# Patient Record
Sex: Female | Born: 1950
Health system: Southern US, Community
[De-identification: ages and names within clinical notes are randomized; demographics above are authoritative.]

## PROBLEM LIST (undated history)

## (undated) DIAGNOSIS — I2729 Other secondary pulmonary hypertension: Secondary | ICD-10-CM

## (undated) DIAGNOSIS — Z86010 Personal history of colonic polyps: Secondary | ICD-10-CM

## (undated) DIAGNOSIS — M1990 Unspecified osteoarthritis, unspecified site: Secondary | ICD-10-CM

## (undated) DIAGNOSIS — R918 Other nonspecific abnormal finding of lung field: Secondary | ICD-10-CM

## (undated) DIAGNOSIS — I2510 Atherosclerotic heart disease of native coronary artery without angina pectoris: Secondary | ICD-10-CM

## (undated) DIAGNOSIS — K219 Gastro-esophageal reflux disease without esophagitis: Secondary | ICD-10-CM

## (undated) DIAGNOSIS — J449 Chronic obstructive pulmonary disease, unspecified: Secondary | ICD-10-CM

## (undated) DIAGNOSIS — E785 Hyperlipidemia, unspecified: Secondary | ICD-10-CM

## (undated) DIAGNOSIS — I350 Nonrheumatic aortic (valve) stenosis: Secondary | ICD-10-CM

## (undated) DIAGNOSIS — R011 Cardiac murmur, unspecified: Secondary | ICD-10-CM

## (undated) DIAGNOSIS — I10 Essential (primary) hypertension: Secondary | ICD-10-CM

## (undated) DIAGNOSIS — Z952 Presence of prosthetic heart valve: Secondary | ICD-10-CM

## (undated) DIAGNOSIS — I340 Nonrheumatic mitral (valve) insufficiency: Secondary | ICD-10-CM

## (undated) HISTORY — DX: Chronic obstructive pulmonary disease, unspecified: J44.9

## (undated) HISTORY — DX: Gastro-esophageal reflux disease without esophagitis: K21.9

## (undated) HISTORY — DX: Hyperlipidemia, unspecified: E78.5

## (undated) HISTORY — DX: Other secondary pulmonary hypertension: I27.29

## (undated) HISTORY — DX: Nonrheumatic mitral (valve) insufficiency: I34.0

## (undated) HISTORY — DX: Cardiac murmur, unspecified: R01.1

## (undated) HISTORY — PX: CATARACT EXTRACTION W/ INTRAOCULAR LENS IMPLANT: SHX1309

## (undated) HISTORY — PX: COLONOSCOPY: SHX174

## (undated) HISTORY — PX: CHOLECYSTECTOMY OPEN: SUR202

## (undated) HISTORY — DX: Personal history of colonic polyps: Z86.010

## (undated) HISTORY — DX: Atherosclerotic heart disease of native coronary artery without angina pectoris: I25.10

## (undated) HISTORY — PX: APPENDECTOMY: SHX54

## (undated) HISTORY — PX: FRACTURE SURGERY: SHX138

## (undated) HISTORY — PX: CARDIAC VALVE REPLACEMENT: SHX585

## (undated) HISTORY — DX: Essential (primary) hypertension: I10

---
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
## 1997-08-04 ENCOUNTER — Ambulatory Visit (HOSPITAL_COMMUNITY): Admission: RE | Admit: 1997-08-04 | Discharge: 1997-08-04 | Payer: Self-pay | Admitting: Family Medicine

## 1999-02-06 ENCOUNTER — Other Ambulatory Visit: Admission: RE | Admit: 1999-02-06 | Discharge: 1999-02-06 | Payer: Self-pay | Admitting: Family Medicine

## 1999-03-06 ENCOUNTER — Ambulatory Visit (HOSPITAL_COMMUNITY): Admission: RE | Admit: 1999-03-06 | Discharge: 1999-03-06 | Payer: Self-pay | Admitting: Family Medicine

## 1999-03-06 ENCOUNTER — Encounter: Payer: Self-pay | Admitting: Family Medicine

## 1999-03-23 ENCOUNTER — Ambulatory Visit (HOSPITAL_COMMUNITY): Admission: RE | Admit: 1999-03-23 | Discharge: 1999-03-23 | Payer: Self-pay | Admitting: Gynecology

## 1999-03-23 ENCOUNTER — Encounter (INDEPENDENT_AMBULATORY_CARE_PROVIDER_SITE_OTHER): Payer: Self-pay | Admitting: Specialist

## 2000-02-04 ENCOUNTER — Other Ambulatory Visit: Admission: RE | Admit: 2000-02-04 | Discharge: 2000-02-04 | Payer: Self-pay | Admitting: Gynecology

## 2000-02-11 ENCOUNTER — Encounter: Payer: Self-pay | Admitting: Gynecology

## 2000-02-11 ENCOUNTER — Encounter: Admission: RE | Admit: 2000-02-11 | Discharge: 2000-02-11 | Payer: Self-pay | Admitting: Gynecology

## 2000-03-11 ENCOUNTER — Ambulatory Visit (HOSPITAL_COMMUNITY): Admission: RE | Admit: 2000-03-11 | Discharge: 2000-03-11 | Payer: Self-pay | Admitting: Gynecology

## 2000-03-11 ENCOUNTER — Encounter: Payer: Self-pay | Admitting: Gynecology

## 2001-02-23 ENCOUNTER — Other Ambulatory Visit: Admission: RE | Admit: 2001-02-23 | Discharge: 2001-02-23 | Payer: Self-pay | Admitting: Gynecology

## 2001-03-12 ENCOUNTER — Encounter: Payer: Self-pay | Admitting: Gynecology

## 2001-03-12 ENCOUNTER — Ambulatory Visit (HOSPITAL_COMMUNITY): Admission: RE | Admit: 2001-03-12 | Discharge: 2001-03-12 | Payer: Self-pay | Admitting: Gynecology

## 2001-10-13 ENCOUNTER — Encounter (INDEPENDENT_AMBULATORY_CARE_PROVIDER_SITE_OTHER): Payer: Self-pay | Admitting: *Deleted

## 2001-10-13 ENCOUNTER — Ambulatory Visit (HOSPITAL_COMMUNITY): Admission: RE | Admit: 2001-10-13 | Discharge: 2001-10-13 | Payer: Self-pay | Admitting: Gastroenterology

## 2004-09-10 ENCOUNTER — Emergency Department (HOSPITAL_COMMUNITY): Admission: EM | Admit: 2004-09-10 | Discharge: 2004-09-10 | Payer: Self-pay | Admitting: Emergency Medicine

## 2006-03-11 ENCOUNTER — Other Ambulatory Visit: Admission: RE | Admit: 2006-03-11 | Discharge: 2006-03-11 | Payer: Self-pay | Admitting: Internal Medicine

## 2006-03-22 ENCOUNTER — Inpatient Hospital Stay (HOSPITAL_COMMUNITY): Admission: EM | Admit: 2006-03-22 | Discharge: 2006-03-25 | Payer: Self-pay | Admitting: Emergency Medicine

## 2007-02-04 ENCOUNTER — Ambulatory Visit (HOSPITAL_BASED_OUTPATIENT_CLINIC_OR_DEPARTMENT_OTHER): Admission: RE | Admit: 2007-02-04 | Discharge: 2007-02-04 | Payer: Self-pay | Admitting: Orthopedic Surgery

## 2009-03-25 HISTORY — PX: FRACTURE SURGERY: SHX138

## 2009-08-20 ENCOUNTER — Inpatient Hospital Stay (HOSPITAL_COMMUNITY): Admission: EM | Admit: 2009-08-20 | Discharge: 2009-08-22 | Payer: Self-pay | Admitting: Emergency Medicine

## 2009-10-23 ENCOUNTER — Encounter: Admission: RE | Admit: 2009-10-23 | Discharge: 2009-11-15 | Payer: Self-pay | Admitting: Orthopedic Surgery

## 2009-11-28 ENCOUNTER — Ambulatory Visit (HOSPITAL_COMMUNITY): Admission: RE | Admit: 2009-11-28 | Discharge: 2009-11-29 | Payer: Self-pay | Admitting: Orthopedic Surgery

## 2009-12-13 ENCOUNTER — Encounter
Admission: RE | Admit: 2009-12-13 | Discharge: 2010-03-13 | Payer: Self-pay | Source: Home / Self Care | Attending: Orthopedic Surgery | Admitting: Orthopedic Surgery

## 2010-06-07 LAB — TYPE AND SCREEN: ABO/RH(D): A NEG

## 2010-06-08 LAB — BASIC METABOLIC PANEL
Calcium: 9.7 mg/dL (ref 8.4–10.5)
Chloride: 107 mEq/L (ref 96–112)
Glucose, Bld: 90 mg/dL (ref 70–99)
Sodium: 141 mEq/L (ref 135–145)

## 2010-06-08 LAB — URINALYSIS, ROUTINE W REFLEX MICROSCOPIC
Bilirubin Urine: NEGATIVE
Hgb urine dipstick: NEGATIVE
Nitrite: NEGATIVE
Protein, ur: NEGATIVE mg/dL
Specific Gravity, Urine: 1.013 (ref 1.005–1.030)

## 2010-06-08 LAB — CBC
HCT: 40.7 % (ref 36.0–46.0)
MCHC: 33.5 g/dL (ref 30.0–36.0)
MCV: 92 fL (ref 78.0–100.0)
Platelets: 245 10*3/uL (ref 150–400)
RBC: 4.43 MIL/uL (ref 3.87–5.11)
WBC: 10.6 10*3/uL — ABNORMAL HIGH (ref 4.0–10.5)

## 2010-06-08 LAB — SURGICAL PCR SCREEN
MRSA, PCR: NEGATIVE
Staphylococcus aureus: NEGATIVE

## 2010-06-08 LAB — DIFFERENTIAL
Basophils Absolute: 0.1 10*3/uL (ref 0.0–0.1)
Eosinophils Absolute: 0.6 10*3/uL (ref 0.0–0.7)
Eosinophils Relative: 5 % (ref 0–5)
Lymphs Abs: 2.9 10*3/uL (ref 0.7–4.0)
Neutro Abs: 6.4 10*3/uL (ref 1.7–7.7)

## 2010-06-08 LAB — APTT: aPTT: 36 seconds (ref 24–37)

## 2010-06-08 LAB — PROTIME-INR: Prothrombin Time: 13.4 seconds (ref 11.6–15.2)

## 2010-06-11 LAB — DIFFERENTIAL
Basophils Relative: 0 % (ref 0–1)
Eosinophils Absolute: 0.1 10*3/uL (ref 0.0–0.7)
Lymphs Abs: 2 10*3/uL (ref 0.7–4.0)
Monocytes Absolute: 0.6 10*3/uL (ref 0.1–1.0)
Neutro Abs: 16.3 10*3/uL — ABNORMAL HIGH (ref 1.7–7.7)

## 2010-06-11 LAB — BASIC METABOLIC PANEL
BUN: 11 mg/dL (ref 6–23)
BUN: 9 mg/dL (ref 6–23)
CO2: 23 mEq/L (ref 19–32)
Calcium: 8.5 mg/dL (ref 8.4–10.5)
Chloride: 107 mEq/L (ref 96–112)
Creatinine, Ser: 0.72 mg/dL (ref 0.4–1.2)
GFR calc Af Amer: >60 mL/min (ref >60)
GFR calc non Af Amer: >60 mL/min (ref >60)
Potassium: 3.6 mEq/L (ref 3.5–5.1)
Sodium: 133 mEq/L — ABNORMAL LOW (ref 135–145)

## 2010-06-11 LAB — URINALYSIS, ROUTINE W REFLEX MICROSCOPIC
Glucose, UA: NEGATIVE mg/dL
Ketones, ur: NEGATIVE mg/dL
Protein, ur: NEGATIVE mg/dL
Urobilinogen, UA: 0.2 mg/dL (ref 0.0–1.0)

## 2010-06-11 LAB — SAMPLE TO BLOOD BANK

## 2010-06-11 LAB — CBC
HCT: 33.6 % — ABNORMAL LOW (ref 36.0–46.0)
HCT: 38 % (ref 36.0–46.0)
Hemoglobin: 12.5 g/dL (ref 12.0–15.0)
MCHC: 33.5 g/dL (ref 30.0–36.0)
MCHC: 34 g/dL (ref 30.0–36.0)
MCV: 92.8 fL (ref 78.0–100.0)
MCV: 93.9 fL (ref 78.0–100.0)
MCV: 94.2 fL (ref 78.0–100.0)
Platelets: 190 10*3/uL (ref 150–400)
Platelets: 235 10*3/uL (ref 150–400)
WBC: 10.3 10*3/uL (ref 4.0–10.5)
WBC: 14.1 10*3/uL — ABNORMAL HIGH (ref 4.0–10.5)

## 2010-06-11 LAB — APTT: aPTT: 33 seconds (ref 24–37)

## 2010-06-11 LAB — URINE CULTURE: Culture: NO GROWTH

## 2010-08-07 NOTE — Op Note (Signed)
NAMECLARABEL, Alexandra [name]                ACCOUNT NO.:  [id number]   MEDICAL RECORD NO.:  [id number]          PATIENT TYPE:  AMB   LOCATION:  [facility]                         FACILITY:  [facility]   PHYSICIAN:  Ollen Gross, M.D.    DATE OF BIRTH:  1950/06/07   DATE OF PROCEDURE:  02/04/2007  DATE OF DISCHARGE:                               OPERATIVE REPORT   PREOPERATIVE DIAGNOSIS:  Painful hardware, right ankle.   POSTOPERATIVE DIAGNOSIS:  Painful hardware, right ankle.   PROCEDURE:  Right ankle hardware removal.   SURGEON:  Ollen Gross, M.D.   ASSISTANT:  None.   ANESTHESIA:  General.   ESTIMATED BLOOD LOSS:  Minimal.   DRAINS:  None.   TOURNIQUET TIME:  14 minutes at 350 mmHg.   COMPLICATIONS:  None.   CONDITION:  Stable to recovery.   BRIEF CLINICAL NOTE:  Ms. Mom is a 60 year old female had a bad tib-  fib fracture about 10 months ago.  She had intramedullary nailing of her  tibia as well as plate fixation of her lateral malleolus.  She healed  uneventfully.  She has been having a lot of pain related to the hardware  both medial and lateral.  She is tender over the lateral plate and  tender over the medial distal interlock screws for the tibial nail.  She  presents now for hardware removal.   PROCEDURE IN DETAIL:  After the successful administration of general  anesthetic, tourniquet placed high on the right thigh, right lower  extremity prepped and draped in the usual sterile fashion.  Extremity  was wrapped in Esmarch and tourniquet inflated 350 mmHg.  I addressed  the medial side first cutting through her previous tiny medial incisions  distally in the lower leg just proximal to the medial malleolus.  Skin  cut with a 15 blade through subcutaneous tissue and the screws are  palpable.  Each screw was removed with the screwdriver for the Ace  tibial nail.  I then addressed the lateral side and going through the  previous incision on the lateral plate.  Six screws were  identified and  removed and then the plate is removed.  There is a tiny bit of metal  stained tissue which is also  excised.  I copiously irrigated both the medial and lateral wounds,  closed subcu with interrupted 2-0 Vicryl and skin with staples.  The  tourniquet was then released with total time of 14 minutes.  Bulky  sterile dressing is applied.  She is awakened and transported to  recovery in stable condition.      Ollen Gross, M.D.  Electronically Signed     FA/MEDQ  D:  02/04/2007  T:  02/05/2007  Job:  [id number]

## 2010-08-10 NOTE — Procedures (Signed)
[facility]  Patient:    Alexandra [name], Alexandra [name] Visit Number: [id number] MRN: [id number]          Service Type: END Location: ENDO Attending Physician:  Louie Bun Dictated by:   Everardo All Madilyn Fireman, M.D. Admit Date:  10/13/2001 Discharge Date: 10/13/2001   CC:         Dr. Lesle Chris   Procedure Report  PROCEDURE:  Colonoscopy with polypectomy.  SURGEON:  John C. Madilyn Fireman, M.D.  INDICATIONS FOR PROCEDURE:  Heme positive stool.  DESCRIPTION OF PROCEDURE:  The patient was placed in the left lateral decubitus position and placed on the pulse monitor with continuous low flow oxygen delivered by nasal cannula.  She was sedated with 120 mg of IV Demerol and 12 mg of IV Versed.  The Olympus video colonoscope was inserted into the rectum and advanced to the cecum, confirmed by transillumination at McBurneys point, and visualization of the ileocecal valve and appendiceal orifice.  The prep was good.  The cecum, ascending, transverse, descending, and sigmoid colon all appeared normal with no masses, polyps, diverticula, or other mucosal abnormalities.  Within the rectum, there was seen an 8 mm sessile polyp at 20 cm and this was fulgurated by hot biopsy.  The remainder of the rectum appeared normal.  The scope was then withdrawn and the patient returned to the recovery room in stable condition.  She tolerated the procedure well and there were no immediate complications.  IMPRESSION:  An 8 mm rectal polyp, fulgurated by hot biopsy.  PLAN:  Will await histology to determine method and interval for future colon screening, and consider repeating Hemoccults in three to four weeks. Dictated by:   Everardo All Madilyn Fireman, M.D. Attending Physician:  Louie Bun DD:  10/13/01 TD:  10/16/01 Job: [id number]

## 2010-08-10 NOTE — Discharge Summary (Signed)
Alexandra [name]                ACCOUNT NO.:  [id number]   MEDICAL RECORD NO.:  [id number]          PATIENT TYPE:  INP   LOCATION:  1512                         FACILITY:  [facility]   PHYSICIAN:  Ollen Gross, M.D.    DATE OF BIRTH:  [birth year]   DATE OF ADMISSION:  03/22/2006  DATE OF DISCHARGE:  03/25/2006                               DISCHARGE SUMMARY   ADMISSION DIAGNOSES:  1. Right tibia/fibula fracture.  2. Hypertension.   DISCHARGE DIAGNOSES:  1. Right tibial diaphyseal fracture with right distal fibula fracture,      status post intramedullary nailing, right tibial and an open      reduction, internal fixation of right fibula.  2. Hypertension.   PROCEDURE:  On March 22, 2006, an intramedullary nailing of right  tibia with an ORIF of right fibula.  Surgeon was Dr. Ollen Gross,  assistant was Alexandra [name], P.A.-C.   CONSULTATIONS:  None.   HISTORY:  Alexandra [name] is a 60 year old female who tripped over her dog at  her house on the date of admission, sustaining immediate pain and  subsequent fracture.  She was seen in the emergency room and found to  have fractures and admitted for surgical intervention.   LABORATORY DATA:  CBC:  Hemoglobin 13.8, hematocrit 40.7.  PT on  admission 14.3, INR 1.1.  Pro-time was followed.  the INR before  discharge was 1.5.  BMET on admission all within normal limits with the  exception of minimally elevated glucose of 115.   A preoperative electrocardiogram on March 22, 2006, revealed a normal  sinus rhythm, normal electrocardiogram.   Preoperative films showed a distal tibial fracture, slightly displaced,  with a comminuted distal fibula fracture.  Intraoperative films showed  expected parous status, post ORIF of right distal fibula and tibia  fractures.   HOSPITAL COURSE:  The patient was admitted to [facility] for  the above-stated problem.  She was later taken to the operating room  later that evening,  and underwent the above procedure without  complications.  The patient tolerated the procedure well.  She was later  transferred to the recovery room and then to the orthopedic floor.  She  was started on a PCA and then p.o. pain control following surgery.  By  day one postoperatively PT got involved.  Did elevate the leg on  pillows.  Recommended ice and elevation.  Started getting up with PT, up  out of bed.  Slowly increased mobility.  By day two she was weaned over  to p.o. medications.  The PCA was discontinued.  The splint looked good.  She had excellent urinary output.  From a therapy standpoint, she  actually got up and walked about 90 feet that day.   Discharge planning assisted in arrangements of home care.  She  progressed well and was ready to go home by the following day of March 25, 2006.   DISPOSITION:  The patient is discharged home on March 25, 2006.   DISCHARGE MEDICATIONS:  1. Dilaudid p.o.  2. Valium.  3. Coumadin.  DIET:  As tolerated.   ACTIVITY:  She is status weightbearing on her right lower extremity.  Elevate and ice when not up ambulating.  Home PT and home nursing.   FOLLOWUP:  Follow up in two weeks.   CONDITION ON DISCHARGE:  Improved.      Alexandra [name], P.A.      Ollen Gross, M.D.  Electronically Signed    ALP/MEDQ  D:  05/06/2006  T:  05/06/2006  Job:  [id number]

## 2010-08-10 NOTE — Op Note (Signed)
NAMESUMAYYAH, CUSTODIO                ACCOUNT NO.:  [id number]   MEDICAL RECORD NO.:  [id number]          PATIENT TYPE:  INP   LOCATION:  0103                         FACILITY:  [facility]   PHYSICIAN:  Ollen Gross, M.D.    DATE OF BIRTH:  05-Feb-1951   DATE OF PROCEDURE:  03/22/2006  DATE OF DISCHARGE:                               OPERATIVE REPORT   PREOPERATIVE DIAGNOSIS:  Right tibia diaphyseal fracture with right  distal fibula fracture.   POSTOPERATIVE DIAGNOSIS:  Right tibia diaphyseal fracture with right  distal fibula fracture.   PROCEDURES:  1. Intramedullary nailing right tibia.  2. Open reduction and internal fixation right fibula.   SURGEON:  Dr. Lequita Halt.   ASSISTANT:  Avel Peace.   ANESTHESIA:  General.   ESTIMATED BLOOD LOSS:  Minimal.   DRAINS:  None.   TOURNIQUET TIME:  57 minutes at 300 mmHg.   COMPLICATIONS:  None.   CONDITION:  Stable to recovery.   BRIEF CLINICAL NOTE:  Ms. Kamen is a 60 year old female who tripped  over her dog at her house today and fell, sustaining immediate pain and  deformity to the right lower leg.  She had a comminuted fracture of the  tibia, the junction of the middle and distal third, as well as a lateral  malleolar fracture which was displaced and comminuted.  She presents now  for open reduction and internal fixation of fibula and intramedullary  nailing of tibia.   PROCEDURE IN DETAIL:  After successful administration of general  anesthetic, tourniquet was placed high on the right thigh and right  lower extremity prepped and draped in usual sterile fashion.  Right  lower extremity was wrapped in Esmarch, tourniquet inflated to 300 mmHg.  Incision was made medial to the patellar tendon.  The skin was cut with  10 blade through subcutaneous tissue and then a small incision made just  adjacent to the medial border of the patellar tendon.  Guide pin for the  tibial nail was placed so as to enter the center of the proximal  tibia.  Pin is passed and shown to be going directly down the medullary canal.  Proximal reamer was passed over the guide pin.  Guide pin was removed  and then a long beaded guide wire is placed.  It is passed across the  fracture site and into the distal fragment and confirmed to be in the  distal fragment, both on AP and lateral views.  We reamed up to 12 mm  for placement of an 11 mm nail.  The length was 34.5 cm.  The Ace tibial  nail was then passed over the guide pin and impacted so that the tip of  the nail was at the physeal scar of the distal tibia.  Through the  proximal guide, the oblique proximal screw was passed and this was 50 mm  in length.  This passed through a small incision.  We obtained excellent  alignment of this comminuted fracture, both on AP and lateral planes.  Rotation is restored back to her normal rotation.  On the lateral view  we then passed two distal interlocks, using a freehand technique through  small incisions.  They are found be through the nail on the lateral view  and of appropriate length on the AP view.  At this point tibial nail  portion is completed.  I then made an incision over the lateral  malleolus with the 10 blade, through subcutaneous tissue to the lateral  malleolus.  The patient had a comminuted fracture with significant  comminution at the fracture site.  We were able to obtain excellent  alignment, but due to the gross comminution could not get perfect  reduction.  I placed a 7-hole, 1/3 tubular tibial plate, putting one  proximal cortical screw and one distal cancellus screw and took multiple  views, showing excellent alignment on AP and lateral planes.  We placed  two more proximal cortical screws and one more distal cortical and one  more distal cancellus screw; a total six screws through the plate.  I  was very pleased with the alignment on AP and lateral views.  The wounds  were then copiously irrigated with saline solution.   Subcutaneous  tissues closed interrupted 2-0 Vicryl and tourniquet released with total  time of 57 minutes.  Skin was closed with staples and a bulky sterile  dressing and posterior splint were applied.  She was then awakened and  transferred to recovery in stable condition.      Ollen Gross, M.D.  Electronically Signed     FA/MEDQ  D:  03/22/2006  T:  03/23/2006  Job:  [id number]

## 2010-08-21 ENCOUNTER — Emergency Department (HOSPITAL_COMMUNITY): Payer: No Typology Code available for payment source

## 2010-08-21 ENCOUNTER — Emergency Department (HOSPITAL_COMMUNITY)
Admission: EM | Admit: 2010-08-21 | Discharge: 2010-08-21 | Disposition: A | Discharge status: Home / Self Care | Payer: No Typology Code available for payment source | Attending: Emergency Medicine | Admitting: Emergency Medicine

## 2010-08-21 DIAGNOSIS — S20219A Contusion of unspecified front wall of thorax, initial encounter: Secondary | ICD-10-CM | POA: Insufficient documentation

## 2010-08-21 DIAGNOSIS — R079 Chest pain, unspecified: Secondary | ICD-10-CM | POA: Insufficient documentation

## 2010-08-21 LAB — CK TOTAL AND CKMB (NOT AT ARMC)
Relative Index: INVALID (ref 0.0–2.5)
Total CK: 72 U/L (ref 7–177)

## 2010-08-24 ENCOUNTER — Ambulatory Visit
Admission: RE | Admit: 2010-08-24 | Discharge: 2010-08-24 | Disposition: A | Discharge status: Home / Self Care | Payer: No Typology Code available for payment source | Source: Ambulatory Visit | Attending: Family Medicine | Admitting: Family Medicine

## 2010-08-24 ENCOUNTER — Other Ambulatory Visit: Payer: Self-pay | Admitting: Family Medicine

## 2010-08-24 DIAGNOSIS — R509 Fever, unspecified: Secondary | ICD-10-CM

## 2012-12-10 ENCOUNTER — Ambulatory Visit
Admission: RE | Admit: 2012-12-10 | Discharge: 2012-12-10 | Disposition: A | Discharge status: Home / Self Care | Payer: Medicaid Other | Source: Ambulatory Visit | Attending: Family Medicine | Admitting: Family Medicine

## 2012-12-10 ENCOUNTER — Other Ambulatory Visit: Payer: Self-pay | Admitting: Family Medicine

## 2012-12-10 ENCOUNTER — Other Ambulatory Visit: Payer: Self-pay | Admitting: *Deleted

## 2012-12-10 DIAGNOSIS — R55 Syncope and collapse: Secondary | ICD-10-CM

## 2012-12-10 DIAGNOSIS — R002 Palpitations: Secondary | ICD-10-CM

## 2012-12-10 DIAGNOSIS — I10 Essential (primary) hypertension: Secondary | ICD-10-CM

## 2012-12-10 DIAGNOSIS — I359 Nonrheumatic aortic valve disorder, unspecified: Secondary | ICD-10-CM

## 2012-12-11 ENCOUNTER — Other Ambulatory Visit: Payer: Self-pay

## 2012-12-14 ENCOUNTER — Ambulatory Visit
Admission: RE | Admit: 2012-12-14 | Discharge: 2012-12-14 | Disposition: A | Discharge status: Home / Self Care | Payer: Medicaid Other | Source: Ambulatory Visit | Attending: Family Medicine | Admitting: Family Medicine

## 2012-12-14 DIAGNOSIS — R55 Syncope and collapse: Secondary | ICD-10-CM

## 2012-12-17 ENCOUNTER — Encounter: Payer: Self-pay | Admitting: *Deleted

## 2012-12-17 ENCOUNTER — Encounter (INDEPENDENT_AMBULATORY_CARE_PROVIDER_SITE_OTHER): Payer: Medicaid Other

## 2012-12-17 ENCOUNTER — Ambulatory Visit (HOSPITAL_COMMUNITY): Payer: Medicaid Other | Attending: Cardiology | Admitting: Radiology

## 2012-12-17 DIAGNOSIS — R55 Syncope and collapse: Secondary | ICD-10-CM

## 2012-12-17 DIAGNOSIS — I059 Rheumatic mitral valve disease, unspecified: Secondary | ICD-10-CM | POA: Insufficient documentation

## 2012-12-17 DIAGNOSIS — I359 Nonrheumatic aortic valve disorder, unspecified: Secondary | ICD-10-CM | POA: Insufficient documentation

## 2012-12-17 DIAGNOSIS — I10 Essential (primary) hypertension: Secondary | ICD-10-CM

## 2012-12-17 DIAGNOSIS — I379 Nonrheumatic pulmonary valve disorder, unspecified: Secondary | ICD-10-CM | POA: Insufficient documentation

## 2012-12-17 DIAGNOSIS — I079 Rheumatic tricuspid valve disease, unspecified: Secondary | ICD-10-CM | POA: Insufficient documentation

## 2012-12-17 DIAGNOSIS — F17200 Nicotine dependence, unspecified, uncomplicated: Secondary | ICD-10-CM | POA: Insufficient documentation

## 2012-12-17 NOTE — Progress Notes (Signed)
Echocardiogram performed.  

## 2012-12-17 NOTE — Progress Notes (Signed)
Patient ID: Alexandra [name], female   DOB: 17-Jul-1950, 62 y.o.   MRN: [id number] E-Cardio 24 hour holter monitor applied to patient.

## 2012-12-19 ENCOUNTER — Ambulatory Visit
Admission: RE | Admit: 2012-12-19 | Discharge: 2012-12-19 | Disposition: A | Discharge status: Home / Self Care | Payer: Medicaid Other | Source: Ambulatory Visit | Attending: Family Medicine | Admitting: Family Medicine

## 2014-04-11 DIAGNOSIS — I10 Essential (primary) hypertension: Secondary | ICD-10-CM | POA: Diagnosis not present

## 2014-04-11 DIAGNOSIS — G894 Chronic pain syndrome: Secondary | ICD-10-CM | POA: Diagnosis not present

## 2014-04-11 DIAGNOSIS — Z23 Encounter for immunization: Secondary | ICD-10-CM | POA: Diagnosis not present

## 2014-04-11 DIAGNOSIS — M160 Bilateral primary osteoarthritis of hip: Secondary | ICD-10-CM | POA: Diagnosis not present

## 2014-04-11 DIAGNOSIS — F17200 Nicotine dependence, unspecified, uncomplicated: Secondary | ICD-10-CM | POA: Diagnosis not present

## 2014-05-12 DIAGNOSIS — F419 Anxiety disorder, unspecified: Secondary | ICD-10-CM | POA: Diagnosis not present

## 2014-05-12 DIAGNOSIS — F17200 Nicotine dependence, unspecified, uncomplicated: Secondary | ICD-10-CM | POA: Diagnosis not present

## 2014-05-12 DIAGNOSIS — G894 Chronic pain syndrome: Secondary | ICD-10-CM | POA: Diagnosis not present

## 2014-05-12 DIAGNOSIS — I10 Essential (primary) hypertension: Secondary | ICD-10-CM | POA: Diagnosis not present

## 2014-06-09 DIAGNOSIS — M160 Bilateral primary osteoarthritis of hip: Secondary | ICD-10-CM | POA: Diagnosis not present

## 2014-06-09 DIAGNOSIS — I10 Essential (primary) hypertension: Secondary | ICD-10-CM | POA: Diagnosis not present

## 2014-06-09 DIAGNOSIS — G894 Chronic pain syndrome: Secondary | ICD-10-CM | POA: Diagnosis not present

## 2014-06-09 DIAGNOSIS — F17210 Nicotine dependence, cigarettes, uncomplicated: Secondary | ICD-10-CM | POA: Diagnosis not present

## 2014-07-11 DIAGNOSIS — S60940S Unspecified superficial injury of right index finger, sequela: Secondary | ICD-10-CM | POA: Diagnosis not present

## 2014-07-11 DIAGNOSIS — L089 Local infection of the skin and subcutaneous tissue, unspecified: Secondary | ICD-10-CM | POA: Diagnosis not present

## 2014-07-11 DIAGNOSIS — B369 Superficial mycosis, unspecified: Secondary | ICD-10-CM | POA: Diagnosis not present

## 2014-07-11 DIAGNOSIS — G894 Chronic pain syndrome: Secondary | ICD-10-CM | POA: Diagnosis not present

## 2014-07-11 DIAGNOSIS — F17210 Nicotine dependence, cigarettes, uncomplicated: Secondary | ICD-10-CM | POA: Diagnosis not present

## 2014-07-29 ENCOUNTER — Ambulatory Visit (INDEPENDENT_AMBULATORY_CARE_PROVIDER_SITE_OTHER): Payer: Medicare HMO

## 2014-07-29 ENCOUNTER — Ambulatory Visit (INDEPENDENT_AMBULATORY_CARE_PROVIDER_SITE_OTHER): Payer: Medicare HMO | Admitting: Internal Medicine

## 2014-07-29 VITALS — BP 144/72 | HR 74 | Temp 97.9°F | Resp 20 | Ht 66.0 in | Wt 157.1 lb

## 2014-07-29 DIAGNOSIS — M79644 Pain in right finger(s): Secondary | ICD-10-CM | POA: Diagnosis not present

## 2014-07-29 DIAGNOSIS — I998 Other disorder of circulatory system: Secondary | ICD-10-CM

## 2014-07-29 DIAGNOSIS — I999 Unspecified disorder of circulatory system: Secondary | ICD-10-CM

## 2014-07-29 DIAGNOSIS — I080 Rheumatic disorders of both mitral and aortic valves: Secondary | ICD-10-CM

## 2014-07-29 DIAGNOSIS — E785 Hyperlipidemia, unspecified: Secondary | ICD-10-CM | POA: Diagnosis not present

## 2014-07-29 DIAGNOSIS — I10 Essential (primary) hypertension: Secondary | ICD-10-CM | POA: Diagnosis not present

## 2014-07-29 MED ORDER — OXYCODONE-ACETAMINOPHEN 10-325 MG PO TABS: 1.0000 | ORAL_TABLET | Freq: Four times a day (QID) | ORAL | Status: DC | PRN

## 2014-07-29 NOTE — Progress Notes (Signed)
Subjective:    Patient ID: Alexandra [name], female    DOB: 01/29/1951, 64 y.o.   MRN: [id number] This chart was scribed for Tami Lin, MD by Zola Button, Medical Scribe. This patient was seen in Room 7 and the patient's care was started at 3:11 PM.   HPI HPI Comments: Alexandra [name] is a 64 y.o. female with a hx of HTN who presents to the [facility] complaining of sudden onset, worsening right 2nd finger pain that started 4-5 weeks ago but has worsened recently. Patient states she may have foreign object between the fingernail and the nail bed, possibly a thorn from a rosebush. She notes the finger has been cold, and the fingertip occasionally turns a dark color. She has gone to her PCP and received two medications, including antibiotics (doxycycline). Patient states she stopped taking one of the medications due to feeling near-syncopal, but she is still taking the antibiotics. She has Percocet 5 mg which is not enough to help the pain. She has not noticed any changes to her finger while taking the antibiotics. Patient states she is on two different medications for her blood pressure. She denies any trouble with her feet and feet swelling.  Nonsmoker No history of Raynauds  Past medical history includes moderate aortic stenosis with insufficiency but no cardiac symptoms to date Underlying hypertension and hyperlipidemia  Medications include Cozaar and Ziac  Review of Systems No chest pain or palpitations No peripheral edema No headache or vision changes No other joint swollen or red No fever    Objective:   Physical Exam  Constitutional: She is oriented to person, place, and time. She appears well-developed and well-nourished. No distress.  HENT:  Head: Normocephalic and atraumatic.  Mouth/Throat: Oropharynx is clear and moist. No oropharyngeal exudate.  Eyes: Pupils are equal, round, and reactive to light.  Neck: Neck supple.  Cardiovascular: Normal rate  and regular rhythm.   Grade 3/6 systolic ejection murmur right sternal border. No diastolic murmur appreciated. Heart rate slow and regular  Pulmonary/Chest: Effort normal.  Musculoskeletal: She exhibits no edema.  Right index fingertip has the nail separated from the nail bed distally with tender exposed granulation. The fingertip itself is tender everywhere and slightly swollen and cold to the touch without cyanosis. No erythema. There is loss of sensation at the tip to light touch  Neurological: She is alert and oriented to person, place, and time. No cranial nerve deficit.  Skin: Skin is dry. No erythema.  Psychiatric: She has a normal mood and affect. Her behavior is normal.  Nursing note and vitals reviewed. BP 144/72 mmHg  Pulse 74  Temp(Src) 97.9 F (36.6 C) (Oral)  Resp 20  Ht 5\' 6"  (1.676 m)  Wt 157 lb 2 oz (71.271 kg)  BMI 25.37 kg/m2  SpO2 96%  [facility] reading (PRIMARY) by  Dr. Laney Pastor no foreign body and no fracture of fingertip     Assessment & Plan:  Pain in finger of right hand - Plan: DG Finger Index Right, Ambulatory referral to Vascular Surgery Ischemia of finger - Plan: Ambulatory referral to Vascular Surgery  Underlying disease: Aortic stenosis with mitral and aortic insufficiency  Essential hypertension  Hyperlipidemia  Plan It appears that the finger is suffering ischemia and that it has probably been stable over the last week to 10 days since the initial event which included a blackness or darkness of the tip. She clearly doesn't need antibiotics. We will start her on  an aspirin, however do frequent hot soaks to stimulate blood flow, use a higher dose of oxycodone temporarily while awaiting evaluation by vascular surgery.  Meds ordered this encounter  Medications  . oxyCODONE-acetaminophen (PERCOCET) 10-325 MG per tablet    Sig: Take 1 tablet by mouth every 6 (six) hours as needed for pain.    Dispense:  20 tablet    Refill:  0    I have completed  the patient encounter in its entirety as documented by the scribe, with editing by me where necessary. Delories Mauri P. Laney Pastor, M.D.

## 2014-08-01 ENCOUNTER — Other Ambulatory Visit: Payer: Self-pay

## 2014-08-01 ENCOUNTER — Ambulatory Visit (HOSPITAL_COMMUNITY)
Admission: RE | Admit: 2014-08-01 | Discharge: 2014-08-01 | Disposition: A | Discharge status: Home / Self Care | Payer: Medicare HMO | Source: Ambulatory Visit | Attending: Surgery | Admitting: Surgery

## 2014-08-01 ENCOUNTER — Ambulatory Visit (INDEPENDENT_AMBULATORY_CARE_PROVIDER_SITE_OTHER): Payer: Medicare HMO | Admitting: Surgery

## 2014-08-01 ENCOUNTER — Encounter: Payer: Self-pay | Admitting: Surgery

## 2014-08-01 VITALS — BP 128/63 | HR 57 | Resp 14 | Ht 66.0 in | Wt 157.0 lb

## 2014-08-01 DIAGNOSIS — L819 Disorder of pigmentation, unspecified: Secondary | ICD-10-CM

## 2014-08-01 DIAGNOSIS — R209 Unspecified disturbances of skin sensation: Secondary | ICD-10-CM

## 2014-08-01 DIAGNOSIS — M79644 Pain in right finger(s): Secondary | ICD-10-CM

## 2014-08-01 DIAGNOSIS — R208 Other disturbances of skin sensation: Secondary | ICD-10-CM | POA: Diagnosis not present

## 2014-08-01 NOTE — Progress Notes (Signed)
Patient name: Alexandra [name] MRN: [id number] DOB: 05/17/1950 Sex: female   Referred by: Dr. Laney Pastor  Reason for referral:  Chief Complaint  Patient presents with  . New Evaluation    C/O Right index finger numbness and pain, duration 5 wks .  Pain level 10    HISTORY OF PRESENT ILLNESS:  this is a 64 year old female who was referred today for evaluation of pain in her right index finger which has been going on for approximately 4-5 weeks.  She states that she may have had a thorn from a Rosebush gets stuck there. The area is cold and occasionally becomes discolored.  There was concern for embolic disease. She has tried antibiotics but has not had much relief.  She does get some relief from narcotics.   The patient is a former smoker. She is medically managed for hypertension with 2 medications.  Past Medical History  Diagnosis Date  . Hypertension   . Hyperlipidemia     Past Surgical History  Procedure Laterality Date  . Fracture surgery      2007 -right tib /fib fracture ----07/2009 right femur fx after a fall  . Fracture surgery Right 2010    Femur    History   Social History  . Marital Status: Married    Spouse Name: N/A  . Number of Children: N/A  . Years of Education: N/A   Occupational History  . Not on file.   Social History Main Topics  . Smoking status: Never Smoker   . Smokeless tobacco: Never Used  . Alcohol Use: No  . Drug Use: No  . Sexual Activity: Not on file   Other Topics Concern  . Not on file   Social History Narrative    Family History  Problem Relation Age of Onset  . Hypertension Mother     Allergies as of 08/01/2014 - Review Complete 08/01/2014  Allergen Reaction Noted  . Codeine Nausea Only 02/09/2014    Current Outpatient Prescriptions on File Prior to Visit  Medication Sig Dispense Refill  . ALPRAZolam (XANAX) 1 MG tablet Take 1 mg by mouth 3 (three) times daily.  0  . bisoprolol-hydrochlorothiazide (ZIAC) 10-6.25 MG  per tablet   0  . losartan (COZAAR) 100 MG tablet   0  . oxyCODONE-acetaminophen (PERCOCET) 10-325 MG per tablet Take 1 tablet by mouth every 6 (six) hours as needed for pain. 20 tablet 0  . cholecalciferol (VITAMIN D) 1000 UNITS tablet Take 1,000 Units by mouth daily.    . Omega-3 Fatty Acids (FISH OIL) 1200 MG CAPS Take by mouth.    . oxyCODONE-acetaminophen (PERCOCET/ROXICET) 5-325 MG per tablet Take 1 tablet by mouth 3 (three) times daily.  0  . Thiamine HCl (VITAMIN B-1) 250 MG tablet Take 250 mg by mouth daily.     No current facility-administered medications on file prior to visit.     REVIEW OF SYSTEMS: Cardiovascular: No chest pain, chest pressure, palpitations, orthopnea, or dyspnea on exertion. No claudication or rest pain,  No history of DVT or phlebitis. Pulmonary: No productive cough, asthma or wheezing. Neurologic: No weakness, paresthesias, aphasia, or amaurosis. No dizziness. Hematologic: No bleeding problems or clotting disorders. Musculoskeletal: No joint pain or joint swelling. Gastrointestinal: No blood in stool or hematemesis Genitourinary: No dysuria or hematuria. Psychiatric:: No history of major depression. Integumentary: No rashes or ulcers. Constitutional: No fever or chills.   see history of present illness, otherwise review systems is negative  PHYSICAL EXAMINATION:  Filed Vitals:   08/01/14 1457  BP: 128/63  Pulse: 57  Resp: 14  Height: 5\' 6"  (1.676 m)  Weight: 157 lb (71.215 kg)  SpO2: 99%   Body mass index is 25.35 kg/(m^2). General: The patient appears their stated age.   HEENT:  No gross abnormalities Pulmonary: Respirations are non-labored Musculoskeletal: There are no major deformities.   Neurologic: No focal weakness or paresthesias are detected, Skin:  Edematous open area underneath the right index fingernail. Psychiatric: The patient has normal affect. Cardiovascular: There is a regular rate and rhythm  Positive systolic ejection  murmur.  Left carotid bruit versus radiation of her cardiac murmur.  No bruits in the right supraclavicular area. Palpable right radial pulse  Diagnostic Studies:  Doppler studies have been reviewed.  She has triphasic waveforms on the right side in the radial and ulnar arteries. There is turbulent subclavian blood flow and a small plaque just beyond the vertebral artery origin.  She has an occluded left ulnar artery.  There are monophasic waveforms on the left suggestive of proximal disease.   Assessment:   right index finger ulcer Plan:  it is unclear as to the etiology of her right index finger open area.  It could be embolic or it could be secondary to a foreign body. Ultrasound today did not show significant aneurysmal changes or plaque within the right upper extremity arterial tree. I think she needs to be evaluated by a hand surgeon as this area may require exploration surgically.  In order to continue her possible embolic workup, I'm scheduling her for angiography on Tuesday, May 17. I am referring her to a hand surgeon, hopefully to be seen tomorrow     V. Leia Alf, M.D. [facility] Office: [phone] Pager:  [phone]

## 2014-08-02 NOTE — Addendum Note (Signed)
Addended by: Reola Calkins on: 08/02/2014 03:51 PM   Modules accepted: Orders

## 2014-08-09 ENCOUNTER — Ambulatory Visit (HOSPITAL_COMMUNITY)
Admission: RE | Admit: 2014-08-09 | Discharge: 2014-08-09 | Disposition: A | Discharge status: Home / Self Care | Payer: Medicare HMO | Source: Ambulatory Visit | Attending: Surgery | Admitting: Surgery

## 2014-08-09 ENCOUNTER — Encounter (HOSPITAL_COMMUNITY): Payer: Self-pay | Admitting: Surgery

## 2014-08-09 ENCOUNTER — Encounter (HOSPITAL_COMMUNITY)
Admission: RE | Disposition: A | Discharge status: Home / Self Care | Payer: Medicare HMO | Source: Ambulatory Visit | Attending: Surgery

## 2014-08-09 DIAGNOSIS — E785 Hyperlipidemia, unspecified: Secondary | ICD-10-CM | POA: Diagnosis not present

## 2014-08-09 DIAGNOSIS — S61240D Puncture wound with foreign body of right index finger without damage to nail, subsequent encounter: Secondary | ICD-10-CM | POA: Insufficient documentation

## 2014-08-09 DIAGNOSIS — Z87891 Personal history of nicotine dependence: Secondary | ICD-10-CM | POA: Diagnosis not present

## 2014-08-09 DIAGNOSIS — S61208A Unspecified open wound of other finger without damage to nail, initial encounter: Secondary | ICD-10-CM | POA: Diagnosis not present

## 2014-08-09 DIAGNOSIS — X58XXXA Exposure to other specified factors, initial encounter: Secondary | ICD-10-CM | POA: Insufficient documentation

## 2014-08-09 DIAGNOSIS — S61209D Unspecified open wound of unspecified finger without damage to nail, subsequent encounter: Secondary | ICD-10-CM | POA: Diagnosis present

## 2014-08-09 DIAGNOSIS — I10 Essential (primary) hypertension: Secondary | ICD-10-CM | POA: Diagnosis not present

## 2014-08-09 HISTORY — PX: ULTRASOUND GUIDANCE FOR VASCULAR ACCESS: SHX6516

## 2014-08-09 HISTORY — PX: PERIPHERAL VASCULAR CATHETERIZATION: SHX172C

## 2014-08-09 LAB — POCT I-STAT, CHEM 8
BUN: 12 mg/dL (ref 6–20)
CHLORIDE: 107 mmol/L (ref 101–111)
Calcium, Ion: 0.96 mmol/L — ABNORMAL LOW (ref 1.13–1.30)
Creatinine, Ser: 0.8 mg/dL (ref 0.44–1.00)
GLUCOSE: 103 mg/dL — AB (ref 65–99)
HCT: 41 % (ref 36.0–46.0)
Hemoglobin: 13.9 g/dL (ref 12.0–15.0)
Potassium: 3.9 mmol/L (ref 3.5–5.1)
SODIUM: 138 mmol/L (ref 135–145)
TCO2: 22 mmol/L (ref 0–100)

## 2014-08-09 SURGERY — AORTIC ARCH ANGIOGRAPHY
Anesthesia: LOCAL | Laterality: Right

## 2014-08-09 MED ORDER — FENTANYL CITRATE (PF) 100 MCG/2ML IJ SOLN
INTRAMUSCULAR | Status: AC
  Filled 2014-08-09: qty 2

## 2014-08-09 MED ORDER — SODIUM CHLORIDE 0.9 % IV SOLN: 1.0000 mL/kg/h | INTRAVENOUS | Status: DC

## 2014-08-09 MED ORDER — HYDRALAZINE HCL 20 MG/ML IJ SOLN: 5.0000 mg | INTRAMUSCULAR | Status: DC | PRN

## 2014-08-09 MED ORDER — MIDAZOLAM HCL 2 MG/2ML IJ SOLN
INTRAMUSCULAR | Status: DC | PRN
  Administered 2014-08-09: 1 mg via INTRAVENOUS

## 2014-08-09 MED ORDER — ONDANSETRON HCL 4 MG/2ML IJ SOLN: 4.0000 mg | Freq: Four times a day (QID) | INTRAMUSCULAR | Status: DC | PRN

## 2014-08-09 MED ORDER — IODIXANOL 320 MG/ML IV SOLN
INTRAVENOUS | Status: DC | PRN
  Administered 2014-08-09: 110 mL via INTRAVENOUS

## 2014-08-09 MED ORDER — SODIUM CHLORIDE 0.9 % IV SOLN
INTRAVENOUS | Status: DC
  Administered 2014-08-09: 06:00:00 via INTRAVENOUS

## 2014-08-09 MED ORDER — OXYCODONE-ACETAMINOPHEN 5-325 MG PO TABS
1.0000 | ORAL_TABLET | ORAL | Status: DC | PRN
  Filled 2014-08-09: qty 2

## 2014-08-09 MED ORDER — ACETAMINOPHEN 325 MG PO TABS
325.0000 mg | ORAL_TABLET | ORAL | Status: DC | PRN
  Filled 2014-08-09: qty 2

## 2014-08-09 MED ORDER — METOPROLOL TARTRATE 1 MG/ML IV SOLN: 2.0000 mg | INTRAVENOUS | Status: DC | PRN

## 2014-08-09 MED ORDER — GUAIFENESIN-DM 100-10 MG/5ML PO SYRP
15.0000 mL | ORAL_SOLUTION | ORAL | Status: DC | PRN
  Filled 2014-08-09: qty 15

## 2014-08-09 MED ORDER — HEPARIN (PORCINE) IN NACL 2-0.9 UNIT/ML-% IJ SOLN
INTRAMUSCULAR | Status: AC
  Filled 2014-08-09: qty 1000

## 2014-08-09 MED ORDER — FENTANYL CITRATE (PF) 100 MCG/2ML IJ SOLN
INTRAMUSCULAR | Status: DC | PRN
  Administered 2014-08-09: 25 ug via INTRAVENOUS

## 2014-08-09 MED ORDER — MORPHINE SULFATE 10 MG/ML IJ SOLN: 2.0000 mg | INTRAMUSCULAR | Status: DC | PRN

## 2014-08-09 MED ORDER — ACETAMINOPHEN 325 MG RE SUPP
325.0000 mg | RECTAL | Status: DC | PRN
  Filled 2014-08-09: qty 2

## 2014-08-09 MED ORDER — LABETALOL HCL 5 MG/ML IV SOLN: 10.0000 mg | INTRAVENOUS | Status: DC | PRN

## 2014-08-09 MED ORDER — PHENOL 1.4 % MT LIQD
1.0000 | OROMUCOSAL | Status: DC | PRN
  Filled 2014-08-09: qty 177

## 2014-08-09 MED ORDER — ALUM & MAG HYDROXIDE-SIMETH 200-200-20 MG/5ML PO SUSP
15.0000 mL | ORAL | Status: DC | PRN
  Filled 2014-08-09: qty 30

## 2014-08-09 MED ORDER — DOCUSATE SODIUM 100 MG PO CAPS: 100.0000 mg | ORAL_CAPSULE | Freq: Every day | ORAL | Status: DC

## 2014-08-09 MED ORDER — LIDOCAINE HCL (PF) 1 % IJ SOLN
INTRAMUSCULAR | Status: AC
  Filled 2014-08-09: qty 30

## 2014-08-09 MED ORDER — MIDAZOLAM HCL 2 MG/2ML IJ SOLN
INTRAMUSCULAR | Status: AC
  Filled 2014-08-09: qty 2

## 2014-08-09 SURGICAL SUPPLY — 17 items
CATH ANGIO 5F BER2 100CM (CATHETERS) ×4 IMPLANT
COVER PRB 48X5XTLSCP FOLD TPE (BAG) ×3 IMPLANT
COVER PROBE 5X48 (BAG) ×1
DEVICE TORQUE H2O (MISCELLANEOUS) ×4 IMPLANT
DRAPE ZERO GRAVITY STERILE (DRAPES) ×4 IMPLANT
GUIDEWIRE ANGLED .035X150CM (WIRE) ×4 IMPLANT
HAND CONTROLLER AVANTA (MISCELLANEOUS) IMPLANT
KIT MICROINTRODUCER STIFF 5F (SHEATH) ×4 IMPLANT
KIT PV (KITS) ×4 IMPLANT
SET AVANTA MULTI PATIENT (MISCELLANEOUS) IMPLANT
SET AVANTA SINGLE PATIENT (MISCELLANEOUS) IMPLANT
SHEATH AVANTA HAND CONTROLLER (MISCELLANEOUS) IMPLANT
SHEATH PINNACLE 5F 10CM (SHEATH) ×4 IMPLANT
SYR MEDRAD MARK V 150ML (SYRINGE) IMPLANT
TRANSDUCER W/STOPCOCK (MISCELLANEOUS) ×4 IMPLANT
TRAY PV CATH (CUSTOM PROCEDURE TRAY) ×4 IMPLANT
WIRE BENTSON .035X145CM (WIRE) ×4 IMPLANT

## 2014-08-09 NOTE — Discharge Instructions (Signed)

## 2014-08-09 NOTE — Progress Notes (Signed)
Site area: RFA Site Prior to Removal:  Level 0 Pressure Applied For:20 min Manual:  yes  Patient Status During Pull:  Stable Post Pull Site:  Level 0 Post Pull Instructions Given: yes  Post Pull Pulses Present: palpable Dressing Applied:  yes Bedrest begins @ 0910 Comments:

## 2014-08-09 NOTE — Interval H&P Note (Signed)
History and Physical Interval Note:  08/09/2014 7:36 AM  Alexandra [name]  has presented today for surgery, with the diagnosis of pvd  The various methods of treatment have been discussed with the patient and family. After consideration of risks, benefits and other options for treatment, the patient has consented to  Procedure(s): Aortic Arch Angiography (N/A) Upper Extremity Angiography (Right) Ultrasound Guidance For Vascular Access as a surgical intervention .  The patient's history has been reviewed, patient examined, no change in status, stable for surgery.  I have reviewed the patient's chart and labs.  Questions were answered to the patient's satisfaction.     [name]

## 2014-08-09 NOTE — H&P (View-Only) (Signed)
Patient name: Alexandra [name] MRN: [id number] DOB: 20-Sep-1950 Sex: female   Referred by: Dr. Laney Pastor  Reason for referral:  Chief Complaint  Patient presents with  . New Evaluation    C/O Right index finger numbness and pain, duration 5 wks .  Pain level 10    HISTORY OF PRESENT ILLNESS:  this is a 64 year old female who was referred today for evaluation of pain in her right index finger which has been going on for approximately 4-5 weeks.  She states that she may have had a thorn from a [place] gets stuck there. The area is cold and occasionally becomes discolored.  There was concern for embolic disease. She has tried antibiotics but has not had much relief.  She does get some relief from narcotics.   The patient is a former smoker. She is medically managed for hypertension with 2 medications.  Past Medical History  Diagnosis Date  . Hypertension   . Hyperlipidemia     Past Surgical History  Procedure Laterality Date  . Fracture surgery      2007 -right tib /fib fracture ----07/2009 right femur fx after a fall  . Fracture surgery Right 2010    Femur    History   Social History  . Marital Status: Married    Spouse Name: N/A  . Number of Children: N/A  . Years of Education: N/A   Occupational History  . Not on file.   Social History Main Topics  . Smoking status: Never Smoker   . Smokeless tobacco: Never Used  . Alcohol Use: No  . Drug Use: No  . Sexual Activity: Not on file   Other Topics Concern  . Not on file   Social History Narrative    Family History  Problem Relation Age of Onset  . Hypertension Mother     Allergies as of 08/01/2014 - Review Complete 08/01/2014  Allergen Reaction Noted  . Codeine Nausea Only 02/09/2014    Current Outpatient Prescriptions on File Prior to Visit  Medication Sig Dispense Refill  . ALPRAZolam (XANAX) 1 MG tablet Take 1 mg by mouth 3 (three) times daily.  0  . bisoprolol-hydrochlorothiazide (ZIAC) 10-6.25 MG  per tablet   0  . losartan (COZAAR) 100 MG tablet   0  . oxyCODONE-acetaminophen (PERCOCET) 10-325 MG per tablet Take 1 tablet by mouth every 6 (six) hours as needed for pain. 20 tablet 0  . cholecalciferol (VITAMIN D) 1000 UNITS tablet Take 1,000 Units by mouth daily.    . Omega-3 Fatty Acids (FISH OIL) 1200 MG CAPS Take by mouth.    . oxyCODONE-acetaminophen (PERCOCET/ROXICET) 5-325 MG per tablet Take 1 tablet by mouth 3 (three) times daily.  0  . Thiamine HCl (VITAMIN B-1) 250 MG tablet Take 250 mg by mouth daily.     No current facility-administered medications on file prior to visit.     REVIEW OF SYSTEMS: Cardiovascular: No chest pain, chest pressure, palpitations, orthopnea, or dyspnea on exertion. No claudication or rest pain,  No history of DVT or phlebitis. Pulmonary: No productive cough, asthma or wheezing. Neurologic: No weakness, paresthesias, aphasia, or amaurosis. No dizziness. Hematologic: No bleeding problems or clotting disorders. Musculoskeletal: No joint pain or joint swelling. Gastrointestinal: No blood in stool or hematemesis Genitourinary: No dysuria or hematuria. Psychiatric:: No history of major depression. Integumentary: No rashes or ulcers. Constitutional: No fever or chills.   see history of present illness, otherwise review systems is negative  PHYSICAL EXAMINATION:  Filed Vitals:   08/01/14 1457  BP: 128/63  Pulse: 57  Resp: 14  Height: 5\' 6"  (1.676 m)  Weight: 157 lb (71.215 kg)  SpO2: 99%   Body mass index is 25.35 kg/(m^2). General: The patient appears their stated age.   HEENT:  No gross abnormalities Pulmonary: Respirations are non-labored Musculoskeletal: There are no major deformities.   Neurologic: No focal weakness or paresthesias are detected, Skin:  Edematous open area underneath the right index fingernail. Psychiatric: The patient has normal affect. Cardiovascular: There is a regular rate and rhythm  Positive systolic ejection  murmur.  Left carotid bruit versus radiation of her cardiac murmur.  No bruits in the right supraclavicular area. Palpable right radial pulse  Diagnostic Studies:  Doppler studies have been reviewed.  She has triphasic waveforms on the right side in the radial and ulnar arteries. There is turbulent subclavian blood flow and a small plaque just beyond the vertebral artery origin.  She has an occluded left ulnar artery.  There are monophasic waveforms on the left suggestive of proximal disease.   Assessment:   right index finger ulcer Plan:  it is unclear as to the etiology of her right index finger open area.  It could be embolic or it could be secondary to a foreign body. Ultrasound today did not show significant aneurysmal changes or plaque within the right upper extremity arterial tree. I think she needs to be evaluated by a hand surgeon as this area may require exploration surgically.  In order to continue her possible embolic workup, I'm scheduling her for angiography on Tuesday, May 17. I am referring her to a hand surgeon, hopefully to be seen tomorrow     V. Leia Alf, M.D. [facility] Office: [phone] Pager:  [phone]

## 2014-08-10 MED FILL — Heparin Sodium (Porcine) 2 Unit/ML in Sodium Chloride 0.9%: INTRAMUSCULAR | Qty: 1000 | Status: AC

## 2014-08-10 MED FILL — Lidocaine HCl Local Preservative Free (PF) Inj 1%: INTRAMUSCULAR | Qty: 30 | Status: AC

## 2014-08-30 ENCOUNTER — Ambulatory Visit: Payer: Medicare HMO | Admitting: Internal Medicine

## 2014-11-17 DIAGNOSIS — G894 Chronic pain syndrome: Secondary | ICD-10-CM | POA: Diagnosis not present

## 2014-11-17 DIAGNOSIS — E782 Mixed hyperlipidemia: Secondary | ICD-10-CM | POA: Diagnosis not present

## 2014-11-17 DIAGNOSIS — M160 Bilateral primary osteoarthritis of hip: Secondary | ICD-10-CM | POA: Diagnosis not present

## 2014-11-24 DIAGNOSIS — F4320 Adjustment disorder, unspecified: Secondary | ICD-10-CM | POA: Diagnosis not present

## 2014-11-24 DIAGNOSIS — R110 Nausea: Secondary | ICD-10-CM | POA: Diagnosis not present

## 2014-12-19 DIAGNOSIS — M1610 Unilateral primary osteoarthritis, unspecified hip: Secondary | ICD-10-CM | POA: Diagnosis not present

## 2014-12-19 DIAGNOSIS — Z23 Encounter for immunization: Secondary | ICD-10-CM | POA: Diagnosis not present

## 2014-12-19 DIAGNOSIS — I10 Essential (primary) hypertension: Secondary | ICD-10-CM | POA: Diagnosis not present

## 2014-12-19 DIAGNOSIS — G894 Chronic pain syndrome: Secondary | ICD-10-CM | POA: Diagnosis not present

## 2015-05-16 ENCOUNTER — Encounter: Payer: Self-pay | Admitting: Physician Assistant

## 2015-05-16 ENCOUNTER — Ambulatory Visit (INDEPENDENT_AMBULATORY_CARE_PROVIDER_SITE_OTHER): Payer: Medicare Other | Admitting: Physician Assistant

## 2015-05-16 VITALS — BP 136/80 | HR 58 | Temp 97.5°F | Resp 16 | Wt 157.4 lb

## 2015-05-16 DIAGNOSIS — M79604 Pain in right leg: Secondary | ICD-10-CM

## 2015-05-16 DIAGNOSIS — E559 Vitamin D deficiency, unspecified: Secondary | ICD-10-CM | POA: Insufficient documentation

## 2015-05-16 MED ORDER — OXYCODONE-ACETAMINOPHEN 7.5-325 MG PO TABS: 0.5000 | ORAL_TABLET | Freq: Three times a day (TID) | ORAL | Status: DC | PRN

## 2015-05-16 NOTE — Patient Instructions (Signed)
[facility] Policy for Prescribing Controlled Substances (Revised 01/2012) 1. Prescriptions for controlled substances will be filled by ONE provider at [facility] with whom you have established and developed a plan for your care, including follow-up. 2. You are encouraged to schedule an appointment with your prescriber at our appointment center for follow-up visits whenever possible. 3. If you request a prescription for the controlled substance while at [facility] for an acute problem (with someone other than your regular prescriber), you MAY be given a ONE-TIME prescription for a 30-day supply of the controlled substance, to allow time for you to return to see your regular prescriber for additional prescriptions.  If Dr. Marcelino Scot can't see you, I'll make arrangements with a different practice.  If there is no additional orthopedic treatment for your pain, then I will refer you to Pain Management.   I will prescribe the pain medication until then.

## 2015-05-16 NOTE — Progress Notes (Signed)
Patient ID: Alexandra [name], female    DOB: July 05, 1950, 65 y.o.   MRN: [id number]  PCP: Tamsen Roers, MD  Subjective:   Chief Complaint  Patient presents with  . chronic leg pain    wants pain medication    HPI Presents for evaluation of RIGHT lower leg pain since 2012 following removal of a pin in the knee (Dr. Alvan Dame) that was "too big, I couldn't bend my knee." She has hardware from surgical repair of a femur fracture (2010-[facility]) and a tib-fib fracture (2007-[facility]).  Pain extends up from the lower leg into the knee and lower thigh. Can bend the knee pretty well. Colder weather worsens the pain, extended walking (anything more than 15 minutes). Taking pain medication helps it. If it really gets to bothering me, I lay down and prop it up. Last prescription of oxycodone was reportedly filled in November, and she reports that she broke them up so they'd last longer. She took 1/4 tablet to prevent nausea and diarrhea, but it wasn't sufficient for her pain. Hasn't contacted her orthopedic surgeons at [facility] because she felt like they just wanted money every time she went, and didn't want to "take my Medicaid." She saw Milly Jakob, MD at [facility] for a finger issue in 07/2014, which did not require additional treatment at the time.   Is looking for a new PCP. Desires someone younger, faster, "someone different," "would like to have a lady instead of a man." Last visit with him was in early November. She didn't go back to get refills of the oxycodone because she didn't want "to go back down there."  [place] Controlled Substance Database reveals Rxs on 9/26 and 8/28 by Dr. Tamsen Roers for 30-day supplies of oxycodone-APAP 7.5/325 (#90) and alprazolam 1 mg (#90).  She reports that the only medication she is out of is the oxycodone, and that Dr. Rex Kras provided refills of the others at her last visit there in November.    Review of Systems  Constitutional: Negative  for fever and chills.  Respiratory: Negative for shortness of breath.   Cardiovascular: Negative for chest pain and leg swelling.  Gastrointestinal: Negative for nausea, vomiting and diarrhea.  Musculoskeletal: Positive for myalgias, arthralgias and gait problem. Negative for joint swelling.  Skin: Negative for rash and wound.  Neurological: Negative for dizziness, weakness and headaches.       Patient Active Problem List   Diagnosis Date Noted  . Vitamin D deficiency 05/16/2015  . Aortic stenosis with mitral and aortic insufficiency 07/29/2014  . Hypertension 07/29/2014  . Hyperlipidemia 07/29/2014     Prior to Admission medications   Medication Sig Start Date End Date Taking? Authorizing Provider  bisoprolol-hydrochlorothiazide [facility]) 10-6.25 MG per tablet  07/24/14  Yes Historical Provider, MD  losartan (COZAAR) 50 MG tablet Take 50 mg by mouth daily. 05/09/15  Yes Historical Provider, MD  Vitamin D, Cholecalciferol, 1000 units CAPS Take 1,000 Units by mouth daily.   Yes Historical Provider, MD  ALPRAZolam Duanne Moron) 1 MG tablet Take 1 mg by mouth 3 (three) times daily. Reported on 05/16/2015 07/11/14   Historical Provider, MD  oxyCODONE-acetaminophen (PERCOCET) 10-325 MG per tablet Take 1 tablet by mouth every 6 (six) hours as needed for pain. Patient not taking: Reported on 05/16/2015 07/29/14   Leandrew Koyanagi, MD     Allergies  Allergen Reactions  . Codeine Nausea Only       Objective:  Physical Exam  Constitutional: She is oriented to  person, place, and time. She appears well-developed and well-nourished. She is active and cooperative. No distress.  BP 136/80 mmHg  Pulse 58  Temp(Src) 97.5 F (36.4 C) (Oral)  Resp 16  Wt 157 lb 6.4 oz (71.396 kg)   Eyes: Conjunctivae are normal.  Pulmonary/Chest: Effort normal.  Musculoskeletal:  Well-healed scars on the RIGHT knee, consistent with her surgeries. No erythema, edema, induration of the foot, ankle, knee or  thigh. FROM of the ankle without pain. FROM of the knee, with pain at the extremes, and mild crepitus.   Neurological: She is alert and oriented to person, place, and time. She has normal strength. No sensory deficit. Gait (favors the RIGHT leg) abnormal.  Skin: Skin is warm and dry. No rash noted.  Psychiatric: She has a normal mood and affect. Her speech is normal and behavior is normal.           Assessment & Plan:   1. Pain of right leg I advised her that while I am happy to serve as her PCP, this office does not participate with Medicaid, and I do not plan to provide chronic pain management in the long term. She will check with Medicaid to see if she can have Dr. Rex Kras removed as her PCP and leave the field blank. If not, she will need to return to Dr. Rex Kras or seek another practice. If she will come here, we need to get her health maintenance records.  With regard to the leg pain, I advised her to follow-up with orthopedics. Given his expertise with orthopedic trauma, we will see if Dr. Marcelino Scot can see her. If not, will try  [employer] again, or [employer]. She does not desire returning to [facility], unfortunately, since they performed her surgeries.  If it is determined that there is nothing to be done from an orthopedic standpoint, that she will need chronic pain management, I will make such referral. Until this is sorted, I will continue to prescribe the pain medication on a monthly basis. Our controlled substance policy was reviewed with her and provided in print.  - oxyCODONE-acetaminophen (PERCOCET) 7.5-325 MG tablet; Take 0.5-1 tablets by mouth every 8 (eight) hours as needed for severe pain.  Dispense: 90 tablet; Refill: 0 - Ambulatory referral to Orthopedic Surgery   Return in about 3 months (around 08/13/2015) for re-evaluation and health maintenance.    Fara Chute, PA-C Physician Assistant-Certified [facility]

## 2015-05-23 DIAGNOSIS — M25561 Pain in right knee: Secondary | ICD-10-CM | POA: Diagnosis not present

## 2015-06-07 ENCOUNTER — Encounter: Payer: Self-pay | Admitting: Physical Therapy

## 2015-06-07 ENCOUNTER — Ambulatory Visit: Payer: Medicare Other | Attending: Family Medicine | Admitting: Physical Therapy

## 2015-06-07 DIAGNOSIS — M25551 Pain in right hip: Secondary | ICD-10-CM | POA: Diagnosis not present

## 2015-06-07 DIAGNOSIS — R262 Difficulty in walking, not elsewhere classified: Secondary | ICD-10-CM | POA: Insufficient documentation

## 2015-06-07 DIAGNOSIS — M25561 Pain in right knee: Secondary | ICD-10-CM | POA: Diagnosis not present

## 2015-06-07 DIAGNOSIS — R29898 Other symptoms and signs involving the musculoskeletal system: Secondary | ICD-10-CM | POA: Diagnosis not present

## 2015-06-07 NOTE — Therapy (Signed)
[facility] [address] [place], [place], [address] Phone: [phone]   Fax:  [phone]  Physical Therapy Evaluation  Patient Details  Name: Alexandra [name] MRN: [id number] Date of Birth: [birth year] Referring Provider: Dr. Rip Harbour  Encounter Date: 06/07/2015      PT End of Session - 06/07/15 1044    Visit Number 1   Date for PT Re-Evaluation 08/07/15   PT Start Time 1014   PT Stop Time 1101   PT Time Calculation (min) 47 min   Activity Tolerance Patient tolerated treatment well   Behavior During Therapy [facility] for tasks assessed/performed      Past Medical History  Diagnosis Date  . Hypertension   . Hyperlipidemia     Past Surgical History  Procedure Laterality Date  . Fracture surgery      2007 -right tib /fib fracture ----07/2009 right femur fx after a fall  . Fracture surgery Right 2010    Femur  . Peripheral vascular catheterization N/A 08/09/2014    Procedure: Aortic Arch Angiography;  Surgeon: Serafina Mitchell, MD;  Location: [facility];  Service: Cardiovascular;  Laterality: N/A;  . Peripheral vascular catheterization Right 08/09/2014    Procedure: Upper Extremity Angiography;  Surgeon: Serafina Mitchell, MD;  Location: [facility];  Service: Cardiovascular;  Laterality: Right;  . Ultrasound guidance for vascular access  08/09/2014    Procedure: Ultrasound Guidance For Vascular Access;  Surgeon: Serafina Mitchell, MD;  Location: [facility];  Service: Cardiovascular;;    There were no vitals filed for this visit.  Visit Diagnosis:  Right hip pain - Plan: PT plan of care cert/re-cert  Right knee pain - Plan: PT plan of care cert/re-cert  Difficulty walking - Plan: PT plan of care cert/re-cert  Weakness of right hip - Plan: PT plan of care cert/re-cert      Subjective Assessment - 06/07/15 1015    Subjective Patient reports that she has been having right knee and leg pain and  reports having difficulty walking and going grocery shopping.  She has a history of femur fracture with IM nail and tib/fib fracture with ORIF in 2011 and 2007 respectively.   Limitations Standing;Walking   Patient Stated Goals walk better, longer and have less pain   Currently in Pain? Yes   Pain Score 2    Pain Location Knee   Pain Orientation Right   Pain Descriptors / Indicators Aching;Sore   Pain Type Chronic pain   Pain Radiating Towards pain in the right thigh   Pain Onset More than a month ago   Pain Frequency Constant   Aggravating Factors  walking or standing pain up to 9/10 10 minutes of standing is her limit   Pain Relieving Factors rest pain can be 0/10, she is taking pain meds   Effect of Pain on Daily Activities can't stand more than 5 minutes, difficulty walking > 3 minutes, will need to rest often due to pain            [facility] PT Assessment - 06/07/15 0001    Assessment   Medical Diagnosis right leg and knee pain   Referring Provider Dr. Rip Harbour   Onset Date/Surgical Date 05/10/15   Prior Therapy after the fractures in 2011   Precautions   Precautions None   Balance Screen   Has the patient fallen in the past 6 months No   Has the patient had  a decrease in activity level because of a fear of falling?  No   Is the patient reluctant to leave their home because of a fear of falling?  No   Home Environment   Additional Comments does her own shopping and housework   Prior Function   Level of Independence Independent   Vocation Retired   Leisure no exercise   AROM   AROM Assessment Site Hip;Knee   Right/Left Hip Right   Right Hip Extension 10   Right Hip Flexion 70   Right Hip External Rotation  15   Right Hip ABduction 10   Right Knee Extension 0   Right Knee Flexion 100   Strength   Overall Strength Comments 3+/ 5 for the right knee, 4-/5 for the right hip excpet 3+/5 for right hip abduction   Flexibility   Soft Tissue Assessment /Muscle Length --   very tight in the adductors   Hamstrings tight   Quadriceps very tight here at the hip flexor   ITB very tight   Piriformis very tight   Palpation   Palpation comment tender in the adductors, the quads, the ITB and GT area, some tenderness around the patella   Ambulation/Gait   Gait Comments does not use an assistive device, antalgic on the right, with a trunk lean over the right side with right stance phase due to hip abductor weakness. tolerance for gait is about 3 minutes before she has to stop and rest about 60 feet and has to stop, but does not have to sit                   [facility] Adult PT Treatment/Exercise - 06/07/15 0001    Modalities   Modalities Electrical Stimulation;Moist Heat   Moist Heat Therapy   Number Minutes Moist Heat 15 Minutes   Moist Heat Location Hip   Electrical Stimulation   Electrical Stimulation Location right thigh   Electrical Stimulation Action IFC   Electrical Stimulation Parameters supine   Electrical Stimulation Goals Pain                PT Education - 06/07/15 1043    Education provided Yes   Education Details gave stretches for HS, ITB, piriformis and hip flexor/quad   Person(s) Educated Patient   Methods Explanation;Demonstration;Handout   Comprehension Verbalized understanding          PT Short Term Goals - 06/07/15 1047    PT SHORT TERM GOAL #1   Title independent with intial HEP   Time 2   Period Weeks           PT Long Term Goals - 06/07/15 1048    PT LONG TERM GOAL #1   Title decrease pain 50%   Time 8   Period Weeks   Status New   PT LONG TERM GOAL #2   Title walk 400 feet without rest   Time 8   Period Weeks   Status New   PT LONG TERM GOAL #3   Title increase hip strength to 4/5   Time 8   Period Weeks   Status New   PT LONG TERM GOAL #4   Title be able to cook a meal without having to sit and rest   Time 8   Period Weeks   Status New               Plan - 06/07/15 1044     Clinical Impression Statement Patient with  right hip, thigh and knee pain over the past 1-2 years, has a history of IM nail in the right femur and ORIF of the right tib/fib in 2011 and 2007.  It seems that she has compensated over the years with her gait and currently is very tight in the quad, hip flexor, adductors, piriformis and ITB.  She is very tender in the right thigh and groin and into the right GT area.     Pt will benefit from skilled therapeutic intervention in order to improve on the following deficits Abnormal gait;Decreased activity tolerance;Decreased mobility;Decreased endurance;Decreased range of motion;Decreased strength;Difficulty walking;Increased muscle spasms;Impaired flexibility;Pain   Rehab Potential Good   PT Frequency 2x / week   PT Duration 8 weeks   PT Treatment/Interventions ADLs/Self Care Home Management;Cryotherapy;Electrical Stimulation;Iontophoresis 4mg /ml Dexamethasone;Moist Heat;Therapeutic exercise;Functional mobility training;Stair training;Gait training;Ultrasound;Patient/family education;Manual techniques   PT Next Visit Plan Slowly add exercises and flexibility   Consulted and Agree with Plan of Care Patient          G-Codes - 06-15-2015 1054    Functional Assessment Tool Used Foto 67% limitation   Functional Limitation Mobility: Walking and moving around   Mobility: Walking and Moving Around Current Status [phone]) At least 60 percent but less than 80 percent impaired, limited or restricted   Mobility: Walking and Moving Around Goal Status [phone]) At least 40 percent but less than 60 percent impaired, limited or restricted       Problem List Patient Active Problem List   Diagnosis Date Noted  . Vitamin D deficiency 05/16/2015  . Aortic stenosis with mitral and aortic insufficiency 07/29/2014  . Hypertension 07/29/2014  . Hyperlipidemia 07/29/2014    Sumner Boast., PT June 15, 2015, 10:59 AM  [facility] [address] [place], [place], [address] Phone: [phone]   Fax:  [phone]  Name: Alexandra [name] MRN: [id number] Date of Birth: 07-24-1950

## 2015-06-14 ENCOUNTER — Ambulatory Visit: Payer: Medicare Other | Admitting: Physical Therapy

## 2015-06-14 ENCOUNTER — Encounter: Payer: Self-pay | Admitting: Physical Therapy

## 2015-06-14 DIAGNOSIS — M25561 Pain in right knee: Secondary | ICD-10-CM | POA: Diagnosis not present

## 2015-06-14 DIAGNOSIS — R262 Difficulty in walking, not elsewhere classified: Secondary | ICD-10-CM | POA: Diagnosis not present

## 2015-06-14 DIAGNOSIS — R29898 Other symptoms and signs involving the musculoskeletal system: Secondary | ICD-10-CM

## 2015-06-14 DIAGNOSIS — M25551 Pain in right hip: Secondary | ICD-10-CM

## 2015-06-14 NOTE — Therapy (Signed)
[place], [place], [address] Phone: [phone]   Fax:  [phone]  Physical Therapy Treatment  Patient Details  Name: Alexandra [name] MRN: [id number] Date of Birth: 1951-01-24 Referring Provider: Dr. Rip Harbour  Encounter Date: 06/14/2015      PT End of Session - 06/14/15 1221    Visit Number 2   Date for PT Re-Evaluation 08/07/15   PT Start Time 1146   PT Stop Time [id number]   PT Time Calculation (min) 49 min      Past Medical History  Diagnosis Date  . Hypertension   . Hyperlipidemia     Past Surgical History  Procedure Laterality Date  . Fracture surgery      2007 -right tib /fib fracture ----07/2009 right femur fx after a fall  . Fracture surgery Right 2010    Femur  . Peripheral vascular catheterization N/A 08/09/2014    Procedure: Aortic Arch Angiography;  Surgeon: Serafina Mitchell, MD;  Location: [place] [facility];  Service: Cardiovascular;  Laterality: N/A;  . Peripheral vascular catheterization Right 08/09/2014    Procedure: Upper Extremity Angiography;  Surgeon: Serafina Mitchell, MD;  Location: [facility];  Service: Cardiovascular;  Laterality: Right;  . Ultrasound guidance for vascular access  08/09/2014    Procedure: Ultrasound Guidance For Vascular Access;  Surgeon: Serafina Mitchell, MD;  Location: [facility];  Service: Cardiovascular;;    There were no vitals filed for this visit.  Visit Diagnosis:  Right hip pain  Right knee pain  Difficulty walking  Weakness of right hip      Subjective Assessment - 06/14/15 1149    Subjective Pt reports no issues.   Currently in Pain? Yes   Pain Score 4    Pain Location Leg   Pain Orientation Right                         [facility] Adult PT Treatment/Exercise - 06/14/15 0001    Exercises   Exercises Knee/Hip   Knee/Hip Exercises: Aerobic   Nustep L2 x6 min    Knee/Hip Exercises: Machines for  Strengthening   Cybex Knee Extension #5 2x10   Cybex Knee Flexion #20 2x10   Cybex Leg Press #20 2x10    Knee/Hip Exercises: Seated   Long Arc Quad Right;2 sets;10 reps   Marching 2 sets;Right;10 reps   Abduction/Adduction  Both;2 sets;10 reps   Abd/Adduction Limitations blue Tband    Modalities   Modalities Electrical Stimulation;Moist Heat   Moist Heat Therapy   Number Minutes Moist Heat 15 Minutes   Moist Heat Location Hip   Electrical Stimulation   Electrical Stimulation Location right thigh   Electrical Stimulation Action pre mod   Electrical Stimulation Parameters supine   Electrical Stimulation Goals Pain                  PT Short Term Goals - 06/07/15 1047    PT SHORT TERM GOAL #1   Title independent with intial HEP   Time 2   Period Weeks           PT Long Term Goals - 06/07/15 1048    PT LONG TERM GOAL #1   Title decrease pain 50%   Time 8   Period Weeks   Status New   PT LONG TERM GOAL #2   Title walk 400 feet without rest  Time 8   Period Weeks   Status New   PT LONG TERM GOAL #3   Title increase hip strength to 4/5   Time 8   Period Weeks   Status New   PT LONG TERM GOAL #4   Title be able to cook a meal without having to sit and rest   Time 8   Period Weeks   Status New               Plan - 06/14/15 1222    Clinical Impression Statement No c/o increase pain during today's session. Pt does report soreness in R this mostly with seated knee extensions. Decrease activity tolerance requiring multiple rest breaks. Antalgic gait with decrease stance on  RLE.   Pt will benefit from skilled therapeutic intervention in order to improve on the following deficits Abnormal gait;Decreased activity tolerance;Decreased mobility;Decreased endurance;Decreased range of motion;Decreased strength;Difficulty walking;Increased muscle spasms;Impaired flexibility;Pain   Rehab Potential Good   PT Frequency 2x / week   PT Duration 8 weeks   PT  Treatment/Interventions ADLs/Self Care Home Management;Cryotherapy;Electrical Stimulation;Iontophoresis 4mg /ml Dexamethasone;Moist Heat;Therapeutic exercise;Functional mobility training;Stair training;Gait training;Ultrasound;Patient/family education;Manual techniques   PT Next Visit Plan Slowly add exercises and flexibility        Problem List Patient Active Problem List   Diagnosis Date Noted  . Vitamin D deficiency 05/16/2015  . Aortic stenosis with mitral and aortic insufficiency 07/29/2014  . Hypertension 07/29/2014  . Hyperlipidemia 07/29/2014    Scot Jun, PTA  06/14/2015, 12:24 PM  [place], [place], [address] Phone: [phone]   Fax:  [phone]  Name: Alexandra [name] MRN: [id number] Date of Birth: Jul 15, 1950

## 2015-06-15 ENCOUNTER — Telehealth: Payer: Self-pay

## 2015-06-15 DIAGNOSIS — M79604 Pain in right leg: Secondary | ICD-10-CM

## 2015-06-15 NOTE — Telephone Encounter (Signed)
-----   Message -----     From: Tye Savoy    Sent: 05/31/2015  2:55 PM     To: Harrison Mons, PA-C      Pt had an appointment with Dr. Rip Harbour on 05-23-15 at [facility]

## 2015-06-15 NOTE — Telephone Encounter (Signed)
Pt needs a refill on Percocet  Please advise  [phone]

## 2015-06-16 ENCOUNTER — Encounter: Payer: Self-pay | Admitting: Physical Therapy

## 2015-06-16 ENCOUNTER — Ambulatory Visit: Payer: Medicare Other | Admitting: Physical Therapy

## 2015-06-16 DIAGNOSIS — M25551 Pain in right hip: Secondary | ICD-10-CM | POA: Diagnosis not present

## 2015-06-16 DIAGNOSIS — R29898 Other symptoms and signs involving the musculoskeletal system: Secondary | ICD-10-CM

## 2015-06-16 DIAGNOSIS — R262 Difficulty in walking, not elsewhere classified: Secondary | ICD-10-CM | POA: Diagnosis not present

## 2015-06-16 DIAGNOSIS — M25561 Pain in right knee: Secondary | ICD-10-CM | POA: Diagnosis not present

## 2015-06-16 NOTE — Telephone Encounter (Signed)
She should be getting all narcotics from ortho at this time.

## 2015-06-16 NOTE — Telephone Encounter (Signed)
Patient is calling to follow up on refill request °

## 2015-06-16 NOTE — Therapy (Signed)
[place], [place], [address] Phone: [phone]   Fax:  [phone]  Physical Therapy Treatment  Patient Details  Name: Alexandra [name] MRN: [id number] Date of Birth: 1950/08/18 Referring Provider: Dr. Rip Harbour  Encounter Date: 06/16/2015      PT End of Session - 06/16/15 1131    Visit Number 3   Date for PT Re-Evaluation 08/07/15   PT Start Time 1100   PT Stop Time 1150   PT Time Calculation (min) 50 min      Past Medical History  Diagnosis Date  . Hypertension   . Hyperlipidemia     Past Surgical History  Procedure Laterality Date  . Fracture surgery      2007 -right tib /fib fracture ----07/2009 right femur fx after a fall  . Fracture surgery Right 2010    Femur  . Peripheral vascular catheterization N/A 08/09/2014    Procedure: Aortic Arch Angiography;  Surgeon: Serafina Mitchell, MD;  Location: [place] [facility];  Service: Cardiovascular;  Laterality: N/A;  . Peripheral vascular catheterization Right 08/09/2014    Procedure: Upper Extremity Angiography;  Surgeon: Serafina Mitchell, MD;  Location: [place] [facility] [facility];  Service: Cardiovascular;  Laterality: Right;  . Ultrasound guidance for vascular access  08/09/2014    Procedure: Ultrasound Guidance For Vascular Access;  Surgeon: Serafina Mitchell, MD;  Location: [facility];  Service: Cardiovascular;;    There were no vitals filed for this visit.  Visit Diagnosis:  Right hip pain  Weakness of right hip      Subjective Assessment - 06/16/15 1107    Subjective the more I do or more I walk the more it hurts, cant walk very far   Currently in Pain? Yes   Pain Score 4    Pain Location Leg   Pain Orientation Right                         [facility] Adult PT Treatment/Exercise - 06/16/15 0001    Knee/Hip Exercises: Aerobic   Nustep L2 x6 min   1  rest needed   Knee/Hip Exercises: Machines for Strengthening   Cybex Knee Extension #5 2x10   Cybex Knee Flexion #20 2x10   Cybex Leg Press #20 2x10    Knee/Hip Exercises: Standing   Other Standing Knee Exercises red tband hip flex,ext and abd 10 times each leg   Knee/Hip Exercises: Supine   Bridges with Ball Squeeze Both;1 set;10 reps   Bridges with Clamshell Both;1 set;10 reps  red tband   Straight Leg Raises 2 sets;10 reps;Right;Strengthening  with abd and red tband   Other Supine Knee/Hip Exercises bridge with large ball 10 times   Modalities   Modalities Electrical Stimulation;Moist Heat   Moist Heat Therapy   Number Minutes Moist Heat 15 Minutes   Moist Heat Location Hip   Electrical Stimulation   Electrical Stimulation Location right thigh   Electrical Stimulation Action IFC   Electrical Stimulation Parameters supine   Electrical Stimulation Goals Pain                  PT Short Term Goals - 06/16/15 1132    PT SHORT TERM GOAL #1   Title independent with intial HEP   Status Achieved           PT Long Term Goals - 06/07/15 1048    PT LONG TERM  GOAL #1   Title decrease pain 50%   Time 8   Period Weeks   Status New   PT LONG TERM GOAL #2   Title walk 400 feet without rest   Time 8   Period Weeks   Status New   PT LONG TERM GOAL #3   Title increase hip strength to 4/5   Time 8   Period Weeks   Status New   PT LONG TERM GOAL #4   Title be able to cook a meal without having to sit and rest   Time 8   Period Weeks   Status New               Plan - 06/16/15 1132    Clinical Impression Statement fatigued easi;ly with ther ex with some cuing needed for compensation esp with standing ther ex.   PT Next Visit Plan Slowly add exercises and flexibility        Problem List Patient Active Problem List   Diagnosis Date Noted  . Vitamin D deficiency 05/16/2015  . Aortic stenosis with mitral and aortic insufficiency 07/29/2014  . Hypertension 07/29/2014  . Hyperlipidemia 07/29/2014    PAYSEUR,ANGIE  PTA 06/16/2015, 11:33 AM  [place], [place], [address] Phone: [phone]   Fax:  [phone]  Name: Alexandra [name] MRN: [id number] Date of Birth: 1950-08-31

## 2015-06-20 ENCOUNTER — Encounter: Payer: Self-pay | Admitting: Physical Therapy

## 2015-06-20 ENCOUNTER — Ambulatory Visit: Payer: Medicare Other | Admitting: Physical Therapy

## 2015-06-20 DIAGNOSIS — M25561 Pain in right knee: Secondary | ICD-10-CM

## 2015-06-20 DIAGNOSIS — M25551 Pain in right hip: Secondary | ICD-10-CM | POA: Diagnosis not present

## 2015-06-20 DIAGNOSIS — R29898 Other symptoms and signs involving the musculoskeletal system: Secondary | ICD-10-CM | POA: Diagnosis not present

## 2015-06-20 DIAGNOSIS — R262 Difficulty in walking, not elsewhere classified: Secondary | ICD-10-CM

## 2015-06-20 NOTE — Therapy (Signed)
[place], [place], [address] Phone: [phone]   Fax:  [phone]  Physical Therapy Treatment  Patient Details  Name: Alexandra [name] MRN: [id number] Date of Birth: 25-Feb-1951 Referring Provider: Dr. Rip Harbour  Encounter Date: 06/20/2015      PT End of Session - 06/20/15 1226    Visit Number 4   Date for PT Re-Evaluation 08/07/15   PT Start Time 9983   PT Stop Time 1223   PT Time Calculation (min) 38 min   Activity Tolerance Patient limited by fatigue   Behavior During Therapy [facility] for tasks assessed/performed      Past Medical History  Diagnosis Date  . Hypertension   . Hyperlipidemia     Past Surgical History  Procedure Laterality Date  . Fracture surgery      2007 -right tib /fib fracture ----07/2009 right femur fx after a fall  . Fracture surgery Right 2010    Femur  . Peripheral vascular catheterization N/A 08/09/2014    Procedure: Aortic Arch Angiography;  Surgeon: Serafina Mitchell, MD;  Location: [facility];  Service: Cardiovascular;  Laterality: N/A;  . Peripheral vascular catheterization Right 08/09/2014    Procedure: Upper Extremity Angiography;  Surgeon: Serafina Mitchell, MD;  Location: [facility];  Service: Cardiovascular;  Laterality: Right;  . Ultrasound guidance for vascular access  08/09/2014    Procedure: Ultrasound Guidance For Vascular Access;  Surgeon: Serafina Mitchell, MD;  Location: [facility];  Service: Cardiovascular;;    There were no vitals filed for this visit.  Visit Diagnosis:  Right hip pain  Weakness of right hip  Right knee pain  Difficulty walking      Subjective Assessment - 06/20/15 1147    Subjective "All right an dits about an three or four" Pt reports that she was sore for two days after last treatment.   Currently in Pain? Yes   Pain Score 3    Pain Location Leg                         [facility] Adult  PT Treatment/Exercise - 06/20/15 0001    Knee/Hip Exercises: Aerobic   Nustep L2 x6 min    Knee/Hip Exercises: Machines for Strengthening   Cybex Knee Extension #10 2x15   Cybex Knee Flexion #25 2x15   Cybex Leg Press #20 2x10    Knee/Hip Exercises: Standing   Lateral Step Up 2 sets;Right;10 reps;Step Height: 6";Hand Hold: 1   Other Standing Knee Exercises red tband hip,ext and abd 10 times each leg   Knee/Hip Exercises: Seated   Long Arc Quad Right;2 sets;10 reps   Marching 2 sets;Right;10 reps                  PT Short Term Goals - 06/16/15 1132    PT SHORT TERM GOAL #1   Title independent with intial HEP   Status Achieved           PT Long Term Goals - 06/20/15 1158    PT LONG TERM GOAL #4   Title be able to cook a meal without having to sit and rest   Status Partially Met               Plan - 06/20/15 1227    Clinical Impression Statement Continues to fatigue easily during exercises. Shortened PT session due to pt  being fatigue and sweating profusely during session.   Pt will benefit from skilled therapeutic intervention in order to improve on the following deficits Abnormal gait;Decreased activity tolerance;Decreased mobility;Decreased endurance;Decreased range of motion;Decreased strength;Difficulty walking;Increased muscle spasms;Impaired flexibility;Pain   Rehab Potential Good   PT Frequency 2x / week   PT Duration 8 weeks   PT Treatment/Interventions ADLs/Self Care Home Management;Cryotherapy;Electrical Stimulation;Iontophoresis 53m/ml Dexamethasone;Moist Heat;Therapeutic exercise;Functional mobility training;Stair training;Gait training;Ultrasound;Patient/family education;Manual techniques   PT Next Visit Plan Slowly add exercises and flexibility        Problem List Patient Active Problem List   Diagnosis Date Noted  . Vitamin D deficiency 05/16/2015  . Aortic stenosis with mitral and aortic insufficiency 07/29/2014  . Hypertension  07/29/2014  . Hyperlipidemia 07/29/2014    RScot Jun PTA  06/20/2015, 12:29 PM  [place] [address]Phone: [phone]  Fax:  [phone] Name: Alexandra ESCARENOMRN: [id number] of Birth: [birth year]

## 2015-06-20 NOTE — Telephone Encounter (Signed)
Spoke with pt, she states Chelle said she would write her medication until May when she sees the orthopedist. She states she is in therapy and in a lot of pain. Please advise.

## 2015-06-21 ENCOUNTER — Ambulatory Visit: Payer: Medicare Other | Admitting: Physical Therapy

## 2015-06-22 MED ORDER — OXYCODONE-ACETAMINOPHEN 7.5-325 MG PO TABS: 0.5000 | ORAL_TABLET | Freq: Three times a day (TID) | ORAL | Status: DC | PRN

## 2015-06-22 NOTE — Telephone Encounter (Signed)
I've refilled the prescription (Rx printed).  Please ask that a copy of the OV with Dr. Rip Harbour be sent to me.  The plan was that if there is nothing orthopedically that can be done to address her pain, I would refer her to Chronic Pain Management.  Meds ordered this encounter  Medications  . oxyCODONE-acetaminophen (PERCOCET) 7.5-325 MG tablet    Sig: Take 0.5-1 tablets by mouth every 8 (eight) hours as needed for severe pain.    Dispense:  90 tablet    Refill:  0    Order Specific Question:  Supervising Provider    Answer:  DOOLITTLE, ROBERT P [id number]

## 2015-06-22 NOTE — Telephone Encounter (Signed)
I called [facility], she did go to see Dr. Rip Harbour on 05/23/2015 but she does not have a scheduled follow up. She did not receive any medications from Dr. Rip Harbour and she states the plan says follow up in 4 weeks or as needed.

## 2015-06-22 NOTE — Telephone Encounter (Signed)
Please call [facility]. Did she see Dr. Rip Harbour on 2/28 or was that appointment rescheduled. If it was rescheduled, or they can confirm that they did not prescribe pain medication and do not plan to, I will do it.

## 2015-06-23 NOTE — Telephone Encounter (Signed)
Spoke with pt, advised message and she will have [facility] send [place] the last OV note.

## 2015-06-26 ENCOUNTER — Encounter: Payer: Self-pay | Admitting: Physical Therapy

## 2015-06-26 ENCOUNTER — Ambulatory Visit: Payer: Medicare Other | Attending: Family Medicine | Admitting: Physical Therapy

## 2015-06-26 DIAGNOSIS — M6281 Muscle weakness (generalized): Secondary | ICD-10-CM | POA: Insufficient documentation

## 2015-06-26 DIAGNOSIS — R29898 Other symptoms and signs involving the musculoskeletal system: Secondary | ICD-10-CM | POA: Insufficient documentation

## 2015-06-26 DIAGNOSIS — M25551 Pain in right hip: Secondary | ICD-10-CM | POA: Insufficient documentation

## 2015-06-26 DIAGNOSIS — R262 Difficulty in walking, not elsewhere classified: Secondary | ICD-10-CM | POA: Diagnosis not present

## 2015-06-26 DIAGNOSIS — M25561 Pain in right knee: Secondary | ICD-10-CM | POA: Diagnosis not present

## 2015-06-26 NOTE — Therapy (Signed)
[place], [place], [address] Phone: [phone]   Fax:  [phone]  Physical Therapy Treatment  Patient Details  Name: Alexandra [name] MRN: [id number] Date of Birth: 1951/03/09 Referring Provider: Dr. Rip Harbour  Encounter Date: 06/26/2015      PT End of Session - 06/26/15 1208    Visit Number 5   Date for PT Re-Evaluation 08/07/15   PT Start Time 9924   PT Stop Time 1206   PT Time Calculation (min) 21 min   Activity Tolerance Patient limited by fatigue   Behavior During Therapy [facility] for tasks assessed/performed      Past Medical History  Diagnosis Date  . Hypertension   . Hyperlipidemia     Past Surgical History  Procedure Laterality Date  . Fracture surgery      2007 -right tib /fib fracture ----07/2009 right femur fx after a fall  . Fracture surgery Right 2010    Femur  . Peripheral vascular catheterization N/A 08/09/2014    Procedure: Aortic Arch Angiography;  Surgeon: Serafina Mitchell, MD;  Location: [facility];  Service: Cardiovascular;  Laterality: N/A;  . Peripheral vascular catheterization Right 08/09/2014    Procedure: Upper Extremity Angiography;  Surgeon: Serafina Mitchell, MD;  Location: [facility];  Service: Cardiovascular;  Laterality: Right;  . Ultrasound guidance for vascular access  08/09/2014    Procedure: Ultrasound Guidance For Vascular Access;  Surgeon: Serafina Mitchell, MD;  Location: [facility];  Service: Cardiovascular;;    There were no vitals filed for this visit.  Visit Diagnosis:  Right hip pain  Weakness of right hip  Right knee pain  Difficulty walking      Subjective Assessment - 06/26/15 1152    Subjective Pt reports that it only hurts the day after she leaves therapy   Currently in Pain? Yes   Pain Score 5    Pain Location Knee   Pain Orientation Right                         [facility] Adult PT  Treatment/Exercise - 06/26/15 0001    Ambulation/Gait   Ambulation/Gait Yes   Ambulation/Gait Assistance 6: Modified independent (Device/Increase time)   Ambulation Distance (Feet) --  >400   Assistive device None   Gait Pattern Step-through pattern;Decreased stance time - right   Ambulation Surface Unlevel;Outdoor   Gait Comments Gait outdoor up and down slopes, slight antalgic leans to R side.   Knee/Hip Exercises: Aerobic   Nustep L3 x6 min    Knee/Hip Exercises: Machines for Strengthening   Cybex Leg Press #20 2x10                   PT Short Term Goals - 06/16/15 1132    PT SHORT TERM GOAL #1   Title independent with intial HEP   Status Achieved           PT Long Term Goals - 06/26/15 1155    PT LONG TERM GOAL #1   Title decrease pain 50%   Status On-going   PT LONG TERM GOAL #2   Title walk 400 feet without rest   Status Achieved   PT LONG TERM GOAL #3   Title increase hip strength to 4/5   Status On-going   PT LONG TERM GOAL #4   Title be able to cook a meal without  having to sit and rest   Status Partially Met               Plan - 06/26/15 1209    Clinical Impression Statement Pt reports she only have pain the day after therapy. Pt progressed to outdoor ambulation on uneven surfaces. After gait trial pt report that she was too fatigued to continue therapy.    Pt will benefit from skilled therapeutic intervention in order to improve on the following deficits Abnormal gait;Decreased activity tolerance;Decreased mobility;Decreased endurance;Decreased range of motion;Decreased strength;Difficulty walking;Increased muscle spasms;Impaired flexibility;Pain   Rehab Potential Good   PT Frequency 2x / week   PT Duration 8 weeks   PT Treatment/Interventions ADLs/Self Care Home Management;Cryotherapy;Electrical Stimulation;Iontophoresis 58m/ml Dexamethasone;Moist Heat;Therapeutic exercise;Functional mobility training;Stair training;Gait  training;Ultrasound;Patient/family education;Manual techniques   PT Next Visit Plan functional endurance,low weight and reps due tpo pt c/o pain after therapy.        Problem List Patient Active Problem List   Diagnosis Date Noted  . Vitamin D deficiency 05/16/2015  . Aortic stenosis with mitral and aortic insufficiency 07/29/2014  . Hypertension 07/29/2014  . Hyperlipidemia 07/29/2014    RScot Jun PTA  06/26/2015, 12:11 PM  [place] [address]Phone: [phone]  Fax:  [phone] Name: Alexandra DEFRANCESCOMRN: [id number] of Birth: [birth year]

## 2015-06-27 ENCOUNTER — Encounter: Payer: Medicare Other | Admitting: Physical Therapy

## 2015-06-30 ENCOUNTER — Ambulatory Visit: Payer: Medicare Other | Admitting: Physical Therapy

## 2015-06-30 ENCOUNTER — Encounter: Payer: Self-pay | Admitting: Physical Therapy

## 2015-06-30 DIAGNOSIS — M25551 Pain in right hip: Secondary | ICD-10-CM | POA: Diagnosis not present

## 2015-06-30 DIAGNOSIS — M6281 Muscle weakness (generalized): Secondary | ICD-10-CM

## 2015-06-30 DIAGNOSIS — R262 Difficulty in walking, not elsewhere classified: Secondary | ICD-10-CM | POA: Diagnosis not present

## 2015-06-30 DIAGNOSIS — M25561 Pain in right knee: Secondary | ICD-10-CM | POA: Diagnosis not present

## 2015-06-30 DIAGNOSIS — R29898 Other symptoms and signs involving the musculoskeletal system: Secondary | ICD-10-CM

## 2015-06-30 NOTE — Addendum Note (Signed)
Addended by: Sumner Boast on: 06/30/2015 10:06 AM   Modules accepted: Orders

## 2015-06-30 NOTE — Therapy (Addendum)
[place], [place], [address] Phone: [phone]   Fax:  [phone]  Physical Therapy Treatment  Patient Details  Name: Alexandra [name] MRN: [id number] Date of Birth: [birth year] Referring Provider: Dr. Rip Harbour  Encounter Date: 06/30/2015      PT End of Session - 06/30/15 1012    Visit Number 6   Date for PT Re-Evaluation 08/07/15   PT Start Time 0933   PT Stop Time 1015   PT Time Calculation (min) 42 min   Activity Tolerance Patient limited by fatigue   Behavior During Therapy [facility] for tasks assessed/performed      Past Medical History  Diagnosis Date  . Hypertension   . Hyperlipidemia     Past Surgical History  Procedure Laterality Date  . Fracture surgery      2007 -right tib /fib fracture ----07/2009 right femur fx after a fall  . Fracture surgery Right 2010    Femur  . Peripheral vascular catheterization N/A 08/09/2014    Procedure: Aortic Arch Angiography;  Surgeon: Serafina Mitchell, MD;  Location: [place] [facility];  Service: Cardiovascular;  Laterality: N/A;  . Peripheral vascular catheterization Right 08/09/2014    Procedure: Upper Extremity Angiography;  Surgeon: Serafina Mitchell, MD;  Location: [place] [facility];  Service: Cardiovascular;  Laterality: Right;  . Ultrasound guidance for vascular access  08/09/2014    Procedure: Ultrasound Guidance For Vascular Access;  Surgeon: Serafina Mitchell, MD;  Location: [place] [facility];  Service: Cardiovascular;;    There were no vitals filed for this visit.      Subjective Assessment - 06/30/15 0934    Subjective Pt reports she is using a heat pack at home for pain. She was less sore the day after her last PT session.   Currently in Pain? Yes   Pain Score 7    Pain Location Knee   Pain Orientation Right   Pain Descriptors / Indicators Aching;Sore   Aggravating Factors  Walking   Pain Relieving Factors Rest, heat                          [facility] Adult PT Treatment/Exercise - 06/30/15 0001    Ambulation/Gait   Gait Comments Forward independently, backward and sidestepping with HHA, antalgic gait with hip abductor weakness with lean over right side    Knee/Hip Exercises: Aerobic   Nustep L3 x6 min    Knee/Hip Exercises: Machines for Strengthening   Cybex Leg Press #20 2x10    Knee/Hip Exercises: Standing   Forward Step Up Right;2 sets;10 reps;Step Height: 6"   Other Standing Knee Exercises red tband hip,ext and abd 2x10 each leg   Other Standing Knee Exercises Standing marches, 2x10 each leg   Knee/Hip Exercises: Seated   Long Arc Quad Both;2 sets;15 reps                  PT Short Term Goals - 06/16/15 1132    PT SHORT TERM GOAL #1   Title independent with intial HEP   Status Achieved           PT Long Term Goals - 06/26/15 1155    PT LONG TERM GOAL #1   Title decrease pain 50%   Status On-going   PT LONG TERM GOAL #2   Title walk 400 feet without rest   Status Achieved   PT LONG TERM  GOAL #3   Title increase hip strength to 4/5   Status On-going   PT LONG TERM GOAL #4   Title be able to cook a meal without having to sit and rest   Status Partially Met               Plan - 06/30/15 1013    Clinical Impression Statement Pt reports pain with gait, antalgic gait and abductor weakness with lean over right side. Pt fatigued after periods of exercise, pain was also a limiting factor.   PT Treatment/Interventions ADLs/Self Care Home Management;Cryotherapy;Electrical Stimulation;Iontophoresis 4m/ml Dexamethasone;Moist Heat;Therapeutic exercise;Functional mobility training;Stair training;Gait training;Ultrasound;Patient/family education;Manual techniques   PT Next Visit Plan Progress ther ex within tolerance and time spent on gait training. Focus on hip ABD weakness.      Patient will benefit from skilled therapeutic intervention in order to improve the  following deficits and impairments:     Visit Diagnosis: Right hip pain  Muscle weakness (generalized)  Weakness of right hip  Right knee pain  Difficulty walking  PHYSICAL THERAPY DISCHARGE SUMMARY  *  Plan: Patient agrees to discharge.  Patient goals were partially met. Patient is being discharged due to not returning since the last visit.  ?????       Problem List Patient Active Problem List   Diagnosis Date Noted  . Vitamin D deficiency 05/16/2015  . Aortic stenosis with mitral and aortic insufficiency 07/29/2014  . Hypertension 07/29/2014  . Hyperlipidemia 07/29/2014    RNaaman PlummerSPTA 06/30/2015, 10:19 AM  [place] [address]Phone: [phone]  Fax:  [phone] Name: Alexandra MAPPMRN: [id number] of Birth: 302/14/1952

## 2015-07-04 ENCOUNTER — Ambulatory Visit: Payer: Medicare Other | Admitting: Physical Therapy

## 2015-07-16 ENCOUNTER — Encounter: Payer: Self-pay | Admitting: Physician Assistant

## 2015-07-16 DIAGNOSIS — S72009A Fracture of unspecified part of neck of unspecified femur, initial encounter for closed fracture: Secondary | ICD-10-CM | POA: Insufficient documentation

## 2015-07-16 DIAGNOSIS — S72009D Fracture of unspecified part of neck of unspecified femur, subsequent encounter for closed fracture with routine healing: Secondary | ICD-10-CM

## 2015-07-16 DIAGNOSIS — M1610 Unilateral primary osteoarthritis, unspecified hip: Secondary | ICD-10-CM | POA: Insufficient documentation

## 2015-07-16 DIAGNOSIS — F411 Generalized anxiety disorder: Secondary | ICD-10-CM

## 2015-07-16 DIAGNOSIS — R002 Palpitations: Secondary | ICD-10-CM | POA: Insufficient documentation

## 2015-07-16 DIAGNOSIS — G894 Chronic pain syndrome: Secondary | ICD-10-CM

## 2015-07-26 ENCOUNTER — Other Ambulatory Visit: Payer: Self-pay

## 2015-07-26 DIAGNOSIS — M79604 Pain in right leg: Secondary | ICD-10-CM

## 2015-07-26 NOTE — Telephone Encounter (Signed)
Patient is calling to request a refill for percocet. She asked that we call when ready. [phone]

## 2015-07-31 NOTE — Telephone Encounter (Signed)
Left message for Dr. Rip Harbour to return my call regarding this patient's request for refill of oxycodone.  I see the PT notes in the record, but no notes from Dr. Rip Harbour, and we need to be clear on the plan for treating her pain.

## 2015-08-01 MED ORDER — OXYCODONE-ACETAMINOPHEN 7.5-325 MG PO TABS: 0.5000 | ORAL_TABLET | Freq: Three times a day (TID) | ORAL | Status: DC | PRN

## 2015-08-01 NOTE — Telephone Encounter (Addendum)
Spoke with Dr. Rip Harbour. He will resend his note regarding this patient. They will not be prescribing pain medication for this patient, with the understanding that I will do so, or refer her to Pain Management.  Please schedule the patient to follow-up with me at the next available appointment.  Meds ordered this encounter  Medications  . oxyCODONE-acetaminophen (PERCOCET) 7.5-325 MG tablet    Sig: Take 0.5-1 tablets by mouth every 8 (eight) hours as needed for severe pain.    Dispense:  90 tablet    Refill:  0

## 2015-08-02 NOTE — Telephone Encounter (Signed)
Notified pt Rx ready. She reported that she already has an appt sch for 5/23.

## 2015-08-15 ENCOUNTER — Encounter: Payer: Self-pay | Admitting: Physician Assistant

## 2015-08-15 ENCOUNTER — Telehealth: Payer: Self-pay | Admitting: *Deleted

## 2015-08-15 ENCOUNTER — Ambulatory Visit (INDEPENDENT_AMBULATORY_CARE_PROVIDER_SITE_OTHER): Payer: Medicare Other | Admitting: Physician Assistant

## 2015-08-15 VITALS — BP 122/94 | HR 77 | Temp 98.5°F | Resp 16 | Ht 65.0 in | Wt 154.6 lb

## 2015-08-15 DIAGNOSIS — M79604 Pain in right leg: Secondary | ICD-10-CM

## 2015-08-15 DIAGNOSIS — E559 Vitamin D deficiency, unspecified: Secondary | ICD-10-CM

## 2015-08-15 DIAGNOSIS — Z114 Encounter for screening for human immunodeficiency virus [HIV]: Secondary | ICD-10-CM | POA: Diagnosis not present

## 2015-08-15 DIAGNOSIS — Z1159 Encounter for screening for other viral diseases: Secondary | ICD-10-CM | POA: Diagnosis not present

## 2015-08-15 DIAGNOSIS — E785 Hyperlipidemia, unspecified: Secondary | ICD-10-CM | POA: Diagnosis not present

## 2015-08-15 DIAGNOSIS — M8580 Other specified disorders of bone density and structure, unspecified site: Secondary | ICD-10-CM | POA: Insufficient documentation

## 2015-08-15 DIAGNOSIS — Z1231 Encounter for screening mammogram for malignant neoplasm of breast: Secondary | ICD-10-CM | POA: Diagnosis not present

## 2015-08-15 DIAGNOSIS — G894 Chronic pain syndrome: Secondary | ICD-10-CM | POA: Diagnosis not present

## 2015-08-15 DIAGNOSIS — E2839 Other primary ovarian failure: Secondary | ICD-10-CM | POA: Diagnosis not present

## 2015-08-15 DIAGNOSIS — Z23 Encounter for immunization: Secondary | ICD-10-CM | POA: Diagnosis not present

## 2015-08-15 DIAGNOSIS — I350 Nonrheumatic aortic (valve) stenosis: Secondary | ICD-10-CM

## 2015-08-15 DIAGNOSIS — Z1211 Encounter for screening for malignant neoplasm of colon: Secondary | ICD-10-CM

## 2015-08-15 DIAGNOSIS — I10 Essential (primary) hypertension: Secondary | ICD-10-CM

## 2015-08-15 LAB — CBC WITH DIFFERENTIAL/PLATELET
Basophils Absolute: 84 cells/uL (ref 0–200)
Basophils Relative: 1 %
Eosinophils Absolute: 252 cells/uL (ref 15–500)
Eosinophils Relative: 3 %
HCT: 41.8 % (ref 35.0–45.0)
Hemoglobin: 13.8 g/dL (ref 11.7–15.5)
LYMPHS PCT: 27 %
Lymphs Abs: 2268 cells/uL (ref 850–3900)
MCH: 29.4 pg (ref 27.0–33.0)
MCHC: 33 g/dL (ref 32.0–36.0)
MCV: 88.9 fL (ref 80.0–100.0)
MPV: 9.9 fL (ref 7.5–12.5)
Monocytes Absolute: 672 cells/uL (ref 200–950)
Monocytes Relative: 8 %
NEUTROS PCT: 61 %
Neutro Abs: 5124 cells/uL (ref 1500–7800)
PLATELETS: 282 10*3/uL (ref 140–400)
RBC: 4.7 MIL/uL (ref 3.80–5.10)
RDW: 13.8 % (ref 11.0–15.0)
WBC: 8.4 10*3/uL (ref 3.8–10.8)

## 2015-08-15 LAB — POCT URINALYSIS DIP (MANUAL ENTRY)
Glucose, UA: NEGATIVE
Ketones, POC UA: NEGATIVE
LEUKOCYTES UA: NEGATIVE
NITRITE UA: NEGATIVE
PH UA: 6
Spec Grav, UA: 1.02
UROBILINOGEN UA: 1

## 2015-08-15 LAB — LIPID PANEL
Cholesterol: 218 mg/dL — ABNORMAL HIGH (ref 125–200)
HDL: 40 mg/dL — ABNORMAL LOW (ref >=46)
LDL Cholesterol: 142 mg/dL — ABNORMAL HIGH (ref <130)
Total CHOL/HDL Ratio: 5.5 Ratio — ABNORMAL HIGH (ref <=5.0)
Triglycerides: 180 mg/dL — ABNORMAL HIGH (ref <150)
VLDL: 36 mg/dL — ABNORMAL HIGH (ref <30)

## 2015-08-15 LAB — HEPATITIS C ANTIBODY: HCV Ab: NEGATIVE

## 2015-08-15 LAB — TSH: TSH: 0.82 mIU/L

## 2015-08-15 LAB — POC MICROSCOPIC URINALYSIS (UMFC): Mucus: ABSENT

## 2015-08-15 LAB — HIV ANTIBODY (ROUTINE TESTING W REFLEX): HIV: NONREACTIVE

## 2015-08-15 MED ORDER — OXYCODONE-ACETAMINOPHEN 7.5-325 MG PO TABS: 0.5000 | ORAL_TABLET | Freq: Three times a day (TID) | ORAL | Status: DC | PRN

## 2015-08-15 MED ORDER — ZOSTER VACCINE LIVE 19400 UNT/0.65ML ~~LOC~~ SUSR: 0.6500 mL | Freq: Once | SUBCUTANEOUS | Status: DC

## 2015-08-15 NOTE — Patient Instructions (Signed)
     IF you received an x-ray today, you will receive an invoice from [facility]. Please contact [facility] at [phone] with questions or concerns regarding your invoice.   IF you received labwork today, you will receive an invoice from [facility]. Please contact [facility] at [phone] with questions or concerns regarding your invoice.   Our billing staff will not be able to assist you with questions regarding bills from these companies.  You will be contacted with the lab results as soon as they are available. The fastest way to get your results is to activate your My Chart account. Instructions are located on the last page of this paperwork. If you have not heard from us regarding the results in 2 weeks, please contact this office.      

## 2015-08-15 NOTE — Telephone Encounter (Signed)
Called patient to let her know that Medicare will not pay for the vaccine Td, the vaccine has to be associated with a wound. She will come back 08/16/2015 to the [facility] appointment center to sign the ABN, I advised her if Medicare does not pay she will have to pay.

## 2015-08-15 NOTE — Progress Notes (Signed)
Patient ID: Alexandra [name], female    DOB: 1950-12-21, 65 y.o.   MRN: [id number]  PCP: Caisley Baxendale, PA-C  Subjective:   Chief Complaint  Patient presents with  . Follow-up    right leg  . Medication Refill    patient will discuss    HPI Presents for evaluation of chronic pain.  She saw ortho in late winter. PT was recommended, but she had to stop due to increased pain. She and I have previously discussed the need for formal Pain Management if orthopedics didn't have anything else to offer her.    Review of Systems  Constitutional: Negative.   HENT: Negative.   Eyes: Negative.   Respiratory: Negative.   Cardiovascular: Negative.   Gastrointestinal: Negative.   Endocrine: Negative.   Genitourinary: Negative.   Musculoskeletal: Positive for myalgias, arthralgias and gait problem.       RIGHT leg  Skin: Negative.   Allergic/Immunologic: Negative.   Neurological: Positive for dizziness (yesterday. Resolved.). Negative for weakness, light-headedness and headaches.  Hematological: Negative for adenopathy. Does not bruise/bleed easily.       Patient Active Problem List   Diagnosis Date Noted  . Femoral neck fracture ([place]) 07/16/2015  . Chronic pain syndrome 07/16/2015  . Primary localized osteoarthrosis of pelvic region 07/16/2015  . Anxiety state 07/16/2015  . Palpitations 07/16/2015  . Vitamin D deficiency 05/16/2015  . Aortic stenosis with mitral and aortic insufficiency 07/29/2014  . Hypertension 07/29/2014  . Hyperlipidemia 07/29/2014     Prior to Admission medications   Medication Sig Start Date End Date Taking? Authorizing Provider  bisoprolol-hydrochlorothiazide [facility]) 10-6.25 MG per tablet  07/24/14  Yes Historical Provider, MD  losartan (COZAAR) 50 MG tablet Take 50 mg by mouth daily. 05/09/15  Yes Historical Provider, MD  oxyCODONE-acetaminophen (PERCOCET) 7.5-325 MG tablet Take 0.5-1 tablets by mouth every 8 (eight) hours as needed for severe pain.  08/01/15  Yes Neil Brickell, PA-C  Vitamin D, Cholecalciferol, 1000 units CAPS Take 1,000 Units by mouth daily.   Yes Historical Provider, MD  ALPRAZolam Duanne Moron) 1 MG tablet Take 1 mg by mouth 3 (three) times daily. Reported on 08/15/2015 07/11/14   Historical Provider, MD     Allergies  Allergen Reactions  . Codeine Nausea Only       Objective:  Physical Exam  Constitutional: She is oriented to person, place, and time. She appears well-developed and well-nourished. She is active and cooperative. No distress.  BP 122/94 mmHg  Pulse 77  Temp(Src) 98.5 F (36.9 C) (Oral)  Resp 16  Ht 5\' 5"  (1.651 m)  Wt 154 lb 9.6 oz (70.126 kg)  BMI 25.73 kg/m2  SpO2 98%  HENT:  Head: Normocephalic and atraumatic.  Right Ear: Hearing normal.  Left Ear: Hearing normal.  Eyes: Conjunctivae are normal. No scleral icterus.  Neck: Normal range of motion. Neck supple. No thyromegaly present.  Cardiovascular: Normal rate and regular rhythm.   Murmur heard.  Systolic murmur is present with a grade of 3/6  Pulses:      Radial pulses are 2+ on the right side, and 2+ on the left side.  Pulmonary/Chest: Effort normal and breath sounds normal.  Lymphadenopathy:       Head (right side): No tonsillar, no preauricular, no posterior auricular and no occipital adenopathy present.       Head (left side): No tonsillar, no preauricular, no posterior auricular and no occipital adenopathy present.    She has no cervical adenopathy.  Right: No supraclavicular adenopathy present.       Left: No supraclavicular adenopathy present.  Neurological: She is alert and oriented to person, place, and time. No sensory deficit.  Skin: Skin is warm, dry and intact. No rash noted. No cyanosis or erythema. Nails show no clubbing.  Psychiatric: She has a normal mood and affect. Her speech is normal and behavior is normal.           Assessment & Plan:   1. Chronic pain syndrome I will continue to prescribe this dose  of opiates until she is established with Pain Management. - oxyCODONE-acetaminophen (PERCOCET) 7.5-325 MG tablet; Take 0.5-1 tablets by mouth every 8 (eight) hours as needed for severe pain.  Dispense: 90 tablet; Refill: 0 - Ambulatory referral to Pain Clinic  2. Essential hypertension Controlled. Continue current treatment. She will let me know when she needs a refill. - CBC with Differential/Platelet - TSH - POCT urinalysis dipstick - POCT Microscopic Urinalysis ([facility])  3. Hyperlipidemia Await lab results. - Lipid panel  4. Vitamin D deficiency She isn't sure if she's had one previously. Looks like 2001, based on chart review, revealed marked osteopenia. - DG Bone Density; Future  5. Need for TD vaccine Td administered.  6. Need for pneumococcal vaccination Will need Pneumovax-23 in 1 year. - Pneumococcal conjugate vaccine 13-valent IM  7. Screening for HIV (human immunodeficiency virus) - HIV antibody  8. Need for hepatitis C screening test - Hepatitis C antibody  9. Screening for colon cancer - Ambulatory referral to Gastroenterology  10. Encounter for screening mammogram for breast cancer - MM Digital Screening; Future  11. Need for shingles vaccine - Zoster Vaccine Live, PF, (ZOSTAVAX) 60454 UNT/0.65ML injection; Inject 19,400 Units into the skin once.  Dispense: 1 each; Refill: 0  12. Estrogen deficiency See above. - DG Bone Density; Future   Return in about 6 months (around 02/15/2016).   Fara Chute, PA-C Physician Assistant-Certified [facility]

## 2015-08-17 ENCOUNTER — Encounter: Payer: Self-pay | Admitting: Internal Medicine

## 2015-09-04 DIAGNOSIS — G894 Chronic pain syndrome: Secondary | ICD-10-CM | POA: Diagnosis not present

## 2015-09-04 DIAGNOSIS — Z79891 Long term (current) use of opiate analgesic: Secondary | ICD-10-CM | POA: Diagnosis not present

## 2015-09-04 DIAGNOSIS — Z79899 Other long term (current) drug therapy: Secondary | ICD-10-CM | POA: Diagnosis not present

## 2015-09-04 DIAGNOSIS — M79606 Pain in leg, unspecified: Secondary | ICD-10-CM | POA: Diagnosis not present

## 2015-09-28 ENCOUNTER — Ambulatory Visit (AMBULATORY_SURGERY_CENTER): Payer: Self-pay | Admitting: *Deleted

## 2015-09-28 VITALS — Ht 65.5 in | Wt 157.6 lb

## 2015-09-28 DIAGNOSIS — Z1211 Encounter for screening for malignant neoplasm of colon: Secondary | ICD-10-CM

## 2015-09-28 NOTE — Progress Notes (Signed)
No egg or soy allergy known to patient  No issues with past sedation with any surgeries  or procedures, no intubation problems  No diet pills per patient No home 02 use per patient  No blood thinners per patient  Pt denies issues with constipation  emmi declined

## 2015-10-10 DIAGNOSIS — M79604 Pain in right leg: Secondary | ICD-10-CM | POA: Diagnosis not present

## 2015-10-11 DIAGNOSIS — Z79899 Other long term (current) drug therapy: Secondary | ICD-10-CM | POA: Diagnosis not present

## 2015-10-11 DIAGNOSIS — Z79891 Long term (current) use of opiate analgesic: Secondary | ICD-10-CM | POA: Diagnosis not present

## 2015-10-11 DIAGNOSIS — G894 Chronic pain syndrome: Secondary | ICD-10-CM | POA: Diagnosis not present

## 2015-10-11 DIAGNOSIS — M79606 Pain in leg, unspecified: Secondary | ICD-10-CM | POA: Diagnosis not present

## 2015-10-12 ENCOUNTER — Ambulatory Visit (AMBULATORY_SURGERY_CENTER): Payer: Medicare Other | Admitting: Internal Medicine

## 2015-10-12 ENCOUNTER — Encounter: Payer: Self-pay | Admitting: Physician Assistant

## 2015-10-12 ENCOUNTER — Encounter: Payer: Self-pay | Admitting: Internal Medicine

## 2015-10-12 VITALS — BP 119/67 | HR 70 | Temp 96.8°F | Resp 11 | Ht 65.5 in | Wt 157.0 lb

## 2015-10-12 DIAGNOSIS — K219 Gastro-esophageal reflux disease without esophagitis: Secondary | ICD-10-CM | POA: Diagnosis not present

## 2015-10-12 DIAGNOSIS — D125 Benign neoplasm of sigmoid colon: Secondary | ICD-10-CM

## 2015-10-12 DIAGNOSIS — Z1211 Encounter for screening for malignant neoplasm of colon: Secondary | ICD-10-CM | POA: Diagnosis not present

## 2015-10-12 DIAGNOSIS — R011 Cardiac murmur, unspecified: Secondary | ICD-10-CM | POA: Diagnosis not present

## 2015-10-12 DIAGNOSIS — G894 Chronic pain syndrome: Secondary | ICD-10-CM

## 2015-10-12 DIAGNOSIS — D123 Benign neoplasm of transverse colon: Secondary | ICD-10-CM

## 2015-10-12 DIAGNOSIS — I10 Essential (primary) hypertension: Secondary | ICD-10-CM | POA: Diagnosis not present

## 2015-10-12 MED ORDER — SODIUM CHLORIDE 0.9 % IV SOLN: 500.0000 mL | INTRAVENOUS | Status: DC

## 2015-10-12 NOTE — Patient Instructions (Addendum)
I found and removed 6 polyps that all look benign.  I will let you know pathology results and when to have another routine colonoscopy by mail.  I appreciate the opportunity to care for you. Gatha Mayer, MD, FACG   YOU HAD AN ENDOSCOPIC PROCEDURE TODAY AT [facility]:   Refer to the procedure report that was given to you for any specific questions about what was found during the examination.  If the procedure report does not answer your questions, please call your gastroenterologist to clarify.  If you requested that your care partner not be given the details of your procedure findings, then the procedure report has been included in a sealed envelope for you to review at your convenience later.  YOU SHOULD EXPECT: Some feelings of bloating in the abdomen. Passage of more gas than usual.  Walking can help get rid of the air that was put into your GI tract during the procedure and reduce the bloating. If you had a lower endoscopy (such as a colonoscopy or flexible sigmoidoscopy) you may notice spotting of blood in your stool or on the toilet paper. If you underwent a bowel prep for your procedure, you may not have a normal bowel movement for a few days.  Please Note:  You might notice some irritation and congestion in your nose or some drainage.  This is from the oxygen used during your procedure.  There is no need for concern and it should clear up in a day or so.  SYMPTOMS TO REPORT IMMEDIATELY:   Following lower endoscopy (colonoscopy or flexible sigmoidoscopy):  Excessive amounts of blood in the stool  Significant tenderness or worsening of abdominal pains  Swelling of the abdomen that is new, acute  Fever of 100F or higher   For urgent or emergent issues, a gastroenterologist can be reached at any hour by calling [phone].   DIET: Your first meal following the procedure should be a small meal and then it is ok to progress to your normal diet. Heavy  or fried foods are harder to digest and may make you feel nauseous or bloated.  Likewise, meals heavy in dairy and vegetables can increase bloating.  Drink plenty of fluids but you should avoid alcoholic beverages for 24 hours.  ACTIVITY:  You should plan to take it easy for the rest of today and you should NOT DRIVE or use heavy machinery until tomorrow (because of the sedation medicines used during the test).     Please read all handouts given to you today by your recovery RN.  FOLLOW UP: Our staff will call the number listed on your records the next business day following your procedure to check on you and address any questions or concerns that you may have regarding the information given to you following your procedure. If we do not reach you, we will leave a message.  However, if you are feeling well and you are not experiencing any problems, there is no need to return our call.  We will assume that you have returned to your regular daily activities without incident.  If any biopsies were taken you will be contacted by phone or by letter within the next 1-3 weeks.  Please call us at [phone] if you have not heard about the biopsies in 3 weeks.    SIGNATURES/CONFIDENTIALITY: You and/or your care partner have signed paperwork which will be entered into your electronic medical record.  These signatures attest to the  fact that that the information above on your After Visit Summary has been reviewed and is understood.  Full responsibility of the confidentiality of this discharge information lies with you and/or your care-partner.  Thank you for letting us take care of your healthcare needs today.

## 2015-10-12 NOTE — Progress Notes (Signed)
Called to room to assist during endoscopic procedure.  Patient ID and intended procedure confirmed with present staff. Received instructions for my participation in the procedure from the performing physician.  

## 2015-10-12 NOTE — Op Note (Signed)
[place] Patient Name: Alexandra [name] Procedure Date: 10/12/2015 9:36 AM MRN: [id number] Endoscopist: Gatha Mayer , MD Age: 65 Referring MD:  Date of Birth: 24-Jul-1950 Gender: Female Account #: [id number] Procedure:                Colonoscopy Indications:              Screening for colorectal malignant neoplasm, Last                            colonoscopy: 2003 Medicines:                Propofol per Anesthesia, Monitored Anesthesia Care Procedure:                Pre-Anesthesia Assessment:                           - Prior to the procedure, a History and Physical                            was performed, and patient medications and                            allergies were reviewed. The patient's tolerance of                            previous anesthesia was also reviewed. The risks                            and benefits of the procedure and the sedation                            options and risks were discussed with the patient.                            All questions were answered, and informed consent                            was obtained. Prior Anticoagulants: The patient has                            taken no previous anticoagulant or antiplatelet                            agents. ASA Grade Assessment: III - A patient with                            severe systemic disease. After reviewing the risks                            and benefits, the patient was deemed in                            satisfactory condition to undergo the procedure.  After obtaining informed consent, the colonoscope                            was passed under direct vision. Throughout the                            procedure, the patient's blood pressure, pulse, and                            oxygen saturations were monitored continuously. The                            Model CF-HQ190L [id number]) scope was introduced                            through the anus  and advanced to the the cecum,                            identified by appendiceal orifice and ileocecal                            valve. The colonoscopy was performed without                            difficulty. The patient tolerated the procedure                            well. The quality of the bowel preparation was                            excellent. The ileocecal valve, appendiceal                            orifice, and rectum were photographed. The bowel                            preparation used was Miralax. Scope In: 9:53:41 AM Scope Out: 10:12:10 AM Scope Withdrawal Time: 0 hours 15 minutes 35 seconds  Total Procedure Duration: 0 hours 18 minutes 29 seconds  Findings:                 The perianal and digital rectal examinations were                            normal.                           Five sessile polyps were found in the sigmoid colon                            and transverse colon. The polyps were 4 to 7 mm in                            size. These polyps were removed with a cold snare.  Resection and retrieval were complete. Verification                            of patient identification for the specimen was                            done. Estimated blood loss was minimal.                           A 2 mm polyp was found in the transverse colon. The                            polyp was sessile. The polyp was removed with a                            cold biopsy forceps. Resection and retrieval were                            complete. Verification of patient identification                            for the specimen was done. Estimated blood loss was                            minimal.                           Multiple large-mouthed diverticula were found in                            the sigmoid colon. There was no evidence of                            diverticular bleeding.                           The exam was otherwise  without abnormality on                            direct and retroflexion views. Complications:            No immediate complications. Estimated Blood Loss:     Estimated blood loss was minimal. Impression:               - Five 4 to 7 mm polyps in the sigmoid colon and in                            the transverse colon, removed with a cold snare.                            Resected and retrieved.                           - One 2 mm polyp in the transverse colon, removed  with a cold biopsy forceps. Resected and retrieved.                           - Moderate diverticulosis in the sigmoid colon.                            There was no evidence of diverticular bleeding.                           - The examination was otherwise normal on direct                            and retroflexion views. Recommendation:           - Patient has a contact number available for                            emergencies. The signs and symptoms of potential                            delayed complications were discussed with the                            patient. Return to normal activities tomorrow.                            Written discharge instructions were provided to the                            patient.                           - Continue present medications.                           - Resume previous diet.                           - Repeat colonoscopy is recommended. The                            colonoscopy date will be determined after pathology                            results from today's exam become available for                            review. Gatha Mayer, MD 10/12/2015 10:25:00 AM This report has been signed electronically.

## 2015-10-12 NOTE — Progress Notes (Signed)
To pcu vss patent aw reprot to rn

## 2015-10-13 ENCOUNTER — Telehealth: Payer: Self-pay | Admitting: *Deleted

## 2015-10-13 NOTE — Telephone Encounter (Signed)
No answer, left message to call if questions or concerns., 

## 2015-10-19 ENCOUNTER — Encounter: Payer: Self-pay | Admitting: Internal Medicine

## 2015-10-19 DIAGNOSIS — Z860101 Personal history of adenomatous and serrated colon polyps: Secondary | ICD-10-CM

## 2015-10-19 DIAGNOSIS — Z86010 Personal history of colonic polyps: Secondary | ICD-10-CM

## 2015-10-19 HISTORY — DX: Personal history of colonic polyps: Z86.010

## 2015-10-19 HISTORY — DX: Personal history of adenomatous and serrated colon polyps: Z86.0101

## 2015-10-19 NOTE — Progress Notes (Signed)
5 adenomas max 7 mm Recall 2020

## 2015-11-08 DIAGNOSIS — M25569 Pain in unspecified knee: Secondary | ICD-10-CM | POA: Diagnosis not present

## 2015-11-08 DIAGNOSIS — Z79899 Other long term (current) drug therapy: Secondary | ICD-10-CM | POA: Diagnosis not present

## 2015-11-08 DIAGNOSIS — G894 Chronic pain syndrome: Secondary | ICD-10-CM | POA: Diagnosis not present

## 2015-11-08 DIAGNOSIS — Z79891 Long term (current) use of opiate analgesic: Secondary | ICD-10-CM | POA: Diagnosis not present

## 2015-11-08 DIAGNOSIS — M79606 Pain in leg, unspecified: Secondary | ICD-10-CM | POA: Diagnosis not present

## 2015-11-13 ENCOUNTER — Telehealth: Payer: Self-pay

## 2015-11-13 NOTE — Telephone Encounter (Signed)
Pt states she has enough meds until tomorrow// needs refill for high blood preasure meds  [facility]- [address].  Thank you,  Please call when ready for pick up [phone]

## 2015-11-14 MED ORDER — BISOPROLOL-HYDROCHLOROTHIAZIDE 10-6.25 MG PO TABS: 1.0000 | ORAL_TABLET | Freq: Every day | ORAL | 0 refills | Status: DC

## 2015-11-14 MED ORDER — LOSARTAN POTASSIUM 50 MG PO TABS: 50.0000 mg | ORAL_TABLET | Freq: Every day | ORAL | 0 refills | Status: DC

## 2015-11-14 NOTE — Telephone Encounter (Signed)
Verified which meds needed and sent RFs of both losartan and Ziac.

## 2015-12-06 DIAGNOSIS — Z79899 Other long term (current) drug therapy: Secondary | ICD-10-CM | POA: Diagnosis not present

## 2015-12-06 DIAGNOSIS — M25569 Pain in unspecified knee: Secondary | ICD-10-CM | POA: Diagnosis not present

## 2015-12-06 DIAGNOSIS — G894 Chronic pain syndrome: Secondary | ICD-10-CM | POA: Diagnosis not present

## 2015-12-06 DIAGNOSIS — M179 Osteoarthritis of knee, unspecified: Secondary | ICD-10-CM | POA: Diagnosis not present

## 2015-12-06 DIAGNOSIS — Z79891 Long term (current) use of opiate analgesic: Secondary | ICD-10-CM | POA: Diagnosis not present

## 2015-12-09 ENCOUNTER — Encounter: Payer: Self-pay | Admitting: Physician Assistant

## 2015-12-09 DIAGNOSIS — M1711 Unilateral primary osteoarthritis, right knee: Secondary | ICD-10-CM | POA: Insufficient documentation

## 2016-01-03 DIAGNOSIS — M79606 Pain in leg, unspecified: Secondary | ICD-10-CM | POA: Diagnosis not present

## 2016-01-03 DIAGNOSIS — M25569 Pain in unspecified knee: Secondary | ICD-10-CM | POA: Diagnosis not present

## 2016-01-03 DIAGNOSIS — Z79891 Long term (current) use of opiate analgesic: Secondary | ICD-10-CM | POA: Diagnosis not present

## 2016-01-03 DIAGNOSIS — Z79899 Other long term (current) drug therapy: Secondary | ICD-10-CM | POA: Diagnosis not present

## 2016-01-03 DIAGNOSIS — G894 Chronic pain syndrome: Secondary | ICD-10-CM | POA: Diagnosis not present

## 2016-01-15 ENCOUNTER — Other Ambulatory Visit: Payer: Self-pay | Admitting: Physician Assistant

## 2016-01-31 DIAGNOSIS — M25569 Pain in unspecified knee: Secondary | ICD-10-CM | POA: Diagnosis not present

## 2016-01-31 DIAGNOSIS — G894 Chronic pain syndrome: Secondary | ICD-10-CM | POA: Diagnosis not present

## 2016-01-31 DIAGNOSIS — Z79899 Other long term (current) drug therapy: Secondary | ICD-10-CM | POA: Diagnosis not present

## 2016-01-31 DIAGNOSIS — M1990 Unspecified osteoarthritis, unspecified site: Secondary | ICD-10-CM | POA: Diagnosis not present

## 2016-01-31 DIAGNOSIS — Z79891 Long term (current) use of opiate analgesic: Secondary | ICD-10-CM | POA: Diagnosis not present

## 2016-01-31 DIAGNOSIS — M79606 Pain in leg, unspecified: Secondary | ICD-10-CM | POA: Diagnosis not present

## 2016-02-12 ENCOUNTER — Other Ambulatory Visit: Payer: Self-pay | Admitting: Family Medicine

## 2016-02-20 ENCOUNTER — Ambulatory Visit (INDEPENDENT_AMBULATORY_CARE_PROVIDER_SITE_OTHER): Payer: Medicare Other | Admitting: Physician Assistant

## 2016-02-20 ENCOUNTER — Encounter: Payer: Self-pay | Admitting: Physician Assistant

## 2016-02-20 VITALS — BP 180/86 | HR 75 | Temp 98.8°F | Resp 18 | Ht 65.5 in | Wt 156.0 lb

## 2016-02-20 DIAGNOSIS — I10 Essential (primary) hypertension: Secondary | ICD-10-CM | POA: Diagnosis not present

## 2016-02-20 DIAGNOSIS — R350 Frequency of micturition: Secondary | ICD-10-CM | POA: Diagnosis not present

## 2016-02-20 DIAGNOSIS — R002 Palpitations: Secondary | ICD-10-CM | POA: Diagnosis not present

## 2016-02-20 DIAGNOSIS — E785 Hyperlipidemia, unspecified: Secondary | ICD-10-CM | POA: Diagnosis not present

## 2016-02-20 DIAGNOSIS — Z23 Encounter for immunization: Secondary | ICD-10-CM | POA: Diagnosis not present

## 2016-02-20 LAB — POCT URINALYSIS DIP (MANUAL ENTRY)
BILIRUBIN UA: NEGATIVE
BILIRUBIN UA: NEGATIVE
Glucose, UA: NEGATIVE
Nitrite, UA: NEGATIVE
Protein Ur, POC: NEGATIVE
SPEC GRAV UA: 1.015
Urobilinogen, UA: 0.2
pH, UA: 7

## 2016-02-20 LAB — GLUCOSE, POCT (MANUAL RESULT ENTRY): POC GLUCOSE: 104 mg/dL — AB (ref 70–99)

## 2016-02-20 MED ORDER — OXYBUTYNIN CHLORIDE ER 10 MG PO TB24: 10.0000 mg | ORAL_TABLET | Freq: Every day | ORAL | 3 refills | Status: DC

## 2016-02-20 NOTE — Patient Instructions (Addendum)
Please check your blood pressure one time each day. Record the results. If the readings are consistently >140/90, please let me or your cardiologist know.      IF you received an x-ray today, you will receive an invoice from [facility]. Please contact [facility] at [phone] with questions or concerns regarding your invoice.   IF you received labwork today, you will receive an invoice from [employer]. Please contact [facility] at [phone] with questions or concerns regarding your invoice.   Our billing staff will not be able to assist you with questions regarding bills from these companies.  You will be contacted with the lab results as soon as they are available. The fastest way to get your results is to activate your My Chart account. Instructions are located on the last page of this paperwork. If you have not heard from [place] regarding the results in 2 weeks, please contact this office.

## 2016-02-20 NOTE — Progress Notes (Signed)
Patient ID: Alexandra [name], female    DOB: [birth year], 65 y.o.   MRN: [id number]  PCP: Harrison Mons, PA-C  Chief Complaint  Patient presents with  . Follow-up    6 MONTH    Subjective:   Presents for evaluation of HTN and hyperlipidemia, but today reports several months of increased urinary frequency.  Symptoms are primarily at night, and since her last visit with me notes that she isn't getting much sleep at all due to having to urinate so frequently. Thinks it's less during the day, but possibly just doesn't bother her as much. No burning with urination. No hematuria. No urgency, just a near constant sense that her bladder is very full. When she does urinate, she only has small volumes. No fever/chills. No low back or pelvic pain. No nausea/vomiting.  Tolerating her other medicaitons well, without adverse effects. Intermittently checks her BP at home and notes it's "normal." Last reading was 4 days ago, 130's/80's. Sometimes SBP <100.    Review of Systems As above.    Patient Active Problem List   Diagnosis Date Noted  . Urinary frequency 02/20/2016  . Right knee DJD 12/09/2015  . Hx of adenomatous colonic polyps 10/19/2015  . Osteopenia 08/15/2015  . Femoral neck fracture ([place]) 07/16/2015  . Chronic pain syndrome 07/16/2015  . Primary localized osteoarthrosis of pelvic region 07/16/2015  . Anxiety state 07/16/2015  . Palpitations 07/16/2015  . Vitamin D deficiency 05/16/2015  . Aortic stenosis with mitral and aortic insufficiency 07/29/2014  . Hypertension 07/29/2014  . Hyperlipidemia 07/29/2014     Prior to Admission medications   Medication Sig Start Date End Date Taking? Authorizing Provider  atorvastatin (LIPITOR) 20 MG tablet take 1 tablet by mouth every evening 01/16/16  Yes Quinlin Conant, PA-C  bisoprolol-hydrochlorothiazide [facility]) 10-6.25 MG tablet take 1 tablet by mouth once daily 02/16/16  Yes Rita Vialpando, PA-C  losartan (COZAAR) 50 MG tablet take 1  tablet by mouth once daily 02/16/16  Yes Kalid Ghan, PA-C  Vitamin D, Cholecalciferol, 1000 units CAPS Take 1,000 Units by mouth daily. Reported on 09/28/2015   Yes Historical Provider, MD  Zoster Vaccine Live, PF, (ZOSTAVAX) 16109 UNT/0.65ML injection Inject 19,400 Units into the skin once. 08/15/15  Yes Deneice Wack, PA-C  ALPRAZolam (XANAX) 1 MG tablet Take 1 mg by mouth 3 (three) times daily. Reported on 09/28/2015 07/11/14   Historical Provider, MD  oxyCODONE-acetaminophen (PERCOCET) 10-325 MG tablet  02/05/16   Historical Provider, MD     Allergies  Allergen Reactions  . Codeine Nausea Only       Objective:  Physical Exam  Constitutional: She is oriented to person, place, and time. She appears well-developed and well-nourished. She is active and cooperative. No distress.  BP (!) 180/86   Pulse 75   Temp 98.8 F (37.1 C) (Oral)   Resp 18   Ht 5' 5.5" (1.664 m)   Wt 156 lb (70.8 kg)   SpO2 97%   BMI 25.56 kg/m   HENT:  Head: Normocephalic and atraumatic.  Right Ear: Hearing normal.  Left Ear: Hearing normal.  Eyes: Conjunctivae are normal. No scleral icterus.  Neck: Normal range of motion. Neck supple. No thyromegaly present.  Cardiovascular: Normal rate and regular rhythm.   Murmur heard.  Systolic murmur is present with a grade of 2/6  Pulses:      Radial pulses are 2+ on the right side, and 2+ on the left side.  Murmur radiates to both carotid arteries  Pulmonary/Chest: Effort normal and breath sounds normal.  Lymphadenopathy:       Head (right side): No tonsillar, no preauricular, no posterior auricular and no occipital adenopathy present.       Head (left side): No tonsillar, no preauricular, no posterior auricular and no occipital adenopathy present.    She has no cervical adenopathy.       Right: No supraclavicular adenopathy present.       Left: No supraclavicular adenopathy present.  Neurological: She is alert and oriented to person, place, and time. No  sensory deficit.  Skin: Skin is warm, dry and intact. No rash noted. No cyanosis or erythema. Nails show no clubbing.  Psychiatric: She has a normal mood and affect. Her speech is normal and behavior is normal.    EKG reviewed with Dr. Tamala Julian. LBBB and ST depression in leads I, II, AVL, V5 and V6. Unchanged from previous tracings.    Results for orders placed or performed in visit on 02/20/16  POCT glucose (manual entry)  Result Value Ref Range   POC Glucose 104 (A) 70 - 99 mg/dl  POCT urinalysis dipstick  Result Value Ref Range   Color, UA yellow yellow   Clarity, UA clear clear   Glucose, UA negative negative   Bilirubin, UA negative negative   Ketones, POC UA negative negative   Spec Grav, UA 1.015    Blood, UA trace-intact (A) negative   pH, UA 7.0    Protein Ur, POC negative negative   Urobilinogen, UA 0.2    Nitrite, UA Negative Negative   Leukocytes, UA Trace (A) Negative        Assessment & Plan:   1. Essential hypertension Uncontrolled today, but normal at home 4 days ago. Consider lack of restorative sleep, anxiety, urinary discomfort and smoking as contributors. Check home BP daily. If consistently >140/90, let me know, otherwise follow-up 1 month. - Comprehensive metabolic panel  2. Hyperlipidemia, unspecified hyperlipidemia type Await labs. Adjust regimen as indicated by results. - Lipid panel  3. Urinary frequency Given duration of symptoms, doubt UTI. No glucosuria and glucose is normal today. Await remaining labs. Start oxybutynin for presumed overactive bladder. Consider constipation if symptoms not improved in 1 month. - Urine Microscopic - POCT glucose (manual entry) - POCT urinalysis dipstick - Urine culture - oxybutynin (DITROPAN-XL) 10 MG 24 hr tablet; Take 1 tablet (10 mg total) by mouth at bedtime.  Dispense: 90 tablet; Refill: 3  4. Palpitations Not new. Not associated with CP, SOB, HA, dizziness. EKG stable.  - TSH - EKG 12-Lead - POCT  glucose (manual entry)  5. Need for prophylactic vaccination and inoculation against influenza - Flu Vaccine QUAD 36+ mos IM   Fara Chute, PA-C Physician Assistant-Certified [facility]

## 2016-02-20 NOTE — Progress Notes (Signed)
Subjective:    Patient ID: Alexandra [name], female    DOB: 08/11/1950, 65 y.o.   MRN: [id number]  Chief Complaint  Patient presents with  . Follow-up    6 MONTH   HPI: Presents today for 98-month follow-up of hypertension and hyperlipidemia. Patient is currently taking Atorvastatin 20 mg daily for HLD and Bisoprolol-HCTZ 10-6.25 mg and Losartan 50 mg daily for HTN. States she checks her blood pressure at home occasionally with averages around 130/75 mmHg but a few recently low readings of about 98/60 mmHg. Denies chest pain, visual changes, SOB, headaches. States she gets palpitations on and off but has had those for a while.   Endorses trouble sleeping at night because of increased urination. States she has been noticing these over the past year but over the past few months it has increased. Also urinates frequently during the day but states the night urination is what has recently changed and become more frequently, urinating up to 10+ times a night. States it is often only dribbling when she urinates and feels as if she is not completely emptying her bladder. Denies dysuria, hematuria, unintentional leakage, loss of bladder function, bulging sensation. Denies fever, chills, nausea, vomiting, or flank pain. States she wears a bladder pad daily and at night to prevent accidents as sometimes she has leakage of a few drops before making it to the bathroom. Denies changes in urine color or odor.  States she is tolerating her Atorvastatin, denies side effects. Endorses taking 20 mg daily.  Chronic leg pain is followed by pain clinic. She attends once a month and states her pain is tolerable with current medication regimen.  Review of Systems  Constitutional: Negative for chills, diaphoresis, fatigue, fever and unexpected weight change.  HENT: Negative for congestion, ear discharge, ear pain, postnasal drip, sinus pain, sinus pressure and sore throat.   Eyes: Negative for pain, discharge, redness and  itching.  Respiratory: Negative for cough, chest tightness, shortness of breath and wheezing.   Cardiovascular: Positive for palpitations. Negative for chest pain.  Gastrointestinal: Negative for abdominal pain, blood in stool, constipation, diarrhea, nausea and vomiting.  Endocrine: Positive for polyuria. Negative for polydipsia and polyphagia.  Genitourinary: Positive for difficulty urinating and frequency. Negative for dysuria, enuresis, flank pain, hematuria, pelvic pain and vaginal bleeding.  Neurological: Negative for dizziness, syncope, weakness, light-headedness and headaches.  Psychiatric/Behavioral: Negative for dysphoric mood.   Allergies  Allergen Reactions  . Codeine Nausea Only   Patient Active Problem List   Diagnosis Date Noted  . Urinary frequency 02/20/2016  . Right knee DJD 12/09/2015  . Hx of adenomatous colonic polyps 10/19/2015  . Osteopenia 08/15/2015  . Femoral neck fracture ([place]) 07/16/2015  . Chronic pain syndrome 07/16/2015  . Primary localized osteoarthrosis of pelvic region 07/16/2015  . Anxiety state 07/16/2015  . Palpitations 07/16/2015  . Vitamin D deficiency 05/16/2015  . Aortic stenosis with mitral and aortic insufficiency 07/29/2014  . Hypertension 07/29/2014  . Hyperlipidemia 07/29/2014   Prior to Admission medications   Medication Sig Start Date End Date Taking? Authorizing Provider  atorvastatin (LIPITOR) 20 MG tablet take 1 tablet by mouth every evening 01/16/16  Yes Chelle Jeffery, PA-C  bisoprolol-hydrochlorothiazide [facility]) 10-6.25 MG tablet take 1 tablet by mouth once daily 02/16/16  Yes Chelle Jeffery, PA-C  losartan (COZAAR) 50 MG tablet take 1 tablet by mouth once daily 02/16/16  Yes Chelle Jeffery, PA-C  Vitamin D, Cholecalciferol, 1000 units CAPS Take 1,000 Units by mouth daily. Reported  on 09/28/2015   Yes Historical Provider, MD  Zoster Vaccine Live, PF, (ZOSTAVAX) 16109 UNT/0.65ML injection Inject 19,400 Units into the skin once.  08/15/15  Yes Chelle Jeffery, PA-C  ALPRAZolam (XANAX) 1 MG tablet Take 1 mg by mouth 3 (three) times daily. Reported on 09/28/2015 07/11/14   Historical Provider, MD  oxyCODONE-acetaminophen (PERCOCET) 10-325 MG tablet  02/05/16   Historical Provider, MD       Objective: Blood pressure (!) 182/100, pulse 75, temperature 98.8 F (37.1 C), temperature source Oral, resp. rate 18, height 5' 5.5" (1.664 m), weight 156 lb (70.8 kg), SpO2 97 %. First blood pressure reading of 194/86 mmHg, another repeat of 180/86 mmHg in clinic.   Physical Exam  Constitutional: She is oriented to person, place, and time. She appears well-developed and well-nourished.  HENT:  Head: Normocephalic and atraumatic.  Right Ear: External ear normal.  Left Ear: External ear normal.  Mouth/Throat: Oropharynx is clear and moist. No oropharyngeal exudate.  Eyes: Conjunctivae are normal. Pupils are equal, round, and reactive to light. Right eye exhibits no discharge and no exudate. Left eye exhibits no discharge and no exudate. Right conjunctiva is not injected. Left conjunctiva is not injected. No scleral icterus. Right pupil is round and reactive. Left pupil is round and reactive. Pupils are equal.  Neck: Normal range of motion. No neck rigidity. No tracheal deviation and normal range of motion present. No thyroid mass and no thyromegaly present.  Holosystolic blowing murmur with radiation to bilateral carotids.  Cardiovascular: Normal rate, regular rhythm and intact distal pulses.  Exam reveals no gallop, no distant heart sounds and no friction rub.   Murmur heard.  Systolic murmur is present with a grade of 3/6  Pulses:      Radial pulses are 2+ on the right side, and 2+ on the left side.  Holosystolic blowing murmur with radiation to carotids.  Pulmonary/Chest: Effort normal and breath sounds normal. No accessory muscle usage or stridor. No respiratory distress. She has no decreased breath sounds. She has no wheezes. She  has no rhonchi. She has no rales.  Lymphadenopathy:       Head (right side): No submental, no submandibular, no tonsillar, no preauricular and no posterior auricular adenopathy present.       Head (left side): No submental, no submandibular, no tonsillar, no preauricular and no posterior auricular adenopathy present.    She has no cervical adenopathy.  Neurological: She is alert and oriented to person, place, and time.  Skin: Skin is warm. No rash noted. She is diaphoretic. No erythema.  Psychiatric: Her speech is normal and behavior is normal. Judgment and thought content normal. Her mood appears anxious. Cognition and memory are normal.      Results for orders placed or performed in visit on 02/20/16  POCT glucose (manual entry)  Result Value Ref Range   POC Glucose 104 (A) 70 - 99 mg/dl  POCT urinalysis dipstick  Result Value Ref Range   Color, UA yellow yellow   Clarity, UA clear clear   Glucose, UA negative negative   Bilirubin, UA negative negative   Ketones, POC UA negative negative   Spec Grav, UA 1.015    Blood, UA trace-intact (A) negative   pH, UA 7.0    Protein Ur, POC negative negative   Urobilinogen, UA 0.2    Nitrite, UA Negative Negative   Leukocytes, UA Trace (A) Negative   EKG: EKG compared to one performed on 08/09/2014. LBBB and ST depression  in leads I, II, AVL, V5 and V6, unchanged from previous.     Assessment & Plan:  1. Essential hypertension Three measurements of elevated blood pressure today. However, seems as though it may be white coat hypertension considering patient's record of much lower home recordings. Lack of sleep, urinary frequency, and anxiety may also be contributors. Advised patient to check BP daily at home and keep log of measurements. RTC in 1 month for re-evaluation or sooner if multiple home readings > 140/90 mmHg - Comprehensive metabolic panel  2. Hyperlipidemia, unspecified hyperlipidemia type Currently controlled on Atorvastatin.  Will review lipid panel and re-evaluate dose and regimen based upon results. - Lipid panel  3. Urinary frequency UA unremarkable, no glysocuria or protein. POCT glucose mildly elevated. Urine culture pending, will update patient and re-evaluate plan based on review of results. For now, rx Oxybutynin 10 mg to be taken at night. Advised patient to RTC if symptoms are worsening, new symptoms arise, or not improving with medication and will re-evaluate. - Urine Microscopic - POCT glucose (manual entry) - POCT urinalysis dipstick - Urine culture - oxybutynin (DITROPAN-XL) 10 MG 24 hr tablet; Take 1 tablet (10 mg total) by mouth at bedtime.  Dispense: 90 tablet; Refill: 3  4. Palpitations Patient states these palpitations are typical for her. Denies chest pain, SOB, N/V, dizziness, headache, visual changes. POCT glucose mildly elevated. TSH pending. Advised patient to RTC if worsening symptoms. - TSH - EKG 12-Lead - POCT glucose (manual entry)  5. Need for prophylactic vaccination and inoculation against influenza - Flu Vaccine QUAD 36+ mos IM

## 2016-02-21 LAB — COMPREHENSIVE METABOLIC PANEL
ALBUMIN: 3.9 g/dL (ref 3.6–5.1)
ALT: 8 U/L (ref 6–29)
AST: 12 U/L (ref 10–35)
Alkaline Phosphatase: 102 U/L (ref 33–130)
BILIRUBIN TOTAL: 0.4 mg/dL (ref 0.2–1.2)
BUN: 13 mg/dL (ref 7–25)
CO2: 30 mmol/L (ref 20–31)
CREATININE: 0.85 mg/dL (ref 0.50–0.99)
Calcium: 9.4 mg/dL (ref 8.6–10.4)
Chloride: 104 mmol/L (ref 98–110)
GLUCOSE: 97 mg/dL (ref 65–99)
Potassium: 4.9 mmol/L (ref 3.5–5.3)
SODIUM: 140 mmol/L (ref 135–146)
Total Protein: 6.3 g/dL (ref 6.1–8.1)

## 2016-02-21 LAB — LIPID PANEL
CHOL/HDL RATIO: 3.6 ratio (ref <5.0)
Cholesterol: 158 mg/dL (ref <200)
HDL: 44 mg/dL — ABNORMAL LOW (ref >50)
LDL CALC: 95 mg/dL (ref <100)
Triglycerides: 94 mg/dL (ref <150)
VLDL: 19 mg/dL (ref <30)

## 2016-02-21 LAB — URINALYSIS, MICROSCOPIC ONLY
BACTERIA UA: NONE SEEN [HPF]
CASTS: NONE SEEN [LPF]
Crystals: NONE SEEN [HPF]
YEAST: NONE SEEN [HPF]

## 2016-02-21 LAB — TSH: TSH: 1.14 mIU/L

## 2016-02-22 LAB — URINE CULTURE: ORGANISM ID, BACTERIA: NO GROWTH

## 2016-02-26 ENCOUNTER — Encounter: Payer: Self-pay | Admitting: Physician Assistant

## 2016-02-28 DIAGNOSIS — G894 Chronic pain syndrome: Secondary | ICD-10-CM | POA: Diagnosis not present

## 2016-02-28 DIAGNOSIS — M25569 Pain in unspecified knee: Secondary | ICD-10-CM | POA: Diagnosis not present

## 2016-02-28 DIAGNOSIS — Z79891 Long term (current) use of opiate analgesic: Secondary | ICD-10-CM | POA: Diagnosis not present

## 2016-02-28 DIAGNOSIS — M79606 Pain in leg, unspecified: Secondary | ICD-10-CM | POA: Diagnosis not present

## 2016-02-28 DIAGNOSIS — M1990 Unspecified osteoarthritis, unspecified site: Secondary | ICD-10-CM | POA: Diagnosis not present

## 2016-02-28 DIAGNOSIS — Z79899 Other long term (current) drug therapy: Secondary | ICD-10-CM | POA: Diagnosis not present

## 2016-03-18 ENCOUNTER — Other Ambulatory Visit: Payer: Self-pay | Admitting: Physician Assistant

## 2016-03-25 DIAGNOSIS — I350 Nonrheumatic aortic (valve) stenosis: Secondary | ICD-10-CM

## 2016-03-25 HISTORY — DX: Nonrheumatic aortic (valve) stenosis: I35.0

## 2016-03-26 ENCOUNTER — Ambulatory Visit (INDEPENDENT_AMBULATORY_CARE_PROVIDER_SITE_OTHER): Payer: Medicare Other | Admitting: Physician Assistant

## 2016-03-26 ENCOUNTER — Encounter: Payer: Self-pay | Admitting: Physician Assistant

## 2016-03-26 VITALS — BP 136/76 | HR 70 | Temp 98.5°F | Resp 16 | Ht 65.5 in | Wt 151.2 lb

## 2016-03-26 DIAGNOSIS — M8580 Other specified disorders of bone density and structure, unspecified site: Secondary | ICD-10-CM

## 2016-03-26 DIAGNOSIS — R350 Frequency of micturition: Secondary | ICD-10-CM | POA: Diagnosis not present

## 2016-03-26 DIAGNOSIS — R059 Cough, unspecified: Secondary | ICD-10-CM

## 2016-03-26 DIAGNOSIS — R05 Cough: Secondary | ICD-10-CM

## 2016-03-26 DIAGNOSIS — I10 Essential (primary) hypertension: Secondary | ICD-10-CM

## 2016-03-26 MED ORDER — BENZONATATE 100 MG PO CAPS: 100.0000 mg | ORAL_CAPSULE | Freq: Three times a day (TID) | ORAL | 0 refills | Status: DC | PRN

## 2016-03-26 MED ORDER — AZELASTINE HCL 0.15 % NA SOLN: 2.0000 | Freq: Two times a day (BID) | NASAL | 0 refills | Status: DC

## 2016-03-26 MED ORDER — RANITIDINE HCL 150 MG PO TABS: 150.0000 mg | ORAL_TABLET | Freq: Two times a day (BID) | ORAL | 0 refills | Status: DC

## 2016-03-26 NOTE — Progress Notes (Signed)
Patient ID: Alexandra [name], female    DOB: 11-09-1950, 66 y.o.   MRN: [id number]  PCP: Harrison Mons, PA-C  Chief Complaint  Patient presents with  . Follow-up    urinary frequency has stopped, since starting medication     Subjective:   Presents for evaluation of frequent urination and elevated blood pressure.  At her visit 1 month ago, she was started on oxybutynin. The nocturia has resolved.  Since that visit, she has had a GI illness with nausea, vomiting and diarrhea. For about a week, she was not able to eat or drink, but she did not seek evaluation. She started drinking Pedialyte when her home BP was 58/38 and she felt dizzy.  The nausea, vomiting and diarrhea have resolved, except for vomiting that occurs at night when she lies down and starts coughing and sometimes gags.  She has taken 2 bottles of OTC Delsym without benefit. No fever, chills. Runny nose. No sore throat, ear pain, HA.   Review of Systems As above.    Patient Active Problem List   Diagnosis Date Noted  . Urinary frequency 02/20/2016  . Right knee DJD 12/09/2015  . Hx of adenomatous colonic polyps 10/19/2015  . Osteopenia 08/15/2015  . Femoral neck fracture ([place]) 07/16/2015  . Chronic pain syndrome 07/16/2015  . Primary localized osteoarthrosis of pelvic region 07/16/2015  . Anxiety state 07/16/2015  . Palpitations 07/16/2015  . Vitamin D deficiency 05/16/2015  . Aortic stenosis with mitral and aortic insufficiency 07/29/2014  . Hypertension 07/29/2014  . Hyperlipidemia 07/29/2014     Prior to Admission medications   Medication Sig Start Date End Date Taking? Authorizing Provider  atorvastatin (LIPITOR) 20 MG tablet take 1 tablet by mouth every evening 01/16/16  Yes Thorne Wirz, PA-C  bisoprolol-hydrochlorothiazide [facility]) 10-6.25 MG tablet take 1 tablet by mouth once daily 03/20/16  Yes Kaysie Michelini, PA-C  losartan (COZAAR) 50 MG tablet take 1 tablet by mouth once daily  03/20/16  Yes Angelito Hopping, PA-C  oxybutynin (DITROPAN-XL) 10 MG 24 hr tablet Take 1 tablet (10 mg total) by mouth at bedtime. 02/20/16  Yes Stellar Gensel, PA-C  oxyCODONE-acetaminophen (PERCOCET) 10-325 MG tablet  02/05/16  Yes Historical Provider, MD  Vitamin D, Cholecalciferol, 1000 units CAPS Take 1,000 Units by mouth daily. Reported on 09/28/2015   Yes Historical Provider, MD  ALPRAZolam Duanne Moron) 1 MG tablet Take 1 mg by mouth 3 (three) times daily. Reported on 09/28/2015 07/11/14   Historical Provider, MD  Zoster Vaccine Live, PF, (ZOSTAVAX) 09811 UNT/0.65ML injection Inject 19,400 Units into the skin once. Patient not taking: Reported on 03/26/2016 08/15/15   Harrison Mons, PA-C     Allergies  Allergen Reactions  . Codeine Nausea Only       Objective:  Physical Exam  Constitutional: She is oriented to person, place, and time. She appears well-developed and well-nourished. She is active and cooperative. No distress.  BP 136/76 (BP Location: Right Arm, Patient Position: Sitting, Cuff Size: Small)   Pulse 70   Temp 98.5 F (36.9 C) (Oral)   Resp 16   Ht 5' 5.5" (1.664 m)   Wt 151 lb 3.2 oz (68.6 kg)   SpO2 96%   BMI 24.78 kg/m   HENT:  Head: Normocephalic and atraumatic.  Right Ear: Hearing, tympanic membrane, external ear and ear canal normal.  Left Ear: Hearing, tympanic membrane, external ear and ear canal normal.  Nose: Rhinorrhea present. No mucosal edema.  Mouth/Throat: Oropharynx is  clear and moist and mucous membranes are normal. She has dentures.  Eyes: Conjunctivae are normal. No scleral icterus.  Neck: Normal range of motion. Neck supple. No thyromegaly present.  Cardiovascular: Normal rate and regular rhythm.   Murmur heard.  Systolic murmur is present with a grade of 2/6  Pulses:      Radial pulses are 2+ on the right side, and 2+ on the left side.  Pulmonary/Chest: Effort normal and breath sounds normal.  Lymphadenopathy:       Head (right side): No tonsillar,  no preauricular, no posterior auricular and no occipital adenopathy present.       Head (left side): No tonsillar, no preauricular, no posterior auricular and no occipital adenopathy present.    She has no cervical adenopathy.       Right: No supraclavicular adenopathy present.       Left: No supraclavicular adenopathy present.  Neurological: She is alert and oriented to person, place, and time. No sensory deficit.  Skin: Skin is warm, dry and intact. No rash noted. No cyanosis or erythema. Nails show no clubbing.  Psychiatric: She has a normal mood and affect. Her speech is normal and behavior is normal.    Wt Readings from Last 3 Encounters:  03/26/16 151 lb 3.2 oz (68.6 kg)  02/20/16 156 lb (70.8 kg)  10/12/15 157 lb (71.2 kg)          Assessment & Plan:   1. Essential hypertension Controlled. Continue to monitor.  2. Urinary frequency Controlled with oxybutynin.  3. Cough Encouraged smoking cessation. Likely combination of recent GI illness, smoker's cough and post-nasal drainage. - benzonatate (TESSALON) 100 MG capsule; Take 1-2 capsules (100-200 mg total) by mouth 3 (three) times daily as needed for cough.  Dispense: 40 capsule; Refill: 0 - Azelastine HCl 0.15 % SOLN; Place 2 sprays into both nostrils 2 (two) times daily.  Dispense: 30 mL; Refill: 0 - ranitidine (ZANTAC) 150 MG tablet; Take 1 tablet (150 mg total) by mouth 2 (two) times daily.  Dispense: 60 tablet; Refill: 0  4. Osteopenia, unspecified location Encouraged her to call imaging facility to schedule DEXA and mammogram.   Return in about 4 months (around 07/24/2016) for re-evaluation of blood pressure.    Fara Chute, PA-C Physician Assistant-Certified [facility]

## 2016-03-26 NOTE — Patient Instructions (Addendum)
Please call [facility] to schedule for bone density test. I would also like you to schedule a mammogram. [address], [place], [place] [phone]  Phone: [phone]     IF you received an x-ray today, you will receive an invoice from [facility]. Please contact [facility] at [phone] with questions or concerns regarding your invoice.   IF you received labwork today, you will receive an invoice from [facility]. Please contact [facility] at [phone] with questions or concerns regarding your invoice.   Our billing staff will not be able to assist you with questions regarding bills from these companies.  You will be contacted with the lab results as soon as they are available. The fastest way to get your results is to activate your My Chart account. Instructions are located on the last page of this paperwork. If you have not heard from [place] regarding the results in 2 weeks, please contact this office.     Did you know that you begin to benefit from quitting smoking within the first twenty minutes? It's TRUE.  At 20 minutes: -blood pressure decreases -pulse rate drops -body temperature of hands and feet increases  At 8 hours: -carbon monoxide level in blood drops to normal -oxygen level in blood increases to normal  At 24 hours: -the chance of heart attack decreases  At 48 hours: -nerve endings start regrowing -ability to smell and taste is enhanced  2 weeks-3 months: -circulation improves -walking becomes easier -lung function improves  1-9 months: -coughing, sinus congestion, fatigue and shortness of breath decreases  1 year: -excess risk of heart disease is decreased to HALF that of a smoker  5 years: Stroke risk is reduced to that of people who have never smoked  10 years: -risk of lung cancer drops to as little as half that of continuing smokers -risk of cancer of the mouth, throat, esophagus, bladder, kidney and pancreas  decreases -risk of ulcer decreases  15 years -risk of heart disease is now similar to that of people who have never smoked -risk of death returns to nearly the level of people who have never smoked

## 2016-03-28 DIAGNOSIS — G894 Chronic pain syndrome: Secondary | ICD-10-CM | POA: Diagnosis not present

## 2016-03-28 DIAGNOSIS — Z79891 Long term (current) use of opiate analgesic: Secondary | ICD-10-CM | POA: Diagnosis not present

## 2016-03-28 DIAGNOSIS — M1990 Unspecified osteoarthritis, unspecified site: Secondary | ICD-10-CM | POA: Diagnosis not present

## 2016-03-28 DIAGNOSIS — Z79899 Other long term (current) drug therapy: Secondary | ICD-10-CM | POA: Diagnosis not present

## 2016-03-28 DIAGNOSIS — M25569 Pain in unspecified knee: Secondary | ICD-10-CM | POA: Diagnosis not present

## 2016-03-28 DIAGNOSIS — M79606 Pain in leg, unspecified: Secondary | ICD-10-CM | POA: Diagnosis not present

## 2016-04-16 ENCOUNTER — Other Ambulatory Visit: Payer: Self-pay | Admitting: Physician Assistant

## 2016-04-16 DIAGNOSIS — R05 Cough: Secondary | ICD-10-CM

## 2016-04-16 DIAGNOSIS — R059 Cough, unspecified: Secondary | ICD-10-CM

## 2016-04-17 NOTE — Telephone Encounter (Signed)
Patient continues to have the lingering cough.  Advised to return if her coughing worsens.

## 2016-04-21 ENCOUNTER — Other Ambulatory Visit: Payer: Self-pay | Admitting: Physician Assistant

## 2016-04-22 NOTE — Telephone Encounter (Signed)
Meds ordered this encounter  Medications  . losartan (COZAAR) 50 MG tablet    Sig: take 1 tablet by mouth once daily    Dispense:  90 tablet    Refill:  1  . bisoprolol-hydrochlorothiazide (ZIAC) 10-6.25 MG tablet    Sig: take 1 tablet by mouth once daily    Dispense:  90 tablet    Refill:  1

## 2016-04-25 DIAGNOSIS — M1990 Unspecified osteoarthritis, unspecified site: Secondary | ICD-10-CM | POA: Diagnosis not present

## 2016-04-25 DIAGNOSIS — Z79891 Long term (current) use of opiate analgesic: Secondary | ICD-10-CM | POA: Diagnosis not present

## 2016-04-25 DIAGNOSIS — M25569 Pain in unspecified knee: Secondary | ICD-10-CM | POA: Diagnosis not present

## 2016-04-25 DIAGNOSIS — Z79899 Other long term (current) drug therapy: Secondary | ICD-10-CM | POA: Diagnosis not present

## 2016-04-25 DIAGNOSIS — M79606 Pain in leg, unspecified: Secondary | ICD-10-CM | POA: Diagnosis not present

## 2016-04-25 DIAGNOSIS — G894 Chronic pain syndrome: Secondary | ICD-10-CM | POA: Diagnosis not present

## 2016-05-21 ENCOUNTER — Telehealth: Payer: Self-pay | Admitting: Physician Assistant

## 2016-05-21 ENCOUNTER — Ambulatory Visit (INDEPENDENT_AMBULATORY_CARE_PROVIDER_SITE_OTHER): Payer: Medicare Other | Admitting: Physician Assistant

## 2016-05-21 ENCOUNTER — Ambulatory Visit (INDEPENDENT_AMBULATORY_CARE_PROVIDER_SITE_OTHER): Payer: Medicare Other

## 2016-05-21 ENCOUNTER — Encounter: Payer: Self-pay | Admitting: Physician Assistant

## 2016-05-21 VITALS — BP 158/74 | HR 74 | Temp 98.7°F | Resp 18 | Ht 65.5 in | Wt 153.0 lb

## 2016-05-21 DIAGNOSIS — J040 Acute laryngitis: Secondary | ICD-10-CM | POA: Diagnosis not present

## 2016-05-21 DIAGNOSIS — R05 Cough: Secondary | ICD-10-CM

## 2016-05-21 DIAGNOSIS — J449 Chronic obstructive pulmonary disease, unspecified: Secondary | ICD-10-CM | POA: Diagnosis not present

## 2016-05-21 DIAGNOSIS — I10 Essential (primary) hypertension: Secondary | ICD-10-CM | POA: Diagnosis not present

## 2016-05-21 DIAGNOSIS — R059 Cough, unspecified: Secondary | ICD-10-CM

## 2016-05-21 MED ORDER — AZITHROMYCIN 250 MG PO TABS: ORAL_TABLET | ORAL | 0 refills | Status: DC

## 2016-05-21 NOTE — Telephone Encounter (Signed)
Spoke with patient re: CXR reading  COPD. Coarse lung markings in the inferolateral aspect of the right middle lobe likely reflects atelectasis though pneumonia is not excluded. Followup PA and lateral chest X-ray is recommended in 3-4 weeks following trial of antibiotic therapy to ensure resolution and exclude underlying malignancy.  Thoracic aortic atherosclerosis.  Meds ordered this encounter  Medications  . azithromycin (ZITHROMAX) 250 MG tablet    Sig: Take 2 tabs PO x 1 dose, then 1 tab PO QD x 4 days    Dispense:  6 tablet    Refill:  0    Order Specific Question:   Supervising Provider    Answer:   Brigitte Pulse, EVA N [[id number]]   Transferred to Ms. ARollene Rotunda to schedule follow-up visit in 4 weeks for repeat CXR.

## 2016-05-21 NOTE — Patient Instructions (Addendum)
Continue using the nasal spray and add an OTC Claritin (10 mg one time each day) and OTC Mucinex (not -D or -DM).  Continue to get plenty of rest and drink plenty of liquids.    IF you received an x-ray today, you will receive an invoice from [facility]. Please contact [facility] at [phone] with questions or concerns regarding your invoice.   IF you received labwork today, you will receive an invoice from [place]. Please contact [facility] at [phone] with questions or concerns regarding your invoice.   Our billing staff will not be able to assist you with questions regarding bills from these companies.  You will be contacted with the lab results as soon as they are available. The fastest way to get your results is to activate your My Chart account. Instructions are located on the last page of this paperwork. If you have not heard from [place] regarding the results in 2 weeks, please contact this office.    Did you know that you begin to benefit from quitting smoking within the first twenty minutes? It's TRUE.  At 20 minutes: -blood pressure decreases -pulse rate drops -body temperature of hands and feet increases  At 8 hours: -carbon monoxide level in blood drops to normal -oxygen level in blood increases to normal  At 24 hours: -the chance of heart attack decreases  At 48 hours: -nerve endings start regrowing -ability to smell and taste is enhanced  2 weeks-3 months: -circulation improves -walking becomes easier -lung function improves  1-9 months: -coughing, sinus congestion, fatigue and shortness of breath decreases  1 year: -excess risk of heart disease is decreased to HALF that of a smoker  5 years: Stroke risk is reduced to that of people who have never smoked  10 years: -risk of lung cancer drops to as little as half that of continuing smokers -risk of cancer of the mouth, throat, esophagus, bladder, kidney and pancreas decreases -risk  of ulcer decreases  15 years -risk of heart disease is now similar to that of people who have never smoked -risk of death returns to nearly the level of people who have never smoked

## 2016-05-21 NOTE — Progress Notes (Signed)
Patient ID: Alexandra [name], female    DOB: 02-Aug-1950, 66 y.o.   MRN: [id number]  PCP: Harrison Mons, PA-C  Chief Complaint  Patient presents with  . Cough  . Sore Throat    Subjective:   Presents for evaluation of cough and sore throat.  She reports feeling bad now for several months-tired, generalized malaise.  Several weeks ago, she developed a productive cough. Sputum is yellow or clear. The throat isn't really painful, but she loses her voice in the evenings. No fever or chills. No nausea, vomiting or diarrhea. No nasal or sinus congestion. No post-nasal drainage. No CP or SOB.  Has cut back on smoking more, down to just a couple each day. Called for a refill of tessalon perles, but hasn't picked them up yet.   Review of Systems As above.    Patient Active Problem List   Diagnosis Date Noted  . Urinary frequency 02/20/2016  . Right knee DJD 12/09/2015  . Hx of adenomatous colonic polyps 10/19/2015  . Osteopenia 08/15/2015  . Femoral neck fracture ([place]) 07/16/2015  . Chronic pain syndrome 07/16/2015  . Primary localized osteoarthrosis of pelvic region 07/16/2015  . Anxiety state 07/16/2015  . Palpitations 07/16/2015  . Vitamin D deficiency 05/16/2015  . Aortic stenosis with mitral and aortic insufficiency 07/29/2014  . Hypertension 07/29/2014  . Hyperlipidemia 07/29/2014     Prior to Admission medications   Medication Sig Start Date End Date Taking? Authorizing Provider  atorvastatin (LIPITOR) 20 MG tablet take 1 tablet by mouth every evening 01/16/16  Yes Cerrone Debold, PA-C  Azelastine HCl 0.15 % SOLN Place 2 sprays into both nostrils 2 (two) times daily. 03/26/16  Yes Genavive Kubicki, PA-C  benzonatate (TESSALON) 100 MG capsule take 1-2 capsules by mouth three times a day if needed for cough 04/17/16  Yes Dorian Heckle English, PA  bisoprolol-hydrochlorothiazide [facility]) 10-6.25 MG tablet take 1 tablet by mouth once daily 04/22/16  Yes Ahmaud Duthie, PA-C   losartan (COZAAR) 50 MG tablet take 1 tablet by mouth once daily 04/22/16  Yes Maeli Spacek, PA-C  oxybutynin (DITROPAN-XL) 10 MG 24 hr tablet Take 1 tablet (10 mg total) by mouth at bedtime. 02/20/16  Yes Midge Momon, PA-C  oxyCODONE-acetaminophen (PERCOCET) 10-325 MG tablet  02/05/16  Yes Historical Provider, MD  ranitidine (ZANTAC) 150 MG tablet Take 1 tablet (150 mg total) by mouth 2 (two) times daily. 03/26/16  Yes Elmar Antigua, PA-C  Vitamin D, Cholecalciferol, 1000 units CAPS Take 1,000 Units by mouth daily. Reported on 09/28/2015   Yes Historical Provider, MD  ALPRAZolam Duanne Moron) 1 MG tablet Take 1 mg by mouth 3 (three) times daily. Reported on 09/28/2015 07/11/14   Historical Provider, MD  Zoster Vaccine Live, PF, (ZOSTAVAX) 60454 UNT/0.65ML injection Inject 19,400 Units into the skin once. Patient not taking: Reported on 05/21/2016 08/15/15   Harrison Mons, PA-C     Allergies  Allergen Reactions  . Codeine Nausea Only       Objective:  Physical Exam  Constitutional: She is oriented to person, place, and time. She appears well-developed and well-nourished. She is active and cooperative. No distress.  BP (!) 158/74 (BP Location: Right Arm, Patient Position: Sitting, Cuff Size: Small)   Pulse 74   Temp 98.7 F (37.1 C) (Oral)   Resp 18   Ht 5' 5.5" (1.664 m)   Wt 153 lb (69.4 kg)   SpO2 96%   BMI 25.07 kg/m   HENT:  Head: Normocephalic  and atraumatic.  Right Ear: Hearing, tympanic membrane, external ear and ear canal normal.  Left Ear: Hearing, tympanic membrane, external ear and ear canal normal.  Nose: Mucosal edema present. No rhinorrhea or nose lacerations. Right sinus exhibits no maxillary sinus tenderness and no frontal sinus tenderness. Left sinus exhibits no maxillary sinus tenderness and no frontal sinus tenderness.  Mouth/Throat: Oropharynx is clear and moist and mucous membranes are normal.  Eyes: Conjunctivae are normal. No scleral icterus.  Neck: Normal range  of motion. Neck supple. No thyromegaly present.  Cardiovascular: Normal rate, regular rhythm and normal heart sounds.   Pulses:      Radial pulses are 2+ on the right side, and 2+ on the left side.  Pulmonary/Chest: Effort normal. She has no decreased breath sounds. She has wheezes (scattered, soft wheezes). She has no rhonchi. She has no rales.  Lymphadenopathy:       Head (right side): No tonsillar, no preauricular, no posterior auricular and no occipital adenopathy present.       Head (left side): No tonsillar, no preauricular, no posterior auricular and no occipital adenopathy present.    She has no cervical adenopathy.       Right: No supraclavicular adenopathy present.       Left: No supraclavicular adenopathy present.  Neurological: She is alert and oriented to person, place, and time. No sensory deficit.  Skin: Skin is warm, dry and intact. No rash noted. No cyanosis or erythema. Nails show no clubbing.  Psychiatric: She has a normal mood and affect. Her speech is normal and behavior is normal.           Assessment & Plan:   1. Cough 2. Laryngitis Await radiographs and treat as indicated.  - DG Chest 2 View; Future   3. Essential hypertension Elevated today, likely due to illness. Will recheck when well.   Dg Chest 2 View  Result Date: 05/21/2016 CLINICAL DATA:  Several month history of fatigue. Several weeks of cough. Current smoker. History of hypertension and hyperlipidemia. EXAM: CHEST  2 VIEW COMPARISON:  PA and lateral chest x-ray of August 24, 2010. FINDINGS: The lungs are hyperinflated with hemidiaphragm flattening. There is no focal infiltrate. The lung markings are coarse just above the right lateral costophrenic angle. There is no pleural effusion. The interstitial markings are coarse though stable. The heart and pulmonary vascularity are normal. The mediastinum is normal in width. There is calcification in the wall of the aortic arch. The trachea is midline. The  bony thorax exhibits no acute abnormality. There is old deformity of the lateral aspect of the right ninth rib. IMPRESSION: COPD. Coarse lung markings in the inferolateral aspect of the right middle lobe likely reflects atelectasis though pneumonia is not excluded. Followup PA and lateral chest X-ray is recommended in 3-4 weeks following trial of antibiotic therapy to ensure resolution and exclude underlying malignancy. Thoracic aortic atherosclerosis. Electronically Signed   By: David  Martinique M.D.   On: 05/21/2016 11:10   Contacted patient with report. Treat with azithromycin and repeat CXR in 4 weeks.   Fara Chute, PA-C Physician Assistant-Certified Primary Care at [place]

## 2016-05-22 ENCOUNTER — Other Ambulatory Visit: Payer: Self-pay | Admitting: Physician Assistant

## 2016-05-22 DIAGNOSIS — R059 Cough, unspecified: Secondary | ICD-10-CM

## 2016-05-22 DIAGNOSIS — J449 Chronic obstructive pulmonary disease, unspecified: Secondary | ICD-10-CM | POA: Insufficient documentation

## 2016-05-22 DIAGNOSIS — R05 Cough: Secondary | ICD-10-CM

## 2016-05-22 HISTORY — DX: Chronic obstructive pulmonary disease, unspecified: J44.9

## 2016-05-29 DIAGNOSIS — G894 Chronic pain syndrome: Secondary | ICD-10-CM | POA: Diagnosis not present

## 2016-05-29 DIAGNOSIS — M79606 Pain in leg, unspecified: Secondary | ICD-10-CM | POA: Diagnosis not present

## 2016-05-29 DIAGNOSIS — M25569 Pain in unspecified knee: Secondary | ICD-10-CM | POA: Diagnosis not present

## 2016-05-29 DIAGNOSIS — Z79891 Long term (current) use of opiate analgesic: Secondary | ICD-10-CM | POA: Diagnosis not present

## 2016-05-29 DIAGNOSIS — Z79899 Other long term (current) drug therapy: Secondary | ICD-10-CM | POA: Diagnosis not present

## 2016-05-29 DIAGNOSIS — M1990 Unspecified osteoarthritis, unspecified site: Secondary | ICD-10-CM | POA: Diagnosis not present

## 2016-06-05 DIAGNOSIS — M179 Osteoarthritis of knee, unspecified: Secondary | ICD-10-CM | POA: Diagnosis not present

## 2016-06-25 ENCOUNTER — Telehealth: Payer: Self-pay | Admitting: Physician Assistant

## 2016-06-25 ENCOUNTER — Encounter: Payer: Self-pay | Admitting: Physician Assistant

## 2016-06-25 ENCOUNTER — Ambulatory Visit (INDEPENDENT_AMBULATORY_CARE_PROVIDER_SITE_OTHER): Payer: Medicare Other

## 2016-06-25 ENCOUNTER — Ambulatory Visit (INDEPENDENT_AMBULATORY_CARE_PROVIDER_SITE_OTHER): Payer: Medicare Other | Admitting: Physician Assistant

## 2016-06-25 VITALS — BP 122/72 | HR 72 | Temp 97.6°F | Resp 16 | Ht 65.5 in | Wt 151.4 lb

## 2016-06-25 DIAGNOSIS — F17200 Nicotine dependence, unspecified, uncomplicated: Secondary | ICD-10-CM | POA: Insufficient documentation

## 2016-06-25 DIAGNOSIS — J449 Chronic obstructive pulmonary disease, unspecified: Secondary | ICD-10-CM

## 2016-06-25 DIAGNOSIS — R9389 Abnormal findings on diagnostic imaging of other specified body structures: Secondary | ICD-10-CM

## 2016-06-25 NOTE — Telephone Encounter (Signed)
Spoke with patient. Advised of results of CXR. CT ordered.

## 2016-06-25 NOTE — Patient Instructions (Signed)
     IF you received an x-ray today, you will receive an invoice from [facility]. Please contact [facility] at [phone] with questions or concerns regarding your invoice.   IF you received labwork today, you will receive an invoice from [facility]. Please contact [facility] at [phone] with questions or concerns regarding your invoice.   Our billing staff will not be able to assist you with questions regarding bills from these companies.  You will be contacted with the lab results as soon as they are available. The fastest way to get your results is to activate your My Chart account. Instructions are located on the last page of this paperwork. If you have not heard from us regarding the results in 2 weeks, please contact this office.     

## 2016-06-25 NOTE — Progress Notes (Signed)
THIS NOTE IS USED FOR EDUCATIONAL PURPOSES ONLY!!!   Name: Alexandra [name]  DOB: [birth year]  Age: 66 y.o. Sex: female  CC: F/u CXR  PCP: Harrison Mons, PA-C  HPI: Patient reports feeling much better today. She reports she received a medication for opiod-induced constipation (Symproic) x3 weeks ago and had x1 week of nausea and vomiting. She reports that she has not vomited in over 2 weeks. She denies cough, fever, chills, SOB, CP. She continues to smoke 1-2 cigarettes daily without any thought of quitting today.   ROS:  Constitutional: Negative for activity change, appetite change, fatigue and unexpected weight change.  HENT: Negative for congestion, dental problem, ear pain, hearing loss, mouth sores, postnasal drip, rhinorrhea, sneezing, sore throat, tinnitus and trouble swallowing.   Eyes: Negative for photophobia, pain, redness and visual disturbance.  Respiratory: Negative for cough, chest tightness and shortness of breath.   Cardiovascular: Negative for chest pain, palpitations and leg swelling.  Gastrointestinal: Negative for abdominal pain, blood in stool, constipation, diarrhea, nausea and vomiting.  Genitourinary: Negative for dysuria, frequency, hematuria and urgency.  Musculoskeletal: Negative for arthralgias, gait problem, myalgias and neck stiffness.  Skin: Negative for rash.  Neurological: Negative for dizziness, speech difficulty, weakness, light-headedness, numbness and headaches.  Hematological: Negative for adenopathy.  Psychiatric/Behavioral: Negative for confusion and sleep disturbance. The patient is not nervous/anxious.    PMH:  Patient Active Problem List   Diagnosis Date Noted  . COPD (chronic obstructive pulmonary disease) ([place]) 05/22/2016  . Urinary frequency 02/20/2016  . Right knee DJD 12/09/2015  . Hx of adenomatous colonic polyps 10/19/2015  . Osteopenia 08/15/2015  . Femoral neck fracture ([place]) 07/16/2015  . Chronic pain syndrome 07/16/2015  . Primary  localized osteoarthrosis of pelvic region 07/16/2015  . Anxiety state 07/16/2015  . Palpitations 07/16/2015  . Vitamin D deficiency 05/16/2015  . Aortic stenosis with mitral and aortic insufficiency 07/29/2014  . Hypertension 07/29/2014  . Hyperlipidemia 07/29/2014    Allergies:  Allergies  Allergen Reactions  . Codeine Nausea Only    Medications:  Current Outpatient Prescriptions on File Prior to Visit  Medication Sig Dispense Refill  . ALPRAZolam (XANAX) 1 MG tablet Take 1 mg by mouth 3 (three) times daily. Reported on 09/28/2015  0  . atorvastatin (LIPITOR) 20 MG tablet take 1 tablet by mouth every evening 30 tablet 3  . Azelastine HCl 0.15 % SOLN Place 2 sprays into both nostrils 2 (two) times daily. 30 mL 0  . azithromycin (ZITHROMAX) 250 MG tablet Take 2 tabs PO x 1 dose, then 1 tab PO QD x 4 days 6 tablet 0  . benzonatate (TESSALON) 100 MG capsule take 1-2 capsules by mouth three times a day if needed for cough 40 capsule 0  . bisoprolol-hydrochlorothiazide (ZIAC) 10-6.25 MG tablet take 1 tablet by mouth once daily 90 tablet 1  . losartan (COZAAR) 50 MG tablet take 1 tablet by mouth once daily 90 tablet 1  . oxybutynin (DITROPAN-XL) 10 MG 24 hr tablet Take 1 tablet (10 mg total) by mouth at bedtime. 90 tablet 3  . oxyCODONE-acetaminophen (PERCOCET) 10-325 MG tablet     . ranitidine (ZANTAC) 150 MG tablet Take 1 tablet (150 mg total) by mouth 2 (two) times daily. 60 tablet 0  . Vitamin D, Cholecalciferol, 1000 units CAPS Take 1,000 Units by mouth daily. Reported on 09/28/2015    . Zoster Vaccine Live, PF, (ZOSTAVAX) 14431 UNT/0.65ML injection Inject 19,400 Units into the skin once. (Patient not taking:  Reported on 05/21/2016) 1 each 0   No current facility-administered medications on file prior to visit.     PE:  GS: WDWN female sitting on exam table in NAD.  Vitals: BP 122/72 (BP Location: Left Arm, Patient Position: Sitting, Cuff Size: Small)   Pulse 72   Temp 97.6 F (36.4  C) (Oral)   Resp 16   Ht 5' 5.5" (1.664 m)   Wt 151 lb 6.4 oz (68.7 kg)   SpO2 96%   BMI 24.81 kg/m   HEENT: Normocephalic, atruamatic. PEARRL.   Cardiovascular: 3/6 systolic ejection murmur radiating to the carotids.  No S3 or S4. No rubs, or gallops. Pulses 2+ and equal bilateral in the upper and lower extremities. No pitting edema. No varicosities, clubbing, or cyanosis.  Pulm: CTA bilaterally. No expiratory muscle use while breathing.  Neuro: CN 2-12 grossly intact.  Psych: A&O x 4. Mood and affect appropriate for situation.  Skin: Warm and dry. No rashes or excoriations on exposed skin.   A&P:  Chronic obstructive pulmonary disease, unspecified COPD type ([place]) - Much improved Plan:  - Imaging: DG Chest 2 View today Monitor for fever, or worsening symptoms.   Smoker: continued Plan:  Education: Patient has no interest in smoking cessation today. Counseling given about benefits of d/c smoking.   F/u in May for chronic issues.        Respectfully,  Delilah Shan, PA-S2

## 2016-06-25 NOTE — Progress Notes (Signed)
Patient ID: Alexandra [name], female    DOB: 11/18/1950, 66 y.o.   MRN: [id number]  PCP: Harrison Mons, PA-C  Chief Complaint  Patient presents with  . Follow-up    chest Xray from pneumonia    Subjective:   Presents for 4 week follow-up CXR.  I saw her 05/21/2016 with cough x several weeks and feeling bad x several months. CXR performed at that visit: COPD. Coarse lung markings in the inferolateral aspect of the right middle lobe likely reflects atelectasis though pneumonia is not excluded. Followup PA and lateral chest X-ray is recommended in 3-4 weeks following trial of antibiotic therapy to ensure resolution and exclude underlying malignancy. Thoracic aortic atherosclerosis.  She completed a course of azithromycin and reports that she feels well. She continues to smoke, without readiness to quit.  She was recently tried on a medication for opiate-induced constipation, but did not tolerate it at all.    Review of Systems Cough, sore throat, fatigue/malaise now resolved.    Patient Active Problem List   Diagnosis Date Noted  . COPD (chronic obstructive pulmonary disease) ([place]) 05/22/2016  . Urinary frequency 02/20/2016  . Right knee DJD 12/09/2015  . Hx of adenomatous colonic polyps 10/19/2015  . Osteopenia 08/15/2015  . Femoral neck fracture ([place]) 07/16/2015  . Chronic pain syndrome 07/16/2015  . Primary localized osteoarthrosis of pelvic region 07/16/2015  . Anxiety state 07/16/2015  . Palpitations 07/16/2015  . Vitamin D deficiency 05/16/2015  . Aortic stenosis with mitral and aortic insufficiency 07/29/2014  . Hypertension 07/29/2014  . Hyperlipidemia 07/29/2014     Prior to Admission medications   Medication Sig Start Date End Date Taking? Authorizing Provider  atorvastatin (LIPITOR) 20 MG tablet take 1 tablet by mouth every evening 01/16/16  Yes Balthazar Dooly, PA-C  Azelastine HCl 0.15 % SOLN Place 2 sprays into both nostrils 2 (two) times daily. 03/26/16   Yes Cortni Tays, PA-C  bisoprolol-hydrochlorothiazide [facility]) 10-6.25 MG tablet take 1 tablet by mouth once daily 04/22/16  Yes Apryle Stowell, PA-C  losartan (COZAAR) 50 MG tablet take 1 tablet by mouth once daily 04/22/16  Yes Faaris Arizpe, PA-C  oxybutynin (DITROPAN-XL) 10 MG 24 hr tablet Take 1 tablet (10 mg total) by mouth at bedtime. 02/20/16  Yes Nautia Lem, PA-C  oxyCODONE (OXYCONTIN) 10 mg 12 hr tablet Take 10 mg by mouth every 12 (twelve) hours.   Yes Historical Provider, MD  oxyCODONE-acetaminophen (PERCOCET) 10-325 MG tablet  02/05/16  Yes Historical Provider, MD  ranitidine (ZANTAC) 150 MG tablet Take 1 tablet (150 mg total) by mouth 2 (two) times daily. 03/26/16  Yes Owain Eckerman, PA-C  Vitamin D, Cholecalciferol, 1000 units CAPS Take 1,000 Units by mouth daily. Reported on 09/28/2015   Yes Historical Provider, MD  Zoster Vaccine Live, PF, (ZOSTAVAX) 03500 UNT/0.65ML injection Inject 19,400 Units into the skin once. Patient not taking: Reported on 06/25/2016 08/15/15   Harrison Mons, PA-C     Allergies  Allergen Reactions  . Codeine Nausea Only       Objective:  Physical Exam  Constitutional: She is oriented to person, place, and time. She appears well-developed and well-nourished. She is active and cooperative. No distress.  BP 122/72 (BP Location: Left Arm, Patient Position: Sitting, Cuff Size: Small)   Pulse 72   Temp 97.6 F (36.4 C) (Oral)   Resp 16   Ht 5' 5.5" (1.664 m)   Wt 151 lb 6.4 oz (68.7 kg)   SpO2 96%  BMI 24.81 kg/m   HENT:  Head: Normocephalic and atraumatic.  Right Ear: Hearing normal.  Left Ear: Hearing normal.  Eyes: Conjunctivae are normal. No scleral icterus.  Neck: Normal range of motion. Neck supple. No thyromegaly present.  Cardiovascular: Normal rate and regular rhythm.   Murmur (consistent with aortic stenosis) heard. Pulses:      Radial pulses are 2+ on the right side, and 2+ on the left side.  Pulmonary/Chest: Effort normal and  breath sounds normal.  Lymphadenopathy:       Head (right side): No tonsillar, no preauricular, no posterior auricular and no occipital adenopathy present.       Head (left side): No tonsillar, no preauricular, no posterior auricular and no occipital adenopathy present.    She has no cervical adenopathy.       Right: No supraclavicular adenopathy present.       Left: No supraclavicular adenopathy present.  Neurological: She is alert and oriented to person, place, and time. No sensory deficit.  Skin: Skin is warm, dry and intact. No rash noted. No cyanosis or erythema. Nails show no clubbing.  Psychiatric: She has a normal mood and affect. Her speech is normal and behavior is normal.           Assessment & Plan:   Problem List Items Addressed This Visit    COPD (chronic obstructive pulmonary disease) ([place]) - Primary   Relevant Orders   DG Chest 2 View   Smoker     Clinically well. Await CXR. Encouraged smoking cessation. F/U 07/30/2016 as planned, sooner if needed.     Fara Chute, PA-C Primary [facility] at [place]

## 2016-06-26 DIAGNOSIS — M1990 Unspecified osteoarthritis, unspecified site: Secondary | ICD-10-CM | POA: Diagnosis not present

## 2016-06-26 DIAGNOSIS — G894 Chronic pain syndrome: Secondary | ICD-10-CM | POA: Diagnosis not present

## 2016-06-26 DIAGNOSIS — M79606 Pain in leg, unspecified: Secondary | ICD-10-CM | POA: Diagnosis not present

## 2016-06-26 DIAGNOSIS — Z79891 Long term (current) use of opiate analgesic: Secondary | ICD-10-CM | POA: Diagnosis not present

## 2016-06-26 DIAGNOSIS — M25569 Pain in unspecified knee: Secondary | ICD-10-CM | POA: Diagnosis not present

## 2016-06-26 DIAGNOSIS — Z79899 Other long term (current) drug therapy: Secondary | ICD-10-CM | POA: Diagnosis not present

## 2016-06-27 ENCOUNTER — Other Ambulatory Visit: Payer: Self-pay | Admitting: Physician Assistant

## 2016-06-28 ENCOUNTER — Ambulatory Visit
Admission: RE | Admit: 2016-06-28 | Discharge: 2016-06-28 | Disposition: A | Discharge status: Home / Self Care | Payer: Medicare Other | Source: Ambulatory Visit | Attending: Physician Assistant | Admitting: Physician Assistant

## 2016-06-28 DIAGNOSIS — R918 Other nonspecific abnormal finding of lung field: Secondary | ICD-10-CM | POA: Diagnosis not present

## 2016-06-28 DIAGNOSIS — R9389 Abnormal findings on diagnostic imaging of other specified body structures: Secondary | ICD-10-CM

## 2016-06-28 MED ORDER — IOPAMIDOL (ISOVUE-300) INJECTION 61%
75.0000 mL | Freq: Once | INTRAVENOUS | Status: AC | PRN
  Administered 2016-06-28: 75 mL via INTRAVENOUS

## 2016-07-05 ENCOUNTER — Telehealth: Payer: Self-pay | Admitting: Physician Assistant

## 2016-07-05 DIAGNOSIS — R911 Solitary pulmonary nodule: Secondary | ICD-10-CM | POA: Insufficient documentation

## 2016-07-05 NOTE — Telephone Encounter (Signed)
Reviewed results and recommendations. She wants to repeat CT scan in 3 months, rather than proceed with thoracic surgery referral for potential biopsy.  Still coughing a little, but feeling better. Has follow-up with me next month.

## 2016-07-24 DIAGNOSIS — M1990 Unspecified osteoarthritis, unspecified site: Secondary | ICD-10-CM | POA: Diagnosis not present

## 2016-07-24 DIAGNOSIS — Z79899 Other long term (current) drug therapy: Secondary | ICD-10-CM | POA: Diagnosis not present

## 2016-07-24 DIAGNOSIS — M25569 Pain in unspecified knee: Secondary | ICD-10-CM | POA: Diagnosis not present

## 2016-07-24 DIAGNOSIS — G894 Chronic pain syndrome: Secondary | ICD-10-CM | POA: Diagnosis not present

## 2016-07-24 DIAGNOSIS — M79606 Pain in leg, unspecified: Secondary | ICD-10-CM | POA: Diagnosis not present

## 2016-07-24 DIAGNOSIS — Z79891 Long term (current) use of opiate analgesic: Secondary | ICD-10-CM | POA: Diagnosis not present

## 2016-07-30 ENCOUNTER — Encounter: Payer: Self-pay | Admitting: Physician Assistant

## 2016-07-30 ENCOUNTER — Ambulatory Visit (INDEPENDENT_AMBULATORY_CARE_PROVIDER_SITE_OTHER): Payer: Medicare Other | Admitting: Physician Assistant

## 2016-07-30 VITALS — BP 130/82 | HR 72 | Temp 98.8°F | Resp 16 | Ht 65.0 in | Wt 153.4 lb

## 2016-07-30 DIAGNOSIS — E559 Vitamin D deficiency, unspecified: Secondary | ICD-10-CM | POA: Diagnosis not present

## 2016-07-30 DIAGNOSIS — R911 Solitary pulmonary nodule: Secondary | ICD-10-CM

## 2016-07-30 DIAGNOSIS — Z23 Encounter for immunization: Secondary | ICD-10-CM | POA: Diagnosis not present

## 2016-07-30 DIAGNOSIS — J449 Chronic obstructive pulmonary disease, unspecified: Secondary | ICD-10-CM | POA: Diagnosis not present

## 2016-07-30 DIAGNOSIS — S72009D Fracture of unspecified part of neck of unspecified femur, subsequent encounter for closed fracture with routine healing: Secondary | ICD-10-CM

## 2016-07-30 DIAGNOSIS — F17200 Nicotine dependence, unspecified, uncomplicated: Secondary | ICD-10-CM | POA: Diagnosis not present

## 2016-07-30 DIAGNOSIS — I10 Essential (primary) hypertension: Secondary | ICD-10-CM | POA: Diagnosis not present

## 2016-07-30 DIAGNOSIS — E785 Hyperlipidemia, unspecified: Secondary | ICD-10-CM

## 2016-07-30 DIAGNOSIS — F411 Generalized anxiety disorder: Secondary | ICD-10-CM

## 2016-07-30 MED ORDER — ZOSTER VAC RECOMB ADJUVANTED 50 MCG/0.5ML IM SUSR: 0.5000 mL | Freq: Once | INTRAMUSCULAR | 1 refills | Status: AC

## 2016-07-30 MED ORDER — FLUTICASONE PROPIONATE HFA 220 MCG/ACT IN AERO: 2.0000 | INHALATION_SPRAY | Freq: Two times a day (BID) | RESPIRATORY_TRACT | 12 refills | Status: DC

## 2016-07-30 NOTE — Assessment & Plan Note (Addendum)
Severe obstruction. Trying to quit smoking. Add steroid inhaler.

## 2016-07-30 NOTE — Patient Instructions (Addendum)
Please schedule your mammogram and bone density tests. The orders are in the system. If you call now, you may be able to schedule them on the same day as the Chest CT on 10/11/2016.    IF you received an x-ray today, you will receive an invoice from [facility]. Please contact [facility] at [phone] with questions or concerns regarding your invoice.   IF you received labwork today, you will receive an invoice from [place]. Please contact [facility] at [phone] with questions or concerns regarding your invoice.   Our billing staff will not be able to assist you with questions regarding bills from these companies.  You will be contacted with the lab results as soon as they are available. The fastest way to get your results is to activate your My Chart account. Instructions are located on the last page of this paperwork. If you have not heard from [place] regarding the results in 2 weeks, please contact this office.

## 2016-07-30 NOTE — Assessment & Plan Note (Signed)
Follow-up with orthopedics and pain management per their instructions.

## 2016-07-30 NOTE — Progress Notes (Signed)
Patient ID: Alexandra [name], female    DOB: Dec 15, 1950, 66 y.o.   MRN: [id number]  PCP: Harrison Mons, PA-C  Chief Complaint  Patient presents with  . Follow-up    4 mos COPD, per pt "I have been feeling just fine"    Subjective:   Presents for evaluation of COPD.  From a respiratory standpoint, she reports that she feels pretty good. Not more SOB or fatigued.   However, she has been thinking more about the RIGHT lung nodule, and is feeling more nervous. Recall that this was noted on follow-up CXR after treatment for CAP, and CT revealed an irregular nodular density at the RIGHT lung base, 9 mm, possibly correlating to the finding on CXR. Initially, she elected 3 month follow-up imaging. She thinks that rather than wait for the follow-up chest CT, scheduled for 10/11/2016, she'd like to see the thoracic surgeon to know what options exist for biopsy of the lesion.   Review of Systems As above.    Patient Active Problem List   Diagnosis Date Noted  . Nodule of right lung 07/05/2016  . Smoker 06/25/2016  . COPD (chronic obstructive pulmonary disease) ([place]) 05/22/2016  . Urinary frequency 02/20/2016  . Right knee DJD 12/09/2015  . Hx of adenomatous colonic polyps 10/19/2015  . Osteopenia 08/15/2015  . Femoral neck fracture ([place]) 07/16/2015  . Chronic pain syndrome 07/16/2015  . Primary localized osteoarthrosis of pelvic region 07/16/2015  . Anxiety state 07/16/2015  . Palpitations 07/16/2015  . Vitamin D deficiency 05/16/2015  . Aortic stenosis with mitral and aortic insufficiency 07/29/2014  . Hypertension 07/29/2014  . Hyperlipidemia 07/29/2014     Prior to Admission medications   Medication Sig Start Date End Date Taking? Authorizing Provider  atorvastatin (LIPITOR) 20 MG tablet take 1 tablet by mouth every evening 06/28/16  Yes Chinelo Benn, PA-C  Azelastine HCl 0.15 % SOLN Place 2 sprays into both nostrils 2 (two) times daily. 03/26/16  Yes Harrison Mons, PA-C    bisoprolol-hydrochlorothiazide [facility]) 10-6.25 MG tablet take 1 tablet by mouth once daily 04/22/16  Yes Malary Aylesworth, PA-C  losartan (COZAAR) 50 MG tablet take 1 tablet by mouth once daily 04/22/16  Yes Xitlali Kastens, PA-C  oxybutynin (DITROPAN-XL) 10 MG 24 hr tablet Take 1 tablet (10 mg total) by mouth at bedtime. 02/20/16  Yes Bentleigh Waren, PA-C  oxyCODONE (OXYCONTIN) 10 mg 12 hr tablet Take 10 mg by mouth every 12 (twelve) hours.   Yes [provider]  oxyCODONE-acetaminophen (PERCOCET) 10-325 MG tablet  02/05/16  Yes [provider]  Vitamin D, Cholecalciferol, 1000 units CAPS Take 1,000 Units by mouth daily. Reported on 09/28/2015   Yes [provider]  ranitidine (ZANTAC) 150 MG tablet Take 1 tablet (150 mg total) by mouth 2 (two) times daily. Patient not taking: Reported on 07/30/2016 03/26/16   Harrison Mons, PA-C     Allergies  Allergen Reactions  . Codeine Nausea Only  . Symproic [Naldemedine] Nausea And Vomiting       Objective:  Physical Exam  Constitutional: She is oriented to person, place, and time. She appears well-developed and well-nourished. She is active and cooperative. No distress.  BP 130/82 (BP Location: Left Arm, Cuff Size: Normal)   Pulse 72   Temp 98.8 F (37.1 C) (Oral)   Resp 16   Ht 5\' 5"  (1.651 m)   Wt 153 lb 6.4 oz (69.6 kg)   SpO2 96%   BMI 25.53 kg/m  HENT:  Head: Normocephalic and atraumatic.  Right Ear: Hearing normal.  Left Ear: Hearing normal.  Eyes: Conjunctivae are normal. No scleral icterus.  Neck: Normal range of motion. Neck supple. No thyromegaly present.  Cardiovascular: Normal rate, regular rhythm and normal heart sounds.   Pulses:      Radial pulses are 2+ on the right side, and 2+ on the left side.  Pulmonary/Chest: Effort normal and breath sounds normal.  Lymphadenopathy:       Head (right side): No tonsillar, no preauricular, no posterior auricular and no occipital adenopathy present.        Head (left side): No tonsillar, no preauricular, no posterior auricular and no occipital adenopathy present.    She has no cervical adenopathy.       Right: No supraclavicular adenopathy present.       Left: No supraclavicular adenopathy present.  Neurological: She is alert and oriented to person, place, and time. No sensory deficit.  Skin: Skin is warm, dry and intact. No rash noted. No cyanosis or erythema. Nails show no clubbing.  Psychiatric: Her speech is normal and behavior is normal. Her mood appears anxious. Her affect is not angry, not blunt, not labile and not inappropriate. She does not exhibit a depressed mood.    Office Spirometry Results: only one acceptable maneuver, despite multiple attempts. Severe obstruction. FEV1: 0.83 liters FVC: 2.46 liters FEV1/FVC: 33.7 % FVC  % Predicted: 76 % FEV % Predicted: 44 % FeF 25-75: 0.82 liters FeF 25-75 % Predicted: 39     Assessment & Plan:   Problem List Items Addressed This Visit    Hypertension    Controlled.      Relevant Orders   CBC with Differential/Platelet (Completed)   Comprehensive metabolic panel (Completed)   Hyperlipidemia    Await labs. Adjust regimen as indicated by results.       Relevant Orders   Comprehensive metabolic panel (Completed)   Lipid panel (Completed)   Vitamin D deficiency    Await labs. Adjust regimen as indicated by results.       Relevant Orders   VITAMIN D 25 Hydroxy (Vit-D Deficiency, Fractures) (Completed)   Femoral neck fracture ([place])    Follow-up with orthopedics and pain management per their instructions.      Anxiety state    Exacerbated by worry for malignancy. Proceed with thoracic surgery evaluation.      COPD (chronic obstructive pulmonary disease) (HCC) - Primary    Severe obstruction. Trying to quit smoking. Add steroid inhaler.      Relevant Medications   fluticasone (FLOVENT HFA) 220 MCG/ACT inhaler   Other Relevant Orders   PFT PULM FXN SPIROMETRY (94010)     Smoker    Encouraged smoking cessation.      Nodule of right lung    Rather than repeat CT scan in 3 months, scheduled for 10/11/2016, patient elects to consider options for tissue sampling.       Relevant Orders   Ambulatory referral to Cardiothoracic Surgery    Other Visit Diagnoses    Need for zoster vaccination           Return in about 6 weeks (around 09/10/2016) for re-evaluation of breathing with new inhaler.   Fara Chute, PA-C [facility] [place]

## 2016-07-31 ENCOUNTER — Encounter: Payer: Self-pay | Admitting: Physician Assistant

## 2016-07-31 LAB — COMPREHENSIVE METABOLIC PANEL
A/G RATIO: 2.5 — AB (ref 1.2–2.2)
ALK PHOS: 93 IU/L (ref 39–117)
ALT: 9 IU/L (ref 0–32)
AST: 15 IU/L (ref 0–40)
Albumin: 4.7 g/dL (ref 3.6–4.8)
BUN/Creatinine Ratio: 14 (ref 12–28)
BUN: 11 mg/dL (ref 8–27)
Bilirubin Total: 0.3 mg/dL (ref 0.0–1.2)
CALCIUM: 9.5 mg/dL (ref 8.7–10.3)
CO2: 25 mmol/L (ref 18–29)
Chloride: 100 mmol/L (ref 96–106)
Creatinine, Ser: 0.78 mg/dL (ref 0.57–1.00)
GFR calc Af Amer: 91 mL/min/{1.73_m2} (ref >59)
GFR, EST NON AFRICAN AMERICAN: 79 mL/min/{1.73_m2} (ref >59)
GLOBULIN, TOTAL: 1.9 g/dL (ref 1.5–4.5)
Glucose: 93 mg/dL (ref 65–99)
POTASSIUM: 4.3 mmol/L (ref 3.5–5.2)
SODIUM: 140 mmol/L (ref 134–144)
Total Protein: 6.6 g/dL (ref 6.0–8.5)

## 2016-07-31 LAB — CBC WITH DIFFERENTIAL/PLATELET
Basophils Absolute: 0.1 10*3/uL (ref 0.0–0.2)
Basos: 1 %
EOS (ABSOLUTE): 0.5 10*3/uL — ABNORMAL HIGH (ref 0.0–0.4)
Eos: 4 %
Hematocrit: 40.4 % (ref 34.0–46.6)
Hemoglobin: 12.5 g/dL (ref 11.1–15.9)
Immature Grans (Abs): 0 10*3/uL (ref 0.0–0.1)
Immature Granulocytes: 0 %
Lymphocytes Absolute: 2.4 10*3/uL (ref 0.7–3.1)
Lymphs: 20 %
MCH: 28.7 pg (ref 26.6–33.0)
MCHC: 30.9 g/dL — ABNORMAL LOW (ref 31.5–35.7)
MCV: 93 fL (ref 79–97)
Monocytes Absolute: 1 10*3/uL — ABNORMAL HIGH (ref 0.1–0.9)
Monocytes: 8 %
Neutrophils Absolute: 8.2 10*3/uL — ABNORMAL HIGH (ref 1.4–7.0)
Neutrophils: 67 %
Platelets: 253 10*3/uL (ref 150–379)
RBC: 4.35 x10E6/uL (ref 3.77–5.28)
RDW: 14.8 % (ref 12.3–15.4)
WBC: 12.3 10*3/uL — ABNORMAL HIGH (ref 3.4–10.8)

## 2016-07-31 LAB — LIPID PANEL
CHOLESTEROL TOTAL: 137 mg/dL (ref 100–199)
Chol/HDL Ratio: 3.1 ratio (ref 0.0–4.4)
HDL: 44 mg/dL (ref >39)
LDL Calculated: 75 mg/dL (ref 0–99)
TRIGLYCERIDES: 89 mg/dL (ref 0–149)
VLDL Cholesterol Cal: 18 mg/dL (ref 5–40)

## 2016-07-31 LAB — VITAMIN D 25 HYDROXY (VIT D DEFICIENCY, FRACTURES): Vit D, 25-Hydroxy: 32.9 ng/mL (ref 30.0–100.0)

## 2016-07-31 NOTE — Assessment & Plan Note (Signed)
Controlled.  

## 2016-07-31 NOTE — Assessment & Plan Note (Signed)
Exacerbated by worry for malignancy. Proceed with thoracic surgery evaluation.

## 2016-07-31 NOTE — Assessment & Plan Note (Signed)
Await labs. Adjust regimen as indicated by results.  

## 2016-07-31 NOTE — Assessment & Plan Note (Signed)
Rather than repeat CT scan in 3 months, scheduled for 10/11/2016, patient elects to consider options for tissue sampling.

## 2016-07-31 NOTE — Assessment & Plan Note (Signed)
Encouraged smoking cessation 

## 2016-08-02 ENCOUNTER — Telehealth: Payer: Self-pay | Admitting: *Deleted

## 2016-08-02 NOTE — Telephone Encounter (Signed)
Patient notified by letter

## 2016-08-16 ENCOUNTER — Ambulatory Visit: Payer: Medicare Other

## 2016-08-21 ENCOUNTER — Institutional Professional Consult (permissible substitution) (INDEPENDENT_AMBULATORY_CARE_PROVIDER_SITE_OTHER): Payer: Medicare Other | Admitting: Cardiothoracic Surgery

## 2016-08-21 ENCOUNTER — Encounter: Payer: Self-pay | Admitting: Cardiothoracic Surgery

## 2016-08-21 VITALS — BP 149/79 | HR 64 | Resp 18 | Ht 65.0 in | Wt 153.0 lb

## 2016-08-21 DIAGNOSIS — D381 Neoplasm of uncertain behavior of trachea, bronchus and lung: Secondary | ICD-10-CM | POA: Diagnosis not present

## 2016-08-21 NOTE — Progress Notes (Signed)
PCP is Harrison Mons, PA-C Referring Provider is Harrison Mons, PA-C  Chief Complaint  Patient presents with  . Lung Lesion    CT CHEST 06/28/16    HPI: Patient presents for discussion and recommendation of recently diagnosed 9 mm right lower lobe density with irregular margins noted after an episode of pneumonia treated earlier this spring. The patient has a smoking history, currently down to 2 cigarettes per day. She was treated with a Z-Pak in February when she developed congestion, productive cough, malaise, weight loss, and chest x-ray showed a right basilar infiltrate. After the antibiotic course was completed a follow-up chest x-ray demonstrated a possible nodular density and a subsequent CT scan confirmed a small irregular density in the right lower lobe. There were no abnormal mediastinal nodes noted. The patient has at least mild underlying emphysema. She presents here for surgical opinion regarding biopsy, what to do next.  She denies ongoing weight loss chest pain bone pain hemoptysis headache or shortness of breath.  Past Medical History:  Diagnosis Date  . GERD (gastroesophageal reflux disease)   . Heart murmur   . Hx of adenomatous colonic polyps 10/19/2015  . Hyperlipidemia   . Hypertension     Past Surgical History:  Procedure Laterality Date  . COLONOSCOPY  2003, 2017  . FRACTURE SURGERY     2007 -right tib /fib fracture ----07/2009 right femur fx after a fall  . FRACTURE SURGERY Right 2011   Femur  . PERIPHERAL VASCULAR CATHETERIZATION N/A 08/09/2014   Procedure: Aortic Arch Angiography;  Surgeon: Serafina Mitchell, MD;  Location: [place] [facility];  Service: Cardiovascular;  Laterality: N/A;  . PERIPHERAL VASCULAR CATHETERIZATION Right 08/09/2014   Procedure: Upper Extremity Angiography;  Surgeon: Serafina Mitchell, MD;  Location: [place] [facility];  Service: Cardiovascular;  Laterality: Right;  . ULTRASOUND GUIDANCE FOR VASCULAR ACCESS  08/09/2014   Procedure:  Ultrasound Guidance For Vascular Access;  Surgeon: Serafina Mitchell, MD;  Location: [place] [facility];  Service: Cardiovascular;;    Family History  Problem Relation Age of Onset  . Hypertension Mother   . Colon cancer Neg Hx   . Colon polyps Neg Hx   . Esophageal cancer Neg Hx   . Rectal cancer Neg Hx   . Stomach cancer Neg Hx     Social History Social History  Substance Use Topics  . Smoking status: Light Tobacco Smoker    Years: 20.00    Types: Cigarettes  . Smokeless tobacco: Never Used     Comment: has quit before- smokes 2 cigs a day   . Alcohol use No    Current Outpatient Prescriptions  Medication Sig Dispense Refill  . atorvastatin (LIPITOR) 20 MG tablet take 1 tablet by mouth every evening 30 tablet 3  . Azelastine HCl 0.15 % SOLN Place 2 sprays into both nostrils 2 (two) times daily. 30 mL 0  . bisoprolol-hydrochlorothiazide (ZIAC) 10-6.25 MG tablet take 1 tablet by mouth once daily 90 tablet 1  . fluticasone (FLOVENT HFA) 220 MCG/ACT inhaler Inhale 2 puffs into the lungs 2 (two) times daily. 1 Inhaler 12  . losartan (COZAAR) 50 MG tablet take 1 tablet by mouth once daily 90 tablet 1  . oxybutynin (DITROPAN-XL) 10 MG 24 hr tablet Take 1 tablet (10 mg total) by mouth at bedtime. 90 tablet 3  . oxyCODONE (OXYCONTIN) 10 mg 12 hr tablet Take 10 mg by mouth every 12 (twelve) hours.    [name] oxyCODONE-acetaminophen (PERCOCET) 10-325  MG tablet     . Vitamin D, Cholecalciferol, 1000 units CAPS Take 1,000 Units by mouth daily. Reported on 09/28/2015     No current facility-administered medications for this visit.     Allergies  Allergen Reactions  . Codeine Nausea Only  . Symproic [Naldemedine] Nausea And Vomiting    Review of Systems   The patient has history of a cardiac murmur and 4 years ago had echocardiogram showing mild-moderate aortic stenosis with a mean gradient of 28 mmHg and a estimated cannulated valve area of 0.94. She denies presyncope or chest pain.  The  patient has chronic pain syndrome from injuries to her right leg itches required extensive orthopedic surgery and is cared for by a pain clinic-providing narcotic prescription  No recent history of thoracic trauma but 5 years ago she fell on the ice and states she broke 3-4 ribs on the right side  Patient complains of significant recent anxiety and palpitations.  Otherwise review of systems is unremarkable  BP (!) 149/79 (BP Location: Right Arm, Patient Position: Sitting, Cuff Size: Normal)   Pulse 64   Resp 18   Ht 5\' 5"  (1.651 m)   Wt 153 lb (69.4 kg)   BMI 25.46 kg/m  Physical Exam     Physical Exam  General: Well-nourished middle-aged Caucasian female anxious but in no acute distress HEENT: Normocephalic pupils equal , dentition adequate Neck: Supple without JVD, adenopathy, or bruit Chest: Clear to auscultation, symmetrical breath sounds, no rhonchi, no tenderness             or deformity Cardiovascular: Regular rate and rhythm, 2/6  Murmur of AS no gallop, peripheral pulses             palpable in all extremities Abdomen:  Soft, nontender, no palpable mass or organomegaly Extremities: Warm, well-perfused, no clubbing cyanosis edema or tenderness,              no venous stasis changes of the legs Rectal/GU: Deferred Neuro: Grossly non--focal and symmetrical throughout Skin: Clean and dry without rash or ulceration   Diagnostic Tests:  CT scan images personally reviewed and counseled with patient showing the 9 mm density in the right lower lobe with irregular margins. There is no calcification noted but it appears likely inflammatory   there are no previous CT scans with which to compare  Impression:  recently discovered 9 mm right lower lobe density with irregular margins in a patient with a smoking history   Plan: I would not recommend biopsy. The risk of this being malignant is small and the potential for pneumothorax with a attempted transthoracic biopsy is  significant. At the current size it is likely the biopsy would be non-diagnostic   the patient will have a follow-up CT scan of the chest which has been scheduled by her primary physician in approximately 2 months. I will see her back to review that scan to discuss further evaluation or therapy.   I would recommend that the patient have a repeat echocardiogram as the study 4 years ago showed at least mild to moderate aortic stenosis. Len Childs, MD [facility] [phone]

## 2016-08-22 DIAGNOSIS — M79606 Pain in leg, unspecified: Secondary | ICD-10-CM | POA: Diagnosis not present

## 2016-08-22 DIAGNOSIS — Z79891 Long term (current) use of opiate analgesic: Secondary | ICD-10-CM | POA: Diagnosis not present

## 2016-08-22 DIAGNOSIS — M25569 Pain in unspecified knee: Secondary | ICD-10-CM | POA: Diagnosis not present

## 2016-08-22 DIAGNOSIS — Z79899 Other long term (current) drug therapy: Secondary | ICD-10-CM | POA: Diagnosis not present

## 2016-08-22 DIAGNOSIS — G894 Chronic pain syndrome: Secondary | ICD-10-CM | POA: Diagnosis not present

## 2016-08-22 DIAGNOSIS — M1990 Unspecified osteoarthritis, unspecified site: Secondary | ICD-10-CM | POA: Diagnosis not present

## 2016-09-13 ENCOUNTER — Ambulatory Visit: Payer: Medicare Other | Admitting: Physician Assistant

## 2016-09-19 ENCOUNTER — Ambulatory Visit (INDEPENDENT_AMBULATORY_CARE_PROVIDER_SITE_OTHER): Payer: Medicare Other | Admitting: Physician Assistant

## 2016-09-19 ENCOUNTER — Telehealth: Payer: Self-pay | Admitting: Physician Assistant

## 2016-09-19 ENCOUNTER — Encounter: Payer: Self-pay | Admitting: Physician Assistant

## 2016-09-19 VITALS — BP 112/59 | HR 53 | Temp 97.8°F | Resp 18 | Ht 64.76 in | Wt 150.6 lb

## 2016-09-19 DIAGNOSIS — J449 Chronic obstructive pulmonary disease, unspecified: Secondary | ICD-10-CM

## 2016-09-19 DIAGNOSIS — I080 Rheumatic disorders of both mitral and aortic valves: Secondary | ICD-10-CM | POA: Diagnosis not present

## 2016-09-19 DIAGNOSIS — F17200 Nicotine dependence, unspecified, uncomplicated: Secondary | ICD-10-CM

## 2016-09-19 DIAGNOSIS — E559 Vitamin D deficiency, unspecified: Secondary | ICD-10-CM

## 2016-09-19 DIAGNOSIS — G4700 Insomnia, unspecified: Secondary | ICD-10-CM | POA: Diagnosis not present

## 2016-09-19 DIAGNOSIS — R911 Solitary pulmonary nodule: Secondary | ICD-10-CM | POA: Diagnosis not present

## 2016-09-19 MED ORDER — VITAMIN D (ERGOCALCIFEROL) 1.25 MG (50000 UNIT) PO CAPS: 50000.0000 [IU] | ORAL_CAPSULE | ORAL | 1 refills | Status: DC

## 2016-09-19 MED ORDER — TRAZODONE HCL 100 MG PO TABS: 50.0000 mg | ORAL_TABLET | Freq: Every day | ORAL | 5 refills | Status: DC

## 2016-09-19 NOTE — Progress Notes (Signed)
Patient ID: Alexandra [name], female    DOB: 1950-06-20, 66 y.o.   MRN: [id number]  PCP: Harrison Mons, PA-C  Chief Complaint  Patient presents with  . COPD    f/u- medication     Subjective:   Presents for evaluation of COPD since starting inhaled steroid.  She notes that it's helping a lot. She has less cough and SOB. No irritation of the mouth or throat.  Regarding the lung nodule, she has seen Dr. Prescott Gum. He recommends against biopsy due to the small risk of malignancy and the potential for pneumothorax with transthoracic biopsy. Instead, she will have a follow-up scan next month and follow-up with Dr. Prescott Gum.  He also recommends that her echocardiogram be updated.  She is finding herself really worried about the lung nodule, having trouble sleeping at night. She is equally worried about a potential biopsy, which "doesn't sound very fun."    Review of Systems As above. No fever, chills, nausea, vomiting, CP.    Patient Active Problem List   Diagnosis Date Noted  . Nodule of right lung 07/05/2016  . Smoker 06/25/2016  . COPD (chronic obstructive pulmonary disease) ([place]) 05/22/2016  . Urinary frequency 02/20/2016  . Right knee DJD 12/09/2015  . Hx of adenomatous colonic polyps 10/19/2015  . Osteopenia 08/15/2015  . Femoral neck fracture ([place]) 07/16/2015  . Chronic pain syndrome 07/16/2015  . Primary localized osteoarthrosis of pelvic region 07/16/2015  . Anxiety state 07/16/2015  . Palpitations 07/16/2015  . Vitamin D deficiency 05/16/2015  . Aortic stenosis with mitral and aortic insufficiency 07/29/2014  . Hypertension 07/29/2014  . Hyperlipidemia 07/29/2014     Prior to Admission medications   Medication Sig Start Date End Date Taking? Authorizing Provider  atorvastatin (LIPITOR) 20 MG tablet take 1 tablet by mouth every evening 06/28/16  Yes Shavonda Wiedman, PA-C  Azelastine HCl 0.15 % SOLN Place 2 sprays into both nostrils 2 (two) times daily.  03/26/16  Yes Harrison Mons, PA-C  bisoprolol-hydrochlorothiazide [facility]) 10-6.25 MG tablet take 1 tablet by mouth once daily 04/22/16  Yes Nakyah Erdmann, PA-C  fluticasone (FLOVENT HFA) 220 MCG/ACT inhaler Inhale 2 puffs into the lungs 2 (two) times daily. 07/30/16  Yes Jermar Colter, PA-C  losartan (COZAAR) 50 MG tablet take 1 tablet by mouth once daily 04/22/16  Yes Chasya Zenz, PA-C  oxybutynin (DITROPAN-XL) 10 MG 24 hr tablet Take 1 tablet (10 mg total) by mouth at bedtime. 02/20/16  Yes Zamyiah Tino, PA-C  OxyCODONE ER (XTAMPZA ER) 9 MG C12A Take 9 mg by mouth 2 (two) times daily.   Yes [provider]  oxyCODONE-acetaminophen (PERCOCET) 10-325 MG tablet  02/05/16  Yes [provider]  oxyCODONE (OXYCONTIN) 10 mg 12 hr tablet Take 10 mg by mouth every 12 (twelve) hours.    [provider]  Vitamin D, Cholecalciferol, 1000 units CAPS Take 1,000 Units by mouth daily. Reported on 09/28/2015    [provider]     Allergies  Allergen Reactions  . Codeine Nausea Only  . Symproic [Naldemedine] Nausea And Vomiting       Objective:  Physical Exam  Constitutional: She is oriented to person, place, and time. She appears well-developed and well-nourished. She is active and cooperative. No distress.  BP (!) 112/59 (BP Location: Left Arm, Patient Position: Sitting, Cuff Size: Normal)   Pulse (!) 53   Temp 97.8 F (36.6 C) (Oral)   Resp 18   Ht 5' 4.76" (1.645  m)   Wt 150 lb 9.6 oz (68.3 kg)   SpO2 96%   BMI 25.24 kg/m   HENT:  Head: Normocephalic and atraumatic.  Right Ear: Hearing normal.  Left Ear: Hearing normal.  Eyes: Conjunctivae are normal. No scleral icterus.  Neck: Normal range of motion. Neck supple. No thyromegaly present.  Cardiovascular: Normal rate and regular rhythm.   Murmur heard.  Systolic murmur is present with a grade of 2/6  Pulses:      Radial pulses are 2+ on the right side, and 2+ on the left side.  Pulmonary/Chest:  Effort normal and breath sounds normal.  Lymphadenopathy:       Head (right side): No tonsillar, no preauricular, no posterior auricular and no occipital adenopathy present.       Head (left side): No tonsillar, no preauricular, no posterior auricular and no occipital adenopathy present.    She has no cervical adenopathy.       Right: No supraclavicular adenopathy present.       Left: No supraclavicular adenopathy present.  Neurological: She is alert and oriented to person, place, and time. No sensory deficit.  Skin: Skin is warm, dry and intact. No rash noted. No cyanosis or erythema. Nails show no clubbing.  Psychiatric: She has a normal mood and affect. Her speech is normal and behavior is normal.        Assessment & Plan:   Problem List Items Addressed This Visit    Aortic stenosis with mitral and aortic insufficiency    Refer to cardiology for re-evaluation, update echocardiogram.      Vitamin D deficiency    Intended to increase this following her most recent visit, but did not. Sent today.      Relevant Medications   Vitamin D, Ergocalciferol, (DRISDOL) 50000 units CAPS capsule   COPD (chronic obstructive pulmonary disease) (HCC) - Primary    Improved with inhaled steroid. Continue.      Smoker    Congratulated on continued efforts toward smoking cessation.      Nodule of right lung    Proceed with follow-up CT scan and visit with Dr. Prescott Gum.       Other Visit Diagnoses    Insomnia, unspecified type       Due to worry about lung nodule. Trial of trazodone.   Relevant Medications   traZODone (DESYREL) 100 MG tablet       Return in about 3 months (around 12/20/2016) for re-evaluation of sleep, etc.   Fara Chute, PA-C [facility] [place]

## 2016-09-19 NOTE — Assessment & Plan Note (Signed)
Intended to increase this following her most recent visit, but did not. Sent today.

## 2016-09-19 NOTE — Patient Instructions (Addendum)
I hope that the trazodone will help you sleep, and worry less. GREAT JOB on cutting back on the smoking!!!    IF you received an x-ray today, you will receive an invoice from [facility]. Please contact [facility] at [phone] with questions or concerns regarding your invoice.   IF you received labwork today, you will receive an invoice from [place]. Please contact [facility] at [phone] with questions or concerns regarding your invoice.   Our billing staff will not be able to assist you with questions regarding bills from these companies.  You will be contacted with the lab results as soon as they are available. The fastest way to get your results is to activate your My Chart account. Instructions are located on the last page of this paperwork. If you have not heard from [place] regarding the results in 2 weeks, please contact this office.    Did you know that you begin to benefit from quitting smoking within the first twenty minutes? It's TRUE.  At 20 minutes: -blood pressure decreases -pulse rate drops -body temperature of hands and feet increases  At 8 hours: -carbon monoxide level in blood drops to normal -oxygen level in blood increases to normal  At 24 hours: -the chance of heart attack decreases  At 48 hours: -nerve endings start regrowing -ability to smell and taste is enhanced  2 weeks-3 months: -circulation improves -walking becomes easier -lung function improves  1-9 months: -coughing, sinus congestion, fatigue and shortness of breath decreases  1 year: -excess risk of heart disease is decreased to HALF that of a smoker  5 years: Stroke risk is reduced to that of people who have never smoked  10 years: -risk of lung cancer drops to as little as half that of continuing smokers -risk of cancer of the mouth, throat, esophagus, bladder, kidney and pancreas decreases -risk of ulcer decreases  15 years -risk of heart disease is now similar  to that of people who have never smoked -risk of death returns to nearly the level of people who have never smoked

## 2016-09-19 NOTE — Assessment & Plan Note (Signed)
Refer to cardiology for re-evaluation, update echocardiogram.

## 2016-09-19 NOTE — Assessment & Plan Note (Signed)
Congratulated on continued efforts toward smoking cessation.

## 2016-09-19 NOTE — Telephone Encounter (Signed)
In re-reviewing Dr. Lucianne Lei Trigt's note, reminded that we need to update her echocardiogram, as she had at least mild to moderate aortic stenosis 4 years ago.  Advised patient that I would order the visit with cardiology for that purpose.

## 2016-09-19 NOTE — Assessment & Plan Note (Signed)
Proceed with follow-up CT scan and visit with Dr. Prescott Gum.

## 2016-09-19 NOTE — Assessment & Plan Note (Signed)
Improved with inhaled steroid. Continue.

## 2016-09-19 NOTE — Progress Notes (Signed)
Subjective:    Patient ID: Alexandra [name], female    DOB: September 30, 1950, 66 y.o.   MRN: [id number] PCP: Harrison Mons, PA-C Chief Complaint  Patient presents with  . COPD    f/u- medication    HPI: 66 y/o F presents for follow up of COPD and difficulty breathing. She was prescribed steroid in [place] on 5/8 when she was evaluated. It has helped her breath more easily and denies any difficulty breathing, cough, chest tightness, chest pains, fever or chills. She has been trying to quit smoking, only smoking 1-2 cigarettes a day. She is able to complete all of her daily activities without SOB. She admits to feeling fatigued but associated that with only sleeping about 2 hours a night. She states that a nodule was noted in her right lower lobe of her lung and the stress around that keeps her up at night.     Patient Active Problem List   Diagnosis Date Noted  . Nodule of right lung 07/05/2016  . Smoker 06/25/2016  . COPD (chronic obstructive pulmonary disease) ([place]) 05/22/2016  . Urinary frequency 02/20/2016  . Right knee DJD 12/09/2015  . Hx of adenomatous colonic polyps 10/19/2015  . Osteopenia 08/15/2015  . Femoral neck fracture ([place]) 07/16/2015  . Chronic pain syndrome 07/16/2015  . Primary localized osteoarthrosis of pelvic region 07/16/2015  . Anxiety state 07/16/2015  . Palpitations 07/16/2015  . Vitamin D deficiency 05/16/2015  . Aortic stenosis with mitral and aortic insufficiency 07/29/2014  . Hypertension 07/29/2014  . Hyperlipidemia 07/29/2014   Past Medical History:  Diagnosis Date  . GERD (gastroesophageal reflux disease)   . Heart murmur   . Hx of adenomatous colonic polyps 10/19/2015  . Hyperlipidemia   . Hypertension    Prior to Admission medications   Medication Sig Start Date End Date Taking? Authorizing Provider  atorvastatin (LIPITOR) 20 MG tablet take 1 tablet by mouth every evening 06/28/16  Yes Jeffery, Chelle, PA-C  Azelastine HCl 0.15 % SOLN Place 2 sprays  into both nostrils 2 (two) times daily. 03/26/16  Yes Harrison Mons, PA-C  bisoprolol-hydrochlorothiazide [facility]) 10-6.25 MG tablet take 1 tablet by mouth once daily 04/22/16  Yes Jeffery, Chelle, PA-C  fluticasone (FLOVENT HFA) 220 MCG/ACT inhaler Inhale 2 puffs into the lungs 2 (two) times daily. 07/30/16  Yes Jeffery, Chelle, PA-C  losartan (COZAAR) 50 MG tablet take 1 tablet by mouth once daily 04/22/16  Yes Jeffery, Chelle, PA-C  oxybutynin (DITROPAN-XL) 10 MG 24 hr tablet Take 1 tablet (10 mg total) by mouth at bedtime. 02/20/16  Yes Jeffery, Chelle, PA-C  oxyCODONE (OXYCONTIN) 10 mg 12 hr tablet Take 10 mg by mouth every 12 (twelve) hours.   Yes [provider]  oxyCODONE-acetaminophen (PERCOCET) 10-325 MG tablet  02/05/16  Yes [provider]  Vitamin D, Cholecalciferol, 1000 units CAPS Take 1,000 Units by mouth daily. Reported on 09/28/2015   Yes [provider]   Allergies  Allergen Reactions  . Codeine Nausea Only  . Symproic [Naldemedine] Nausea And Vomiting    Review of Systems  Constitutional: Positive for fatigue. Negative for chills and fever.  Respiratory: Negative for cough, chest tightness and shortness of breath.   Cardiovascular: Negative for chest pain, palpitations and leg swelling.  Genitourinary: Negative.   Musculoskeletal: Negative.   Skin: Negative.   Hematological: Negative.   Psychiatric/Behavioral: Positive for sleep disturbance (difficulty falling asleep and staying asleep. ).       Objective:   Physical Exam  Constitutional: She is oriented to person, place, and time. She appears well-developed and well-nourished. No distress.  BP (!) 112/59 (BP Location: Left Arm, Patient Position: Sitting, Cuff Size: Normal)   Pulse (!) 53   Temp 97.8 F (36.6 C) (Oral)   Resp 18   Ht 5' 4.76" (1.645 m)   Wt 150 lb 9.6 oz (68.3 kg)   SpO2 96%   BMI 25.24 kg/m    HENT:  Head: Normocephalic and atraumatic.  Eyes: Conjunctivae and EOM are  normal. Pupils are equal, round, and reactive to light.  Neck: Normal range of motion. Neck supple. No thyromegaly present.  Cardiovascular: Normal rate, regular rhythm and intact distal pulses.   Murmur heard. Pulses:      Radial pulses are 2+ on the right side, and 2+ on the left side.  Pulmonary/Chest: Effort normal and breath sounds normal. No respiratory distress. She has no wheezes. She has no rales. She exhibits no tenderness.  Lymphadenopathy:    She has no cervical adenopathy.  Neurological: She is alert and oriented to person, place, and time.  Skin: Skin is warm and dry. She is not diaphoretic.  Psychiatric: She has a normal mood and affect. Her behavior is normal. Judgment and thought content normal.       Assessment & Plan:  1. Chronic obstructive pulmonary disease, unspecified COPD type ([place]) Patient working on quitting smoking. Down to 2 cigarettes a day. Continue using Flovent HFA inhaler for symptom relief.   2. Nodule of right lung Scheduled for CT chest with contrast on 10/11/2016. Continue to follow up with Dr. Prescott Gum.  3. Smoker Down to 2 cigarettes a day. Continue to try and quit smoking completely.   4. Insomnia, unspecified type Difficulty staying asleep and maintaining sleep. Likely due to anxiety around right lung nodule. Start taking trazodone at bed time for anxiety relief and to help you sleep.  - traZODone (DESYREL) 100 MG tablet; Take 0.5-1 tablets (50-100 mg total) by mouth at bedtime.  Dispense: 30 tablet; Refill: 5  5. Vitamin D deficiency Chronic condition. Controlled with Cholecalciferol and Drisdol. Continue taking medications as prescribed.  - Vitamin D, Ergocalciferol, (DRISDOL) 50000 units CAPS capsule; Take 1 capsule (50,000 Units total) by mouth every 7 (seven) days.  Dispense: 30 capsule; Refill: 1  Return in about 3 months (around 12/20/2016) for re-evaluation of sleep, etc.

## 2016-09-20 DIAGNOSIS — M25569 Pain in unspecified knee: Secondary | ICD-10-CM | POA: Diagnosis not present

## 2016-09-20 DIAGNOSIS — M79606 Pain in leg, unspecified: Secondary | ICD-10-CM | POA: Diagnosis not present

## 2016-09-20 DIAGNOSIS — M1990 Unspecified osteoarthritis, unspecified site: Secondary | ICD-10-CM | POA: Diagnosis not present

## 2016-09-20 DIAGNOSIS — G894 Chronic pain syndrome: Secondary | ICD-10-CM | POA: Diagnosis not present

## 2016-09-22 HISTORY — PX: OTHER SURGICAL HISTORY: SHX169

## 2016-10-03 ENCOUNTER — Telehealth: Payer: Self-pay | Admitting: Family Medicine

## 2016-10-03 NOTE — Telephone Encounter (Signed)
PT CALLING TO LET us KNOW THAT SHE TOOK HERSELF OFF OF TRAZODONE SHE WAS HAVING SOB AND ANKLES SWELLING

## 2016-10-04 NOTE — Telephone Encounter (Signed)
If the adverse effects have resolved, she does not need to come in. Is she interested in something else to try to help her mood and sleep?

## 2016-10-04 NOTE — Telephone Encounter (Signed)
Would you like patient to come in for OV for further evaluation ?

## 2016-10-07 NOTE — Telephone Encounter (Signed)
Patient states the reactions have gone away and that whatever medication you think would help her sleep would be appreciated.

## 2016-10-10 MED ORDER — AMITRIPTYLINE HCL 25 MG PO TABS: 12.5000 mg | ORAL_TABLET | Freq: Every day | ORAL | 1 refills | Status: DC

## 2016-10-10 NOTE — Telephone Encounter (Signed)
IC pt. Advised Chelle's note.  Advised pt to call prior to needing refill as to how this is working.

## 2016-10-10 NOTE — Telephone Encounter (Signed)
I have sent a trial of amitriptyline. Let's see if that helps.  Meds ordered this encounter  Medications  . amitriptyline (ELAVIL) 25 MG tablet    Sig: Take 0.5-2 tablets (12.5-50 mg total) by mouth at bedtime.    Dispense:  60 tablet    Refill:  1    Order Specific Question:   Supervising Provider    Answer:   Brigitte Pulse, EVA N [[id number]]

## 2016-10-11 ENCOUNTER — Ambulatory Visit
Admission: RE | Admit: 2016-10-11 | Discharge: 2016-10-11 | Disposition: A | Discharge status: Home / Self Care | Payer: Medicare Other | Source: Ambulatory Visit | Attending: Physician Assistant | Admitting: Physician Assistant

## 2016-10-11 DIAGNOSIS — R911 Solitary pulmonary nodule: Secondary | ICD-10-CM | POA: Diagnosis not present

## 2016-10-11 MED ORDER — IOPAMIDOL (ISOVUE-300) INJECTION 61%
75.0000 mL | Freq: Once | INTRAVENOUS | Status: AC | PRN
  Administered 2016-10-11: 75 mL via INTRAVENOUS

## 2016-10-15 ENCOUNTER — Telehealth: Payer: Self-pay

## 2016-10-15 NOTE — Telephone Encounter (Signed)
PA started for Amitriptyline. Awaiting Response

## 2016-10-18 DIAGNOSIS — Z79891 Long term (current) use of opiate analgesic: Secondary | ICD-10-CM | POA: Diagnosis not present

## 2016-10-18 DIAGNOSIS — M79606 Pain in leg, unspecified: Secondary | ICD-10-CM | POA: Diagnosis not present

## 2016-10-18 DIAGNOSIS — M25569 Pain in unspecified knee: Secondary | ICD-10-CM | POA: Diagnosis not present

## 2016-10-18 DIAGNOSIS — G894 Chronic pain syndrome: Secondary | ICD-10-CM | POA: Diagnosis not present

## 2016-10-18 DIAGNOSIS — Z79899 Other long term (current) drug therapy: Secondary | ICD-10-CM | POA: Diagnosis not present

## 2016-10-18 DIAGNOSIS — M1990 Unspecified osteoarthritis, unspecified site: Secondary | ICD-10-CM | POA: Diagnosis not present

## 2016-10-23 ENCOUNTER — Encounter: Payer: Self-pay | Admitting: Cardiothoracic Surgery

## 2016-10-23 ENCOUNTER — Other Ambulatory Visit: Payer: Self-pay | Admitting: *Deleted

## 2016-10-23 ENCOUNTER — Ambulatory Visit (INDEPENDENT_AMBULATORY_CARE_PROVIDER_SITE_OTHER): Payer: Medicare Other | Admitting: Cardiothoracic Surgery

## 2016-10-23 VITALS — BP 164/78 | HR 61 | Resp 18 | Ht 64.75 in | Wt 150.0 lb

## 2016-10-23 DIAGNOSIS — D381 Neoplasm of uncertain behavior of trachea, bronchus and lung: Secondary | ICD-10-CM | POA: Diagnosis not present

## 2016-10-23 DIAGNOSIS — I340 Nonrheumatic mitral (valve) insufficiency: Secondary | ICD-10-CM

## 2016-10-23 DIAGNOSIS — I080 Rheumatic disorders of both mitral and aortic valves: Secondary | ICD-10-CM

## 2016-10-23 HISTORY — DX: Nonrheumatic mitral (valve) insufficiency: I34.0
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
## 2016-10-24 ENCOUNTER — Other Ambulatory Visit: Payer: Self-pay | Admitting: Physician Assistant

## 2016-10-30 ENCOUNTER — Other Ambulatory Visit: Payer: Self-pay

## 2016-10-30 ENCOUNTER — Ambulatory Visit (HOSPITAL_COMMUNITY): Payer: Medicare Other | Attending: Cardiology

## 2016-10-30 DIAGNOSIS — Z720 Tobacco use: Secondary | ICD-10-CM | POA: Insufficient documentation

## 2016-10-30 DIAGNOSIS — I2721 Secondary pulmonary arterial hypertension: Secondary | ICD-10-CM | POA: Insufficient documentation

## 2016-10-30 DIAGNOSIS — E785 Hyperlipidemia, unspecified: Secondary | ICD-10-CM | POA: Diagnosis not present

## 2016-10-30 DIAGNOSIS — I080 Rheumatic disorders of both mitral and aortic valves: Secondary | ICD-10-CM | POA: Insufficient documentation

## 2016-10-30 DIAGNOSIS — I119 Hypertensive heart disease without heart failure: Secondary | ICD-10-CM | POA: Insufficient documentation

## 2016-10-30 HISTORY — PX: TRANSTHORACIC ECHOCARDIOGRAM: SHX275

## 2016-10-31 ENCOUNTER — Other Ambulatory Visit: Payer: Self-pay | Admitting: Physician Assistant

## 2016-11-06 ENCOUNTER — Encounter: Payer: Self-pay | Admitting: Cardiothoracic Surgery

## 2016-11-06 ENCOUNTER — Encounter: Payer: Medicare Other | Admitting: Cardiothoracic Surgery

## 2016-11-06 ENCOUNTER — Ambulatory Visit (INDEPENDENT_AMBULATORY_CARE_PROVIDER_SITE_OTHER): Payer: Medicare Other | Admitting: Cardiothoracic Surgery

## 2016-11-06 VITALS — BP 185/88 | HR 70 | Resp 20 | Ht 64.75 in | Wt 150.0 lb

## 2016-11-06 DIAGNOSIS — D381 Neoplasm of uncertain behavior of trachea, bronchus and lung: Secondary | ICD-10-CM

## 2016-11-06 DIAGNOSIS — I350 Nonrheumatic aortic (valve) stenosis: Secondary | ICD-10-CM | POA: Diagnosis not present

## 2016-11-06 NOTE — Progress Notes (Signed)
PCP is Harrison Mons, PA-C Referring Provider is Harrison Mons, PA-C  Chief Complaint  Patient presents with  . Aortic Stenosis    3 week f/u review ECHO 10/30/16    HPI: The patient returns after echocardiogram to assess for severity of aortic stenosis. She was initially seen for evaluation of a pulmonary nodule which appears to be low risk for malignancy. Follow-up CT scan is scheduled in October. She has tried to quit smoking is down down to 1 cigarette daily.  Since her last echocardiogram 4 years ago her aortic stenosis is now moderate-severe with a calculus peak gradient of 64 mmHg. The patient has evidence of pulmonary hypertension as well. There is mild-moderate mitral regurgitation. The patient is having symptoms of palpitations and dyspnea. She is scheduled to be evaluated by cardiology to assess the severity of her aortic stenosis and possible right and left heart cath.  Past Medical History:  Diagnosis Date  . GERD (gastroesophageal reflux disease)   . Heart murmur   . Hx of adenomatous colonic polyps 10/19/2015  . Hyperlipidemia   . Hypertension     Past Surgical History:  Procedure Laterality Date  . COLONOSCOPY  2003, 2017  . FRACTURE SURGERY     2007 -right tib /fib fracture ----07/2009 right femur fx after a fall  . FRACTURE SURGERY Right 2011   Femur  . PERIPHERAL VASCULAR CATHETERIZATION N/A 08/09/2014   Procedure: Aortic Arch Angiography;  Surgeon: Serafina Mitchell, MD;  Location: [facility];  Service: Cardiovascular;  Laterality: N/A;  . PERIPHERAL VASCULAR CATHETERIZATION Right 08/09/2014   Procedure: Upper Extremity Angiography;  Surgeon: Serafina Mitchell, MD;  Location: [facility];  Service: Cardiovascular;  Laterality: Right;  . ULTRASOUND GUIDANCE FOR VASCULAR ACCESS  08/09/2014   Procedure: Ultrasound Guidance For Vascular Access;  Surgeon: Serafina Mitchell, MD;  Location: [facility];  Service: Cardiovascular;;    Family History   Problem Relation Age of Onset  . Hypertension Mother   . Colon cancer Neg Hx   . Colon polyps Neg Hx   . Esophageal cancer Neg Hx   . Rectal cancer Neg Hx   . Stomach cancer Neg Hx     Social History Social History  Substance Use Topics  . Smoking status: Light Tobacco Smoker    Years: 20.00    Types: Cigarettes  . Smokeless tobacco: Never Used     Comment: has quit before- smokes 2 cigs a day   . Alcohol use No    Current Outpatient Prescriptions  Medication Sig Dispense Refill  . amitriptyline (ELAVIL) 25 MG tablet Take 0.5-2 tablets (12.5-50 mg total) by mouth at bedtime. 60 tablet 1  . atorvastatin (LIPITOR) 20 MG tablet take 1 tablet by mouth every evening 30 tablet 3  . Azelastine HCl 0.15 % SOLN Place 2 sprays into both nostrils 2 (two) times daily. 30 mL 0  . bisoprolol-hydrochlorothiazide (ZIAC) 10-6.25 MG tablet take 1 tablet by mouth once daily 90 tablet 1  . fluticasone (FLOVENT HFA) 220 MCG/ACT inhaler Inhale 2 puffs into the lungs 2 (two) times daily. 1 Inhaler 12  . losartan (COZAAR) 50 MG tablet take 1 tablet by mouth once daily 90 tablet 1  . oxybutynin (DITROPAN-XL) 10 MG 24 hr tablet Take 1 tablet (10 mg total) by mouth at bedtime. 90 tablet 3  . OxyCODONE ER (XTAMPZA ER) 9 MG C12A Take 9 mg by mouth every 12 (twelve) hours.     [name]  oxyCODONE-acetaminophen (PERCOCET) 10-325 MG tablet every 4 (four) hours as needed.     . Vitamin D, Ergocalciferol, (DRISDOL) 50000 units CAPS capsule Take 1 capsule (50,000 Units total) by mouth every 7 (seven) days. 30 capsule 1   No current facility-administered medications for this visit.     Allergies  Allergen Reactions  . Codeine Nausea Only  . Symproic [Naldemedine] Nausea And Vomiting    Review of Systems  No chest pain No syncope Positive symptoms of palpitation and shortness of breath  BP (!) 185/88   Pulse 70   Resp 20   Ht 5' 4.75" (1.645 m)   Wt 150 lb (68 kg)   SpO2 97% Comment: RA  BMI 25.15 kg/m   Physical Exam      Exam    General- alert and comfortable   Lungs- clear without rales, wheezes   Cor- regular rate and rhythm, 3/6  AS murmur , no gallop   Abdomen- soft, non-tender   Extremities - warm, non-tender, minimal edema   Neuro- oriented, appropriate, no focal weakness   Diagnostic Tests: Echocardiogram images personally reviewed and counseled with patient. She has probable severe aortic stenosis  Impression: She is being evaluated by cardiology in the appointment has been set up.  Plan: She will return for surgical consultation if aortic valve replacement is recommended.   Len Childs, MD [facility] [phone]

## 2016-11-14 DIAGNOSIS — M79606 Pain in leg, unspecified: Secondary | ICD-10-CM | POA: Diagnosis not present

## 2016-11-14 DIAGNOSIS — Z79891 Long term (current) use of opiate analgesic: Secondary | ICD-10-CM | POA: Diagnosis not present

## 2016-11-14 DIAGNOSIS — Z79899 Other long term (current) drug therapy: Secondary | ICD-10-CM | POA: Diagnosis not present

## 2016-11-14 DIAGNOSIS — M1990 Unspecified osteoarthritis, unspecified site: Secondary | ICD-10-CM | POA: Diagnosis not present

## 2016-11-14 DIAGNOSIS — G894 Chronic pain syndrome: Secondary | ICD-10-CM | POA: Diagnosis not present

## 2016-11-14 DIAGNOSIS — M25569 Pain in unspecified knee: Secondary | ICD-10-CM | POA: Diagnosis not present

## 2016-11-17 DIAGNOSIS — Z23 Encounter for immunization: Secondary | ICD-10-CM | POA: Diagnosis not present

## 2016-11-28 ENCOUNTER — Ambulatory Visit (INDEPENDENT_AMBULATORY_CARE_PROVIDER_SITE_OTHER): Payer: Medicare Other | Admitting: Cardiology

## 2016-11-28 ENCOUNTER — Encounter: Payer: Self-pay | Admitting: Cardiology

## 2016-11-28 VITALS — BP 150/88 | HR 63 | Ht 65.0 in | Wt 154.0 lb

## 2016-11-28 DIAGNOSIS — R079 Chest pain, unspecified: Secondary | ICD-10-CM

## 2016-11-28 DIAGNOSIS — R0609 Other forms of dyspnea: Secondary | ICD-10-CM | POA: Diagnosis not present

## 2016-11-28 DIAGNOSIS — I080 Rheumatic disorders of both mitral and aortic valves: Secondary | ICD-10-CM

## 2016-11-28 DIAGNOSIS — E784 Other hyperlipidemia: Secondary | ICD-10-CM

## 2016-11-28 DIAGNOSIS — I2510 Atherosclerotic heart disease of native coronary artery without angina pectoris: Secondary | ICD-10-CM

## 2016-11-28 DIAGNOSIS — F17200 Nicotine dependence, unspecified, uncomplicated: Secondary | ICD-10-CM

## 2016-11-28 DIAGNOSIS — D689 Coagulation defect, unspecified: Secondary | ICD-10-CM | POA: Diagnosis not present

## 2016-11-28 DIAGNOSIS — R002 Palpitations: Secondary | ICD-10-CM

## 2016-11-28 DIAGNOSIS — Z01818 Encounter for other preprocedural examination: Secondary | ICD-10-CM | POA: Diagnosis not present

## 2016-11-28 DIAGNOSIS — I10 Essential (primary) hypertension: Secondary | ICD-10-CM | POA: Diagnosis not present

## 2016-11-28 DIAGNOSIS — E7849 Other hyperlipidemia: Secondary | ICD-10-CM

## 2016-11-28 HISTORY — DX: Atherosclerotic heart disease of native coronary artery without angina pectoris: I25.10

## 2016-11-28 NOTE — Patient Instructions (Addendum)
   [facility] [address] [place] [address] Dept: [phone] Loc: [place]  11/28/2016  You are scheduled for a Cardiac Catheterization on Friday, September 14 with Dr. Glenetta Hew.  1. Please arrive at the [facility] (Main Entrance A) at [facility]: [address] [place], [place] [address] at 8:00 AM (two hours before your procedure to ensure your preparation). Free valet parking service is available.   Special note: Every effort is made to have your procedure done on time. Please understand that emergencies sometimes delay scheduled procedures.  2. Diet: Do not eat or drink anything after midnight prior to your procedure except sips of water to take medications.  3. Labs: You will need to have blood drawn on Thursday, September 6 at [place] [address], [place]  Open: 8am - 4pm (Lunch 12:30 - 1:30) . You do not need to be fasting.  4. Medication instructions in preparation for your procedure:    Stop taking, LOSARTAN on Friday, September 14.      On the morning of your procedure, take your Aspirin 81 mg and any morning medicines NOT listed above.  You may use sips of water.  5. Plan for one night stay--bring personal belongings. 6. Bring a current list of your medications and current insurance cards. 7. You MUST have a responsible person to drive you home. 8. Someone MUST be with you the first 24 hours after you arrive home or your discharge will be delayed. 9. Please wear clothes that are easy to get on and off and wear slip-on shoes.  Thank you for allowing [place] to care for you!   -- [facility]     Your physician recommends that you schedule a follow-up appointment in 3 weeks with Dr Ellyn Hack.

## 2016-11-28 NOTE — Progress Notes (Signed)
PCP: Harrison Mons, PA-C  Clinic Note: Chief Complaint  Patient presents with  . New Patient (Initial Visit)    here aortic stenosis    HPI: Alexandra [name] is a 66 y.o. female with a PMH below who presents today for Cardiology evaluation and diagnosis of aortic stenosis. She is Referred at the request of.of Prescott Gum, Collier Salina, MD. She is a long-term smoker (down to 1-2/day)  with history of hypertension and hyperlipidemia who was seen by Dr. Prescott Gum for evaluation of aortic stenosis on August 15.  Abbey Chatters Manera had been seen Dr. Prescott Gum for her abnormal CT of the chest (pulmonary nodule) and there was concern for murmur with thoracic aortic calcification and aortic valve calcification leading to an echocardiogram. Dr. Prescott Gum is now referred her to cardiology for right and left heart catheterization.  Recent Hospitalizations: none.  Studies Personally Reviewed - (if available, images/films reviewed: From Epic Chart or Care Everywhere)  Transthoracic Echocardiogram October 30, 2008: Month since he LVH. EF 55-60%. No RWMA, Gr 2 DD. Mod-Severe AS (mean-peak Gradient 31 mmHG - 64 mmHg), Mod MR. Mod LA dilation, Mod PA HTN - peak pressure ~54 mmHg).  Indicates progression of disease from 2014 (mod AS)  CT of chest July 2015: Thoracic aortic calcification without dilatation of the thoracic aorta. Coronary artery calcification also noted along with aortic valve calcification. Pulmonary arteries within normal limits. Mild emphysematous changes with mild left lower lobe scarring.  Aortic Arch Angoigraphy May 2016: for non-healing finger wound              #1  Type 1 aortic arch               #3  no significant stenosis, aneurysmal degeneration, or dissection, or luminal irregularity is seen within the right subclavian, axillary, brachial, or radial artery.  The ulnar artery appears occluded in the distal forearm.  This appears to be a chronic process  Interval History: Alexandra [name] presents here  today for cardiology evaluation, a bit scared because of her family history of coronary disease. Her brother recently died of an MI at age 73. She notes that she has been persistently short of breath for years (which she is attributed to her lung disease from smoking). She says mostly she feels short of breath in the morning and oftentimes will use her inhaler to help it. She denies any orthopnea symptoms but she does occasionally wake up and sit up to catch her breath. Pretty much these symptoms shortness of breath and orthopnea PND have been occurring since she had pneumonia back in 2012. She sleeps on at least 2 pillows ever since. Which she notes is that sometimes in the night and sometimes during the day when she is not doing anything she'll feel her heart pounding/beating fast for a few minutes at a time and she'll feel notably short of breath with this. These episodes don't last more than a minute or 2, but they are very concerning to her. She denies any chest discomfort except for these pounding heartbeats. No exertional chest discomfort. No significant edema. She had an episode back in February where she blacked out and is not sure what happened. She's had occasional episodes where she's felt similar to how she did at that time but did not actually pass out. That has not been occurring over the last month or so however. No TIA or amaurosis fugax symptoms.  No claudication - although I did not ask this question  in extensive detail..  ROS: A comprehensive was performed. Review of Systems  Constitutional: Positive for malaise/fatigue.  HENT: Negative for congestion and nosebleeds.   Respiratory: Positive for cough (Mostly in the mornings), shortness of breath and wheezing (Mostly in the mornings).   Cardiovascular: Positive for palpitations.  Gastrointestinal: Positive for heartburn (Depending what she eats). Negative for abdominal pain, blood in stool, melena and vomiting.  Musculoskeletal:  Negative for falls and joint pain.  Neurological: Positive for dizziness. Negative for seizures and loss of consciousness (None since February).  Psychiatric/Behavioral: Negative for depression and memory loss. The patient has insomnia (Has a hard time getting to sleep). The patient is not nervous/anxious.   All other systems reviewed and are negative.  I have reviewed and (if needed) personally updated the patient's problem list, medications, allergies, past medical and surgical history, social and family history.   Past Medical History:  Diagnosis Date  . GERD (gastroesophageal reflux disease)   . Heart murmur   . Hx of adenomatous colonic polyps 10/19/2015  . Hyperlipidemia   . Hypertension     Past Surgical History:  Procedure Laterality Date  . COLONOSCOPY  2003, 2017  . FRACTURE SURGERY     2007 -right tib /fib fracture ----07/2009 right femur fx after a fall  . FRACTURE SURGERY Right 2011   Femur  . PERIPHERAL VASCULAR CATHETERIZATION N/A 08/09/2014   Procedure: Aortic Arch Angiography;  Surgeon: Serafina Mitchell, MD;  Location: [facility];  Service: Cardiovascular;  Laterality: N/A;  . PERIPHERAL VASCULAR CATHETERIZATION Right 08/09/2014   Procedure: Upper Extremity Angiography;  Surgeon: Serafina Mitchell, MD;  Location: [facility];  Service: Cardiovascular;  Laterality: Right;  . ULTRASOUND GUIDANCE FOR VASCULAR ACCESS  08/09/2014   Procedure: Ultrasound Guidance For Vascular Access;  Surgeon: Serafina Mitchell, MD;  Location: [facility];  Service: Cardiovascular;;    Current Meds  Medication Sig  . amitriptyline (ELAVIL) 25 MG tablet Take 0.5-2 tablets (12.5-50 mg total) by mouth at bedtime.  [name] atorvastatin (LIPITOR) 20 MG tablet take 1 tablet by mouth every evening  . Azelastine HCl 0.15 % SOLN Place 2 sprays into both nostrils 2 (two) times daily.  . bisoprolol-hydrochlorothiazide (ZIAC) 10-6.25 MG tablet take 1 tablet by mouth once daily  . fluticasone  (FLOVENT HFA) 220 MCG/ACT inhaler Inhale 2 puffs into the lungs 2 (two) times daily.  [name] losartan (COZAAR) 50 MG tablet take 1 tablet by mouth once daily  . oxybutynin (DITROPAN-XL) 10 MG 24 hr tablet Take 1 tablet (10 mg total) by mouth at bedtime.  . OxyCODONE ER (XTAMPZA ER) 9 MG C12A Take 9 mg by mouth every 12 (twelve) hours.   [name] oxyCODONE-acetaminophen (PERCOCET) 10-325 MG tablet every 4 (four) hours as needed.   . Vitamin D, Ergocalciferol, (DRISDOL) 50000 units CAPS capsule Take 1 capsule (50,000 Units total) by mouth every 7 (seven) days.    Allergies  Allergen Reactions  . Codeine Nausea Only  . Symproic [Naldemedine] Nausea And Vomiting    Social History   Social History  . Marital status: Married    Spouse name: [name]  . Number of children: 3  . Years of education: 12th grade   Occupational History  . retired Aeronautical engineer     previously self-employed, now relies on [employer]   Social History Main Topics  . Smoking status: Light Tobacco Smoker    Years: 20.00    Types: Cigarettes  . Smokeless  tobacco: Never Used     Comment: has quit before- smokes 2 cigs a day   . Alcohol use No  . Drug use: No     Comment: +cocaine in urine 12/19/2014  . Sexual activity: Yes    Partners: Male   Other Topics Concern  . None   Social History Narrative   Lives with her husband and her middle son.   Her two older sons are products of a previous relationship.   Her youngest son is from her current marriage.   Oldest and youngest sons live nearby.   She has total of 3 children and 6 grandchildren with 2 great-grandchildren.   She does not exercise because she has pain in her right leg.   Family History family history includes Heart attack in her brother; Heart disease in her father and mother; Hypertension in her mother.  Wt Readings from Last 3 Encounters:  11/28/16 154 lb (69.9 kg)  11/06/16 150 lb (68 kg)  10/23/16 150 lb (68 kg)    PHYSICAL  EXAM BP (!) 150/88   Pulse 63   Ht 5\' 5"  (1.651 m)   Wt 154 lb (69.9 kg)   BMI 25.63 kg/m  Physical Exam  Constitutional: She is oriented to person, place, and time. She appears well-developed and well-nourished. No distress.  Well-groomed.  HENT:  Head: Normocephalic and atraumatic.  Mouth/Throat: Oropharynx is clear and moist. No oropharyngeal exudate.  Eyes: Pupils are equal, round, and reactive to light. Conjunctivae and EOM are normal. No scleral icterus.  Neck: Normal range of motion. Neck supple. No hepatojugular reflux and no JVD present. Carotid bruit is not present.  Cardiovascular: Normal rate, regular rhythm and intact distal pulses.  Exam reveals gallop (Cannot exclude gallop. Difficult to hear. Some murmur).   Murmur heard.  Harsh crescendo-decrescendo mid to late systolic murmur is present with a grade of 3/6  at the upper right sternal border, upper left sternal border radiating to the neck Pulmonary/Chest: Effort normal and breath sounds normal. No respiratory distress.  Baseline mild accessory muscle use, nonlabored. Mild diffuse interstitial sounds, but no wheezes rales or rhonchi  Abdominal: Soft. Bowel sounds are normal. She exhibits no distension. There is no tenderness. There is no rebound.  Musculoskeletal: Normal range of motion. She exhibits no edema or deformity.  Neurological: She is alert and oriented to person, place, and time. No cranial nerve deficit.  Skin: Skin is warm and dry. No rash noted. No erythema.  She has thick leathery smoker skin  Psychiatric: She has a normal mood and affect. Her behavior is normal. Judgment and thought content normal.  Somewhat subdued and flat affect. Normal mood  Nursing note and vitals reviewed.    Adult ECG Report  Rate: 63 ;  Rhythm: normal sinus rhythm and Left atrial enlargement. Left bundle branch block with repolarization changes.;   Narrative Interpretation: Stable EKG   Other studies Reviewed: Additional  studies/ records that were reviewed today include:  Recent Labs:   Lab Results  Component Value Date   CREATININE 0.73 11/28/2016   BUN 10 11/28/2016   NA 144 11/28/2016   K 4.6 11/28/2016   CL 103 11/28/2016   CO2 27 11/28/2016   Lab Results  Component Value Date   CHOL 137 07/30/2016   HDL 44 07/30/2016   LDLCALC 75 07/30/2016   TRIG 89 07/30/2016   CHOLHDL 3.1 07/30/2016   ASSESSMENT / PLAN: Problem List Items Addressed This Visit    Aortic  stenosis with mitral and aortic insufficiency - Primary (Chronic)    Moderate to severe if not severe aortic stenosis by echocardiogram in a patient who has had an episode of syncope and has had near syncope with palpitations. She also is having intermittent chest discomfort and dyspnea. I'm concerned that she may have worse aortic stenosis th is suggested by the echocardiogram. I agree with Dr. Prescott Gum assessment in this regard. Plan: Right and left heart catheterization to evaluate for coronary ischemia as well as significance of aortic valve.  Performing MD:  Glenetta Hew, M.D., M.S.  Procedure:  Right and Left Heart Catheterization With Native Coronary Angiography  The procedure with Risks/Benefits/Alternatives and Indications was reviewed with the patient.  All questions were answered.    Risks / Complications include, but not limited to: Death, MI, CVA/TIA, VF/VT (with defibrillation), Bradycardia (need for temporary pacer placement), contrast induced nephropathy, bleeding / bruising / hematoma / pseudoaneurysm, vascular or coronary injury (with possible emergent CT or Vascular Surgery), adverse medication reactions, infection.  Additional risks involving the use of radiation with the possibility of radiation burns and cancer were explained in detail.  The patient voiced understanding and agree to proceed.         Relevant Orders   EKG 12-Lead (Completed)   LEFT AND RIGHT HEART CATHETERIZATION WITH CORONARY ANGIOGRAM   Chest  pain with moderate risk for cardiac etiology    She is having intermittent spells of chest discomfort which are probably more related to palpitations, with coronary calcification and aortic stenosis, we'll skip a nuclear stress test and go for coronary angiography for complete evaluation.      Relevant Orders   EKG 12-Lead (Completed)   Basic metabolic panel (Completed)   CBC (Completed)   LEFT AND RIGHT HEART CATHETERIZATION WITH CORONARY ANGIOGRAM   Coronary artery calcification seen on CAT scan (Chronic)    Chronic calcification noted on CT scan in a patient with aortic stenosis. With her having some significant palpitation episodes and dizziness, dyspnea as well as a syncopal episode, I'm concerned that she has had a decline in function and may be Close to Requiring Aortic Valve Surgery. Plan: Evaluate Coronary Anatomy with Left and Right Heart Catheterization.  Continue statin for risk factor modification.  She is on a beta blocker already along with ARB.       Relevant Orders   EKG 12-Lead (Completed)   LEFT AND RIGHT HEART CATHETERIZATION WITH CORONARY ANGIOGRAM   DOE (dyspnea on exertion) (Chronic)    Probably multifactorial from COPD, but can also not exclude aortic stenosis mediated dyspnea or coronary disease media dyspnea. She has pulmonary hypertension noted on her echocardiogram which would be nice to evaluate in detail. Plan: Right and left heart cath      Relevant Orders   EKG 12-Lead (Completed)   Basic metabolic panel (Completed)   CBC (Completed)   Protime-INR (Completed)   LEFT AND RIGHT HEART CATHETERIZATION WITH CORONARY ANGIOGRAM   Essential hypertension (Chronic)    Blood pressure is high today. She seems quite anxious, and indicates that her blood pressure is not usually this high. We will assess pressures in the heart cath lab. We still have room to titrate up losartan if necessary.      Hyperlipidemia (Chronic)    Relatively well-controlled lipids at  this point on current dose of statin.  At present she is probably within goal of a 3 C3 sure that she has notable coronary disease.  Palpitations (Chronic)    She is on beta blocker, if symptoms are persistent following her catheterization would consider either 48 hour or 2 week event monitor. Main interest would be to exclude arrhythmias.      Relevant Orders   EKG 12-Lead (Completed)   Basic metabolic panel (Completed)   CBC (Completed)   LEFT AND RIGHT HEART CATHETERIZATION WITH CORONARY ANGIOGRAM   Smoker    She continues to push toward quitting smoking is down to just a few cigarettes in the course of the day.       Other Visit Diagnoses    Pre-op testing       Relevant Orders   Basic metabolic panel (Completed)   CBC (Completed)   Protime-INR (Completed)   Clotting disorder ([place])       Relevant Orders   Protime-INR (Completed)      Current medicines are reviewed at length with the patient today. (+/- concerns) n/a The following changes have been made: no changes   Patient Instructions    [facility] [address] [place] [address] Dept: [phone] Loc: [place]  11/28/2016  You are scheduled for a Cardiac Catheterization on Friday, September 14 with Dr. Glenetta Hew.  1. Please arrive at the [facility] (Main Entrance A) at [facility]: [address] [place], [place] [address] at 8:00 AM (two hours before your procedure to ensure your preparation). Free valet parking service is available.   Special note: Every effort is made to have your procedure done on time. Please understand that emergencies sometimes delay scheduled procedures.  2. Diet: Do not eat or drink anything after midnight prior to your procedure except sips of water to take medications.  3. Labs: You will need to have blood drawn on Thursday, September 6 at [facility] [address], [place]  Open: 8am - 4pm (Lunch 12:30 - 1:30) . You do not need to be fasting.  4. Medication instructions in preparation for your procedure:    Stop taking, LOSARTAN on Friday, September 14.      On the morning of your procedure, take your Aspirin 81 mg and any morning medicines NOT listed above.  You may use sips of water.  5. Plan for one night stay--bring personal belongings. 6. Bring a current list of your medications and current insurance cards. 7. You MUST have a responsible person to drive you home. 8. Someone MUST be with you the first 24 hours after you arrive home or your discharge will be delayed. 9. Please wear clothes that are easy to get on and off and wear slip-on shoes.  Thank you for allowing [place] to care for you!   -- [facility]     Your physician recommends that you schedule a follow-up appointment in 3 weeks with Dr Ellyn Hack.   Studies Ordered:   Orders Placed This Encounter  Procedures  . Basic metabolic panel  . CBC  . Protime-INR  . EKG 12-Lead  . LEFT AND RIGHT HEART CATHETERIZATION WITH CORONARY Illene Silver, M.D., M.S. Interventional Cardiologist   Pager # [phone] Phone # [phone] [address] [place],  [address]

## 2016-11-29 LAB — CBC
Hematocrit: 39.6 % (ref 34.0–46.6)
Hemoglobin: 12.5 g/dL (ref 11.1–15.9)
MCH: 28.9 pg (ref 26.6–33.0)
MCHC: 31.6 g/dL (ref 31.5–35.7)
MCV: 92 fL (ref 79–97)
PLATELETS: 271 10*3/uL (ref 150–379)
RBC: 4.32 x10E6/uL (ref 3.77–5.28)
RDW: 13.8 % (ref 12.3–15.4)
WBC: 10.3 10*3/uL (ref 3.4–10.8)

## 2016-11-29 LAB — BASIC METABOLIC PANEL
BUN / CREAT RATIO: 14 (ref 12–28)
BUN: 10 mg/dL (ref 8–27)
CO2: 27 mmol/L (ref 20–29)
Calcium: 9.6 mg/dL (ref 8.7–10.3)
Chloride: 103 mmol/L (ref 96–106)
Creatinine, Ser: 0.73 mg/dL (ref 0.57–1.00)
GFR calc Af Amer: 99 mL/min/{1.73_m2} (ref >59)
GFR, EST NON AFRICAN AMERICAN: 85 mL/min/{1.73_m2} (ref >59)
Glucose: 86 mg/dL (ref 65–99)
Potassium: 4.6 mmol/L (ref 3.5–5.2)
Sodium: 144 mmol/L (ref 134–144)

## 2016-11-29 LAB — PROTIME-INR
INR: 1 (ref 0.8–1.2)
PROTHROMBIN TIME: 10.4 s (ref 9.1–12.0)

## 2016-11-30 ENCOUNTER — Encounter: Payer: Self-pay | Admitting: Cardiology

## 2016-11-30 NOTE — Assessment & Plan Note (Signed)
Relatively well-controlled lipids at this point on current dose of statin.  At present she is probably within goal of a 3 C3 sure that she has notable coronary disease.

## 2016-11-30 NOTE — Assessment & Plan Note (Signed)
She continues to push toward quitting smoking is down to just a few cigarettes in the course of the day.

## 2016-11-30 NOTE — Assessment & Plan Note (Signed)
Blood pressure is high today. She seems quite anxious, and indicates that her blood pressure is not usually this high. We will assess pressures in the heart cath lab. We still have room to titrate up losartan if necessary.

## 2016-11-30 NOTE — Assessment & Plan Note (Signed)
She is on beta blocker, if symptoms are persistent following her catheterization would consider either 48 hour or 2 week event monitor. Main interest would be to exclude arrhythmias.

## 2016-11-30 NOTE — Assessment & Plan Note (Signed)
She is having intermittent spells of chest discomfort which are probably more related to palpitations, with coronary calcification and aortic stenosis, we'll skip a nuclear stress test and go for coronary angiography for complete evaluation.

## 2016-11-30 NOTE — Assessment & Plan Note (Signed)
Probably multifactorial from COPD, but can also not exclude aortic stenosis mediated dyspnea or coronary disease media dyspnea. She has pulmonary hypertension noted on her echocardiogram which would be nice to evaluate in detail. Plan: Right and left heart cath

## 2016-11-30 NOTE — Assessment & Plan Note (Signed)
Moderate to severe if not severe aortic stenosis by echocardiogram in a patient who has had an episode of syncope and has had near syncope with palpitations. She also is having intermittent chest discomfort and dyspnea. I'm concerned that she may have worse aortic stenosis th is suggested by the echocardiogram. I agree with Dr. Prescott Gum assessment in this regard. Plan: Right and left heart catheterization to evaluate for coronary ischemia as well as significance of aortic valve.  Performing MD:  Glenetta Hew, M.D., M.S.  Procedure:  Right and Left Heart Catheterization With Native Coronary Angiography  The procedure with Risks/Benefits/Alternatives and Indications was reviewed with the patient.  All questions were answered.    Risks / Complications include, but not limited to: Death, MI, CVA/TIA, VF/VT (with defibrillation), Bradycardia (need for temporary pacer placement), contrast induced nephropathy, bleeding / bruising / hematoma / pseudoaneurysm, vascular or coronary injury (with possible emergent CT or Vascular Surgery), adverse medication reactions, infection.  Additional risks involving the use of radiation with the possibility of radiation burns and cancer were explained in detail.  The patient voiced understanding and agree to proceed.

## 2016-11-30 NOTE — Assessment & Plan Note (Signed)
Chronic calcification noted on CT scan in a patient with aortic stenosis. With her having some significant palpitation episodes and dizziness, dyspnea as well as a syncopal episode, I'm concerned that she has had a decline in function and may be Close to Requiring Aortic Valve Surgery. Plan: Evaluate Coronary Anatomy with Left and Right Heart Catheterization.  Continue statin for risk factor modification.  She is on a beta blocker already along with ARB.

## 2016-12-04 ENCOUNTER — Telehealth: Payer: Self-pay

## 2016-12-04 NOTE — Telephone Encounter (Signed)
Outreach made to Pt to confirm Friday catheterization.  Unable to reach Pt.  Unable to leave message.

## 2016-12-06 ENCOUNTER — Ambulatory Visit (HOSPITAL_COMMUNITY)
Admission: RE | Admit: 2016-12-06 | Discharge: 2016-12-06 | Disposition: A | Discharge status: Home / Self Care | Payer: Medicare Other | Source: Ambulatory Visit | Attending: Cardiology | Admitting: Cardiology

## 2016-12-06 ENCOUNTER — Encounter (HOSPITAL_COMMUNITY)
Admission: RE | Disposition: A | Discharge status: Home / Self Care | Payer: Self-pay | Source: Ambulatory Visit | Attending: Cardiology

## 2016-12-06 DIAGNOSIS — I080 Rheumatic disorders of both mitral and aortic valves: Secondary | ICD-10-CM

## 2016-12-06 DIAGNOSIS — R002 Palpitations: Secondary | ICD-10-CM

## 2016-12-06 DIAGNOSIS — Z01818 Encounter for other preprocedural examination: Secondary | ICD-10-CM

## 2016-12-06 DIAGNOSIS — I272 Pulmonary hypertension, unspecified: Secondary | ICD-10-CM | POA: Diagnosis not present

## 2016-12-06 DIAGNOSIS — I2510 Atherosclerotic heart disease of native coronary artery without angina pectoris: Secondary | ICD-10-CM | POA: Insufficient documentation

## 2016-12-06 DIAGNOSIS — Z7951 Long term (current) use of inhaled steroids: Secondary | ICD-10-CM | POA: Insufficient documentation

## 2016-12-06 DIAGNOSIS — K219 Gastro-esophageal reflux disease without esophagitis: Secondary | ICD-10-CM | POA: Diagnosis not present

## 2016-12-06 DIAGNOSIS — E785 Hyperlipidemia, unspecified: Secondary | ICD-10-CM | POA: Diagnosis not present

## 2016-12-06 DIAGNOSIS — I700 Atherosclerosis of aorta: Secondary | ICD-10-CM | POA: Insufficient documentation

## 2016-12-06 DIAGNOSIS — R0609 Other forms of dyspnea: Secondary | ICD-10-CM | POA: Diagnosis not present

## 2016-12-06 DIAGNOSIS — J449 Chronic obstructive pulmonary disease, unspecified: Secondary | ICD-10-CM | POA: Diagnosis not present

## 2016-12-06 DIAGNOSIS — I352 Nonrheumatic aortic (valve) stenosis with insufficiency: Secondary | ICD-10-CM | POA: Diagnosis not present

## 2016-12-06 DIAGNOSIS — I10 Essential (primary) hypertension: Secondary | ICD-10-CM | POA: Diagnosis not present

## 2016-12-06 DIAGNOSIS — R079 Chest pain, unspecified: Secondary | ICD-10-CM | POA: Diagnosis present

## 2016-12-06 DIAGNOSIS — Z8249 Family history of ischemic heart disease and other diseases of the circulatory system: Secondary | ICD-10-CM | POA: Diagnosis not present

## 2016-12-06 DIAGNOSIS — F17210 Nicotine dependence, cigarettes, uncomplicated: Secondary | ICD-10-CM | POA: Insufficient documentation

## 2016-12-06 HISTORY — PX: RIGHT/LEFT HEART CATH AND CORONARY ANGIOGRAPHY: CATH118266

## 2016-12-06 LAB — POCT I-STAT 3, ART BLOOD GAS (G3+)
Acid-Base Excess: 2 mmol/L (ref 0.0–2.0)
Acid-Base Excess: 2 mmol/L (ref 0.0–2.0)
Acid-Base Excess: 3 mmol/L — ABNORMAL HIGH (ref 0.0–2.0)
BICARBONATE: 26.4 mmol/L (ref 20.0–28.0)
Bicarbonate: 26.6 mmol/L (ref 20.0–28.0)
Bicarbonate: 27.7 mmol/L (ref 20.0–28.0)
O2 SAT: 65 %
O2 Saturation: 68 %
O2 Saturation: 98 %
PCO2 ART: 41.9 mmHg (ref 32.0–48.0)
PCO2 ART: 44.5 mmHg (ref 32.0–48.0)
PO2 ART: 34 mmHg — AB (ref 83.0–108.0)
PO2 ART: 92 mmHg (ref 83.0–108.0)
TCO2: 28 mmol/L (ref 22–32)
TCO2: 28 mmol/L (ref 22–32)
TCO2: 29 mmol/L (ref 22–32)
pCO2 arterial: 36.5 mmHg (ref 32.0–48.0)
pH, Arterial: 7.402 (ref 7.350–7.450)
pH, Arterial: 7.411 (ref 7.350–7.450)
pH, Arterial: 7.468 — ABNORMAL HIGH (ref 7.350–7.450)
pO2, Arterial: 35 mmHg — CL (ref 83.0–108.0)

## 2016-12-06 SURGERY — RIGHT/LEFT HEART CATH AND CORONARY ANGIOGRAPHY
Anesthesia: LOCAL

## 2016-12-06 MED ORDER — MIDAZOLAM HCL 2 MG/2ML IJ SOLN
INTRAMUSCULAR | Status: AC
  Filled 2016-12-06: qty 2

## 2016-12-06 MED ORDER — FENTANYL CITRATE (PF) 100 MCG/2ML IJ SOLN
INTRAMUSCULAR | Status: DC | PRN
  Administered 2016-12-06: 25 ug via INTRAVENOUS

## 2016-12-06 MED ORDER — SODIUM CHLORIDE 0.9% FLUSH: 3.0000 mL | Freq: Two times a day (BID) | INTRAVENOUS | Status: DC

## 2016-12-06 MED ORDER — LOSARTAN POTASSIUM 100 MG PO TABS
100.0000 mg | ORAL_TABLET | Freq: Every day | ORAL | 3 refills | Status: DC
Start: 2016-12-06 — End: 2017-05-22

## 2016-12-06 MED ORDER — LIDOCAINE HCL (PF) 1 % IJ SOLN
INTRAMUSCULAR | Status: AC
  Filled 2016-12-06: qty 30

## 2016-12-06 MED ORDER — SODIUM CHLORIDE 0.9 % IV SOLN
INTRAVENOUS | Status: DC
  Administered 2016-12-06: 09:00:00 via INTRAVENOUS

## 2016-12-06 MED ORDER — LABETALOL HCL 5 MG/ML IV SOLN
INTRAVENOUS | Status: AC
  Filled 2016-12-06: qty 4

## 2016-12-06 MED ORDER — HEPARIN SODIUM (PORCINE) 1000 UNIT/ML IJ SOLN
INTRAMUSCULAR | Status: AC
  Filled 2016-12-06: qty 1

## 2016-12-06 MED ORDER — HEPARIN (PORCINE) IN NACL 2-0.9 UNIT/ML-% IJ SOLN
INTRAMUSCULAR | Status: AC | PRN
  Administered 2016-12-06: 1000 mL

## 2016-12-06 MED ORDER — SODIUM CHLORIDE 0.9 % IV SOLN: 250.0000 mL | INTRAVENOUS | Status: DC | PRN

## 2016-12-06 MED ORDER — ONDANSETRON HCL 4 MG/2ML IJ SOLN: 4.0000 mg | Freq: Four times a day (QID) | INTRAMUSCULAR | Status: DC | PRN

## 2016-12-06 MED ORDER — FENTANYL CITRATE (PF) 100 MCG/2ML IJ SOLN
INTRAMUSCULAR | Status: AC
  Filled 2016-12-06: qty 2

## 2016-12-06 MED ORDER — IOPAMIDOL (ISOVUE-370) INJECTION 76%
INTRAVENOUS | Status: AC
  Filled 2016-12-06: qty 100

## 2016-12-06 MED ORDER — LIDOCAINE HCL (PF) 1 % IJ SOLN
INTRAMUSCULAR | Status: DC | PRN
  Administered 2016-12-06: 5 mL

## 2016-12-06 MED ORDER — HEPARIN SODIUM (PORCINE) 1000 UNIT/ML IJ SOLN
INTRAMUSCULAR | Status: DC | PRN
  Administered 2016-12-06: 4000 [IU] via INTRAVENOUS

## 2016-12-06 MED ORDER — HEPARIN (PORCINE) IN NACL 2-0.9 UNIT/ML-% IJ SOLN
INTRAMUSCULAR | Status: AC
  Filled 2016-12-06: qty 1000

## 2016-12-06 MED ORDER — SODIUM CHLORIDE 0.9% FLUSH: 3.0000 mL | INTRAVENOUS | Status: DC | PRN

## 2016-12-06 MED ORDER — IOPAMIDOL (ISOVUE-370) INJECTION 76%
INTRAVENOUS | Status: DC | PRN
  Administered 2016-12-06: 80 mL via INTRA_ARTERIAL

## 2016-12-06 MED ORDER — ACETAMINOPHEN 325 MG PO TABS: 650.0000 mg | ORAL_TABLET | ORAL | Status: DC | PRN

## 2016-12-06 MED ORDER — NITROGLYCERIN 1 MG/10 ML FOR IR/CATH LAB
INTRA_ARTERIAL | Status: AC
  Filled 2016-12-06: qty 10

## 2016-12-06 MED ORDER — MIDAZOLAM HCL 2 MG/2ML IJ SOLN
INTRAMUSCULAR | Status: DC | PRN
  Administered 2016-12-06: 2 mg via INTRAVENOUS

## 2016-12-06 MED ORDER — VERAPAMIL HCL 2.5 MG/ML IV SOLN
INTRAVENOUS | Status: DC | PRN
  Administered 2016-12-06: 10:00:00 via INTRA_ARTERIAL

## 2016-12-06 MED ORDER — NITROGLYCERIN 1 MG/10 ML FOR IR/CATH LAB
INTRA_ARTERIAL | Status: DC | PRN
  Administered 2016-12-06: 200 ug via INTRACORONARY

## 2016-12-06 MED ORDER — SODIUM CHLORIDE 0.9 % IV SOLN: INTRAVENOUS | Status: DC

## 2016-12-06 MED ORDER — LABETALOL HCL 5 MG/ML IV SOLN
INTRAVENOUS | Status: DC | PRN
Start: 2016-12-06 — End: 2016-12-06
  Administered 2016-12-06: 10 mg via INTRAVENOUS

## 2016-12-06 MED ORDER — VERAPAMIL HCL 2.5 MG/ML IV SOLN
INTRAVENOUS | Status: AC
  Filled 2016-12-06: qty 2

## 2016-12-06 SURGICAL SUPPLY — 15 items
CATH BALLN WEDGE 5F 110CM (CATHETERS) ×2 IMPLANT
CATH INFINITI 5 FR JL3.5 (CATHETERS) ×2 IMPLANT
CATH INFINITI JR4 5F (CATHETERS) ×2 IMPLANT
COVER PRB 48X5XTLSCP FOLD TPE (BAG) ×1 IMPLANT
COVER PROBE 5X48 (BAG) ×1
DEVICE RAD COMP TR BAND LRG (VASCULAR PRODUCTS) ×2 IMPLANT
GLIDESHEATH SLEND SS 6F .021 (SHEATH) ×2 IMPLANT
GUIDEWIRE INQWIRE 1.5J.035X260 (WIRE) ×1 IMPLANT
INQWIRE 1.5J .035X260CM (WIRE) ×2
KIT HEART LEFT (KITS) ×2 IMPLANT
PACK CARDIAC CATHETERIZATION (CUSTOM PROCEDURE TRAY) ×2 IMPLANT
SHEATH GLIDE SLENDER 4/5FR (SHEATH) ×2 IMPLANT
TRANSDUCER W/STOPCOCK (MISCELLANEOUS) ×2 IMPLANT
TUBING CIL FLEX 10 FLL-RA (TUBING) ×2 IMPLANT
WIRE EMERALD ST .035X150CM (WIRE) ×2 IMPLANT

## 2016-12-06 NOTE — H&P (View-Only) (Signed)
PCP: Harrison Mons, PA-C  Clinic Note: Chief Complaint  Patient presents with  . New Patient (Initial Visit)    here aortic stenosis    HPI: Alexandra [name] is a 66 y.o. female with a PMH below who presents today for Cardiology evaluation and diagnosis of aortic stenosis. She is Referred at the request of.of Prescott Gum, Collier Salina, MD. She is a long-term smoker (down to 1-2/day)  with history of hypertension and hyperlipidemia who was seen by Dr. Prescott Gum for evaluation of aortic stenosis on August 15.  Alexandra [name] had been seen Dr. Prescott Gum for her abnormal CT of the chest (pulmonary nodule) and there was concern for murmur with thoracic aortic calcification and aortic valve calcification leading to an echocardiogram. Dr. Prescott Gum is now referred her to cardiology for right and left heart catheterization.  Recent Hospitalizations: none.  Studies Personally Reviewed - (if available, images/films reviewed: From Epic Chart or Care Everywhere)  Transthoracic Echocardiogram October 30, 2008: Month since he LVH. EF 55-60%. No RWMA, Gr 2 DD. Mod-Severe AS (mean-peak Gradient 31 mmHG - 64 mmHg), Mod MR. Mod LA dilation, Mod PA HTN - peak pressure ~54 mmHg).  Indicates progression of disease from 2014 (mod AS)  CT of chest July 2015: Thoracic aortic calcification without dilatation of the thoracic aorta. Coronary artery calcification also noted along with aortic valve calcification. Pulmonary arteries within normal limits. Mild emphysematous changes with mild left lower lobe scarring.  Aortic Arch Angoigraphy May 2016: for non-healing finger wound              #1  Type 1 aortic arch               #3  no significant stenosis, aneurysmal degeneration, or dissection, or luminal irregularity is seen within the right subclavian, axillary, brachial, or radial artery.  The ulnar artery appears occluded in the distal forearm.  This appears to be a chronic process  Interval History: Alexandra [name] presents here  today for cardiology evaluation, a bit scared because of her family history of coronary disease. Her brother recently died of an MI at age 55. She notes that she has been persistently short of breath for years (which she is attributed to her lung disease from smoking). She says mostly she feels short of breath in the morning and oftentimes will use her inhaler to help it. She denies any orthopnea symptoms but she does occasionally wake up and sit up to catch her breath. Pretty much these symptoms shortness of breath and orthopnea PND have been occurring since she had pneumonia back in 2012. She sleeps on at least 2 pillows ever since. Which she notes is that sometimes in the night and sometimes during the day when she is not doing anything she'll feel her heart pounding/beating fast for a few minutes at a time and she'll feel notably short of breath with this. These episodes don't last more than a minute or 2, but they are very concerning to her. She denies any chest discomfort except for these pounding heartbeats. No exertional chest discomfort. No significant edema. She had an episode back in February where she blacked out and is not sure what happened. She's had occasional episodes where she's felt similar to how she did at that time but did not actually pass out. That has not been occurring over the last month or so however. No TIA or amaurosis fugax symptoms.  No claudication - although I did not ask this question  in extensive detail..  ROS: A comprehensive was performed. Review of Systems  Constitutional: Positive for malaise/fatigue.  HENT: Negative for congestion and nosebleeds.   Respiratory: Positive for cough (Mostly in the mornings), shortness of breath and wheezing (Mostly in the mornings).   Cardiovascular: Positive for palpitations.  Gastrointestinal: Positive for heartburn (Depending what she eats). Negative for abdominal pain, blood in stool, melena and vomiting.  Musculoskeletal:  Negative for falls and joint pain.  Neurological: Positive for dizziness. Negative for seizures and loss of consciousness (None since February).  Psychiatric/Behavioral: Negative for depression and memory loss. The patient has insomnia (Has a hard time getting to sleep). The patient is not nervous/anxious.   All other systems reviewed and are negative.  I have reviewed and (if needed) personally updated the patient's problem list, medications, allergies, past medical and surgical history, social and family history.   Past Medical History:  Diagnosis Date  . GERD (gastroesophageal reflux disease)   . Heart murmur   . Hx of adenomatous colonic polyps 10/19/2015  . Hyperlipidemia   . Hypertension     Past Surgical History:  Procedure Laterality Date  . COLONOSCOPY  2003, 2017  . FRACTURE SURGERY     2007 -right tib /fib fracture ----07/2009 right femur fx after a fall  . FRACTURE SURGERY Right 2011   Femur  . PERIPHERAL VASCULAR CATHETERIZATION N/A 08/09/2014   Procedure: Aortic Arch Angiography;  Surgeon: Serafina Mitchell, MD;  Location: [facility];  Service: Cardiovascular;  Laterality: N/A;  . PERIPHERAL VASCULAR CATHETERIZATION Right 08/09/2014   Procedure: Upper Extremity Angiography;  Surgeon: Serafina Mitchell, MD;  Location: [facility];  Service: Cardiovascular;  Laterality: Right;  . ULTRASOUND GUIDANCE FOR VASCULAR ACCESS  08/09/2014   Procedure: Ultrasound Guidance For Vascular Access;  Surgeon: Serafina Mitchell, MD;  Location: [facility];  Service: Cardiovascular;;    Current Meds  Medication Sig  . amitriptyline (ELAVIL) 25 MG tablet Take 0.5-2 tablets (12.5-50 mg total) by mouth at bedtime.  [name] atorvastatin (LIPITOR) 20 MG tablet take 1 tablet by mouth every evening  . Azelastine HCl 0.15 % SOLN Place 2 sprays into both nostrils 2 (two) times daily.  . bisoprolol-hydrochlorothiazide (ZIAC) 10-6.25 MG tablet take 1 tablet by mouth once daily  . fluticasone  (FLOVENT HFA) 220 MCG/ACT inhaler Inhale 2 puffs into the lungs 2 (two) times daily.  [name] losartan (COZAAR) 50 MG tablet take 1 tablet by mouth once daily  . oxybutynin (DITROPAN-XL) 10 MG 24 hr tablet Take 1 tablet (10 mg total) by mouth at bedtime.  . OxyCODONE ER (XTAMPZA ER) 9 MG C12A Take 9 mg by mouth every 12 (twelve) hours.   [name] oxyCODONE-acetaminophen (PERCOCET) 10-325 MG tablet every 4 (four) hours as needed.   . Vitamin D, Ergocalciferol, (DRISDOL) 50000 units CAPS capsule Take 1 capsule (50,000 Units total) by mouth every 7 (seven) days.    Allergies  Allergen Reactions  . Codeine Nausea Only  . Symproic [Naldemedine] Nausea And Vomiting    Social History   Social History  . Marital status: Married    Spouse name: [name]  . Number of children: 3  . Years of education: 12th grade   Occupational History  . retired Aeronautical engineer     previously self-employed, now relies on [employer]   Social History Main Topics  . Smoking status: Light Tobacco Smoker    Years: 20.00    Types: Cigarettes  . Smokeless  tobacco: Never Used     Comment: has quit before- smokes 2 cigs a day   . Alcohol use No  . Drug use: No     Comment: +cocaine in urine 12/19/2014  . Sexual activity: Yes    Partners: Male   Other Topics Concern  . None   Social History Narrative   Lives with her husband and her middle son.   Her two older sons are products of a previous relationship.   Her youngest son is from her current marriage.   Oldest and youngest sons live nearby.   She has total of 3 children and 6 grandchildren with 2 great-grandchildren.   She does not exercise because she has pain in her right leg.   Family History family history includes Heart attack in her brother; Heart disease in her father and mother; Hypertension in her mother.  Wt Readings from Last 3 Encounters:  11/28/16 154 lb (69.9 kg)  11/06/16 150 lb (68 kg)  10/23/16 150 lb (68 kg)    PHYSICAL  EXAM BP (!) 150/88   Pulse 63   Ht 5\' 5"  (1.651 m)   Wt 154 lb (69.9 kg)   BMI 25.63 kg/m  Physical Exam  Constitutional: She is oriented to person, place, and time. She appears well-developed and well-nourished. No distress.  Well-groomed.  HENT:  Head: Normocephalic and atraumatic.  Mouth/Throat: Oropharynx is clear and moist. No oropharyngeal exudate.  Eyes: Pupils are equal, round, and reactive to light. Conjunctivae and EOM are normal. No scleral icterus.  Neck: Normal range of motion. Neck supple. No hepatojugular reflux and no JVD present. Carotid bruit is not present.  Cardiovascular: Normal rate, regular rhythm and intact distal pulses.  Exam reveals gallop (Cannot exclude gallop. Difficult to hear. Some murmur).   Murmur heard.  Harsh crescendo-decrescendo mid to late systolic murmur is present with a grade of 3/6  at the upper right sternal border, upper left sternal border radiating to the neck Pulmonary/Chest: Effort normal and breath sounds normal. No respiratory distress.  Baseline mild accessory muscle use, nonlabored. Mild diffuse interstitial sounds, but no wheezes rales or rhonchi  Abdominal: Soft. Bowel sounds are normal. She exhibits no distension. There is no tenderness. There is no rebound.  Musculoskeletal: Normal range of motion. She exhibits no edema or deformity.  Neurological: She is alert and oriented to person, place, and time. No cranial nerve deficit.  Skin: Skin is warm and dry. No rash noted. No erythema.  She has thick leathery smoker skin  Psychiatric: She has a normal mood and affect. Her behavior is normal. Judgment and thought content normal.  Somewhat subdued and flat affect. Normal mood  Nursing note and vitals reviewed.    Adult ECG Report  Rate: 63 ;  Rhythm: normal sinus rhythm and Left atrial enlargement. Left bundle branch block with repolarization changes.;   Narrative Interpretation: Stable EKG   Other studies Reviewed: Additional  studies/ records that were reviewed today include:  Recent Labs:   Lab Results  Component Value Date   CREATININE 0.73 11/28/2016   BUN 10 11/28/2016   NA 144 11/28/2016   K 4.6 11/28/2016   CL 103 11/28/2016   CO2 27 11/28/2016   Lab Results  Component Value Date   CHOL 137 07/30/2016   HDL 44 07/30/2016   LDLCALC 75 07/30/2016   TRIG 89 07/30/2016   CHOLHDL 3.1 07/30/2016   ASSESSMENT / PLAN: Problem List Items Addressed This Visit    Aortic  stenosis with mitral and aortic insufficiency - Primary (Chronic)    Moderate to severe if not severe aortic stenosis by echocardiogram in a patient who has had an episode of syncope and has had near syncope with palpitations. She also is having intermittent chest discomfort and dyspnea. I'm concerned that she may have worse aortic stenosis th is suggested by the echocardiogram. I agree with Dr. Prescott Gum assessment in this regard. Plan: Right and left heart catheterization to evaluate for coronary ischemia as well as significance of aortic valve.  Performing MD:  Glenetta Hew, M.D., M.S.  Procedure:  Right and Left Heart Catheterization With Native Coronary Angiography  The procedure with Risks/Benefits/Alternatives and Indications was reviewed with the patient.  All questions were answered.    Risks / Complications include, but not limited to: Death, MI, CVA/TIA, VF/VT (with defibrillation), Bradycardia (need for temporary pacer placement), contrast induced nephropathy, bleeding / bruising / hematoma / pseudoaneurysm, vascular or coronary injury (with possible emergent CT or Vascular Surgery), adverse medication reactions, infection.  Additional risks involving the use of radiation with the possibility of radiation burns and cancer were explained in detail.  The patient voiced understanding and agree to proceed.         Relevant Orders   EKG 12-Lead (Completed)   LEFT AND RIGHT HEART CATHETERIZATION WITH CORONARY ANGIOGRAM   Chest  pain with moderate risk for cardiac etiology    She is having intermittent spells of chest discomfort which are probably more related to palpitations, with coronary calcification and aortic stenosis, we'll skip a nuclear stress test and go for coronary angiography for complete evaluation.      Relevant Orders   EKG 12-Lead (Completed)   Basic metabolic panel (Completed)   CBC (Completed)   LEFT AND RIGHT HEART CATHETERIZATION WITH CORONARY ANGIOGRAM   Coronary artery calcification seen on CAT scan (Chronic)    Chronic calcification noted on CT scan in a patient with aortic stenosis. With her having some significant palpitation episodes and dizziness, dyspnea as well as a syncopal episode, I'm concerned that she has had a decline in function and may be Close to Requiring Aortic Valve Surgery. Plan: Evaluate Coronary Anatomy with Left and Right Heart Catheterization.  Continue statin for risk factor modification.  She is on a beta blocker already along with ARB.       Relevant Orders   EKG 12-Lead (Completed)   LEFT AND RIGHT HEART CATHETERIZATION WITH CORONARY ANGIOGRAM   DOE (dyspnea on exertion) (Chronic)    Probably multifactorial from COPD, but can also not exclude aortic stenosis mediated dyspnea or coronary disease media dyspnea. She has pulmonary hypertension noted on her echocardiogram which would be nice to evaluate in detail. Plan: Right and left heart cath      Relevant Orders   EKG 12-Lead (Completed)   Basic metabolic panel (Completed)   CBC (Completed)   Protime-INR (Completed)   LEFT AND RIGHT HEART CATHETERIZATION WITH CORONARY ANGIOGRAM   Essential hypertension (Chronic)    Blood pressure is high today. She seems quite anxious, and indicates that her blood pressure is not usually this high. We will assess pressures in the heart cath lab. We still have room to titrate up losartan if necessary.      Hyperlipidemia (Chronic)    Relatively well-controlled lipids at  this point on current dose of statin.  At present she is probably within goal of a 3 C3 sure that she has notable coronary disease.  Palpitations (Chronic)    She is on beta blocker, if symptoms are persistent following her catheterization would consider either 48 hour or 2 week event monitor. Main interest would be to exclude arrhythmias.      Relevant Orders   EKG 12-Lead (Completed)   Basic metabolic panel (Completed)   CBC (Completed)   LEFT AND RIGHT HEART CATHETERIZATION WITH CORONARY ANGIOGRAM   Smoker    She continues to push toward quitting smoking is down to just a few cigarettes in the course of the day.       Other Visit Diagnoses    Pre-op testing       Relevant Orders   Basic metabolic panel (Completed)   CBC (Completed)   Protime-INR (Completed)   Clotting disorder ([place])       Relevant Orders   Protime-INR (Completed)      Current medicines are reviewed at length with the patient today. (+/- concerns) n/a The following changes have been made: no changes   Patient Instructions    [facility] [address] [place] [address] Dept: [phone] Loc: [place]  11/28/2016  You are scheduled for a Cardiac Catheterization on Friday, September 14 with Dr. Glenetta Hew.  1. Please arrive at the [facility] (Main Entrance A) at [facility]: [address] [place], [place] [address] at 8:00 AM (two hours before your procedure to ensure your preparation). Free valet parking service is available.   Special note: Every effort is made to have your procedure done on time. Please understand that emergencies sometimes delay scheduled procedures.  2. Diet: Do not eat or drink anything after midnight prior to your procedure except sips of water to take medications.  3. Labs: You will need to have blood drawn on Thursday, September 6 at [place] [address], [place]  Open: 8am - 4pm (Lunch 12:30 - 1:30) . You do not need to be fasting.  4. Medication instructions in preparation for your procedure:    Stop taking, LOSARTAN on Friday, September 14.      On the morning of your procedure, take your Aspirin 81 mg and any morning medicines NOT listed above.  You may use sips of water.  5. Plan for one night stay--bring personal belongings. 6. Bring a current list of your medications and current insurance cards. 7. You MUST have a responsible person to drive you home. 8. Someone MUST be with you the first 24 hours after you arrive home or your discharge will be delayed. 9. Please wear clothes that are easy to get on and off and wear slip-on shoes.  Thank you for allowing [place] to care for you!   --  Invasive Cardiovascular services     Your physician recommends that you schedule a follow-up appointment in 3 weeks with Dr Ellyn Hack.   Studies Ordered:   Orders Placed This Encounter  Procedures  . Basic metabolic panel  . CBC  . Protime-INR  . EKG 12-Lead  . LEFT AND RIGHT HEART CATHETERIZATION WITH CORONARY Illene Silver, M.D., M.S. Interventional Cardiologist   Pager # [phone] Phone # [phone] [address] [place], [place] [address]

## 2016-12-06 NOTE — Discharge Instructions (Signed)

## 2016-12-06 NOTE — Interval H&P Note (Signed)
History and Physical Interval Note:  12/06/2016 9:41 AM  Alexandra [name]  has presented today for surgery, with the diagnosis of aortic stenosis, cp, sob, Coronary calcification   The various methods of treatment have been discussed with the patient and family. After consideration of risks, benefits and other options for treatment, the patient has consented to  Procedure(s): RIGHT/LEFT HEART CATH AND CORONARY ANGIOGRAPHY (N/A) as a surgical intervention .  The patient's history has been reviewed, patient examined, no change in status, stable for surgery.  I have reviewed the patient's chart and labs.  Questions were answered to the patient's satisfaction.     Glenetta Hew

## 2016-12-09 ENCOUNTER — Encounter (HOSPITAL_COMMUNITY): Payer: Self-pay | Admitting: Cardiology

## 2016-12-19 ENCOUNTER — Ambulatory Visit: Payer: Medicare Other

## 2016-12-24 ENCOUNTER — Encounter: Payer: Self-pay | Admitting: Physician Assistant

## 2016-12-24 ENCOUNTER — Ambulatory Visit (INDEPENDENT_AMBULATORY_CARE_PROVIDER_SITE_OTHER): Payer: Medicare Other | Admitting: Physician Assistant

## 2016-12-24 VITALS — BP 118/80 | HR 63 | Resp 16 | Ht 65.5 in | Wt 150.4 lb

## 2016-12-24 DIAGNOSIS — Z23 Encounter for immunization: Secondary | ICD-10-CM | POA: Diagnosis not present

## 2016-12-24 DIAGNOSIS — I2510 Atherosclerotic heart disease of native coronary artery without angina pectoris: Secondary | ICD-10-CM | POA: Diagnosis not present

## 2016-12-24 DIAGNOSIS — J449 Chronic obstructive pulmonary disease, unspecified: Secondary | ICD-10-CM

## 2016-12-24 DIAGNOSIS — E559 Vitamin D deficiency, unspecified: Secondary | ICD-10-CM | POA: Diagnosis not present

## 2016-12-24 DIAGNOSIS — R911 Solitary pulmonary nodule: Secondary | ICD-10-CM | POA: Diagnosis not present

## 2016-12-24 DIAGNOSIS — F411 Generalized anxiety disorder: Secondary | ICD-10-CM | POA: Diagnosis not present

## 2016-12-24 MED ORDER — MIRTAZAPINE 15 MG PO TABS: 15.0000 mg | ORAL_TABLET | Freq: Every day | ORAL | 3 refills | Status: DC

## 2016-12-24 NOTE — Patient Instructions (Addendum)
     IF you received an x-ray today, you will receive an invoice from [facility]. Please contact [facility] at [phone] with questions or concerns regarding your invoice.   IF you received labwork today, you will receive an invoice from [facility]. Please contact [facility] at [phone] with questions or concerns regarding your invoice.   Our billing staff will not be able to assist you with questions regarding bills from these companies.  You will be contacted with the lab results as soon as they are available. The fastest way to get your results is to activate your My Chart account. Instructions are located on the last page of this paperwork. If you have not heard from [place] regarding the results in 2 weeks, please contact this office.    We recommend that you schedule a mammogram for breast cancer screening. Typically, you do not need a referral to do this. Please contact a local imaging center to schedule your mammogram.  [facility] - [phone]  *ask for the Radiology Department [facility] ([place]) - [phone] or [phone]  [facility] - [phone] [place] [phone] [facility] - [phone]  *ask for the [facility] - [phone]  *ask for the Radiology Department [facility] - [phone]  *ask for the [facility] - [phone]    Did you know that you begin to benefit from quitting smoking within the first twenty minutes? It's TRUE.  At 20 minutes: -blood pressure decreases -pulse rate drops -body temperature of hands and feet increases  At 8 hours: -carbon monoxide level in blood drops to normal -oxygen level in blood increases to normal  At 24 hours: -the chance of heart attack decreases  At 48 hours: -nerve endings start regrowing -ability to smell and taste is  enhanced  2 weeks-3 months: -circulation improves -walking becomes easier -lung function improves  1-9 months: -coughing, sinus congestion, fatigue and shortness of breath decreases  1 year: -excess risk of heart disease is decreased to HALF that of a smoker  5 years: Stroke risk is reduced to that of people who have never smoked  10 years: -risk of lung cancer drops to as little as half that of continuing smokers -risk of cancer of the mouth, throat, esophagus, bladder, kidney and pancreas decreases -risk of ulcer decreases  15 years -risk of heart disease is now similar to that of people who have never smoked -risk of death returns to nearly the level of people who have never smoked

## 2016-12-24 NOTE — Progress Notes (Signed)
Patient ID: Alexandra [name], female    DOB: Jan 15, 1951, 66 y.o.   MRN: [id number]  PCP: Harrison Mons, PA-C  Chief Complaint  Patient presents with  . Insomnia    patient present to office to discuss insomnia. Patient states that medication did not help her, she has stopped taking it.  . Follow-up    Subjective:   Presents for evaluation of insomnia, not remedied by trazodone.  After two weeks without benefit, she stopped it. Initally, she related the insomnia to anxiety over a pulmonary nodule. That nodule was followed by CT and has resolved, but she is now being evaluated for aortic stenosis. Cardiac catheterization on 12/06/2016 Tolerating increased losartan dose. Follow-up with cardiology is later this week.  Only sleeps 2-3 hours/night. Has cut down to 2 cigarettes/day.  Review of Systems Constitutional: Negative for chills and fever.  HENT: Negative for congestion, ear pain and rhinorrhea.   Respiratory: Negative for cough and shortness of breath.   Cardiovascular: Positive for palpitations. Negative for chest pain.  Gastrointestinal: Negative for abdominal pain.     Patient Active Problem List   Diagnosis Date Noted  . Coronary artery calcification seen on CAT scan 11/28/2016  . DOE (dyspnea on exertion) 11/28/2016  . Chest pain with moderate risk for cardiac etiology 11/28/2016  . Smoker 06/25/2016  . COPD (chronic obstructive pulmonary disease) ([place]) 05/22/2016  . Urinary frequency 02/20/2016  . Right knee DJD 12/09/2015  . Hx of adenomatous colonic polyps 10/19/2015  . Osteopenia 08/15/2015  . Femoral neck fracture ([place]) 07/16/2015  . Chronic pain syndrome 07/16/2015  . Primary localized osteoarthrosis of pelvic region 07/16/2015  . Anxiety state 07/16/2015  . Palpitations 07/16/2015  . Vitamin D deficiency 05/16/2015  . Aortic stenosis with mitral and aortic insufficiency 07/29/2014  . Essential hypertension 07/29/2014  . Hyperlipidemia 07/29/2014       Prior to Admission medications   Medication Sig Start Date End Date Taking? Authorizing Provider  aspirin 81 MG tablet Chew 81 mg by mouth as needed (palpitations).   Yes [provider]  atorvastatin (LIPITOR) 20 MG tablet take 1 tablet by mouth every evening Patient taking differently: take 20mg  by mouth every evening 10/31/16  Yes Tristian Bouska, PA-C  Azelastine HCl 0.15 % SOLN Place 2 sprays into both nostrils 2 (two) times daily. Patient taking differently: Place 2 sprays into both nostrils 2 (two) times daily as needed (allergies).  03/26/16  Yes Harrison Mons, PA-C  bisoprolol-hydrochlorothiazide [facility]) 10-6.25 MG tablet take 1 tablet by mouth once daily 10/24/16  Yes Aarthi Uyeno, PA-C  fluticasone (FLOVENT HFA) 220 MCG/ACT inhaler Inhale 2 puffs into the lungs 2 (two) times daily. 07/30/16  Yes Susan Bleich, PA-C  losartan (COZAAR) 100 MG tablet Take 1 tablet (100 mg total) by mouth daily. Take existing 50mg  tabs 2 tabs daily until current Rx complete. 12/06/16  Yes Leonie Man, MD  oxybutynin (DITROPAN-XL) 10 MG 24 hr tablet Take 1 tablet (10 mg total) by mouth at bedtime. 02/20/16  Yes Altie Savard, PA-C  OxyCODONE ER (XTAMPZA ER) 9 MG C12A Take 9 mg by mouth every 12 (twelve) hours.    Yes [provider]  oxyCODONE-acetaminophen (PERCOCET) 10-325 MG tablet Take 1 tablet by mouth every 4 (four) hours as needed for pain.  02/05/16  Yes [provider]  Vitamin D, Ergocalciferol, (DRISDOL) 50000 units CAPS capsule Take 1 capsule (50,000 Units total) by mouth every 7 (seven) days. Patient taking differently: Take 50,000  Units by mouth every Sunday.  09/19/16  Yes Harrison Mons, PA-C     Allergies  Allergen Reactions  . Codeine Nausea Only  . Symproic [Naldemedine] Nausea And Vomiting       Objective:  Physical Exam  Constitutional: She is oriented to person, place, and time. She appears well-developed and well-nourished. She is active  and cooperative. No distress.  BP 118/80 (BP Location: Left Arm, Patient Position: Sitting, Cuff Size: Normal)   Pulse 63   Resp 16   Ht 5' 5.5" (1.664 m)   Wt 150 lb 6.4 oz (68.2 kg)   SpO2 97%   BMI 24.65 kg/m   HENT:  Head: Normocephalic and atraumatic.  Right Ear: Hearing normal.  Left Ear: Hearing normal.  Eyes: Conjunctivae are normal. No scleral icterus.  Neck: Normal range of motion. Neck supple. No thyromegaly present.  Cardiovascular: Normal rate and regular rhythm.   Murmur (2/6) heard. Pulses:      Radial pulses are 2+ on the right side, and 2+ on the left side.  Pulmonary/Chest: Effort normal and breath sounds normal.  Lymphadenopathy:       Head (right side): No tonsillar, no preauricular, no posterior auricular and no occipital adenopathy present.       Head (left side): No tonsillar, no preauricular, no posterior auricular and no occipital adenopathy present.    She has no cervical adenopathy.       Right: No supraclavicular adenopathy present.       Left: No supraclavicular adenopathy present.  Neurological: She is alert and oriented to person, place, and time. No sensory deficit.  Skin: Skin is warm, dry and intact. No rash noted. No cyanosis or erythema. Nails show no clubbing.  Psychiatric: She has a normal mood and affect. Her speech is normal and behavior is normal.           Assessment & Plan:   Problem List Items Addressed This Visit    Vitamin D deficiency   Relevant Orders   VITAMIN D 25 Hydroxy (Vit-D Deficiency, Fractures) (Completed)   Anxiety state - Primary    Trial of mirtazepine.      Relevant Medications   mirtazapine (REMERON) 15 MG tablet   COPD (chronic obstructive pulmonary disease) ([place])    Encouraged continued efforts to quit smoking.      RESOLVED: Nodule of right lung    Other Visit Diagnoses    Need for pneumococcal vaccination       Relevant Orders   Pneumococcal polysaccharide vaccine 23-valent greater than or equal  to 2yo subcutaneous/IM (Completed)       Return in about 4 months (around 04/26/2017) for re-evaluation of COPD, etc.   Fara Chute, PA-C [facility] [place]

## 2016-12-24 NOTE — Progress Notes (Signed)
Subjective:    Patient ID: Alexandra [name], female    DOB: 07/31/1950, 66 y.o.   MRN: [id number]  HPI Chief Complaint  Patient presents with  . Insomnia    patient present to office to discuss insomnia. Patient states that medication did not help her, she has stopped taking it.  . Follow-up   Patient reports that she discontinued taking Trazadone after taking it for two weeks. She reports sleeping 2-3 hours a night. She denies paroxysmal nocturnal dyspnea but does sleep with 2 pillows at night. Patient states she is tired during the day.   She smokes 2 cigarettes a day, one in the morning and one at night.   Review of Systems  Constitutional: Negative for chills and fever.  HENT: Negative for congestion, ear pain and rhinorrhea.   Respiratory: Negative for cough and shortness of breath.   Cardiovascular: Positive for palpitations. Negative for chest pain.  Gastrointestinal: Negative for abdominal pain.   Patient Active Problem List   Diagnosis Date Noted  . Coronary artery calcification seen on CAT scan 11/28/2016  . DOE (dyspnea on exertion) 11/28/2016  . Chest pain with moderate risk for cardiac etiology 11/28/2016  . Smoker 06/25/2016  . COPD (chronic obstructive pulmonary disease) ([place]) 05/22/2016  . Urinary frequency 02/20/2016  . Right knee DJD 12/09/2015  . Hx of adenomatous colonic polyps 10/19/2015  . Osteopenia 08/15/2015  . Femoral neck fracture ([place]) 07/16/2015  . Chronic pain syndrome 07/16/2015  . Primary localized osteoarthrosis of pelvic region 07/16/2015  . Anxiety state 07/16/2015  . Palpitations 07/16/2015  . Vitamin D deficiency 05/16/2015  . Aortic stenosis with mitral and aortic insufficiency 07/29/2014  . Essential hypertension 07/29/2014  . Hyperlipidemia 07/29/2014   Prior to Admission medications   Medication Sig Start Date End Date Taking? Authorizing Provider  aspirin 81 MG tablet Chew 81 mg by mouth as needed (palpitations).   Yes [provider]  atorvastatin (LIPITOR) 20 MG tablet take 1 tablet by mouth every evening Patient taking differently: take 20mg  by mouth every evening 10/31/16  Yes Jeffery, Chelle, PA-C  Azelastine HCl 0.15 % SOLN Place 2 sprays into both nostrils 2 (two) times daily. Patient taking differently: Place 2 sprays into both nostrils 2 (two) times daily as needed (allergies).  03/26/16  Yes Harrison Mons, PA-C  bisoprolol-hydrochlorothiazide [facility]) 10-6.25 MG tablet take 1 tablet by mouth once daily 10/24/16  Yes Jeffery, Chelle, PA-C  fluticasone (FLOVENT HFA) 220 MCG/ACT inhaler Inhale 2 puffs into the lungs 2 (two) times daily. 07/30/16  Yes Jeffery, Chelle, PA-C  losartan (COZAAR) 100 MG tablet Take 1 tablet (100 mg total) by mouth daily. Take existing 50mg  tabs 2 tabs daily until current Rx complete. 12/06/16  Yes Leonie Man, MD  oxybutynin (DITROPAN-XL) 10 MG 24 hr tablet Take 1 tablet (10 mg total) by mouth at bedtime. 02/20/16  Yes Jeffery, Chelle, PA-C  OxyCODONE ER (XTAMPZA ER) 9 MG C12A Take 9 mg by mouth every 12 (twelve) hours.    Yes [provider]  oxyCODONE-acetaminophen (PERCOCET) 10-325 MG tablet Take 1 tablet by mouth every 4 (four) hours as needed for pain.  02/05/16  Yes [provider]  Vitamin D, Ergocalciferol, (DRISDOL) 50000 units CAPS capsule Take 1 capsule (50,000 Units total) by mouth every 7 (seven) days. Patient taking differently: Take 50,000 Units by mouth every Sunday.  09/19/16  Yes Harrison Mons, PA-C   Allergies  Allergen Reactions  . Codeine Nausea Only  .  Symproic [Naldemedine] Nausea And Vomiting   Social History   Social History  . Marital status: Married    Spouse name: [name]  . Number of children: 3  . Years of education: 12th grade   Occupational History  . retired Aeronautical engineer     previously self-employed, now relies on [employer]   Social History Main Topics  . Smoking status: Light Tobacco Smoker    Years:  20.00    Types: Cigarettes  . Smokeless tobacco: Never Used     Comment: has quit before- smokes 2 cigs a day   . Alcohol use No  . Drug use: No     Comment: +cocaine in urine 12/19/2014  . Sexual activity: Yes    Partners: Male   Other Topics Concern  . Not on file   Social History Narrative   Lives with her husband and her middle son.   Her two older sons are products of a previous relationship.   Her youngest son is from her current marriage.   Oldest and youngest sons live nearby.   She has total of 3 children and 6 grandchildren with 2 great-grandchildren.   She does not exercise because she has pain in her right leg.      Objective:   Physical Exam  Constitutional: She is oriented to person, place, and time. She appears well-developed and well-nourished. No distress.  HENT:  Head: Normocephalic and atraumatic.  Right Ear: External ear normal.  Left Ear: External ear normal.  Neck: Neck supple. No JVD present. No tracheal deviation present. No thyromegaly present.  Cardiovascular: Normal rate and intact distal pulses.  Exam reveals no gallop and no friction rub.   Murmur (3/6 systolic heard at the RUSB) heard. Pulmonary/Chest: Effort normal and breath sounds normal. No stridor. No respiratory distress. She has no wheezes. She has no rales. She exhibits no tenderness.  Lymphadenopathy:    She has no cervical adenopathy.  Neurological: She is alert and oriented to person, place, and time.  Skin: Skin is warm and dry. No rash noted. She is not diaphoretic. No erythema. No pallor.      Assessment & Plan:  1. Anxiety state - Discontinued Trazadone. Prescribed Mirtazapine, patient will call if it does not help with insomnia.  - mirtazapine (REMERON) 15 MG tablet; Take 1 tablet (15 mg total) by mouth at bedtime.  Dispense: 30 tablet; Refill: 3  2. Nodule of right lung - Resolved on CT scan from 09/2016  3. Chronic obstructive pulmonary disease, unspecified COPD type  ([place]) - Counseled on smoking cessation.   4. Vitamin D deficiency - VITAMIN D 25 Hydroxy (Vit-D Deficiency, Fractures)  5. Need for pneumococcal vaccination - Pneumococcal polysaccharide vaccine 23-valent greater than or equal to 2yo subcutaneous/IM  Follow up in 4 months sooner if needed.  Respectfully, Denny Levy PA-S 2019

## 2016-12-25 DIAGNOSIS — G894 Chronic pain syndrome: Secondary | ICD-10-CM | POA: Diagnosis not present

## 2016-12-25 DIAGNOSIS — M25569 Pain in unspecified knee: Secondary | ICD-10-CM | POA: Diagnosis not present

## 2016-12-25 DIAGNOSIS — M79606 Pain in leg, unspecified: Secondary | ICD-10-CM | POA: Diagnosis not present

## 2016-12-25 DIAGNOSIS — M1990 Unspecified osteoarthritis, unspecified site: Secondary | ICD-10-CM | POA: Diagnosis not present

## 2016-12-25 LAB — VITAMIN D 25 HYDROXY (VIT D DEFICIENCY, FRACTURES): Vit D, 25-Hydroxy: 41.7 ng/mL (ref 30.0–100.0)

## 2016-12-27 ENCOUNTER — Encounter: Payer: Self-pay | Admitting: Cardiology

## 2016-12-27 ENCOUNTER — Encounter: Payer: Self-pay | Admitting: Physician Assistant

## 2016-12-27 ENCOUNTER — Ambulatory Visit (INDEPENDENT_AMBULATORY_CARE_PROVIDER_SITE_OTHER): Payer: Medicare Other | Admitting: Cardiology

## 2016-12-27 VITALS — BP 140/80 | HR 68 | Ht 65.0 in | Wt 151.0 lb

## 2016-12-27 DIAGNOSIS — I080 Rheumatic disorders of both mitral and aortic valves: Secondary | ICD-10-CM

## 2016-12-27 DIAGNOSIS — I10 Essential (primary) hypertension: Secondary | ICD-10-CM

## 2016-12-27 DIAGNOSIS — I2510 Atherosclerotic heart disease of native coronary artery without angina pectoris: Secondary | ICD-10-CM

## 2016-12-27 DIAGNOSIS — I272 Pulmonary hypertension, unspecified: Secondary | ICD-10-CM

## 2016-12-27 DIAGNOSIS — F17200 Nicotine dependence, unspecified, uncomplicated: Secondary | ICD-10-CM | POA: Diagnosis not present

## 2016-12-27 DIAGNOSIS — R002 Palpitations: Secondary | ICD-10-CM | POA: Diagnosis not present

## 2016-12-27 DIAGNOSIS — E785 Hyperlipidemia, unspecified: Secondary | ICD-10-CM

## 2016-12-27 DIAGNOSIS — R0609 Other forms of dyspnea: Secondary | ICD-10-CM | POA: Diagnosis not present

## 2016-12-27 MED ORDER — FUROSEMIDE 20 MG PO TABS: ORAL_TABLET | ORAL | 3 refills | Status: DC

## 2016-12-27 NOTE — Patient Instructions (Signed)
Medication   Start taking furosemide ( lasix) 20 mg take one tablet by mouth every morning, if you have a bad night as you described to Dr Ellyn Hack, you may take an extra 20 mg tablet that day.    SCHEDULE AT [place] has recommended that you wear a holter monitor-48 HOURS. Holter monitors are medical devices that record the heart's electrical activity. Doctors most often use these monitors to diagnose arrhythmias. Arrhythmias are problems with the speed or rhythm of the heartbeat. The monitor is a small, portable device. You can wear one while you do your normal daily activities. This is usually used to diagnose what is causing palpitations/syncope (passing out).    Your physician recommends that you schedule a follow-up appointment in [place] 2018 [place] 2019 WITH DR Russell.

## 2016-12-27 NOTE — Progress Notes (Addendum)
PCP: Harrison Mons, PA-C  Clinic Note: Chief Complaint  Patient presents with  . Follow-up    Post CATH.  Alexandra [name] Aortic Stenosis    HPI: Alexandra [name] is a 66 y.o. female with a PMH below who presents today for Follow-up Cardiology evaluation and diagnosis of aortic stenosis - to follow-up from her recent catheterization.. She is Referred at the request of.of Harrison Mons, PA-C. She is a long-term smoker (down to 1-2/day)  with history of hypertension and hyperlipidemia who was seen by Dr. Prescott Gum for evaluation of aortic stenosis on August 15 --> he then referred her for cardiology evaluation following her echocardiogram.  Alexandra [name] was seen for initial consultation back on September 6. She has a significant family history of CAD and is noticing exertional dyspnea but not necessarily chest pain. No PND or orthopnea, but had noted some irregular heartbeats or palpitations. Because of the concern that her exertional dyspnea may be potentially related to her aortic stenosis being moderate to severe, we decided to proceed with possibly preoperative evaluation with right left heart catheterization she now presents for follow-up  Recent Hospitalizations: For heart cath  Studies Personally Reviewed - (if available, images/films reviewed: From Epic Chart or Care Everywhere)  R&LHC: 65% distal LAD lesion -- likely not angiographically significant->medical management. Normal LVEF.  Moderately elevated LVEDP. I aortic valve gradient in the Cath Lab, moderate aortic stenosis. This would suggest that the stenosis is on the moderate side of moderate to severe.  Severe pulmonary hypertension (likely mixed).  AVA 0.96 cm; P-P gradient ~ 30 mmHg, Mean ~24.5-27.5 (Moderate AS) RHC #s: RAP 8 mmHg,  RVP/EDP: 71/8/15 mmHg, PCWP: 21-24 mmHg, PAP/mean: 71/32/52 mmHg = Severe Mixed Pulmonary HTN; LVP/EDP 205/17/26 mmHg ; Cardiac output/index by Fick: 4.35, 2.44 - mildly reduced  Interval History:  Alexandra [name] presents here today for post-cath f/u  still noting persistent exertional dyspnea that has been going on for years. She has not had any chest tightness or pressure however. What she does notice that her heart rate tends to go up quite a bit at night and she is having some palpitation spells during the daytime as well. But more so at night. Partly because these tachycardia spells, she 7 heart time sleeping through the night. She does notice a little bit of swelling and maybe a mild orthopnea and occasional PND. She also notes occasionally feeling lightheaded and dizzy and can't really tell if these are related to palpitations or not. She is not the greatest of historians.  No further near-syncopal episodes. No resting or exertional chest tightness/pressure.  no TIA or amaurosis fugax symptoms and no active claudication symptoms although she does not walk very much..  ROS: A comprehensive was performed. Review of Systems  Constitutional: Positive for malaise/fatigue.  HENT: Negative for congestion and nosebleeds.   Respiratory: Positive for cough (Mostly in the mornings), shortness of breath and wheezing (Mostly in the mornings).   Cardiovascular: Positive for palpitations.  Gastrointestinal: Positive for heartburn (Depending what she eats). Negative for abdominal pain, blood in stool, melena and vomiting.  Musculoskeletal: Negative for falls and joint pain.  Neurological: Positive for dizziness. Negative for seizures and loss of consciousness (None since February).  Psychiatric/Behavioral: Negative for depression and memory loss. The patient has insomnia (Has a hard time getting to sleep). The patient is not nervous/anxious.   All other systems reviewed and are negative.  I have reviewed and (if needed) personally updated the patient's problem list, medications, allergies,  past medical and surgical history, social and family history.   Past Medical History:  Diagnosis Date  . GERD  (gastroesophageal reflux disease)   . Heart murmur   . Hx of adenomatous colonic polyps 10/19/2015  . Hyperlipidemia   . Hypertension   . Nodule of right lung 07/05/2016   9 mm irregular, possibly spiculated, RIGHT lung base laterally. Rather than repeat CT scan in 3 months, patient elects to consider options for tissue sampling. Resolved on follow-up CT 10/23/2016.    Past Surgical History:  Procedure Laterality Date  . COLONOSCOPY  2003, 2017  . CTA Chest  09/2016   Thoracic Aortic Ca2+ w/o dilation.  Coronary Calcification noted. PA normal. Mild Emphysematous changes w/ mild LLL scarring.    Alexandra [name] FRACTURE SURGERY     2007 -right tib /fib fracture ----07/2009 right femur fx after a fall  . FRACTURE SURGERY Right 2011   Femur  . PERIPHERAL VASCULAR CATHETERIZATION N/A 08/09/2014   Procedure: Aortic Arch Angiography;  Surgeon: Serafina Mitchell, MD;  Location: [facility];  Service: Cardiovascular: Type 1 Arch. No significant stenosis, aneurysmal degeneration or dissection.  No luminal irregularity seen in R or L SubClavian, Brachial or Radial A. Chronic distal ulnar A occlusion [place]a.  Alexandra [name] PERIPHERAL VASCULAR CATHETERIZATION Right 08/09/2014   Procedure: Upper Extremity Angiography;  Surgeon: Serafina Mitchell, MD;  Location: [facility];  Service: Cardiovascular;  Laterality: Right;  . RIGHT/LEFT HEART CATH AND CORONARY ANGIOGRAPHY N/A 12/06/2016   Procedure: RIGHT/LEFT HEART CATH AND CORONARY ANGIOGRAPHY;  Surgeon: Leonie Man, MD;  Location: [facility]: Cor Angio: 65% dLAD (Med Rx). AVA 0.96 cm; P-P gradient ~ 30 mmHg, Mean ~24.5-27.5 (Mod AS); RHC #s: RAP 8 mmHg, RVP/EDP: 71/8/15 mmHg, PCWP: 21-24 mmHg, PAP/mean: 71/32/52 mmHg = Severe Mixed Pulmonary HTN; LVP/EDP 205/17/26 mmHg ; CO/CI by Fick: 4.35, 2.44 - mildly reduced  . TRANSTHORACIC ECHOCARDIOGRAM  10/30/2016   Mild Concentric LVH. EF 55-60%. No RWMA, Gr 2 DD. Mod-Severe AS (mean-peak Gradient 31 mmHG - 64 mmHg),  Mod MR. Mod LA dilation, Mod PA HTN - peak pressure ~54 mmHg). = Progression of AS from 2014  . ULTRASOUND GUIDANCE FOR VASCULAR ACCESS  08/09/2014   Procedure: Ultrasound Guidance For Vascular Access;  Surgeon: Serafina Mitchell, MD;  Location: [facility];  Service: Cardiovascular;;    Current Meds  Medication Sig  . aspirin 81 MG tablet Chew 81 mg by mouth as needed (palpitations).  Alexandra [name] atorvastatin (LIPITOR) 20 MG tablet take 1 tablet by mouth every evening (Patient taking differently: take 20mg  by mouth every evening)  . Azelastine HCl 0.15 % SOLN Place 2 sprays into both nostrils 2 (two) times daily. (Patient taking differently: Place 2 sprays into both nostrils 2 (two) times daily as needed (allergies). )  . bisoprolol-hydrochlorothiazide (ZIAC) 10-6.25 MG tablet take 1 tablet by mouth once daily  . fluticasone (FLOVENT HFA) 220 MCG/ACT inhaler Inhale 2 puffs into the lungs 2 (two) times daily.  Alexandra [name] losartan (COZAAR) 100 MG tablet Take 1 tablet (100 mg total) by mouth daily. Take existing 50mg  tabs 2 tabs daily until current Rx complete.  . mirtazapine (REMERON) 15 MG tablet Take 1 tablet (15 mg total) by mouth at bedtime.  Alexandra [name] oxybutynin (DITROPAN-XL) 10 MG 24 hr tablet Take 1 tablet (10 mg total) by mouth at bedtime.  . OxyCODONE ER (XTAMPZA ER) 9 MG C12A Take 9 mg by mouth every 12 (twelve) hours.   Alexandra [name]  oxyCODONE-acetaminophen (PERCOCET) 10-325 MG tablet Take 1 tablet by mouth every 4 (four) hours as needed for pain.   . Vitamin D, Ergocalciferol, (DRISDOL) 50000 units CAPS capsule Take 1 capsule (50,000 Units total) by mouth every 7 (seven) days. (Patient taking differently: Take 50,000 Units by mouth every Sunday. )    Allergies  Allergen Reactions  . Codeine Nausea Only  . Symproic [Naldemedine] Nausea And Vomiting    Social History   Social History  . Marital status: Married    Spouse name: [name]  . Number of children: 3  . Years of education: 12th grade    Occupational History  . retired Aeronautical engineer     previously self-employed, now relies on [employer]   Social History Main Topics  . Smoking status: Light Tobacco Smoker    Years: 20.00    Types: Cigarettes  . Smokeless tobacco: Never Used     Comment: has quit before- smokes 2 cigs a day   . Alcohol use No  . Drug use: No     Comment: +cocaine in urine 12/19/2014  . Sexual activity: Yes    Partners: Male   Other Topics Concern  . None   Social History Narrative   Lives with her husband and her middle son.   Her two older sons are products of a previous relationship.   Her youngest son is from her current marriage.   Oldest and youngest sons live nearby.   She has total of 3 children and 6 grandchildren with 2 great-grandchildren.   She does not exercise because she has pain in her right leg.   Family History family history includes Heart attack in her brother; Heart disease in her father and mother; Hypertension in her mother.  Wt Readings from Last 3 Encounters:  12/27/16 151 lb (68.5 kg)  12/24/16 150 lb 6.4 oz (68.2 kg)  12/06/16 153 lb (69.4 kg)    PHYSICAL EXAM BP 140/80   Pulse 68   Ht 5\' 5"  (1.651 m)   Wt 151 lb (68.5 kg)   BMI 25.13 kg/m  Physical Exam  Constitutional: She is oriented to person, place, and time. She appears well-developed and well-nourished. No distress.  Well-groomed.  HENT:  Head: Normocephalic and atraumatic.  Mouth/Throat: Oropharynx is clear and moist. No oropharyngeal exudate.  Neck: Normal range of motion. Neck supple. No hepatojugular reflux and no JVD present. Carotid bruit is not present.  Cardiovascular: Normal rate, regular rhythm and intact distal pulses.  Exam reveals gallop (Cannot exclude gallop. Difficult to hear. Some murmur).   Murmur heard.  Harsh crescendo-decrescendo mid to late systolic murmur is present with a grade of 3/6  at the upper right sternal border, upper left sternal border radiating to the  neck Pulmonary/Chest: Effort normal and breath sounds normal. No respiratory distress.  Baseline mild accessory muscle use, nonlabored. Mild diffuse interstitial sounds, but no wheezes rales or rhonchi  Abdominal: Soft. Bowel sounds are normal. She exhibits no distension. There is no tenderness. There is no rebound.  Musculoskeletal: Normal range of motion. She exhibits no edema.  Neurological: She is alert and oriented to person, place, and time.  Skin: Skin is warm and dry. No rash noted. No erythema.  She has thick leathery smoker skin  Psychiatric: She has a normal mood and affect. Her behavior is normal. Judgment and thought content normal.  Somewhat subdued and flat affect. Normal mood  Nursing note and vitals reviewed.    Adult ECG Report  Rate: 63 ;  Rhythm: normal sinus rhythm and Left atrial enlargement. Left bundle branch block with repolarization changes.;   Narrative Interpretation: Stable EKG   Other studies Reviewed: Additional studies/ records that were reviewed today include:  Recent Labs:   Lab Results  Component Value Date   CREATININE 0.73 11/28/2016   BUN 10 11/28/2016   NA 144 11/28/2016   K 4.6 11/28/2016   CL 103 11/28/2016   CO2 27 11/28/2016   Lab Results  Component Value Date   CHOL 137 07/30/2016   HDL 44 07/30/2016   LDLCALC 75 07/30/2016   TRIG 89 07/30/2016   CHOLHDL 3.1 07/30/2016   ASSESSMENT / PLAN:  Keah returns todayQuite relieved with the results of her stress test showing no ischemia CAD. Is also suggestion that the aortic stenosis is not yet at a point that she would need to have surgical repair. She does however have significant pulmonary hypertension that is probably combination of cor pulmonale in addition to elevated LVEDP from combination of hypertension and aortic stenosis.   Certainly the pulmonary hypertension in combination with a baseline COPD are causing her to be dyspneic. She may very well benefit from pulmonary medicine  evaluation with thoughts of potential likely upcoming aortic valve replacement surgery in the next 3 years.  The plan for now will be to treat the baseline hypertension and what appears to be volume overloaded --> Will start low-dose Lasix, and low-intermediate dose diltiazem given her tachycardia spells at night.  I will hold off on starting diltiazem until she has completed wearing a 48 hour monitor.   Problem List Items Addressed This Visit    Aortic stenosis with mitral and aortic insufficiency - Primary (Chronic)     By cardiac catheterization, confirming moderate-severe not severe stenosis.    At this point, not yet at this stage requiring Aortic Valve Replacement.      Relevant Medications   furosemide (LASIX) 20 MG tablet   Coronary artery disease, non-occlusive (Chronic)    Moderate disease noted in the LAD - medical management for now.   Suspect that once her Aortic Valve is stenotic to the point of AVR, would need to be addressed.  Continue Statin for RF modificaiton  Continue Beta Blocker & ARB   Smoking cessation counseling      Relevant Medications   furosemide (LASIX) 20 MG tablet   DOE (dyspnea on exertion) (Chronic)    Multifactorial - COPD with pulmonary HTN in conjunction with AS/HTN related LV Diastolic dysfunction.      Relevant Orders   HOLTER MONITOR - 17 HOUR   Essential hypertension (Chronic)    Borderline blood pressure today. Plan: Add low-dose diuretic and diltiazem. Hold diltiazem until after wearing a 48-hour monitor.      Relevant Medications   furosemide (LASIX) 20 MG tablet   Hyperlipidemia with target low density lipoprotein (LDL) cholesterol less than 70 mg/dL (Chronic)    Goal LDL should now be < 70. For now continue current dose of statin, but if LDL dose not come down the last ~5 points, increase atorvastatin.      Relevant Medications   furosemide (LASIX) 20 MG tablet   Palpitations (Chronic)    Already on beta blocker. Plan have  been 48-hour monitor to evaluate nighttime rapid heartbeats.  Start low-dose diltiazem in addition to beta blocker.      Relevant Orders   HOLTER MONITOR - 76 HOUR   Pulmonary hypertension: Combined secondary as well as lung  disease (Chronic)    Primary Pulmonary HTN exacerbated by Diastolic LV Dysfunction / HF - Titrate up ARB as possible - with High LVEDP, will add standing diuretic - adding low dose diltiazem for additional rate cotnrol & pulmonary vasodilation.      Relevant Medications   furosemide (LASIX) 20 MG tablet   Smoker (Chronic)    Needs to quit.  Not quite ready to discuss options. Continue to counsel.         Current medicines are reviewed at length with the patient today. (+/- concerns) n/a The following changes have been made: no changes   Patient Instructions  Medication   Start taking furosemide ( lasix) 20 mg take one tablet by mouth every morning, if you have a bad night as you described to Dr Ellyn Hack, you may take an extra 20 mg tablet that day.    SCHEDULE AT [facility] has recommended that you wear a holter monitor-48 HOURS. Holter monitors are medical devices that record the heart's electrical activity. Doctors most often use these monitors to diagnose arrhythmias. Arrhythmias are problems with the speed or rhythm of the heartbeat. The monitor is a small, portable device. You can wear one while you do your normal daily activities. This is usually used to diagnose what is causing palpitations/syncope (passing out).    Your physician recommends that you schedule a follow-up appointment in [place] 2018 [place] 2019 WITH DR Lockbourne.  Studies Ordered:   Orders Placed This Encounter  Procedures  . [place] MONITOR - 48 HOUR      Glenetta Hew, M.D., M.S. Interventional Cardiologist   Pager # [phone] Phone # [phone] [address] [place], [place] [address]

## 2016-12-28 ENCOUNTER — Encounter: Payer: Self-pay | Admitting: Cardiology

## 2016-12-28 DIAGNOSIS — I272 Pulmonary hypertension, unspecified: Secondary | ICD-10-CM | POA: Insufficient documentation

## 2016-12-30 NOTE — Assessment & Plan Note (Signed)
Trial of mirtazepine

## 2016-12-30 NOTE — Assessment & Plan Note (Signed)
Encouraged continued efforts to quit smoking.

## 2016-12-31 NOTE — Assessment & Plan Note (Signed)
Moderate disease noted in the LAD - medical management for now.   Suspect that once her Aortic Valve is stenotic to the point of AVR, would need to be addressed.  Continue Statin for RF modificaiton  Continue Beta Blocker & ARB   Smoking cessation counseling

## 2016-12-31 NOTE — Assessment & Plan Note (Signed)
Multifactorial - COPD with pulmonary HTN in conjunction with AS/HTN related LV Diastolic dysfunction.

## 2016-12-31 NOTE — Assessment & Plan Note (Signed)
Primary Pulmonary HTN exacerbated by Diastolic LV Dysfunction / HF - Titrate up ARB as possible - with High LVEDP, will add standing diuretic - adding low dose diltiazem for additional rate cotnrol & pulmonary vasodilation.

## 2016-12-31 NOTE — Assessment & Plan Note (Signed)
Borderline blood pressure today. Plan: Add low-dose diuretic and diltiazem. Hold diltiazem until after wearing a 48-hour monitor.

## 2016-12-31 NOTE — Assessment & Plan Note (Signed)
Already on beta blocker. Plan have been 48-hour monitor to evaluate nighttime rapid heartbeats.  Start low-dose diltiazem in addition to beta blocker.

## 2016-12-31 NOTE — Assessment & Plan Note (Signed)
By cardiac catheterization, confirming moderate-severe not severe stenosis.    At this point, not yet at this stage requiring Aortic Valve Replacement.

## 2016-12-31 NOTE — Assessment & Plan Note (Signed)
Goal LDL should now be < 70. For now continue current dose of statin, but if LDL dose not come down the last ~5 points, increase atorvastatin.

## 2016-12-31 NOTE — Assessment & Plan Note (Signed)
Needs to quit.  Not quite ready to discuss options. Continue to counsel.

## 2017-01-13 ENCOUNTER — Ambulatory Visit (INDEPENDENT_AMBULATORY_CARE_PROVIDER_SITE_OTHER): Payer: Medicare Other

## 2017-01-13 DIAGNOSIS — R002 Palpitations: Secondary | ICD-10-CM | POA: Diagnosis not present

## 2017-01-13 DIAGNOSIS — R0609 Other forms of dyspnea: Secondary | ICD-10-CM | POA: Diagnosis not present

## 2017-01-13 DIAGNOSIS — R069 Unspecified abnormalities of breathing: Secondary | ICD-10-CM | POA: Diagnosis not present

## 2017-01-22 DIAGNOSIS — M1990 Unspecified osteoarthritis, unspecified site: Secondary | ICD-10-CM | POA: Diagnosis not present

## 2017-01-22 DIAGNOSIS — G894 Chronic pain syndrome: Secondary | ICD-10-CM | POA: Diagnosis not present

## 2017-01-22 DIAGNOSIS — M79606 Pain in leg, unspecified: Secondary | ICD-10-CM | POA: Diagnosis not present

## 2017-01-22 DIAGNOSIS — Z79899 Other long term (current) drug therapy: Secondary | ICD-10-CM | POA: Diagnosis not present

## 2017-01-22 DIAGNOSIS — M25569 Pain in unspecified knee: Secondary | ICD-10-CM | POA: Diagnosis not present

## 2017-01-22 DIAGNOSIS — Z79891 Long term (current) use of opiate analgesic: Secondary | ICD-10-CM | POA: Diagnosis not present

## 2017-02-07 ENCOUNTER — Ambulatory Visit (INDEPENDENT_AMBULATORY_CARE_PROVIDER_SITE_OTHER): Payer: Medicare Other | Admitting: Cardiology

## 2017-02-07 ENCOUNTER — Encounter: Payer: Self-pay | Admitting: Cardiology

## 2017-02-07 VITALS — BP 135/76 | HR 64 | Ht 65.5 in | Wt 155.8 lb

## 2017-02-07 DIAGNOSIS — R5382 Chronic fatigue, unspecified: Secondary | ICD-10-CM | POA: Diagnosis not present

## 2017-02-07 DIAGNOSIS — R002 Palpitations: Secondary | ICD-10-CM | POA: Diagnosis not present

## 2017-02-07 DIAGNOSIS — I471 Supraventricular tachycardia: Secondary | ICD-10-CM

## 2017-02-07 DIAGNOSIS — E785 Hyperlipidemia, unspecified: Secondary | ICD-10-CM

## 2017-02-07 DIAGNOSIS — I272 Pulmonary hypertension, unspecified: Secondary | ICD-10-CM | POA: Diagnosis not present

## 2017-02-07 DIAGNOSIS — I080 Rheumatic disorders of both mitral and aortic valves: Secondary | ICD-10-CM | POA: Diagnosis not present

## 2017-02-07 DIAGNOSIS — I2510 Atherosclerotic heart disease of native coronary artery without angina pectoris: Secondary | ICD-10-CM | POA: Diagnosis not present

## 2017-02-07 DIAGNOSIS — I10 Essential (primary) hypertension: Secondary | ICD-10-CM

## 2017-02-07 DIAGNOSIS — I4719 Other supraventricular tachycardia: Secondary | ICD-10-CM | POA: Insufficient documentation

## 2017-02-07 MED ORDER — DILTIAZEM HCL ER COATED BEADS 120 MG PO CP24: 120.0000 mg | ORAL_CAPSULE | Freq: Every day | ORAL | 3 refills | Status: DC

## 2017-02-07 NOTE — Progress Notes (Signed)
PCP: Harrison Mons, PA-C  Clinic Note: Chief Complaint  Patient presents with  . Follow-up    48-hour monitor for palpitations  . Shortness of Breath  . Aortic Stenosis    HPI: Alexandra [name] is a 66 y.o. female with a PMH below who presents today for Follow-up Cardiology evaluation and diagnosis of aortic stenosis - to follow-up from her recent catheterization.. She is Referred at the request of.of Harrison Mons, PA-C. She is a long-term smoker (down to 1-2/day)  with history of hypertension and hyperlipidemia who was seen by Dr. Prescott Gum for evaluation of aortic stenosis on August 15 --> he then referred her for cardiology evaluation following her echocardiogram.  Alexandra [name] was seen for initial consultation back on September 6. She has a significant family history of CAD and is noticing exertional dyspnea but not necessarily chest pain. No PND or orthopnea, but had noted some irregular heartbeats or palpitations. Because of the concern that her exertional dyspnea may be potentially related to her aortic stenosis being moderate to severe, we decided to proceed with possibly preoperative evaluation with right left heart catheterization did not show significant coronary artery disease.  Indicated moderate aortic stenosis and moderate left-sided-secondary pulmonary hypertension.  -->  I started her on low-dose Lasix and had aware 48-hour monitor for palpitations. --> Plan was to initiate treatment with diltiazem once we saw her monitor.  Recent Hospitalizations: None  Studies Personally Reviewed - (if available, images/films reviewed: From Epic Chart or Care Everywhere)  48-hour monitor November 2018: Mostly sinus rhythm with some PVCs and couplets, bigeminy and trigeminy.  Rare PACs.  Heart rate ranged from 36-129 bpm.  6 short runs of PAT/PSVT.  Interval History: [name] presents here today for follow-up indicating that she has had some dizziness with taking the Lasix and is 100  if she has to take it every day.  For the most part she just notes low energy.  She says she still has shortness of breath walking is hard to do much with any kind of walking. She still notes palpitations relatively short bursts that not associated with lightheadedness dizziness.  No syncope/near syncope or TIA/amaurosis fugax.[name]  No PND, orthopnea or significant edema. No melena, hematochezia or hematuria.  post-cath f/u  still noting persistent exertional dyspnea that has been going on for years. She has not had any chest tightness or pressure however. What she does notice that her heart rate tends to go up quite a bit at night and she is having some palpitation spells during the daytime as well. But more so at night. Partly because these tachycardia spells, she 7 heart time sleeping through the night. She does notice a little bit of swelling and maybe a mild orthopnea and occasional PND. She also notes occasionally feeling lightheaded and dizzy and can't really tell if these are related to palpitations or not. She is not the greatest of historians.  No further near-syncopal episodes. No resting or exertional chest tightness/pressure.  no TIA or amaurosis fugax symptoms and no active claudication symptoms although she does not walk very much..  ROS: A comprehensive was performed. Review of Systems  Constitutional: Positive for malaise/fatigue.  HENT: Negative for congestion and nosebleeds.   Respiratory: Positive for cough (Mostly in the mornings), shortness of breath and wheezing (Mostly in the mornings).   Cardiovascular: Positive for palpitations.  Gastrointestinal: Positive for heartburn (Depending on diet). Negative for abdominal pain, blood in stool, melena and vomiting.  Musculoskeletal: Negative for  falls and joint pain.  Neurological: Positive for dizziness. Negative for seizures and loss of consciousness (None since February).  Psychiatric/Behavioral: Negative for depression and memory  loss. The patient has insomnia (Has a hard time getting to sleep). The patient is not nervous/anxious.   All other systems reviewed and are negative.  I have reviewed and (if needed) personally updated the patient's problem list, medications, allergies, past medical and surgical history, social and family history.   Past Medical History:  Diagnosis Date  . GERD (gastroesophageal reflux disease)   . Heart murmur   . Hx of adenomatous colonic polyps 10/19/2015  . Hyperlipidemia   . Hypertension   . Nodule of right lung 07/05/2016   9 mm irregular, possibly spiculated, RIGHT lung base laterally. Rather than repeat CT scan in 3 months, patient elects to consider options for tissue sampling. Resolved on follow-up CT 10/23/2016.    Past Surgical History:  Procedure Laterality Date  . Aortic Arch Angiography N/A 08/09/2014   Performed by Serafina Mitchell, MD at [facility]  . COLONOSCOPY  2003, 2017  . CTA Chest  09/2016   Thoracic Aortic Ca2+ w/o dilation.  Coronary Calcification noted. PA normal. Mild Emphysematous changes w/ mild LLL scarring.    [name] FRACTURE SURGERY     2007 -right tib /fib fracture ----07/2009 right femur fx after a fall  . FRACTURE SURGERY Right 2011   Femur  . RIGHT/LEFT HEART CATH AND CORONARY ANGIOGRAPHY N/A 12/06/2016   Performed by Leonie Man, MD at [facility]  . TRANSTHORACIC ECHOCARDIOGRAM  10/30/2016   Mild Concentric LVH. EF 55-60%. No RWMA, Gr 2 DD. Mod-Severe AS (mean-peak Gradient 31 mmHG - 64 mmHg), Mod MR. Mod LA dilation, Mod PA HTN - peak pressure ~54 mmHg). = Progression of AS from 2014  . Ultrasound Guidance For Vascular Access  08/09/2014   Performed by Serafina Mitchell, MD at [facility]  . Upper Extremity Angiography Right 08/09/2014   Performed by Serafina Mitchell, MD at [facility]    Current Meds  Medication Sig  . aspirin 81 MG tablet Chew 81 mg by mouth as needed (palpitations).  [name] atorvastatin (LIPITOR) 20 MG  tablet Take 20 mg every evening by mouth.  . Azelastine HCl 0.15 % SOLN Place 2 sprays 2 (two) times daily as needed into the nose (allergies).  . bisoprolol-hydrochlorothiazide (ZIAC) 10-6.25 MG tablet take 1 tablet by mouth once daily  . fluticasone (FLOVENT HFA) 220 MCG/ACT inhaler Inhale 2 puffs into the lungs 2 (two) times daily.  . furosemide (LASIX) 20 MG tablet Take  20 mg by mouth in the morningneeded and may take an extra  tablet daily if needed  . losartan (COZAAR) 100 MG tablet Take 1 tablet (100 mg total) by mouth daily. Take existing 50mg  tabs 2 tabs daily until current Rx complete.  . mirtazapine (REMERON) 15 MG tablet Take 1 tablet (15 mg total) by mouth at bedtime.  [name] oxybutynin (DITROPAN-XL) 10 MG 24 hr tablet Take 1 tablet (10 mg total) by mouth at bedtime.  . OxyCODONE ER (XTAMPZA ER) 9 MG C12A Take 9 mg by mouth every 12 (twelve) hours.   [name] oxyCODONE-acetaminophen (PERCOCET) 10-325 MG tablet Take 1 tablet by mouth every 4 (four) hours as needed for pain.   . Vitamin D, Ergocalciferol, (DRISDOL) 50000 units CAPS capsule Take 1 capsule (50,000 Units total) by mouth every 7 (seven) days. (Patient taking differently: Take  50,000 Units by mouth every Sunday. )    Allergies  Allergen Reactions  . Codeine Nausea Only  . Symproic [Naldemedine] Nausea And Vomiting    Social History   Socioeconomic History  . Marital status: Married    Spouse name: [name]  . Number of children: 3  . Years of education: 12th grade  . Highest education level: None  Social Needs  . Financial resource strain: None  . Food insecurity - worry: None  . Food insecurity - inability: None  . Transportation needs - medical: None  . Transportation needs - non-medical: None  Occupational History  . Occupation: retired Aeronautical engineer    Comment: previously self-employed, now relies on [employer]  Tobacco Use  . Smoking status: Light Tobacco Smoker    Years: 20.00    Types:  Cigarettes  . Smokeless tobacco: Never Used  . Tobacco comment: has quit before- smokes 2 cigs a day   Substance and Sexual Activity  . Alcohol use: No    Alcohol/week: 0.0 oz  . Drug use: No    Comment: +cocaine in urine 12/19/2014  . Sexual activity: Yes    Partners: Male  Other Topics Concern  . None  Social History Narrative   Lives with her husband and her middle son.   Her two older sons are products of a previous relationship.   Her youngest son is from her current marriage.   Oldest and youngest sons live nearby.   She has total of 3 children and 6 grandchildren with 2 great-grandchildren.   She does not exercise because she has pain in her right leg.   Family History family history includes Heart attack in her brother; Heart disease in her father and mother; Hypertension in her mother.  Wt Readings from Last 3 Encounters:  02/07/17 155 lb 12.8 oz (70.7 kg)  12/27/16 151 lb (68.5 kg)  12/24/16 150 lb 6.4 oz (68.2 kg)    PHYSICAL EXAM BP 135/76   Pulse 64   Ht 5' 5.5" (1.664 m)   Wt 155 lb 12.8 oz (70.7 kg)   BMI 25.53 kg/m  Physical Exam  Constitutional: She is oriented to person, place, and time. She appears well-developed and well-nourished. No distress.  Well-groomed.  HENT:  Head: Normocephalic and atraumatic.  Mouth/Throat: Oropharynx is clear and moist. No oropharyngeal exudate.  Neck: Normal range of motion. Neck supple. No hepatojugular reflux and no JVD present. Carotid bruit is not present.  Cardiovascular: Normal rate, regular rhythm and intact distal pulses. Exam reveals gallop (Cannot exclude gallop. Difficult to hear. Some murmur).  Murmur heard.  Harsh crescendo-decrescendo mid to late systolic murmur is present with a grade of 3/6 at the upper right sternal border and upper left sternal border radiating to the neck. Pulmonary/Chest: Effort normal and breath sounds normal. No respiratory distress.  Baseline mild accessory muscle use, nonlabored.  Mild diffuse interstitial sounds, but no wheezes rales or rhonchi  Abdominal: Soft. Bowel sounds are normal. She exhibits no distension. There is no tenderness. There is no rebound.  Musculoskeletal: Normal range of motion. She exhibits no edema.  Neurological: She is alert and oriented to person, place, and time.  Skin: Skin is warm and dry. No rash noted. No erythema.  She has thick leathery smoker skin  Psychiatric: She has a normal mood and affect. Her behavior is normal. Judgment and thought content normal.  Somewhat subdued and flat affect. Normal mood  Nursing note and vitals reviewed.  Adult ECG Report Not checked  Other studies Reviewed: Additional studies/ records that were reviewed today include:  Recent Labs:   Lab Results  Component Value Date   CREATININE 0.73 11/28/2016   BUN 10 11/28/2016   NA 144 11/28/2016   K 4.6 11/28/2016   CL 103 11/28/2016   CO2 27 11/28/2016   Lab Results  Component Value Date   CHOL 137 07/30/2016   HDL 44 07/30/2016   LDLCALC 75 07/30/2016   TRIG 89 07/30/2016   CHOLHDL 3.1 07/30/2016   ASSESSMENT / PLAN:  Katlin returns today again somewhat confused.  Her energy level is low and she still has dyspnea which is probably related to lung disease more so than cardiac disease.  She does have pulmonary hypertension, but not significant enough to cause major problems.   Problem List Items Addressed This Visit    Aortic stenosis with mitral and aortic insufficiency - Primary (Chronic)    Not yet at the stage where we would consider aortic valve replacement in the absence of other coronary disease. Follow-up with echo next year. Treat risk factors -hypertension, hyperlipidemia and hyperglycemia.  Also smoking cessation counseling.      Relevant Medications   atorvastatin (LIPITOR) 20 MG tablet   diltiazem (CARDIZEM CD) 120 MG 24 hr capsule   Chronic fatigue (Chronic)    Not sure what this is related to.  I suspect she is not very  active and could be somewhat deconditioning related.      Coronary artery disease, non-occlusive (Chronic)   Relevant Medications   atorvastatin (LIPITOR) 20 MG tablet   diltiazem (CARDIZEM CD) 120 MG 24 hr capsule   Essential hypertension (Chronic)   Relevant Medications   atorvastatin (LIPITOR) 20 MG tablet   diltiazem (CARDIZEM CD) 120 MG 24 hr capsule   Hyperlipidemia with target low density lipoprotein (LDL) cholesterol less than 70 mg/dL (Chronic)   Relevant Medications   atorvastatin (LIPITOR) 20 MG tablet   diltiazem (CARDIZEM CD) 120 MG 24 hr capsule   Palpitations (Chronic)    Already on a beta-blocker.  Started low-dose calcium channel-blocker      PAT (paroxysmal atrial tachycardia) (HCC) (Chronic)    Intermittent episodes of palpitations short-lived.  She is already on a pretty decent dose of bisoprolol.  We will add diltiazem for additional control.      Relevant Medications   atorvastatin (LIPITOR) 20 MG tablet   diltiazem (CARDIZEM CD) 120 MG 24 hr capsule   Pulmonary hypertension: Combined secondary as well as lung disease (Chronic)    She has baseline pulmonary disease, gated by diastolic heart failure.  --Plan today will be to start diltiazem at 120 mg daily for combination of blood pressure control and pulmonary arterial dilation.--May need to adjust ARB dose.  However with elevated LVEDP, would like his good afterload reduction as possible. Current dose of Lasix but okay to hold doses for travel etc. Discussed sliding scale Lasix.      Relevant Medications   atorvastatin (LIPITOR) 20 MG tablet   diltiazem (CARDIZEM CD) 120 MG 24 hr capsule      Current medicines are reviewed at length with the patient today. (+/- concerns) n/a The following changes have been made: no changes   Patient Instructions  MEDICATION CHANGES  -- STOP TAKING ATORVASTATIN FOR ONE MONTH  TO SEE IF YOUR GETS BETTER FAITGUE - IF YOU CONTINUE TO BE FATIGUE RESTART MEDICATION   AND FOLLOW UP WITH PRIMARY , IF FATIGUE GETS BETTER  CONTACT OFFICE TO CHANGE MEDICATIONS.   START DILTIAZEM CD 120 MG  TAKE AT SUPPER TIME [facility] DAY    Your physician recommends that you schedule a follow-up appointment in MARCH 2019 [place].    If you need a refill on your cardiac medications before your next appointment, please call your pharmacy.  Studies Ordered:   No orders of the defined types were placed in this encounter.     Glenetta Hew, M.D., M.S. Interventional Cardiologist   Pager # [phone] Phone # [phone] [address] [place], [place] [address]

## 2017-02-07 NOTE — Patient Instructions (Signed)
MEDICATION CHANGES  -- STOP TAKING ATORVASTATIN FOR ONE MONTH  TO SEE IF YOUR GETS BETTER FAITGUE - IF YOU CONTINUE TO BE FATIGUE RESTART MEDICATION  AND FOLLOW UP WITH PRIMARY , IF FATIGUE GETS BETTER CONTACT OFFICE TO CHANGE MEDICATIONS.   START DILTIAZEM CD 120 MG  TAKE AT SUPPER TIME [facility] DAY    Your physician recommends that you schedule a follow-up appointment in MARCH 2019 [place].    If you need a refill on your cardiac medications before your next appointment, please call your pharmacy.

## 2017-02-09 ENCOUNTER — Encounter: Payer: Self-pay | Admitting: Cardiology

## 2017-02-09 NOTE — Assessment & Plan Note (Signed)
Already on a beta-blocker.  Started low-dose calcium channel-blocker

## 2017-02-09 NOTE — Assessment & Plan Note (Signed)
Not sure what this is related to.  I suspect she is not very active and could be somewhat deconditioning related.

## 2017-02-09 NOTE — Assessment & Plan Note (Signed)
She has baseline pulmonary disease, gated by diastolic heart failure.  --Plan today will be to start diltiazem at 120 mg daily for combination of blood pressure control and pulmonary arterial dilation.--May need to adjust ARB dose.  However with elevated LVEDP, would like his good afterload reduction as possible. Current dose of Lasix but okay to hold doses for travel etc. Discussed sliding scale Lasix.

## 2017-02-09 NOTE — Assessment & Plan Note (Signed)
Not yet at the stage where we would consider aortic valve replacement in the absence of other coronary disease. Follow-up with echo next year. Treat risk factors -hypertension, hyperlipidemia and hyperglycemia.  Also smoking cessation counseling.

## 2017-02-09 NOTE — Assessment & Plan Note (Signed)
Intermittent episodes of palpitations short-lived.  She is already on a pretty decent dose of bisoprolol.  We will add diltiazem for additional control.

## 2017-02-19 DIAGNOSIS — Z79899 Other long term (current) drug therapy: Secondary | ICD-10-CM | POA: Diagnosis not present

## 2017-02-19 DIAGNOSIS — M25569 Pain in unspecified knee: Secondary | ICD-10-CM | POA: Diagnosis not present

## 2017-02-19 DIAGNOSIS — Z79891 Long term (current) use of opiate analgesic: Secondary | ICD-10-CM | POA: Diagnosis not present

## 2017-02-19 DIAGNOSIS — M79606 Pain in leg, unspecified: Secondary | ICD-10-CM | POA: Diagnosis not present

## 2017-02-19 DIAGNOSIS — G894 Chronic pain syndrome: Secondary | ICD-10-CM | POA: Diagnosis not present

## 2017-02-19 DIAGNOSIS — M1990 Unspecified osteoarthritis, unspecified site: Secondary | ICD-10-CM | POA: Diagnosis not present

## 2017-03-03 ENCOUNTER — Other Ambulatory Visit: Payer: Self-pay | Admitting: Physician Assistant

## 2017-03-03 DIAGNOSIS — R350 Frequency of micturition: Secondary | ICD-10-CM

## 2017-03-04 ENCOUNTER — Ambulatory Visit: Payer: Medicare Other | Admitting: Cardiology

## 2017-03-11 ENCOUNTER — Telehealth: Payer: Self-pay | Admitting: Cardiology

## 2017-03-11 DIAGNOSIS — R5382 Chronic fatigue, unspecified: Secondary | ICD-10-CM

## 2017-03-11 DIAGNOSIS — Z79899 Other long term (current) drug therapy: Secondary | ICD-10-CM

## 2017-03-11 DIAGNOSIS — E785 Hyperlipidemia, unspecified: Secondary | ICD-10-CM

## 2017-03-11 NOTE — Telephone Encounter (Signed)
Pt of Dr. Ellyn Hack  Returned call. Pt stopped lipitor last month at [facility], d/t reported fatigue. Dr. Ellyn Hack instructed her to go off med and report to [place] in 30 days if she was having improvement to symptoms. Pt does note her fatigue has improved in that time. She's requesting further recommendations.  Pt aware Dr. Ellyn Hack can advise on alternative statin medication and we will f/u w her.

## 2017-03-11 NOTE — Telephone Encounter (Signed)
Let us recheck based on what those labs look like, I will determine whether or not simply switch to a different statin or use a different medicine.  Glad to hear that the energy level improved since being off of the statin.  Sometimes different statins have different effects.  Usually, the newer ones like Crestor have less side effect profile.  Lets wait to see where we stand in the new year -- just stop the atorvastatin completely for now.  Glenetta Hew, MD

## 2017-03-11 NOTE — Telephone Encounter (Signed)
Alexandra [name] is calling because she has been off of Lipitor for 30 days and Dr. Ellyn Hack wanted her to call back after 30 days . Please call   Thanks

## 2017-03-12 NOTE — Telephone Encounter (Signed)
Unable to reach pt or leave a message  

## 2017-03-14 DIAGNOSIS — Z79899 Other long term (current) drug therapy: Secondary | ICD-10-CM | POA: Insufficient documentation

## 2017-03-14 NOTE — Telephone Encounter (Addendum)
0Patient aware  labslip will be mailed to her - may do anytime in jan 2019. Verbalized understanding.

## 2017-03-14 NOTE — Addendum Note (Signed)
Addended by: Raiford Simmonds on: 03/14/2017 12:17 PM   Modules accepted: Orders

## 2017-03-14 NOTE — Telephone Encounter (Signed)
Patient aware of MD recommendations. She will stay off lipitor and come by office in the first few weeks of Jan 2019 for fasting labs. Lipid profile ordered. CC: Ivin Booty, RN as Juluis Rainier

## 2017-03-20 DIAGNOSIS — G894 Chronic pain syndrome: Secondary | ICD-10-CM | POA: Diagnosis not present

## 2017-03-20 DIAGNOSIS — M25569 Pain in unspecified knee: Secondary | ICD-10-CM | POA: Diagnosis not present

## 2017-03-20 DIAGNOSIS — Z79899 Other long term (current) drug therapy: Secondary | ICD-10-CM | POA: Diagnosis not present

## 2017-03-20 DIAGNOSIS — M1990 Unspecified osteoarthritis, unspecified site: Secondary | ICD-10-CM | POA: Diagnosis not present

## 2017-03-20 DIAGNOSIS — Z79891 Long term (current) use of opiate analgesic: Secondary | ICD-10-CM | POA: Diagnosis not present

## 2017-03-20 DIAGNOSIS — M79606 Pain in leg, unspecified: Secondary | ICD-10-CM | POA: Diagnosis not present

## 2017-04-23 ENCOUNTER — Other Ambulatory Visit: Payer: Self-pay | Admitting: Physician Assistant

## 2017-04-29 ENCOUNTER — Ambulatory Visit: Payer: Medicare Other | Admitting: Physician Assistant

## 2017-05-14 ENCOUNTER — Telehealth: Payer: Self-pay | Admitting: Physician Assistant

## 2017-05-14 NOTE — Telephone Encounter (Signed)
Copied from [place] [id number]. Topic: Quick Communication - Rx Refill/Question >> May 14, 2017 12:57 PM Tye [place] wrote: Contact pt if Dr. Jacqulynn Cadet cant fill medication  Medication: losartan (COZAAR) 100 MG tablet [[id number]]    Has the patient contacted their pharmacy? Yes.     (Agent: If no, request that the patient contact the pharmacy for the refill.)   Preferred Pharmacy (with phone number or street name): [facility]   Agent: Please be advised that RX refills may take up to 3 business days. We ask that you follow-up with your pharmacy.

## 2017-05-22 ENCOUNTER — Other Ambulatory Visit: Payer: Self-pay

## 2017-05-22 MED ORDER — LOSARTAN POTASSIUM 100 MG PO TABS: 100.0000 mg | ORAL_TABLET | Freq: Every day | ORAL | 0 refills | Status: DC

## 2017-05-23 ENCOUNTER — Other Ambulatory Visit: Payer: Self-pay | Admitting: Physician Assistant

## 2017-05-26 NOTE — Telephone Encounter (Signed)
LOV with Alexandra [name] on 12/24/16 / Refill request for Ziac / Refilled per protocol.  Patient will need a f/u appointment for future refills.

## 2017-06-11 ENCOUNTER — Other Ambulatory Visit: Payer: Self-pay | Admitting: Physician Assistant

## 2017-06-11 DIAGNOSIS — R350 Frequency of micturition: Secondary | ICD-10-CM

## 2017-06-17 ENCOUNTER — Encounter: Payer: Self-pay | Admitting: Cardiology

## 2017-06-17 ENCOUNTER — Ambulatory Visit (INDEPENDENT_AMBULATORY_CARE_PROVIDER_SITE_OTHER): Payer: Medicare Other | Admitting: Cardiology

## 2017-06-17 VITALS — BP 144/92 | HR 60 | Ht 66.0 in | Wt 152.0 lb

## 2017-06-17 DIAGNOSIS — R002 Palpitations: Secondary | ICD-10-CM

## 2017-06-17 DIAGNOSIS — I080 Rheumatic disorders of both mitral and aortic valves: Secondary | ICD-10-CM | POA: Diagnosis not present

## 2017-06-17 DIAGNOSIS — I10 Essential (primary) hypertension: Secondary | ICD-10-CM | POA: Diagnosis not present

## 2017-06-17 DIAGNOSIS — I272 Pulmonary hypertension, unspecified: Secondary | ICD-10-CM

## 2017-06-17 DIAGNOSIS — I471 Supraventricular tachycardia: Secondary | ICD-10-CM

## 2017-06-17 DIAGNOSIS — I2510 Atherosclerotic heart disease of native coronary artery without angina pectoris: Secondary | ICD-10-CM | POA: Diagnosis not present

## 2017-06-17 DIAGNOSIS — E785 Hyperlipidemia, unspecified: Secondary | ICD-10-CM | POA: Diagnosis not present

## 2017-06-17 DIAGNOSIS — R079 Chest pain, unspecified: Secondary | ICD-10-CM

## 2017-06-17 DIAGNOSIS — R0609 Other forms of dyspnea: Secondary | ICD-10-CM

## 2017-06-17 LAB — HEPATIC FUNCTION PANEL
ALK PHOS: 104 IU/L (ref 39–117)
ALT: 19 IU/L (ref 0–32)
AST: 19 IU/L (ref 0–40)
Albumin: 4.2 g/dL (ref 3.6–4.8)
BILIRUBIN TOTAL: 0.6 mg/dL (ref 0.0–1.2)
BILIRUBIN, DIRECT: 0.19 mg/dL (ref 0.00–0.40)
Total Protein: 6.8 g/dL (ref 6.0–8.5)

## 2017-06-17 LAB — LIPID PANEL
CHOL/HDL RATIO: 6 ratio — AB (ref 0.0–4.4)
CHOLESTEROL TOTAL: 234 mg/dL — AB (ref 100–199)
HDL: 39 mg/dL — AB (ref >39)
LDL Calculated: 166 mg/dL — ABNORMAL HIGH (ref 0–99)
TRIGLYCERIDES: 147 mg/dL (ref 0–149)
VLDL Cholesterol Cal: 29 mg/dL (ref 5–40)

## 2017-06-17 NOTE — Progress Notes (Signed)
PCP: Harrison Mons, PA-C  Clinic Note: Chief Complaint  Patient presents with  . Follow-up    Aortic Stenosis/ Mitral Regurg, Pulm HTN,  . Shortness of Breath  . Palpitations    much better with Diltiazem  . URI    cough, congestion    HPI: Alexandra [name] is a 67 y.o. female with a PMH below who presents today for 37-month cardiology follow-up for aortic stenosis.. She is a long-term smoker (down to 1-2/day) with history of hypertension and hyperlipidemia who was seen by Dr. Prescott Gum for evaluation of aortic stenosis on August 15 --> he then referred her for cardiology evaluation following her echocardiogram.  Initial consultation back on September 6. She has a significant family history of CAD and is noticing exertional dyspnea but not necessarily chest pain. No PND or orthopnea, but had noted some irregular heartbeats or palpitations   Alexandra [name] was last seen  in mid November 2018 following up results of 48-hour monitor.  She noticed dizziness and lethargy if she took her Lasix standing every day.  She noted that she was still having her racing heart rate spells/palpitations and short bursts, also noted exertional dyspnea but no chest pain or pressure.  Noted fatigue  --Recommended a statin holiday - felt better & did not restart Started diltiazem 120 mg daily.  Recent Hospitalizations: None  Studies Personally Reviewed - (if available, images/films reviewed: From Epic Chart or Care Everywhere)  No recent studies  Interval History: Alexandra [name] presents today for follow-up indicating that she has been feeling poorly for the last 2 or 3 weeks with congestion and coughing that is is persistent and she just fatigued.  However prior to starting to feel sick, she actually indicated that she was feeling a lot better when she started her diltiazem increased.  She said the irregular heartbeats of pretty much gone away as has the discomfort in her chest with walking.  She still gets short  of breath, but was doing much better.  It is hard for her to tell me how she was doing prior to this recent illness however.   She denies any PND or orthopnea without having the current congestion.  No significant edema and therefore has not really been using the Lasix that much.  She is taking it just about every other day at this point.  No resting or exertional chest pain.  She says she felt a little bit better having stopped the atorvastatin and therefore has not restarted it back.    Cardiovascular Review Of Symptoms: positive for - dyspnea on exertion and irregular heartbeat negative for - chest pain, edema, orthopnea, palpitations, paroxysmal nocturnal dyspnea, rapid heart rate or resting dyspnea, syncope/near syncope, TIA/Amaurosis Fugax Sx. No claudication.  ROS: A comprehensive was performed. Review of Systems  Constitutional: Positive for malaise/fatigue (Initially much improved after starting diltiazem, but now not feeling well because of URI.). Negative for chills, fever and weight loss.  HENT: Positive for congestion and sore throat. Negative for hearing loss and nosebleeds.   Respiratory: Positive for cough, shortness of breath and wheezing. Negative for hemoptysis and sputum production (Scant sputum with morning cough).        Also dealing with congestion and URI symptoms  Cardiovascular: Negative for palpitations and leg swelling.  Gastrointestinal: Negative for abdominal pain, blood in stool, constipation, heartburn and melena.  Genitourinary: Negative for dysuria and frequency.  Musculoskeletal: Positive for joint pain. Negative for falls.  Neurological: Positive for dizziness. Negative  for headaches.  Endo/Heme/Allergies: Negative for environmental allergies. Does not bruise/bleed easily.  Psychiatric/Behavioral: Positive for depression. Negative for memory loss. The patient is not nervous/anxious.   All other systems reviewed and are negative.  I have reviewed and (if  needed) personally updated the patient's problem list, medications, allergies, past medical and surgical history, social and family history.   Past Medical History:  Diagnosis Date  . COPD (chronic obstructive pulmonary disease) ([place]) 05/22/2016  . Coronary artery disease, non-occlusive 11/28/2016   R&LHC: 65% distal LAD lesion -- likely not angiographically significant->medical management. Normal LVEF. Moderately elevated LVEDP. I aortic valve gradient in the Cath Lab, moderate aortic stenosis. This would suggest that the stenosis is on the moderate side of moderate to severe.  Severe pulmonary hypertension (likely mixed).  AVA 0.96 cm; P-P gradient ~ 30 mmHg, Mean ~24.5-27.5 (Moderate AS)   . GERD (gastroesophageal reflux disease)   . Hx of adenomatous colonic polyps 10/19/2015  . Hyperlipidemia   . Hypertension   . Moderate mitral regurgitation by prior echocardiography 10/2016   Echocardiogram revealed moderate mitral regurgitation  . Moderate to severe aortic stenosis 10/2016   Echocardiogram showed moderate-severe left ear with a mean-peak gradient of 31 and 64 mmHg.  . Nodule of right lung 07/05/2016   9 mm irregular, possibly spiculated, RIGHT lung base laterally. Rather than repeat CT scan in 3 months, patient elects to consider options for tissue sampling. Resolved on follow-up CT 10/23/2016.  . Pulmonary hypertension associated Aortic Stenosis, Mitral Regurgitation & COPD    Peak PAP by Echo ~54 mmHg;  by Cath PAP/Mean 71/32 mmHg, 53 mmHg (with PCWP & LVEDP ~24-26 mmHg); TPG ~26 mmHg    Past Surgical History:  Procedure Laterality Date  . COLONOSCOPY  2003, 2017  . CTA Chest  09/2016   Thoracic Aortic Ca2+ w/o dilation.  Coronary Calcification noted. PA normal. Mild Emphysematous changes w/ mild LLL scarring.    [name] FRACTURE SURGERY     2007 -right tib /fib fracture ----07/2009 right femur fx after a fall  . FRACTURE SURGERY Right 2011   Femur  . PERIPHERAL VASCULAR CATHETERIZATION  N/A 08/09/2014   Procedure: Aortic Arch Angiography;  Surgeon: Serafina Mitchell, MD;  Location: [facility];  Service: Cardiovascular: Type 1 Arch. No significant stenosis, aneurysmal degeneration or dissection.  No luminal irregularity seen in R or L SubClavian, Brachial or Radial A. Chronic distal ulnar A occlusion [place].  [name] PERIPHERAL VASCULAR CATHETERIZATION Right 08/09/2014   Procedure: Upper Extremity Angiography;  Surgeon: Serafina Mitchell, MD;  Location: [facility];  Service: Cardiovascular;  Laterality: Right;  . RIGHT/LEFT HEART CATH AND CORONARY ANGIOGRAPHY N/A 12/06/2016   Procedure: RIGHT/LEFT HEART CATH AND CORONARY ANGIOGRAPHY;  Surgeon: Leonie Man, MD;  Location: [facility]: Cor Angio: 65% dLAD (Med Rx). AVA 0.96 cm; P-P gradient ~ 30 mmHg, Mean ~24.5-27.5 (Mod AS); RHC #s: RAP 8 mmHg, RVP/EDP: 71/8/15 mmHg, PCWP: 21-24 mmHg, PAP/mean: 71/32/52 mmHg = Severe Mixed Pulmonary HTN; LVP/EDP 205/17/26 mmHg ; CO/CI by Fick: 4.35, 2.44 - mildly reduced  . TRANSTHORACIC ECHOCARDIOGRAM  10/30/2016   Mild Concentric LVH. EF 55-60%. No RWMA, Gr 2 DD. Mod-Severe AS (mean-peak Gradient 31 mmHG - 64 mmHg), Mod MR. Mod LA dilation, Mod PA HTN - peak pressure ~54 mmHg). = Progression of AS from 2014  . ULTRASOUND GUIDANCE FOR VASCULAR ACCESS  08/09/2014   Procedure: Ultrasound Guidance For Vascular Access;  Surgeon: Serafina Mitchell, MD;  Location: [facility];  Service: Cardiovascular;;    48-hour monitor November 2018: Mostly sinus rhythm with some PVCs and couplets, bigeminy and trigeminy.  Rare PACs.  Heart rate ranged from 36-129 bpm.  6 short runs of PAT/PSVT. Which is  Current Outpatient Medications on File Prior to Visit  Medication Sig Dispense Refill  . aspirin 81 MG tablet Chew 81 mg by mouth as needed (palpitations).    [name] atorvastatin (LIPITOR) 20 MG tablet Take 20 mg every evening by mouth.    . Azelastine HCl 0.15 % SOLN Place 2 sprays 2 (two) times  daily as needed into the nose (allergies).    . bisoprolol-hydrochlorothiazide (ZIAC) 10-6.25 MG tablet take 1 tablet by mouth once daily 30 tablet 0  . diltiazem (CARDIZEM CD) 120 MG 24 hr capsule Take 1 capsule (120 mg total) daily after supper by mouth. 90 capsule 3  . fluticasone (FLOVENT HFA) 220 MCG/ACT inhaler Inhale 2 puffs into the lungs 2 (two) times daily. 1 Inhaler 12  . furosemide (LASIX) 20 MG tablet Take  20 mg by mouth in the morningneeded and may take an extra  tablet daily if needed 135 tablet 3  . losartan (COZAAR) 100 MG tablet Take 1 tablet (100 mg total) by mouth daily. 30 tablet 0  . mirtazapine (REMERON) 15 MG tablet Take 1 tablet (15 mg total) by mouth at bedtime. 30 tablet 3  . oxybutynin (DITROPAN-XL) 10 MG 24 hr tablet TAKE 1 TABLET BY MOUTH ONCE DAILY AT BEDTIME 90 tablet 0  . OxyCODONE ER (XTAMPZA ER) 9 MG C12A Take 9 mg by mouth every 12 (twelve) hours.     [name] oxyCODONE-acetaminophen (PERCOCET) 10-325 MG tablet Take 1 tablet by mouth every 4 (four) hours as needed for pain.     . Vitamin D, Ergocalciferol, (DRISDOL) 50000 units CAPS capsule Take 1 capsule (50,000 Units total) by mouth every 7 (seven) days. (Patient taking differently: Take 50,000 Units by mouth every Sunday. ) 30 capsule 1   No current facility-administered medications on file prior to visit.     No outpatient medications have been marked as taking for the 06/17/17 encounter (Office Visit) with Leonie Man, MD.    Allergies  Allergen Reactions  . Codeine Nausea Only  . Symproic [Naldemedine] Nausea And Vomiting    Social History   Tobacco Use  . Smoking status: Light Tobacco Smoker    Years: 20.00    Types: Cigarettes  . Smokeless tobacco: Never Used  . Tobacco comment: has quit before- smokes 2 cigs a day   Substance Use Topics  . Alcohol use: No    Alcohol/week: 0.0 oz  . Drug use: No    Comment: +cocaine in urine 12/19/2014   Social History   Social History Narrative    Lives with her husband and her middle son.   Her two older sons are products of a previous relationship.   Her youngest son is from her current marriage.   Oldest and youngest sons live nearby.   She has total of 3 children and 6 grandchildren with 2 great-grandchildren.   She does not exercise because she has pain in her right leg.    family history includes Heart attack in her brother; Heart disease in her father and mother; Hypertension in her mother.  Wt Readings from Last 3 Encounters:  06/17/17 152 lb (68.9 kg)  02/07/17 155 lb 12.8 oz (70.7 kg)  12/27/16 151 lb (68.5 kg)  PHYSICAL EXAM BP (!) 144/92 (BP Location: Left Arm)   Pulse 60   Ht 5\' 6"  (1.676 m)   Wt 152 lb (68.9 kg)   BMI 24.53 kg/m  Physical Exam  Constitutional: She is oriented to person, place, and time. She appears well-developed and well-nourished. No distress.  Somewhat chronically ill-appearing woman but in no acute distress.  Well-groomed  Neck: Decreased carotid pulses (Slightly) present. No hepatojugular reflux and no JVD present. Carotid bruit is not present (Referred AS murmur.  ).  Cardiovascular: Normal rate, regular rhythm, S1 normal, S2 normal and intact distal pulses.  Occasional extrasystoles are present. PMI is not displaced. Exam reveals distant heart sounds. Exam reveals no gallop (Cannot exclude gallop, but difficult to hear) and no friction rub.  Murmur heard.  Harsh crescendo-decrescendo midsystolic murmur is present at the upper right sternal border and upper left sternal border radiating to the neck. High-pitched blowing holosystolic murmur of grade 1/6 is also present at the apex. Pulmonary/Chest: Effort normal. No respiratory distress. She has no wheezes. She has no rales.  Diffuse interstitial sounds.  Mild baseline accessory muscle use.  Nonlabored.  Abdominal: Soft. Bowel sounds are normal. She exhibits no distension. There is no tenderness. There is no rebound.  Musculoskeletal:  Normal range of motion. She exhibits no edema.  Neurological: She is alert and oriented to person, place, and time.  Skin: Skin is warm and dry.  thick leathery /smoker skin  Psychiatric: She has a normal mood and affect. Her behavior is normal. Judgment and thought content normal.  Nursing note and vitals reviewed.   Adult ECG Report n/a  Other studies Reviewed: Additional studies/ records that were reviewed today include:    Recent Labs:   Lab Results  Component Value Date   CHOL 234 (H) 06/17/2017   HDL 39 (L) 06/17/2017   LDLCALC 166 (H) 06/17/2017   TRIG 147 06/17/2017   CHOLHDL 6.0 (H) 06/17/2017    ASSESSMENT / PLAN: Problem List Items Addressed This Visit    Pulmonary hypertension: Combined secondary as well as lung disease - Primary (Chronic)    Combined pulmonary pretension by right heart cath.  Per discussion with Dr. Marigene Ehlers from the heart failure team Will check PFTs to determine severity of COPD.  If there is indeed severe COPD and this would be consistent with a Group 3 Pulmonary Hypertension (from parenchymal lung disease)--  For now continue diltiazem and afterload reduction/diuresis. If there is not severe COPD, would consider referral for treatment with pulmonary vasodilators.  Otherwise will consider referral to pulmonary medicine for assistance with COPD management.       Relevant Orders   ECHOCARDIOGRAM COMPLETE   PAT (paroxysmal atrial tachycardia) (HCC) (Chronic)    Intermittent palpitations spells seem to be much more stabilized on the combination of diltiazem plus beta-blocker.      Palpitations (Chronic)    Notable improvement with the addition of diltiazem on top of beta-blocker.  Resting heart rate seems to be much better.  She is much less jittery.  Does not feeling well over the last 2 weeks because of URI symptoms.  Continue current dose of diltiazem plus beta-blocker for now.  May potentially titrate up in the future.      Relevant  Orders   ECHOCARDIOGRAM COMPLETE   Hyperlipidemia with target low density lipoprotein (LDL) cholesterol less than 70 mg/dL (Chronic)    Unfortunately, she did stop her atorvastatin and did not restart after last labs. Recheck labs on the  day of clinic today and her total cholesterol is up to 34 with an LDL of 166.  Clearly did not tolerate increased dose of atorvastatin.  Will notify her of the would like to try low-dose rosuvastatin.      Relevant Orders   Lipid panel (Completed)   Hepatic function panel (Completed)   Essential hypertension (Chronic)    She is pretty much on max dose of her ARB and  cannot really titrate up the beta-blocker or diltiazem because of resting heart rate of 60 bpm.   I am reluctant to add a new medication and would prefer to monitor to determine if any additional coverage in the future.      DOE (dyspnea on exertion) (Chronic)    Again multifactorial most likely related to his COPD with count of combined primary and secondary pulmonary pretension (transpulmonary gradient is 25-30 mmHg). --This is not a good pulmonary hypertension related to left heart issues (pulmonary venous hypertension) involving her aortic stenosis and mitral regurgitation as well as diastolic dysfunction in addition to primary pulmonary issues from hypoxic lung disease from COPD (pulmonary arterial hypertension).    I am a little bit concerned about the pulmonary hypertension and will discuss with our heart failure colleagues as to the best course of action going forward reference treatment of her pulmonary pretension.  Need to determine if the best course of action is referral for pulmonary evaluation given the clear transpulmonary gradient indication of a primary pulmonary component of her pulmonary hypertension.      Coronary artery disease, non-occlusive (Chronic)    No active anginal symptoms, however with moderate CAD and aortic valve disease, need to continue aggressive risk factor  modification.  Smoking cessation counseling, Restart statin pending lipid panel. Continue beta-blocker and ARB.      Relevant Orders   Lipid panel (Completed)   Hepatic function panel (Completed)   ECHOCARDIOGRAM COMPLETE   Chest pain with moderate risk for cardiac etiology    Still having intermittent episodes of chest discomfort, but not classic anginal symptoms.  The symptoms seem to have stabilized quite a bit since starting diltiazem.  Nonobstructive CAD by cath.  Continue to treat cardiac risk factors as well as diastolic dysfunction.        Aortic stenosis with mitral and aortic insufficiency (Chronic)    Recheck echo prior to 57-month follow-up to follow severity of aortic and mitral valve disease.  Continue treatment of hypertension.      Relevant Orders   ECHOCARDIOGRAM COMPLETE     Prolonged visit with extensive discussion.  Discussion, I then had a telephone conversation with Dr. Marigene Ehlers to discuss plan.  In addition to chart review and patient total time with patient and colleague discussion 45-50 minutes.  Greater than 50% of the time was in direct patient communication.  Current medicines are reviewed at length with the patient today. (+/- concerns) n/a The following changes have been made: none  Patient Instructions  NO MEDICATION CHANGES    LABS  LIVER PANEL LIPIDS   WILL DISCUSS WITH ANOTHER DOCTOR WHETHER OR NOT YOU NEED TO SEE PULMONARY OR [place]   TEST  Your physician has requested that you have an echocardiogram. Echocardiography is a painless test that uses sound waves to create images of your heart. It provides your doctor with information about the size and shape of your heart and how well your heart's chambers and valves are working. This procedure takes approximately one hour. There are no  restrictions for this procedure.    Your physician wants you to follow-up in [place] DR [place] AFTER ECHO. You will  receive a reminder letter in the mail two months in advance. If you don't receive a letter, please call our office to schedule the follow-up appointment.  If you need a refill on your cardiac medications before your next appointment, please call your pharmacy.    Studies Ordered:   Orders Placed This Encounter  Procedures  . Lipid panel  . Hepatic function panel  . ECHOCARDIOGRAM COMPLETE   Pulmonary Function Tests --we will need to contact the patient to inform her.     Glenetta Hew, M.D., M.S. Interventional Cardiologist   Pager # [phone] Phone # [phone] [address] [place], [place] [address]   Thank you for choosing [facility] at [facility]!!

## 2017-06-17 NOTE — Patient Instructions (Addendum)
NO MEDICATION CHANGES    LABS  LIVER PANEL LIPIDS   WILL DISCUSS WITH ANOTHER DOCTOR WHETHER OR NOT YOU NEED TO SEE PULMONARY OR [facility]   TEST  Your physician has requested that you have an echocardiogram. Echocardiography is a painless test that uses sound waves to create images of your heart. It provides your doctor with information about the size and shape of your heart and how well your heart's chambers and valves are working. This procedure takes approximately one hour. There are no restrictions for this procedure.    Your physician wants you to follow-up in [place] [address] [facility] AFTER ECHO. You will receive a reminder letter in the mail two months in advance. If you don't receive a letter, please call our office to schedule the follow-up appointment.  If you need a refill on your cardiac medications before your next appointment, please call your pharmacy.

## 2017-06-18 ENCOUNTER — Encounter: Payer: Self-pay | Admitting: Cardiology

## 2017-06-18 NOTE — Assessment & Plan Note (Signed)
Intermittent palpitations spells seem to be much more stabilized on the combination of diltiazem plus beta-blocker.

## 2017-06-18 NOTE — Assessment & Plan Note (Signed)
Recheck echo prior to 48-month follow-up to follow severity of aortic and mitral valve disease.  Continue treatment of hypertension.

## 2017-06-18 NOTE — Assessment & Plan Note (Signed)
No active anginal symptoms, however with moderate CAD and aortic valve disease, need to continue aggressive risk factor modification.  Smoking cessation counseling, Restart statin pending lipid panel. Continue beta-blocker and ARB.

## 2017-06-18 NOTE — Assessment & Plan Note (Addendum)
Combined pulmonary pretension by right heart cath.  Per discussion with Dr. Marigene Ehlers from the heart failure team Will check PFTs to determine severity of COPD.  If there is indeed severe COPD and this would be consistent with a Group 3 Pulmonary Hypertension (from parenchymal lung disease)--  For now continue diltiazem and afterload reduction/diuresis. If there is not severe COPD, would consider referral for treatment with pulmonary vasodilators.  Otherwise will consider referral to pulmonary medicine for assistance with COPD management.

## 2017-06-18 NOTE — Assessment & Plan Note (Signed)
Again multifactorial most likely related to his COPD with count of combined primary and secondary pulmonary pretension (transpulmonary gradient is 25-30 mmHg). --This is not a good pulmonary hypertension related to left heart issues (pulmonary venous hypertension) involving her aortic stenosis and mitral regurgitation as well as diastolic dysfunction in addition to primary pulmonary issues from hypoxic lung disease from COPD (pulmonary arterial hypertension).    I am a little bit concerned about the pulmonary hypertension and will discuss with our heart failure colleagues as to the best course of action going forward reference treatment of her pulmonary pretension.  Need to determine if the best course of action is referral for pulmonary evaluation given the clear transpulmonary gradient indication of a primary pulmonary component of her pulmonary hypertension.

## 2017-06-18 NOTE — Assessment & Plan Note (Signed)
She is pretty much on max dose of her ARB and  cannot really titrate up the beta-blocker or diltiazem because of resting heart rate of 60 bpm.   I am reluctant to add a new medication and would prefer to monitor to determine if any additional coverage in the future.

## 2017-06-18 NOTE — Assessment & Plan Note (Signed)
Notable improvement with the addition of diltiazem on top of beta-blocker.  Resting heart rate seems to be much better.  She is much less jittery.  Does not feeling well over the last 2 weeks because of URI symptoms.  Continue current dose of diltiazem plus beta-blocker for now.  May potentially titrate up in the future.

## 2017-06-18 NOTE — Assessment & Plan Note (Signed)
Unfortunately, she did stop her atorvastatin and did not restart after last labs. Recheck labs on the day of clinic today and her total cholesterol is up to 34 with an LDL of 166.  Clearly did not tolerate increased dose of atorvastatin.  Will notify her of the would like to try low-dose rosuvastatin.

## 2017-06-18 NOTE — Assessment & Plan Note (Addendum)
Still having intermittent episodes of chest discomfort, but not classic anginal symptoms.  The symptoms seem to have stabilized quite a bit since starting diltiazem.  Nonobstructive CAD by cath.  Continue to treat cardiac risk factors as well as diastolic dysfunction.

## 2017-06-19 ENCOUNTER — Telehealth: Payer: Self-pay | Admitting: *Deleted

## 2017-06-19 DIAGNOSIS — I272 Pulmonary hypertension, unspecified: Secondary | ICD-10-CM

## 2017-06-19 DIAGNOSIS — R0609 Other forms of dyspnea: Secondary | ICD-10-CM

## 2017-06-19 DIAGNOSIS — J449 Chronic obstructive pulmonary disease, unspecified: Secondary | ICD-10-CM

## 2017-06-19 NOTE — Telephone Encounter (Signed)
-----   Message from Alexandra Man, MD sent at 06/18/2017  4:44 PM EDT ----- Regarding: Updated plan for PT Ivin Booty I talked with Dr. Marigene Ehlers about what the next study to do with Ms. Cly is.  He recommended that we check PFTs.  If abnormal then referred to pulmonary medicine.  Glenetta Hew, MD

## 2017-06-20 NOTE — Telephone Encounter (Signed)
Spoke to patient and  informed her of Dr Ellyn Hack  Recommendation to have PFT . Aware scheduler will call her with time for PFT.

## 2017-06-20 NOTE — Addendum Note (Signed)
Addended by: Raiford Simmonds on: 06/20/2017 10:18 AM   Modules accepted: Orders

## 2017-06-26 ENCOUNTER — Other Ambulatory Visit: Payer: Self-pay | Admitting: Physician Assistant

## 2017-06-26 NOTE — Telephone Encounter (Signed)
Patient called and informed she will need to schedule a f/u office visit for COPD as noted at the last OV to f/u in February. She says "I thought I had an appointment, but no one called with the reminder, so I figured I didn't make it."  I advised she was a no show for the February appointment. She agreed to an appointment next week, scheduled for 07/02/17 at 1340.

## 2017-06-30 ENCOUNTER — Encounter: Payer: Self-pay | Admitting: Physician Assistant

## 2017-07-01 ENCOUNTER — Encounter (HOSPITAL_COMMUNITY): Payer: Medicare Other

## 2017-07-02 ENCOUNTER — Ambulatory Visit (INDEPENDENT_AMBULATORY_CARE_PROVIDER_SITE_OTHER): Payer: Medicare Other | Admitting: Physician Assistant

## 2017-07-02 ENCOUNTER — Other Ambulatory Visit: Payer: Self-pay

## 2017-07-02 ENCOUNTER — Encounter: Payer: Self-pay | Admitting: Physician Assistant

## 2017-07-02 VITALS — BP 118/84 | HR 67 | Temp 98.9°F | Resp 16 | Ht 66.0 in | Wt 151.0 lb

## 2017-07-02 DIAGNOSIS — I10 Essential (primary) hypertension: Secondary | ICD-10-CM

## 2017-07-02 DIAGNOSIS — I272 Pulmonary hypertension, unspecified: Secondary | ICD-10-CM

## 2017-07-02 DIAGNOSIS — F17200 Nicotine dependence, unspecified, uncomplicated: Secondary | ICD-10-CM

## 2017-07-02 DIAGNOSIS — I2510 Atherosclerotic heart disease of native coronary artery without angina pectoris: Secondary | ICD-10-CM

## 2017-07-02 DIAGNOSIS — J449 Chronic obstructive pulmonary disease, unspecified: Secondary | ICD-10-CM

## 2017-07-02 DIAGNOSIS — J302 Other seasonal allergic rhinitis: Secondary | ICD-10-CM

## 2017-07-02 DIAGNOSIS — E559 Vitamin D deficiency, unspecified: Secondary | ICD-10-CM

## 2017-07-02 MED ORDER — CETIRIZINE HCL 10 MG PO TABS: 10.0000 mg | ORAL_TABLET | Freq: Every day | ORAL | 99 refills | Status: DC

## 2017-07-02 MED ORDER — VITAMIN D (ERGOCALCIFEROL) 1.25 MG (50000 UNIT) PO CAPS: 50000.0000 [IU] | ORAL_CAPSULE | ORAL | 1 refills | Status: DC

## 2017-07-02 MED ORDER — LOSARTAN POTASSIUM 100 MG PO TABS: 100.0000 mg | ORAL_TABLET | Freq: Every day | ORAL | 3 refills | Status: DC

## 2017-07-02 MED ORDER — AZELASTINE HCL 0.15 % NA SOLN: 2.0000 | Freq: Two times a day (BID) | NASAL | 99 refills | Status: DC | PRN

## 2017-07-02 NOTE — Progress Notes (Signed)
Patient ID: Alexandra [name], female    DOB: October 26, 1950, 67 y.o.   MRN: [id number]  PCP: Alexandra Mons, PA-C  Chief Complaint  Patient presents with  . COPD    follow up     Subjective:   Presents for evaluation of COPD.  Missed her appointment with me in February. Didn't get a reminder call. She continues to work on smoking cessation, down to "one every once in a while."  Saw cardiology 06/17/2017. Combined pulmonary hypertension by right heart cath.  Planned PFTs to determine severity of COPD.  If there is severe COPD and this would be consistent with a Group 3 Pulmonary Hypertension (from parenchymal lung disease). For now, continue diltiazem and afterload reduction/diuresis. If there is not severe COPD, consider referral for treatment with pulmonary vasodilators.  Otherwise will consider referral to pulmonary medicine for assistance with COPD management.  Atorvastatin was stopped, due to feeling bad. Lipids were updated, but she hasn't been notified of the results.  For the past 2 to 3 weeks has had increased cough and laryngitis.  Pulmonary function testing with pulmonology had to be rescheduled.  She restarted as a lasting and added Benadryl to alleviate her symptoms.  Took her last dose of losartan about 10 days ago.  She understands from her pharmacy that her insurance does not cover it.   Review of Systems As above    Patient Active Problem List   Diagnosis Date Noted  . Medication management 03/14/2017  . PAT (paroxysmal atrial tachycardia) ([place]) 02/07/2017  . Chronic fatigue 02/07/2017  . Pulmonary hypertension: Combined secondary as well as lung disease 12/28/2016  . Coronary artery disease, non-occlusive 11/28/2016  . DOE (dyspnea on exertion) 11/28/2016  . Chest pain with moderate risk for cardiac etiology 11/28/2016  . Smoker 06/25/2016  . COPD (chronic obstructive pulmonary disease) ([place]) 05/22/2016  . Urinary frequency 02/20/2016  . Right knee DJD  12/09/2015  . Hx of adenomatous colonic polyps 10/19/2015  . Osteopenia 08/15/2015  . Femoral neck fracture ([place]) 07/16/2015  . Chronic pain syndrome 07/16/2015  . Primary localized osteoarthrosis of pelvic region 07/16/2015  . Anxiety state 07/16/2015  . Palpitations 07/16/2015  . Vitamin D deficiency 05/16/2015  . Aortic stenosis with mitral and aortic insufficiency 07/29/2014  . Essential hypertension 07/29/2014  . Hyperlipidemia with target low density lipoprotein (LDL) cholesterol less than 70 mg/dL 07/29/2014     Prior to Admission medications   Medication Sig Start Date End Date Taking? Authorizing Provider  aspirin 81 MG tablet Chew 81 mg by mouth as needed (palpitations).   Yes [provider]  atorvastatin (LIPITOR) 20 MG tablet Take 20 mg every evening by mouth.    [provider]  Azelastine HCl 0.15 % SOLN Place 2 sprays 2 (two) times daily as needed into the nose (allergies).   Yes [provider]  bisoprolol-hydrochlorothiazide [facility]) 10-6.25 MG tablet TAKE 1 TABLET BY MOUTH ONCE DAILY 06/26/17  Yes Jacqulynn Cadet, Brittini Brubeck, PA-C  diltiazem (CARDIZEM CD) 120 MG 24 hr capsule Take 1 capsule (120 mg total) daily after supper by mouth. 02/07/17  Yes Leonie Man, MD  fluticasone (FLOVENT HFA) 220 MCG/ACT inhaler Inhale 2 puffs into the lungs 2 (two) times daily. 07/30/16  Yes Amadu Schlageter, PA-C  furosemide (LASIX) 20 MG tablet Take  20 mg by mouth in the morningneeded and may take an extra  tablet daily if needed 12/27/16  Yes Leonie Man, MD  losartan (COZAAR) 100 MG tablet  Take 1 tablet (100 mg total) by mouth daily. 05/22/17  Yes Basel Defalco, PA-C  oxybutynin (DITROPAN-XL) 10 MG 24 hr tablet TAKE 1 TABLET BY MOUTH ONCE DAILY AT BEDTIME 06/12/17  Yes Linden Mikes, PA-C  OxyCODONE ER (XTAMPZA ER) 9 MG C12A Take 9 mg by mouth every 12 (twelve) hours.    Yes [provider]  oxyCODONE-acetaminophen (PERCOCET) 10-325 MG tablet Take 1 tablet  by mouth every 4 (four) hours as needed for pain.  02/05/16  Yes [provider]  Vitamin D, Ergocalciferol, (DRISDOL) 50000 units CAPS capsule Take 1 capsule (50,000 Units total) by mouth every 7 (seven) days. Patient taking differently: Take 50,000 Units by mouth every Sunday.  09/19/16  Yes Jariyah Hackley, PA-C  mirtazapine (REMERON) 15 MG tablet Take 1 tablet (15 mg total) by mouth at bedtime. Patient not taking: Reported on 07/02/2017 12/24/16   Alexandra Mons, PA-C     Allergies  Allergen Reactions  . Codeine Nausea Only  . Symproic [Naldemedine] Nausea And Vomiting       Objective:  Physical Exam  Constitutional: She is oriented to person, place, and time. She appears well-developed and well-nourished. She is active and cooperative. No distress.  BP 118/84   Pulse 67   Temp 98.9 F (37.2 C)   Resp 16   Ht 5\' 6"  (1.676 m)   Wt 151 lb (68.5 kg)   SpO2 96%   BMI 24.37 kg/m   HENT:  Head: Normocephalic and atraumatic.  Right Ear: Hearing normal.  Left Ear: Hearing normal.  Eyes: Conjunctivae are normal. No scleral icterus.  Neck: Normal range of motion. Neck supple. No thyromegaly present.  Cardiovascular: Normal rate, regular rhythm and normal heart sounds.  Pulses:      Radial pulses are 2+ on the right side, and 2+ on the left side.  Pulmonary/Chest: Effort normal and breath sounds normal.  Lymphadenopathy:       Head (right side): No tonsillar, no preauricular, no posterior auricular and no occipital adenopathy present.       Head (left side): No tonsillar, no preauricular, no posterior auricular and no occipital adenopathy present.    She has no cervical adenopathy.       Right: No supraclavicular adenopathy present.       Left: No supraclavicular adenopathy present.  Neurological: She is alert and oriented to person, place, and time. No sensory deficit.  Skin: Skin is warm, dry and intact. No rash noted. No cyanosis or erythema. Nails show no clubbing.    Psychiatric: She has a normal mood and affect. Her speech is normal and behavior is normal.           Assessment & Plan:   Problem List Items Addressed This Visit    Essential hypertension - Primary (Chronic)    Controlled.  Continue follow-up per cardiology.      Relevant Medications   losartan (COZAAR) 100 MG tablet   Smoker (Chronic)    Continue to encourage smoking cessation efforts.      Pulmonary hypertension: Combined secondary as well as lung disease (Chronic)    Proceed with planned pulmonary function testing.      Relevant Medications   losartan (COZAAR) 100 MG tablet   Vitamin D deficiency   Relevant Medications   Vitamin D, Ergocalciferol, (DRISDOL) 50000 units CAPS capsule   COPD (chronic obstructive pulmonary disease) (HCC)   Relevant Medications   Azelastine HCl 0.15 % SOLN   cetirizine (ZYRTEC) 10 MG tablet  Other Visit Diagnoses    Seasonal allergies       Relevant Medications   Azelastine HCl 0.15 % SOLN   cetirizine (ZYRTEC) 10 MG tablet       Return in about 3 months (around 10/01/2017) for re-evaluation.   Fara Chute, PA-C [facility]

## 2017-07-02 NOTE — Patient Instructions (Addendum)
If the allergies persist, let me know. I'll plan to send in another pill (montelukast).    IF you received an x-ray today, you will receive an invoice from [facility]. Please contact [facility] at [phone] with questions or concerns regarding your invoice.   IF you received labwork today, you will receive an invoice from [facility]. Please contact [facility] at [phone] with questions or concerns regarding your invoice.   Our billing staff will not be able to assist you with questions regarding bills from these companies.  You will be contacted with the lab results as soon as they are available. The fastest way to get your results is to activate your My Chart account. Instructions are located on the last page of this paperwork. If you have not heard from [place] regarding the results in 2 weeks, please contact this office.

## 2017-07-09 ENCOUNTER — Telehealth: Payer: Self-pay | Admitting: Physician Assistant

## 2017-07-09 MED ORDER — MONTELUKAST SODIUM 10 MG PO TABS: 10.0000 mg | ORAL_TABLET | Freq: Every day | ORAL | 3 refills | Status: DC

## 2017-07-09 NOTE — Telephone Encounter (Signed)
Please advise 

## 2017-07-09 NOTE — Telephone Encounter (Signed)
LOV 07/11/17 Alexandra [name]

## 2017-07-09 NOTE — Telephone Encounter (Signed)
Meds ordered this encounter  Medications  . montelukast (SINGULAIR) 10 MG tablet    Sig: Take 1 tablet (10 mg total) by mouth at bedtime.    Dispense:  30 tablet    Refill:  3    Order Specific Question:   Supervising Provider    Answer:   Brigitte Pulse, EVA N [[id number]]

## 2017-07-09 NOTE — Telephone Encounter (Signed)
Copied from [place] [phone]. Topic: Quick Communication - See Telephone Encounter >> Jul 09, 2017 12:59 PM Alexandra [name] wrote: CRM for notification.   Pt. Called saying the cetirizine is not working.  She is asking for something different to be called in.  Alexandra [name] told her to call if does not work.  She has taken since 4/10   [id number] [address], [place] AT [address]  [address] [place] [address]  Phone: [phone] Fax: [phone]    See Telephone encounter for: 07/09/17.

## 2017-07-10 NOTE — Telephone Encounter (Signed)
Phone call to patient. Unable to reach. If patient calls back, please let her know singulair was sent yesterday by [facility] to [place] on [address].

## 2017-07-15 ENCOUNTER — Other Ambulatory Visit: Payer: Self-pay | Admitting: Physician Assistant

## 2017-07-15 ENCOUNTER — Ambulatory Visit (HOSPITAL_COMMUNITY)
Admission: RE | Admit: 2017-07-15 | Discharge: 2017-07-15 | Disposition: A | Discharge status: Home / Self Care | Payer: Medicare Other | Source: Ambulatory Visit | Attending: Cardiology | Admitting: Cardiology

## 2017-07-15 DIAGNOSIS — J449 Chronic obstructive pulmonary disease, unspecified: Secondary | ICD-10-CM

## 2017-07-15 DIAGNOSIS — R0609 Other forms of dyspnea: Secondary | ICD-10-CM | POA: Insufficient documentation

## 2017-07-15 DIAGNOSIS — I272 Pulmonary hypertension, unspecified: Secondary | ICD-10-CM | POA: Diagnosis present

## 2017-07-15 LAB — PULMONARY FUNCTION TEST
DL/VA % PRED: 60 %
DL/VA: 3.03 ml/min/mmHg/L
DLCO UNC: 13.19 ml/min/mmHg
DLCO unc % pred: 50 %
FEF 25-75 POST: 1.32 L/s
FEF 25-75 Pre: 1.1 L/sec
FEF2575-%CHANGE-POST: 20 %
FEF2575-%PRED-POST: 63 %
FEF2575-%Pred-Pre: 53 %
FEV1-%CHANGE-POST: 8 %
FEV1-%PRED-PRE: 72 %
FEV1-%Pred-Post: 78 %
FEV1-Post: 1.95 L
FEV1-Pre: 1.79 L
FEV1FVC-%CHANGE-POST: 14 %
FEV1FVC-%PRED-PRE: 87 %
FEV6-%Change-Post: -3 %
FEV6-%Pred-Post: 81 %
FEV6-%Pred-Pre: 84 %
FEV6-Post: 2.55 L
FEV6-Pre: 2.63 L
FEV6FVC-%CHANGE-POST: 2 %
FEV6FVC-%Pred-Post: 104 %
FEV6FVC-%Pred-Pre: 101 %
FVC-%Change-Post: -5 %
FVC-%PRED-POST: 78 %
FVC-%PRED-PRE: 82 %
FVC-PRE: 2.7 L
FVC-Post: 2.55 L
POST FEV1/FVC RATIO: 76 %
PRE FEV6/FVC RATIO: 97 %
Post FEV6/FVC ratio: 100 %
Pre FEV1/FVC ratio: 67 %
RV % pred: 84 %
RV: 1.88 L
TLC % pred: 87 %
TLC: 4.62 L

## 2017-07-15 MED ORDER — ALBUTEROL SULFATE (2.5 MG/3ML) 0.083% IN NEBU
2.5000 mg | INHALATION_SOLUTION | Freq: Once | RESPIRATORY_TRACT | Status: AC
  Administered 2017-07-15: 2.5 mg via RESPIRATORY_TRACT

## 2017-07-15 NOTE — Telephone Encounter (Signed)
Bisoprolol refill Last OV: 07/02/17 Last Refill:06/26/17 15 tab Pharmacy:[facility] [address]  Harrison Mons PA

## 2017-07-23 ENCOUNTER — Telehealth (HOSPITAL_COMMUNITY): Payer: Self-pay | Admitting: Cardiology

## 2017-07-23 NOTE — Telephone Encounter (Signed)
Called patient to give her New Pulm HTN appt with Dr. Aundra Dubin.  No answer or machine on mobile or home phone numbers.  Will call patient back later.

## 2017-07-24 NOTE — Telephone Encounter (Signed)
Left message for patient to call back.  Need to give her new CHF appt info with Dr. Aundra Dubin

## 2017-07-29 NOTE — Telephone Encounter (Signed)
Left message for patient to call back  

## 2017-08-10 NOTE — Assessment & Plan Note (Signed)
Controlled.  Continue follow-up per cardiology.

## 2017-08-10 NOTE — Assessment & Plan Note (Signed)
Proceed with planned pulmonary function testing.

## 2017-08-10 NOTE — Assessment & Plan Note (Signed)
Continue to encourage smoking cessation efforts.

## 2017-08-19 ENCOUNTER — Encounter: Payer: Self-pay | Admitting: Family Medicine

## 2017-09-05 ENCOUNTER — Other Ambulatory Visit: Payer: Self-pay | Admitting: Physician Assistant

## 2017-09-05 ENCOUNTER — Ambulatory Visit
Admission: RE | Admit: 2017-09-05 | Discharge: 2017-09-05 | Disposition: A | Discharge status: Home / Self Care | Payer: Medicare Other | Source: Ambulatory Visit | Attending: Physician Assistant | Admitting: Physician Assistant

## 2017-09-05 DIAGNOSIS — M25562 Pain in left knee: Secondary | ICD-10-CM

## 2017-09-15 ENCOUNTER — Other Ambulatory Visit: Payer: Self-pay | Admitting: Physician Assistant

## 2017-09-15 DIAGNOSIS — R350 Frequency of micturition: Secondary | ICD-10-CM

## 2017-09-16 NOTE — Telephone Encounter (Signed)
Oxybutynin refill Last OV:07/02/17 Last refill:06/12/17 90 tab/0 refill [id number] to [place] for approval Pharmacy: [facility] [place], [address] AT [place] [address] [phone] (Phone) [phone] (Fax)

## 2017-10-01 ENCOUNTER — Ambulatory Visit (HOSPITAL_COMMUNITY)
Admission: RE | Admit: 2017-10-01 | Discharge: 2017-10-01 | Disposition: A | Discharge status: Home / Self Care | Payer: Medicare Other | Source: Ambulatory Visit | Attending: Cardiology | Admitting: Cardiology

## 2017-10-01 ENCOUNTER — Encounter (HOSPITAL_COMMUNITY): Payer: Self-pay | Admitting: Cardiology

## 2017-10-01 ENCOUNTER — Other Ambulatory Visit: Payer: Self-pay

## 2017-10-01 VITALS — BP 150/77 | HR 61 | Wt 154.5 lb

## 2017-10-01 DIAGNOSIS — Z79899 Other long term (current) drug therapy: Secondary | ICD-10-CM | POA: Diagnosis not present

## 2017-10-01 DIAGNOSIS — I110 Hypertensive heart disease with heart failure: Secondary | ICD-10-CM | POA: Insufficient documentation

## 2017-10-01 DIAGNOSIS — F17210 Nicotine dependence, cigarettes, uncomplicated: Secondary | ICD-10-CM | POA: Diagnosis not present

## 2017-10-01 DIAGNOSIS — I080 Rheumatic disorders of both mitral and aortic valves: Secondary | ICD-10-CM | POA: Diagnosis not present

## 2017-10-01 DIAGNOSIS — Z7951 Long term (current) use of inhaled steroids: Secondary | ICD-10-CM | POA: Diagnosis not present

## 2017-10-01 DIAGNOSIS — E785 Hyperlipidemia, unspecified: Secondary | ICD-10-CM | POA: Diagnosis not present

## 2017-10-01 DIAGNOSIS — I2510 Atherosclerotic heart disease of native coronary artery without angina pectoris: Secondary | ICD-10-CM | POA: Diagnosis not present

## 2017-10-01 DIAGNOSIS — Z8249 Family history of ischemic heart disease and other diseases of the circulatory system: Secondary | ICD-10-CM | POA: Insufficient documentation

## 2017-10-01 DIAGNOSIS — I5032 Chronic diastolic (congestive) heart failure: Secondary | ICD-10-CM | POA: Insufficient documentation

## 2017-10-01 DIAGNOSIS — J449 Chronic obstructive pulmonary disease, unspecified: Secondary | ICD-10-CM | POA: Insufficient documentation

## 2017-10-01 DIAGNOSIS — I350 Nonrheumatic aortic (valve) stenosis: Secondary | ICD-10-CM | POA: Insufficient documentation

## 2017-10-01 DIAGNOSIS — I272 Pulmonary hypertension, unspecified: Secondary | ICD-10-CM

## 2017-10-01 DIAGNOSIS — I2721 Secondary pulmonary arterial hypertension: Secondary | ICD-10-CM | POA: Insufficient documentation

## 2017-10-01 DIAGNOSIS — R0609 Other forms of dyspnea: Secondary | ICD-10-CM | POA: Diagnosis not present

## 2017-10-01 LAB — BASIC METABOLIC PANEL
ANION GAP: 7 (ref 5–15)
BUN: 12 mg/dL (ref 8–23)
CO2: 29 mmol/L (ref 22–32)
Calcium: 9.3 mg/dL (ref 8.9–10.3)
Chloride: 102 mmol/L (ref 98–111)
Creatinine, Ser: 1.04 mg/dL — ABNORMAL HIGH (ref 0.44–1.00)
GFR calc Af Amer: >60 mL/min (ref >60)
GFR, EST NON AFRICAN AMERICAN: 54 mL/min — AB (ref >60)
Glucose, Bld: 105 mg/dL — ABNORMAL HIGH (ref 70–99)
POTASSIUM: 4 mmol/L (ref 3.5–5.1)
SODIUM: 138 mmol/L (ref 135–145)

## 2017-10-01 LAB — BRAIN NATRIURETIC PEPTIDE: B NATRIURETIC PEPTIDE 5: 1918.8 pg/mL — AB (ref 0.0–100.0)

## 2017-10-01 MED ORDER — POTASSIUM CHLORIDE CRYS ER 20 MEQ PO TBCR: 20.0000 meq | EXTENDED_RELEASE_TABLET | Freq: Every day | ORAL | 6 refills | Status: DC

## 2017-10-01 MED ORDER — FUROSEMIDE 20 MG PO TABS: ORAL_TABLET | ORAL | 3 refills | Status: DC

## 2017-10-01 NOTE — Progress Notes (Signed)
PCP: Joaquin Courts Cardiology: Dr. Ellyn Hack HF Cardiology: Dr. Aundra Dubin  67 y.o. with history of COPD, aortic stenosis, diastolic CHF/pulmonary hypertension was referred by Dr. Ellyn Hack for evaluation of pulmonary hypertension.  She is an active smoker, now down to 1-2 cigs/day (heavier in the past).  However, PFTs in 4/19 showed only mild obstruction but DLCO was severely decreased.  Echo in 8/18 showed EF 50-55% with moderate aortic stenosis.  RHC/LHC done 9/18 showed nonobstructive CAD, elevated PCWP and severe mixed pulmonary venous/pulmonary arterial hypertension.  Aortic stenosis appeared moderate.    Currently, she is short of breath after walking 75-100 yards.  She is short of breath walking up inclines.  This has been fairly stable.  She also reports fatigue and poor energy level, "I have to make myself get out of bed."  No chest pain.  2 pillow orthopnea. She stopped her statin to see if this would help her energy level, but this had no effect.  She is currently taking Lasix 20 mg daily.  BP is high in the office today but generally runs SBP in 130s when she checks.  She has chronic right leg pain since breaking several bones in that leg.    Labs (9/18): creatinine 0.73 Labs (3/19): LDL 166  ECG (personally reviewed): NSR, LBBB (QRS 150 msec)  PMH: 1. COPD: She is an active smoker.  - PFTs (4/19): FVC 78%, FEV1 78%, ratio 100%, TLC 87%, DLCO 50%. Mild obstruction but severely decreased DLCO.  2. HTN 3. Hyperlipidemia 4. H/o atrial tachycardia 5. Aortic stenosis: Moderate on 8/18 echo.  Moderate on 9/18 LHC.  6. Chronic diastolic CHF with pulmonary hypertension:  - Echo (8/18): EF 50-55%, mild LVH, grade II diastolic dysfunction, PASP 54 mmHg, modrerate AS with mean gradient 31 mmHg.  - LHC/RHC (9/18): 60% dLAD, 40% ostial RCA, 40% OM3. Mean RA 8, PA 71/32 mean 52, mean PCWP 27, PVR 5.7 WU => mixed pulmonary venous and pulmonary arterial hypertension. CI 2.44.   SH: Retired, lives  with husband, smokes 1-2 cigs/day (heavier in the past).   FH: Parents with CAD (MIs), brother with MI at 40.    ROS: All systems reviewed and negative except as per HPI  Current Outpatient Medications  Medication Sig Dispense Refill  . aspirin 81 MG tablet Chew 81 mg by mouth as needed (palpitations).    [name] atorvastatin (LIPITOR) 20 MG tablet Take 20 mg every evening by mouth.    . Azelastine HCl 0.15 % SOLN Place 2 sprays into the nose 2 (two) times daily as needed (allergies). 30 mL PRN  . bisoprolol-hydrochlorothiazide (ZIAC) 10-6.25 MG tablet TAKE 1 TABLET BY MOUTH ONCE DAILY 90 tablet 0  . cetirizine (ZYRTEC) 10 MG tablet Take 1 tablet (10 mg total) by mouth at bedtime. 30 tablet PRN  . diltiazem (CARDIZEM CD) 120 MG 24 hr capsule Take 1 capsule (120 mg total) daily after supper by mouth. 90 capsule 3  . fluticasone (FLOVENT HFA) 220 MCG/ACT inhaler Inhale 2 puffs into the lungs 2 (two) times daily. 1 Inhaler 12  . furosemide (LASIX) 20 MG tablet Take 2 tabs in AM and 1 tab in PM 90 tablet 3  . losartan (COZAAR) 100 MG tablet Take 1 tablet (100 mg total) by mouth daily. 90 tablet 3  . montelukast (SINGULAIR) 10 MG tablet Take 1 tablet (10 mg total) by mouth at bedtime. 30 tablet 3  . oxybutynin (DITROPAN-XL) 10 MG 24 hr tablet TAKE 1 TABLET BY MOUTH ONCE DAILY  AT BEDTIME 90 tablet 0  . OxyCODONE ER (XTAMPZA ER) 9 MG C12A Take 9 mg by mouth every 12 (twelve) hours.     [name] oxyCODONE-acetaminophen (PERCOCET) 10-325 MG tablet Take 1 tablet by mouth every 4 (four) hours as needed for pain.     . Vitamin D, Ergocalciferol, (DRISDOL) 50000 units CAPS capsule Take 1 capsule (50,000 Units total) by mouth every 7 (seven) days. 30 capsule 1  . potassium chloride SA (K-DUR,KLOR-CON) 20 MEQ tablet Take 1 tablet (20 mEq total) by mouth daily. 30 tablet 6   No current facility-administered medications for this encounter.    BP (!) 150/77   Pulse 61   Wt 154 lb 8 oz (70.1 kg)   SpO2 96%   BMI 24.94  kg/m  General: NAD Neck: JVP 9-10 cm, no thyromegaly or thyroid nodule.  Lungs: Clear to auscultation bilaterally with normal respiratory effort. CV: Nondisplaced PMI.  Heart regular S1/S2, no N3/V6, 3/6 systolic murmur RUSB (S2 is soft), murmur radiates to the neck.  Trace ankle edema.  Difficult to palpate pedal pulses.  Abdomen: Soft, nontender, no hepatosplenomegaly, no distention.  Skin: Intact without lesions or rashes.  Neurologic: Alert and oriented x 3.  Psych: Normal affect. Extremities: No clubbing or cyanosis.  HEENT: Normal.   Assessment/Plan: 1. CAD: Nonobstructive on 9/18 cath.  - Continue ASA 81.  - Would restart on atorvastatin 40 mg daily given no change to fatigue off statin and significantly elevated LDL.  Lipids/LFTs in 3 months.  2. COPD: Active smoker,1-2 cigs/day.  PFTs showed only mild obstruction but DLCO was severely decreased.  - I strongly encouraged her to stop smoking entirely.  3. Chronic diastolic CHF with pulmonary hypertension: Echo in 8/18 with EF 50-55%.  Patient is volume overloaded on exam with NYHA class III symptoms.   - Increase Lasix to 40 qam/20 qpm and add KCl 20 daily.  BMET/BNP today, repeat BMET in 10 days.  4. Aortic stenosis: Moderate AS on workup about a year ago.  Exam could be consistent with severe AS, and severe AS could certainly explain worsening of symptoms.  - I will arrange for repeat echo to reassess aortic valve.  5. Pulmonary hypertension: Mixed pulmonary arterial and pulmonary venous hypertension on RHC in 9/18.  PH was severe.  Given COPD, would be concerned for group 3 PH, but PFTs did not show severe COPD.  Therefore, group 1 component to [facility] remains a concern.  - I think that the best plan here will be to diurese her more aggressively and resolve the pulmonary venous hypertension component.  I will also reassess the aortic valve => if severe AS is present, this will need to be addressed first.  Finally, after full diuresis and  reassessment of AS, I think it would be reasonable to repeat RHC.  If PAH is indeed present, would get V/Q scan to rule out chronic PE and consider treatment with selective pulmonary vasodilators.   Followup in 1 month  Loralie Champagne 10/02/2017

## 2017-10-01 NOTE — Patient Instructions (Addendum)
Increase Furosemide (Lasix) to 40 mg (2 tabs) in AM and 20 mg (1 tab)  Start Potassium (k-dur) 20 meq daily  Restart Atorvastatin    Labs today  Labs in 1 week  Your physician has requested that you have an echocardiogram. Echocardiography is a painless test that uses sound waves to create images of your heart. It provides your doctor with information about the size and shape of your heart and how well your heart's chambers and valves are working. This procedure takes approximately one hour. There are no restrictions for this procedure.  Your physician recommends that you schedule a follow-up appointment in: 1 month

## 2017-10-13 ENCOUNTER — Ambulatory Visit (HOSPITAL_COMMUNITY)
Admission: RE | Admit: 2017-10-13 | Discharge: 2017-10-13 | Disposition: A | Discharge status: Home / Self Care | Payer: Medicare Other | Source: Ambulatory Visit | Attending: Cardiology | Admitting: Cardiology

## 2017-10-13 DIAGNOSIS — I272 Pulmonary hypertension, unspecified: Secondary | ICD-10-CM | POA: Insufficient documentation

## 2017-10-13 DIAGNOSIS — R0609 Other forms of dyspnea: Secondary | ICD-10-CM | POA: Insufficient documentation

## 2017-10-13 LAB — BASIC METABOLIC PANEL
Anion gap: 10 (ref 5–15)
BUN: 16 mg/dL (ref 8–23)
CHLORIDE: 99 mmol/L (ref 98–111)
CO2: 30 mmol/L (ref 22–32)
Calcium: 9.1 mg/dL (ref 8.9–10.3)
Creatinine, Ser: 1.17 mg/dL — ABNORMAL HIGH (ref 0.44–1.00)
GFR calc non Af Amer: 47 mL/min — ABNORMAL LOW (ref >60)
GFR, EST AFRICAN AMERICAN: 54 mL/min — AB (ref >60)
Glucose, Bld: 118 mg/dL — ABNORMAL HIGH (ref 70–99)
Potassium: 3.7 mmol/L (ref 3.5–5.1)
Sodium: 139 mmol/L (ref 135–145)

## 2017-10-23 HISTORY — PX: TRANSTHORACIC ECHOCARDIOGRAM: SHX275

## 2017-10-29 ENCOUNTER — Other Ambulatory Visit (HOSPITAL_COMMUNITY): Payer: Medicare Other

## 2017-10-31 ENCOUNTER — Ambulatory Visit (HOSPITAL_COMMUNITY): Payer: Medicare Other | Attending: Internal Medicine

## 2017-10-31 ENCOUNTER — Other Ambulatory Visit: Payer: Self-pay

## 2017-10-31 ENCOUNTER — Encounter (INDEPENDENT_AMBULATORY_CARE_PROVIDER_SITE_OTHER): Payer: Self-pay

## 2017-10-31 DIAGNOSIS — J449 Chronic obstructive pulmonary disease, unspecified: Secondary | ICD-10-CM | POA: Insufficient documentation

## 2017-10-31 DIAGNOSIS — Z720 Tobacco use: Secondary | ICD-10-CM | POA: Insufficient documentation

## 2017-10-31 DIAGNOSIS — R0600 Dyspnea, unspecified: Secondary | ICD-10-CM | POA: Diagnosis not present

## 2017-10-31 DIAGNOSIS — R002 Palpitations: Secondary | ICD-10-CM

## 2017-10-31 DIAGNOSIS — I2510 Atherosclerotic heart disease of native coronary artery without angina pectoris: Secondary | ICD-10-CM

## 2017-10-31 DIAGNOSIS — I080 Rheumatic disorders of both mitral and aortic valves: Secondary | ICD-10-CM | POA: Diagnosis not present

## 2017-10-31 DIAGNOSIS — I119 Hypertensive heart disease without heart failure: Secondary | ICD-10-CM | POA: Insufficient documentation

## 2017-10-31 DIAGNOSIS — E785 Hyperlipidemia, unspecified: Secondary | ICD-10-CM | POA: Diagnosis not present

## 2017-10-31 DIAGNOSIS — I272 Pulmonary hypertension, unspecified: Secondary | ICD-10-CM

## 2017-11-04 ENCOUNTER — Telehealth: Payer: Self-pay | Admitting: *Deleted

## 2017-11-04 NOTE — Telephone Encounter (Signed)
LEFT MESSAGE FOR PATIENT TO CALL BACK  @ RESULTS .  WILL DISCUSS WITH DR [facility] WHEN FOLLOW UP IS NEEDED ,SINCE PATIENT HAS AN APPOINTMENT WITH DR V[facility] ON 11/07/17

## 2017-11-04 NOTE — Telephone Encounter (Signed)
-----   Message from Minus Breeding, MD sent at 11/01/2017 12:17 PM EDT ----- AS is severe.   Call with results and send to Harrison Mons, PA-C.  Send results to Dr. Ellyn Hack to discuss follow up and further testing of severe AS.

## 2017-11-07 ENCOUNTER — Encounter (HOSPITAL_COMMUNITY): Payer: Self-pay | Admitting: *Deleted

## 2017-11-07 ENCOUNTER — Other Ambulatory Visit (HOSPITAL_COMMUNITY): Payer: Self-pay | Admitting: *Deleted

## 2017-11-07 ENCOUNTER — Ambulatory Visit (HOSPITAL_COMMUNITY)
Admission: RE | Admit: 2017-11-07 | Discharge: 2017-11-07 | Disposition: A | Discharge status: Home / Self Care | Payer: Medicare Other | Source: Ambulatory Visit | Attending: Cardiology | Admitting: Cardiology

## 2017-11-07 VITALS — BP 154/83 | HR 76 | Wt 147.0 lb

## 2017-11-07 DIAGNOSIS — E785 Hyperlipidemia, unspecified: Secondary | ICD-10-CM | POA: Diagnosis not present

## 2017-11-07 DIAGNOSIS — Z7982 Long term (current) use of aspirin: Secondary | ICD-10-CM | POA: Insufficient documentation

## 2017-11-07 DIAGNOSIS — Z79891 Long term (current) use of opiate analgesic: Secondary | ICD-10-CM | POA: Diagnosis not present

## 2017-11-07 DIAGNOSIS — Z79899 Other long term (current) drug therapy: Secondary | ICD-10-CM | POA: Insufficient documentation

## 2017-11-07 DIAGNOSIS — J449 Chronic obstructive pulmonary disease, unspecified: Secondary | ICD-10-CM | POA: Insufficient documentation

## 2017-11-07 DIAGNOSIS — I080 Rheumatic disorders of both mitral and aortic valves: Secondary | ICD-10-CM | POA: Diagnosis not present

## 2017-11-07 DIAGNOSIS — I2510 Atherosclerotic heart disease of native coronary artery without angina pectoris: Secondary | ICD-10-CM | POA: Diagnosis not present

## 2017-11-07 DIAGNOSIS — F17210 Nicotine dependence, cigarettes, uncomplicated: Secondary | ICD-10-CM | POA: Insufficient documentation

## 2017-11-07 DIAGNOSIS — I110 Hypertensive heart disease with heart failure: Secondary | ICD-10-CM | POA: Diagnosis not present

## 2017-11-07 DIAGNOSIS — I350 Nonrheumatic aortic (valve) stenosis: Secondary | ICD-10-CM | POA: Insufficient documentation

## 2017-11-07 DIAGNOSIS — Z8249 Family history of ischemic heart disease and other diseases of the circulatory system: Secondary | ICD-10-CM | POA: Insufficient documentation

## 2017-11-07 DIAGNOSIS — I5032 Chronic diastolic (congestive) heart failure: Secondary | ICD-10-CM | POA: Diagnosis not present

## 2017-11-07 DIAGNOSIS — I272 Pulmonary hypertension, unspecified: Secondary | ICD-10-CM

## 2017-11-07 LAB — CBC
HEMATOCRIT: 43 % (ref 36.0–46.0)
HEMOGLOBIN: 13.3 g/dL (ref 12.0–15.0)
MCH: 28.6 pg (ref 26.0–34.0)
MCHC: 30.9 g/dL (ref 30.0–36.0)
MCV: 92.5 fL (ref 78.0–100.0)
Platelets: 274 10*3/uL (ref 150–400)
RBC: 4.65 MIL/uL (ref 3.87–5.11)
RDW: 13.4 % (ref 11.5–15.5)
WBC: 10.8 10*3/uL — ABNORMAL HIGH (ref 4.0–10.5)

## 2017-11-07 LAB — BASIC METABOLIC PANEL
ANION GAP: 10 (ref 5–15)
BUN: 10 mg/dL (ref 8–23)
CALCIUM: 9.8 mg/dL (ref 8.9–10.3)
CO2: 27 mmol/L (ref 22–32)
Chloride: 102 mmol/L (ref 98–111)
Creatinine, Ser: 1.04 mg/dL — ABNORMAL HIGH (ref 0.44–1.00)
GFR, EST NON AFRICAN AMERICAN: 54 mL/min — AB (ref >60)
Glucose, Bld: 111 mg/dL — ABNORMAL HIGH (ref 70–99)
POTASSIUM: 4 mmol/L (ref 3.5–5.1)
Sodium: 139 mmol/L (ref 135–145)

## 2017-11-07 MED ORDER — BISOPROLOL-HYDROCHLOROTHIAZIDE 10-6.25 MG PO TABS: 1.0000 | ORAL_TABLET | Freq: Every day | ORAL | 0 refills | Status: DC

## 2017-11-07 NOTE — Patient Instructions (Addendum)
Labs today (will call for abnormal results, otherwise no news is good news)  RESTART Bisoprolol/HCTz 1 Tablet Once Daily.  You have been scheduled for a right and left [place] Catheterization.  Please see attached instructions.   Referral has been placed for you to go see Dr. Burt Knack with [facility], their office will contact you to schedule appointment.

## 2017-11-08 NOTE — Addendum Note (Signed)
Encounter addended by: Larey Dresser, MD on: 11/08/2017 8:50 AM  Actions taken: LOS modified

## 2017-11-08 NOTE — H&P (View-Only) (Signed)
PCP: Joaquin Courts Cardiology: Dr. Ellyn Hack HF Cardiology: Dr. Aundra Dubin  67 y.o. with history of COPD, aortic stenosis, diastolic CHF/pulmonary hypertension was referred by Dr. Ellyn Hack for evaluation of pulmonary hypertension.  She is an active smoker, now down to 1-2 cigs/day (heavier in the past).  However, PFTs in 4/19 showed only mild obstruction but DLCO was severely decreased.  Echo in 8/18 showed EF 50-55% with moderate aortic stenosis.  RHC/LHC done 9/18 showed nonobstructive CAD, elevated PCWP and severe mixed pulmonary venous/pulmonary arterial hypertension.  Aortic stenosis appeared moderate at that time.    At initial appointment, she was volume overloaded, and I increased her Lasix.  Weight is down 7 lbs but she does not feel like this has helped her breathing.  She is still short of breath walking 75-100 yards.  She is short of breath walking up inclines.  Marked fatigue, very poor energy level.  2 pillow orthopnea. She had 2 episodes of chest pain since last appointment (epigastric area/lower chest), each lasted about 45 minutes and had no trigger.  She has chronic right leg pain since breaking several bones in that leg.    Echo was done in 8/19 and reviewed.  EF remains 55-60% but aortic stenosis appears to have progressed to severe range.   Labs (9/18): creatinine 0.73 Labs (3/19): LDL 166 Labs (7/19): K 3.7, creatinine 1.17, BNP 1919  ECG (personally reviewed): NSR, LBBB  PMH: 1. COPD: She is an active smoker.  - PFTs (4/19): FVC 78%, FEV1 78%, ratio 100%, TLC 87%, DLCO 50%. Mild obstruction but severely decreased DLCO.  2. HTN 3. Hyperlipidemia 4. H/o atrial tachycardia 5. Aortic stenosis: Moderate on 8/18 echo.  Moderate on 9/18 LHC.  - Severe AS on 8/19 echo.  6. Chronic diastolic CHF with pulmonary hypertension:  - Echo (8/18): EF 50-55%, mild LVH, grade II diastolic dysfunction, PASP 54 mmHg, modrerate AS with mean gradient 31 mmHg.  - LHC/RHC (9/18): 60% dLAD, 40%  ostial RCA, 40% OM3. Mean RA 8, PA 71/32 mean 52, mean PCWP 27, PVR 5.7 WU => mixed pulmonary venous and pulmonary arterial hypertension. CI 2.44.  - Echo (8/19): EF 55-60%, moderate LVH, severe AS with mean gradient 32 mmHg/peak 62 mmHg, AVA 0.8 cm^2.   SH: Retired, lives with husband, smokes 1-2 cigs/day (heavier in the past).   FH: Parents with CAD (MIs), brother with MI at 51.    ROS: All systems reviewed and negative except as per HPI  Current Outpatient Medications  Medication Sig Dispense Refill  . aspirin 81 MG tablet Chew 81 mg by mouth as needed (palpitations).    [name] atorvastatin (LIPITOR) 20 MG tablet Take 20 mg every evening by mouth.    . Azelastine HCl 0.15 % SOLN Place 2 sprays into the nose 2 (two) times daily as needed (allergies). 30 mL PRN  . cetirizine (ZYRTEC) 10 MG tablet Take 1 tablet (10 mg total) by mouth at bedtime. 30 tablet PRN  . diltiazem (CARDIZEM CD) 120 MG 24 hr capsule Take 1 capsule (120 mg total) daily after supper by mouth. 90 capsule 3  . fluticasone (FLOVENT HFA) 220 MCG/ACT inhaler Inhale 2 puffs into the lungs 2 (two) times daily. 1 Inhaler 12  . furosemide (LASIX) 20 MG tablet Take 2 tabs in AM and 1 tab in PM 90 tablet 3  . losartan (COZAAR) 100 MG tablet Take 1 tablet (100 mg total) by mouth daily. 90 tablet 3  . montelukast (SINGULAIR) 10 MG tablet Take 1  tablet (10 mg total) by mouth at bedtime. 30 tablet 3  . oxybutynin (DITROPAN-XL) 10 MG 24 hr tablet TAKE 1 TABLET BY MOUTH ONCE DAILY AT BEDTIME 90 tablet 0  . OxyCODONE ER (XTAMPZA ER) 9 MG C12A Take 9 mg by mouth every 12 (twelve) hours.     [name] oxyCODONE-acetaminophen (PERCOCET) 10-325 MG tablet Take 1 tablet by mouth every 4 (four) hours as needed for pain.     . potassium chloride SA (K-DUR,KLOR-CON) 20 MEQ tablet Take 1 tablet (20 mEq total) by mouth daily. 30 tablet 6  . Vitamin D, Ergocalciferol, (DRISDOL) 50000 units CAPS capsule Take 1 capsule (50,000 Units total) by mouth every 7 (seven)  days. 30 capsule 1  . bisoprolol-hydrochlorothiazide (ZIAC) 10-6.25 MG tablet Take 1 tablet by mouth daily. 90 tablet 0   No current facility-administered medications for this encounter.    BP (!) 154/83 (BP Location: Right Arm, Patient Position: Sitting)   Pulse 76   Wt 66.7 kg (147 lb)   SpO2 98%   BMI 23.73 kg/m  General: NAD Neck: No JVD, no thyromegaly or thyroid nodule.  Lungs: Clear to auscultation bilaterally with normal respiratory effort. CV: Nondisplaced PMI.  Heart regular S1/S2, no S3/S4, 3/6 crescendo-decrescendo systolic murmur RUSB (can hear S2).  No peripheral edema.  No carotid bruit.  Normal pedal pulses.  Abdomen: Soft, nontender, no hepatosplenomegaly, no distention.  Skin: Intact without lesions or rashes.  Neurologic: Alert and oriented x 3.  Psych: Normal affect. Extremities: No clubbing or cyanosis.  HEENT: Normal.    Assessment/Plan: 1. CAD: Nonobstructive on 9/18 cath. She has had a couple episodes of atypical chest pain.   - Continue ASA 81.  - She has restarted statin, would recheck lipids/LFTs next month.  - Will do coronary angiography along with RHC (see below) prior to TAVR.  2. COPD: Active smoker,1-2 cigs/day.  PFTs showed only mild obstruction but DLCO was severely decreased.  - I again strongly encouraged her to stop smoking entirely.  3. Chronic diastolic CHF with pulmonary hypertension: Echo in 8/19 with EF 55-60%, moderate LVH.  Volume status is significantly improved on increased Lasix but she still has significant dyspnea (NYHA class III).  Think this is due to severe AS.   - Continue Lasix 40 qam/20 qpm, BMET today.  4. Aortic stenosis: Moderate AS on workup in 8/18.  However, echo from 8/19 was reviewed and AS appears to be severe.  I suspect this is why she is still short of breath and fatigued despite good diuresis recently.  - I will arrange for LHC/RHC to reassess filling pressures/PA pressure/coronaries prior to TAVR evaluation.  We  discussed risks/benefits of procedure and she agrees.   - I will refer her for TAVR evaluation as I suspect she is at moderate risk for cardiac surgery and may be best served by TAVR.  5. Pulmonary hypertension: Mixed pulmonary arterial and pulmonary venous hypertension on RHC in 9/18.  PH was severe.  Given COPD, would be concerned for group 3 PH, but PFTs did not show severe COPD.  Therefore, group 1 component to [facility] still remains a concern. She has not been diuresed and does not look volume overloaded on exam, but she also has been found to have severe AS by echo.  - As above, repeat RHC prior to TAVR evaluation to reassess PA pressure.  6. HTN: She is out of bisoprolol, I will refill.     Followup in 1 month  Alexandra [name] 11/08/2017

## 2017-11-08 NOTE — Progress Notes (Signed)
PCP: Joaquin Courts Cardiology: Dr. Ellyn Hack HF Cardiology: Dr. Aundra Dubin  67 y.o. with history of COPD, aortic stenosis, diastolic CHF/pulmonary hypertension was referred by Dr. Ellyn Hack for evaluation of pulmonary hypertension.  She is an active smoker, now down to 1-2 cigs/day (heavier in the past).  However, PFTs in 4/19 showed only mild obstruction but DLCO was severely decreased.  Echo in 8/18 showed EF 50-55% with moderate aortic stenosis.  RHC/LHC done 9/18 showed nonobstructive CAD, elevated PCWP and severe mixed pulmonary venous/pulmonary arterial hypertension.  Aortic stenosis appeared moderate at that time.    At initial appointment, she was volume overloaded, and I increased her Lasix.  Weight is down 7 lbs but she does not feel like this has helped her breathing.  She is still short of breath walking 75-100 yards.  She is short of breath walking up inclines.  Marked fatigue, very poor energy level.  2 pillow orthopnea. She had 2 episodes of chest pain since last appointment (epigastric area/lower chest), each lasted about 45 minutes and had no trigger.  She has chronic right leg pain since breaking several bones in that leg.    Echo was done in 8/19 and reviewed.  EF remains 55-60% but aortic stenosis appears to have progressed to severe range.   Labs (9/18): creatinine 0.73 Labs (3/19): LDL 166 Labs (7/19): K 3.7, creatinine 1.17, BNP 1919  ECG (personally reviewed): NSR, LBBB  PMH: 1. COPD: She is an active smoker.  - PFTs (4/19): FVC 78%, FEV1 78%, ratio 100%, TLC 87%, DLCO 50%. Mild obstruction but severely decreased DLCO.  2. HTN 3. Hyperlipidemia 4. H/o atrial tachycardia 5. Aortic stenosis: Moderate on 8/18 echo.  Moderate on 9/18 LHC.  - Severe AS on 8/19 echo.  6. Chronic diastolic CHF with pulmonary hypertension:  - Echo (8/18): EF 50-55%, mild LVH, grade II diastolic dysfunction, PASP 54 mmHg, modrerate AS with mean gradient 31 mmHg.  - LHC/RHC (9/18): 60% dLAD, 40%  ostial RCA, 40% OM3. Mean RA 8, PA 71/32 mean 52, mean PCWP 27, PVR 5.7 WU => mixed pulmonary venous and pulmonary arterial hypertension. CI 2.44.  - Echo (8/19): EF 55-60%, moderate LVH, severe AS with mean gradient 32 mmHg/peak 62 mmHg, AVA 0.8 cm^2.   SH: Retired, lives with husband, smokes 1-2 cigs/day (heavier in the past).   FH: Parents with CAD (MIs), brother with MI at 48.    ROS: All systems reviewed and negative except as per HPI  Current Outpatient Medications  Medication Sig Dispense Refill  . aspirin 81 MG tablet Chew 81 mg by mouth as needed (palpitations).    [name] atorvastatin (LIPITOR) 20 MG tablet Take 20 mg every evening by mouth.    . Azelastine HCl 0.15 % SOLN Place 2 sprays into the nose 2 (two) times daily as needed (allergies). 30 mL PRN  . cetirizine (ZYRTEC) 10 MG tablet Take 1 tablet (10 mg total) by mouth at bedtime. 30 tablet PRN  . diltiazem (CARDIZEM CD) 120 MG 24 hr capsule Take 1 capsule (120 mg total) daily after supper by mouth. 90 capsule 3  . fluticasone (FLOVENT HFA) 220 MCG/ACT inhaler Inhale 2 puffs into the lungs 2 (two) times daily. 1 Inhaler 12  . furosemide (LASIX) 20 MG tablet Take 2 tabs in AM and 1 tab in PM 90 tablet 3  . losartan (COZAAR) 100 MG tablet Take 1 tablet (100 mg total) by mouth daily. 90 tablet 3  . montelukast (SINGULAIR) 10 MG tablet Take 1  tablet (10 mg total) by mouth at bedtime. 30 tablet 3  . oxybutynin (DITROPAN-XL) 10 MG 24 hr tablet TAKE 1 TABLET BY MOUTH ONCE DAILY AT BEDTIME 90 tablet 0  . OxyCODONE ER (XTAMPZA ER) 9 MG C12A Take 9 mg by mouth every 12 (twelve) hours.     [name] oxyCODONE-acetaminophen (PERCOCET) 10-325 MG tablet Take 1 tablet by mouth every 4 (four) hours as needed for pain.     . potassium chloride SA (K-DUR,KLOR-CON) 20 MEQ tablet Take 1 tablet (20 mEq total) by mouth daily. 30 tablet 6  . Vitamin D, Ergocalciferol, (DRISDOL) 50000 units CAPS capsule Take 1 capsule (50,000 Units total) by mouth every 7 (seven)  days. 30 capsule 1  . bisoprolol-hydrochlorothiazide (ZIAC) 10-6.25 MG tablet Take 1 tablet by mouth daily. 90 tablet 0   No current facility-administered medications for this encounter.    BP (!) 154/83 (BP Location: Right Arm, Patient Position: Sitting)   Pulse 76   Wt 66.7 kg (147 lb)   SpO2 98%   BMI 23.73 kg/m  General: NAD Neck: No JVD, no thyromegaly or thyroid nodule.  Lungs: Clear to auscultation bilaterally with normal respiratory effort. CV: Nondisplaced PMI.  Heart regular S1/S2, no S3/S4, 3/6 crescendo-decrescendo systolic murmur RUSB (can hear S2).  No peripheral edema.  No carotid bruit.  Normal pedal pulses.  Abdomen: Soft, nontender, no hepatosplenomegaly, no distention.  Skin: Intact without lesions or rashes.  Neurologic: Alert and oriented x 3.  Psych: Normal affect. Extremities: No clubbing or cyanosis.  HEENT: Normal.    Assessment/Plan: 1. CAD: Nonobstructive on 9/18 cath. She has had a couple episodes of atypical chest pain.   - Continue ASA 81.  - She has restarted statin, would recheck lipids/LFTs next month.  - Will do coronary angiography along with RHC (see below) prior to TAVR.  2. COPD: Active smoker,1-2 cigs/day.  PFTs showed only mild obstruction but DLCO was severely decreased.  - I again strongly encouraged her to stop smoking entirely.  3. Chronic diastolic CHF with pulmonary hypertension: Echo in 8/19 with EF 55-60%, moderate LVH.  Volume status is significantly improved on increased Lasix but she still has significant dyspnea (NYHA class III).  Think this is due to severe AS.   - Continue Lasix 40 qam/20 qpm, BMET today.  4. Aortic stenosis: Moderate AS on workup in 8/18.  However, echo from 8/19 was reviewed and AS appears to be severe.  I suspect this is why she is still short of breath and fatigued despite good diuresis recently.  - I will arrange for LHC/RHC to reassess filling pressures/PA pressure/coronaries prior to TAVR evaluation.  We  discussed risks/benefits of procedure and she agrees.   - I will refer her for TAVR evaluation as I suspect she is at moderate risk for cardiac surgery and may be best served by TAVR.  5. Pulmonary hypertension: Mixed pulmonary arterial and pulmonary venous hypertension on RHC in 9/18.  PH was severe.  Given COPD, would be concerned for group 3 PH, but PFTs did not show severe COPD.  Therefore, group 1 component to [facility] still remains a concern. She has not been diuresed and does not look volume overloaded on exam, but she also has been found to have severe AS by echo.  - As above, repeat RHC prior to TAVR evaluation to reassess PA pressure.  6. HTN: She is out of bisoprolol, I will refill.     Followup in 1 month  Loralie Champagne 11/08/2017

## 2017-11-17 ENCOUNTER — Ambulatory Visit (INDEPENDENT_AMBULATORY_CARE_PROVIDER_SITE_OTHER): Payer: Medicare Other | Admitting: Cardiovascular Disease

## 2017-11-17 VITALS — BP 148/92 | HR 64 | Ht 65.0 in | Wt 146.0 lb

## 2017-11-17 DIAGNOSIS — I2510 Atherosclerotic heart disease of native coronary artery without angina pectoris: Secondary | ICD-10-CM

## 2017-11-17 DIAGNOSIS — I350 Nonrheumatic aortic (valve) stenosis: Secondary | ICD-10-CM | POA: Diagnosis not present

## 2017-11-17 NOTE — Progress Notes (Signed)
Valve Clinic Consult Note  Chief Complaint  Patient presents with  . New Patient (Initial Visit)    SEVERE Aortic stenosis    History of Present Illness: 67 yo female with history of HTN, HLD, COPD, chronic diastolic CHF, pulmonary HTN, tobacco abuse, CAD and aortic stenosis who is referred to the valve clinic today for further discussion regarding her aortic stenosis and possible TAVR. She is followed by Dr. Ellyn Hack and most recently has been followed by Dr. Aundra Dubin due to her pulmonary HTN. Echo in August 2018 showed normal LV systolic function with moderate aortic stenosis. Right and left heart cath in September 2018 with moderately severe distal LAD stenosis, elevated wedge pressure and severe mixed pulmonary venous and pulmonary arterial HTN. Dr. Ellyn Hack elected to treat this medically. Volume overload managed with diuretics. Echo August 2019 with normal LV function but her aortic stenosis appeared to have progressed to the severe range. The mean gradient was 32 mmHg, peak gradient 62 mmHg, AVA 0.78 cm2. DVI 0.28. The valve was calcified and the leaflet motion was restricted. There was mild to moderate AI.   She tells me today that she has no energy. She has had several episodes of chest pain in the center of her chest pain that lasted for 30-40 minutes. She has no lower ext edema. Her breathing has been about the same. She has baseline dyspnea. She lives with her husband. Her children live in the area. She has full dentures. She is retired from housekeeping.   Primary Care Physician: Patient, [name] Primary Cardiologist: Dr. Ellyn Hack and Dr. Aundra Dubin [facility]) Referring Cardiologist: Dr. Aundra Dubin  Past Medical History:  Diagnosis Date  . COPD (chronic obstructive pulmonary disease) ([place]) 05/22/2016  . Coronary artery disease, non-occlusive 11/28/2016   R&LHC: 65% distal LAD lesion -- likely not angiographically significant->medical management. Normal LVEF. Moderately elevated LVEDP. I  aortic valve gradient in the Cath Lab, moderate aortic stenosis. This would suggest that the stenosis is on the moderate side of moderate to severe.  Severe pulmonary hypertension (likely mixed).  AVA 0.96 cm; P-P gradient ~ 30 mmHg, Mean ~24.5-27.5 (Moderate AS)   . GERD (gastroesophageal reflux disease)   . Hx of adenomatous colonic polyps 10/19/2015  . Hyperlipidemia   . Hypertension   . Moderate mitral regurgitation by prior echocardiography 10/2016   Echocardiogram revealed moderate mitral regurgitation  . Moderate to severe aortic stenosis 10/2016   Echocardiogram showed moderate-severe left ear with a mean-peak gradient of 31 and 64 mmHg.  . Nodule of right lung 07/05/2016   9 mm irregular, possibly spiculated, RIGHT lung base laterally. Rather than repeat CT scan in 3 months, patient elects to consider options for tissue sampling. Resolved on follow-up CT 10/23/2016.  . Pulmonary hypertension associated Aortic Stenosis, Mitral Regurgitation & COPD    Peak PAP by Echo ~54 mmHg;  by Cath PAP/Mean 71/32 mmHg, 53 mmHg (with PCWP & LVEDP ~24-26 mmHg); TPG ~26 mmHg    Past Surgical History:  Procedure Laterality Date  . COLONOSCOPY  2003, 2017  . CTA Chest  09/2016   Thoracic Aortic Ca2+ w/o dilation.  Coronary Calcification noted. PA normal. Mild Emphysematous changes w/ mild LLL scarring.    [name] FRACTURE SURGERY     2007 -right tib /fib fracture ----07/2009 right femur fx after a fall  . FRACTURE SURGERY Right 2011   Femur  . PERIPHERAL VASCULAR CATHETERIZATION N/A 08/09/2014   Procedure: Aortic Arch Angiography;  Surgeon: Serafina Mitchell, MD;  Location: [place] [facility];  Service: Cardiovascular: Type 1 Arch. No significant stenosis, aneurysmal degeneration or dissection.  No luminal irregularity seen in R or L SubClavian, Brachial or Radial A. Chronic distal ulnar A occlusion [place].  [name] PERIPHERAL VASCULAR CATHETERIZATION Right 08/09/2014   Procedure: Upper Extremity Angiography;   Surgeon: Serafina Mitchell, MD;  Location: [place] [facility];  Service: Cardiovascular;  Laterality: Right;  . RIGHT/LEFT HEART CATH AND CORONARY ANGIOGRAPHY N/A 12/06/2016   Procedure: RIGHT/LEFT HEART CATH AND CORONARY ANGIOGRAPHY;  Surgeon: Leonie Man, MD;  Location: [facility]: Cor Angio: 65% dLAD (Med Rx). AVA 0.96 cm; P-P gradient ~ 30 mmHg, Mean ~24.5-27.5 (Mod AS); RHC #s: RAP 8 mmHg, RVP/EDP: 71/8/15 mmHg, PCWP: 21-24 mmHg, PAP/mean: 71/32/52 mmHg = Severe Mixed Pulmonary HTN; LVP/EDP 205/17/26 mmHg ; CO/CI by Fick: 4.35, 2.44 - mildly reduced  . TRANSTHORACIC ECHOCARDIOGRAM  10/30/2016   Mild Concentric LVH. EF 55-60%. No RWMA, Gr 2 DD. Mod-Severe AS (mean-peak Gradient 31 mmHG - 64 mmHg), Mod MR. Mod LA dilation, Mod PA HTN - peak pressure ~54 mmHg). = Progression of AS from 2014  . ULTRASOUND GUIDANCE FOR VASCULAR ACCESS  08/09/2014   Procedure: Ultrasound Guidance For Vascular Access;  Surgeon: Serafina Mitchell, MD;  Location: [place] [facility];  Service: Cardiovascular;;    Current Outpatient Medications  Medication Sig Dispense Refill  . aspirin 81 MG tablet Take 81 mg by mouth daily as needed (palpitations).     . Azelastine HCl 0.15 % SOLN Place 2 sprays into the nose 2 (two) times daily as needed (allergies). 30 mL PRN  . bisoprolol-hydrochlorothiazide (ZIAC) 10-6.25 MG tablet Take 1 tablet by mouth daily. 90 tablet 0  . cetirizine (ZYRTEC) 10 MG tablet Take 1 tablet (10 mg total) by mouth at bedtime. 30 tablet PRN  . diltiazem (CARDIZEM CD) 120 MG 24 hr capsule Take 1 capsule (120 mg total) daily after supper by mouth. 90 capsule 3  . fluticasone (FLOVENT HFA) 220 MCG/ACT inhaler Inhale 2 puffs into the lungs 2 (two) times daily. 1 Inhaler 12  . furosemide (LASIX) 20 MG tablet Take 2 tabs in AM and 1 tab in PM 90 tablet 3  . losartan (COZAAR) 100 MG tablet Take 1 tablet (100 mg total) by mouth daily. 90 tablet 3  . montelukast (SINGULAIR) 10 MG tablet Take 1 tablet  (10 mg total) by mouth at bedtime. 30 tablet 3  . oxybutynin (DITROPAN-XL) 10 MG 24 hr tablet TAKE 1 TABLET BY MOUTH ONCE DAILY AT BEDTIME (Patient taking differently: Take 10 mg by mouth at bedtime. ) 90 tablet 0  . oxyCODONE-acetaminophen (PERCOCET) 10-325 MG tablet Take 1 tablet by mouth every 4 (four) hours as needed for pain.     . potassium chloride SA (K-DUR,KLOR-CON) 20 MEQ tablet Take 1 tablet (20 mEq total) by mouth daily. 30 tablet 6  . Vitamin D, Ergocalciferol, (DRISDOL) 50000 units CAPS capsule Take 1 capsule (50,000 Units total) by mouth every 7 (seven) days. 30 capsule 1   No current facility-administered medications for this visit.     Allergies  Allergen Reactions  . Codeine Nausea Only  . Symproic [Naldemedine] Nausea And Vomiting    Social History   Socioeconomic History  . Marital status: Married    Spouse name: [name]  . Number of children: 3  . Years of education: 12th grade  . Highest education level: Not on file  Occupational History  . 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 Financial resource strain: Not on file  . Food insecurity:    Worry: Not on file    Inability: Not on file  . Transportation needs:    Medical: Not on file    Non-medical: Not on file  Tobacco Use  . Smoking status: Light Tobacco Smoker    Years: 20.00    Types: Cigarettes  . Smokeless tobacco: Never Used  . Tobacco comment: has quit before- smokes 2 cigs a day   Substance and Sexual Activity  . Alcohol use: No    Alcohol/week: 0.0 standard drinks  . Drug use: No    Comment: +cocaine in urine 12/19/2014  . Sexual activity: Yes    Partners: Male  Lifestyle  . Physical activity:    Days per week: Not on file    Minutes per session: Not on file  . Stress: Not on file  Relationships  . Social connections:    Talks on phone: Not on file    Gets together: Not on file    Attends  religious service: Not on file    Active member of club or organization: Not on file    Attends meetings of clubs or organizations: Not on file    Relationship status: Not on file  . Intimate partner violence:    Fear of current or ex partner: Not on file    Emotionally abused: Not on file    Physically abused: Not on file    Forced sexual activity: Not on file  Other Topics Concern  . Not on file  Social History Narrative   Lives with her husband and her middle son.   Her two older sons are products of a previous relationship.   Her youngest son is from her current marriage.   Oldest and youngest sons live nearby.   She has total of 3 children and 6 grandchildren with 2 great-grandchildren.   She does not exercise because she has pain in her right leg.    Family History  Problem Relation Age of Onset  . Hypertension Mother   . Heart disease Mother        She is unaware of the details  . Heart disease Father        Unaware of details  . Heart attack Brother   . Colon cancer Neg Hx   . Colon polyps Neg Hx   . Esophageal cancer Neg Hx   . Rectal cancer Neg Hx   . Stomach cancer Neg Hx     Review of Systems:  As stated in the HPI and otherwise negative.   BP (!) 148/92   Pulse 64   Ht 5\' 5"  (1.651 m)   Wt 146 lb (66.2 kg)   BMI 24.30 kg/m   Physical Examination: General: Well developed, well nourished, NAD  HEENT: OP clear, mucus membranes moist  SKIN: warm, dry. No rashes. Neuro: No focal deficits  Musculoskeletal: Muscle strength 5/5 all ext  Psychiatric: Mood and affect normal  Neck: No JVD, no carotid bruits, no thyromegaly, no lymphadenopathy.  Lungs:Clear bilaterally, no wheezes, rhonci, crackles Cardiovascular: Regular rate and rhythm. Loud, harsh, late peaking systolic murmur.  Abdomen:Soft. Bowel sounds present. Non-tender.  Extremities: No lower extremity edema. Pulses are 2 + in the bilateral DP/PT.  EKG:  EKG is not ordered today. The ekg ordered  today demonstrates   Recent Labs: 06/17/2017: ALT 19  10/01/2017: B Natriuretic Peptide 1,918.8 11/07/2017: BUN 10; Creatinine, Ser 1.04; Hemoglobin 13.3; Platelets 274; Potassium 4.0; Sodium 139   Lipid Panel    Component Value Date/Time   CHOL 234 (H) 06/17/2017 1116   TRIG 147 06/17/2017 1116   HDL 39 (L) 06/17/2017 1116   CHOLHDL 6.0 (H) 06/17/2017 1116   CHOLHDL 3.6 02/20/2016 1156   VLDL 19 02/20/2016 1156   LDLCALC 166 (H) 06/17/2017 1116     Wt Readings from Last 3 Encounters:  11/17/17 146 lb (66.2 kg)  11/07/17 147 lb (66.7 kg)  10/01/17 154 lb 8 oz (70.1 kg)     Other studies Reviewed: Additional studies/ records that were reviewed today include: echo images, cath images.  Review of the above records demonstrates:    Assessment and Plan:   1. Severe aortic valve stenosis: She has severe, stage D aortic valve stenosis. I have personally reviewed the echo images. The aortic valve is thickened, calcified with limited leaflet mobility. I think she would benefit from AVR. She appears to be a good candidate for TAVR.   STS Risk Score:  Procedure: Isolated AVR  Risk of Mortality: 1.894%  Renal Failure: 1.209%  Permanent Stroke: 0.895%  Prolonged Ventilation: 6.528%  DSW Infection: 0.069%  Reoperation: 2.318%  Morbidity or Mortality: 9.547%  Short Length of Stay: 44.583%  Long Length of Stay: 4.044%    I have reviewed the natural history of aortic stenosis with the patient and their family members  who are present today. We have discussed the limitations of medical therapy and the poor prognosis associated with symptomatic aortic stenosis. We have reviewed potential treatment options, including palliative medical therapy, conventional surgical aortic valve replacement, and transcatheter aortic valve replacement. We discussed treatment options in the context of the patient's specific comorbid medical conditions.   She would like to proceed with planning for TAVR. Right  and left heart catheterization are planned this week at [facility] per Dr. Aundra Dubin. Risks and benefits of the TAVR procedure reviewed with the patient. After the cath, she will have a cardiac CT, CTA of the chest/abdomen and pelvis, PFTs, carotid dopplers, PT assessment and will then be referred to see one of the CT surgeons on our TAVR team.    Current medicines are reviewed at length with the patient today.  The patient does not have concerns regarding medicines.  The following changes have been made:  no change  Labs/ tests ordered today include:  No orders of the defined types were placed in this encounter.    Disposition:   FU with the valve team.    Signed, Lauree Chandler, MD 11/17/2017 2:35 PM    [address], [place], [place]  [address] Phone: [phone]; Fax: [phone]

## 2017-11-18 ENCOUNTER — Other Ambulatory Visit (HOSPITAL_COMMUNITY): Payer: Medicare Other

## 2017-11-20 ENCOUNTER — Encounter (HOSPITAL_COMMUNITY): Payer: Self-pay | Admitting: Cardiology

## 2017-11-20 ENCOUNTER — Encounter (HOSPITAL_COMMUNITY)
Admission: RE | Disposition: A | Discharge status: Home / Self Care | Payer: Self-pay | Source: Ambulatory Visit | Attending: Cardiology

## 2017-11-20 ENCOUNTER — Other Ambulatory Visit: Payer: Self-pay

## 2017-11-20 ENCOUNTER — Ambulatory Visit (HOSPITAL_COMMUNITY)
Admission: RE | Admit: 2017-11-20 | Discharge: 2017-11-20 | Disposition: A | Discharge status: Home / Self Care | Payer: Medicare Other | Source: Ambulatory Visit | Attending: Cardiology | Admitting: Cardiology

## 2017-11-20 DIAGNOSIS — I2721 Secondary pulmonary arterial hypertension: Secondary | ICD-10-CM | POA: Diagnosis not present

## 2017-11-20 DIAGNOSIS — I5032 Chronic diastolic (congestive) heart failure: Secondary | ICD-10-CM | POA: Insufficient documentation

## 2017-11-20 DIAGNOSIS — Z7982 Long term (current) use of aspirin: Secondary | ICD-10-CM | POA: Diagnosis not present

## 2017-11-20 DIAGNOSIS — E785 Hyperlipidemia, unspecified: Secondary | ICD-10-CM | POA: Diagnosis not present

## 2017-11-20 DIAGNOSIS — J449 Chronic obstructive pulmonary disease, unspecified: Secondary | ICD-10-CM | POA: Diagnosis not present

## 2017-11-20 DIAGNOSIS — F17210 Nicotine dependence, cigarettes, uncomplicated: Secondary | ICD-10-CM | POA: Diagnosis not present

## 2017-11-20 DIAGNOSIS — R0789 Other chest pain: Secondary | ICD-10-CM | POA: Diagnosis not present

## 2017-11-20 DIAGNOSIS — Z8249 Family history of ischemic heart disease and other diseases of the circulatory system: Secondary | ICD-10-CM | POA: Insufficient documentation

## 2017-11-20 DIAGNOSIS — I2510 Atherosclerotic heart disease of native coronary artery without angina pectoris: Secondary | ICD-10-CM | POA: Insufficient documentation

## 2017-11-20 DIAGNOSIS — I110 Hypertensive heart disease with heart failure: Secondary | ICD-10-CM | POA: Diagnosis not present

## 2017-11-20 DIAGNOSIS — I350 Nonrheumatic aortic (valve) stenosis: Secondary | ICD-10-CM | POA: Insufficient documentation

## 2017-11-20 DIAGNOSIS — R079 Chest pain, unspecified: Secondary | ICD-10-CM

## 2017-11-20 DIAGNOSIS — I080 Rheumatic disorders of both mitral and aortic valves: Secondary | ICD-10-CM

## 2017-11-20 DIAGNOSIS — R0600 Dyspnea, unspecified: Secondary | ICD-10-CM

## 2017-11-20 HISTORY — PX: RIGHT/LEFT HEART CATH AND CORONARY ANGIOGRAPHY: CATH118266

## 2017-11-20 LAB — POCT I-STAT 3, ART BLOOD GAS (G3+)
ACID-BASE EXCESS: 2 mmol/L (ref 0.0–2.0)
BICARBONATE: 27.1 mmol/L (ref 20.0–28.0)
O2 SAT: 97 %
PO2 ART: 94 mmHg (ref 83.0–108.0)
TCO2: 28 mmol/L (ref 22–32)
pCO2 arterial: 44.6 mmHg (ref 32.0–48.0)
pH, Arterial: 7.392 (ref 7.350–7.450)

## 2017-11-20 LAB — POCT I-STAT 3, VENOUS BLOOD GAS (G3P V)
Acid-Base Excess: 1 mmol/L (ref 0.0–2.0)
BICARBONATE: 26.3 mmol/L (ref 20.0–28.0)
Bicarbonate: 25.6 mmol/L (ref 20.0–28.0)
O2 SAT: 67 %
O2 Saturation: 66 %
PCO2 VEN: 44.2 mmHg (ref 44.0–60.0)
PCO2 VEN: 44.6 mmHg (ref 44.0–60.0)
TCO2: 27 mmol/L (ref 22–32)
TCO2: 28 mmol/L (ref 22–32)
pH, Ven: 7.378 (ref 7.250–7.430)
pH, Ven: 7.37 (ref 7.250–7.430)
pO2, Ven: 35 mmHg (ref 32.0–45.0)
pO2, Ven: 36 mmHg (ref 32.0–45.0)

## 2017-11-20 SURGERY — RIGHT/LEFT HEART CATH AND CORONARY ANGIOGRAPHY
Anesthesia: LOCAL

## 2017-11-20 MED ORDER — LIDOCAINE HCL (PF) 1 % IJ SOLN
INTRAMUSCULAR | Status: DC | PRN
  Administered 2017-11-20 (×2): 2 mL via INTRADERMAL

## 2017-11-20 MED ORDER — HEPARIN (PORCINE) IN NACL 1000-0.9 UT/500ML-% IV SOLN
INTRAVENOUS | Status: DC | PRN
  Administered 2017-11-20 (×2): 500 mL

## 2017-11-20 MED ORDER — SODIUM CHLORIDE 0.9% FLUSH: 3.0000 mL | INTRAVENOUS | Status: DC | PRN

## 2017-11-20 MED ORDER — MIDAZOLAM HCL 2 MG/2ML IJ SOLN
INTRAMUSCULAR | Status: AC
  Filled 2017-11-20: qty 2

## 2017-11-20 MED ORDER — SODIUM CHLORIDE 0.9 % WEIGHT BASED INFUSION: 1.0000 mL/kg/h | INTRAVENOUS | Status: DC

## 2017-11-20 MED ORDER — ACETAMINOPHEN 325 MG PO TABS: 650.0000 mg | ORAL_TABLET | ORAL | Status: DC | PRN

## 2017-11-20 MED ORDER — IOHEXOL 350 MG/ML SOLN
INTRAVENOUS | Status: DC | PRN
  Administered 2017-11-20: 50 mL via INTRA_ARTERIAL

## 2017-11-20 MED ORDER — ONDANSETRON HCL 4 MG/2ML IJ SOLN: 4.0000 mg | Freq: Four times a day (QID) | INTRAMUSCULAR | Status: DC | PRN

## 2017-11-20 MED ORDER — MIDAZOLAM HCL 2 MG/2ML IJ SOLN
INTRAMUSCULAR | Status: DC | PRN
  Administered 2017-11-20 (×2): 1 mg via INTRAVENOUS

## 2017-11-20 MED ORDER — FENTANYL CITRATE (PF) 100 MCG/2ML IJ SOLN
INTRAMUSCULAR | Status: AC
  Filled 2017-11-20: qty 2

## 2017-11-20 MED ORDER — VERAPAMIL HCL 2.5 MG/ML IV SOLN
INTRAVENOUS | Status: AC
  Filled 2017-11-20: qty 2

## 2017-11-20 MED ORDER — SODIUM CHLORIDE 0.9 % IV SOLN: 250.0000 mL | INTRAVENOUS | Status: DC | PRN

## 2017-11-20 MED ORDER — VERAPAMIL HCL 2.5 MG/ML IV SOLN
INTRAVENOUS | Status: DC | PRN
  Administered 2017-11-20: 10 mL via INTRA_ARTERIAL

## 2017-11-20 MED ORDER — FUROSEMIDE 20 MG PO TABS: ORAL_TABLET | ORAL | 3 refills | Status: DC

## 2017-11-20 MED ORDER — FENTANYL CITRATE (PF) 100 MCG/2ML IJ SOLN
INTRAMUSCULAR | Status: DC | PRN
  Administered 2017-11-20 (×2): 25 ug via INTRAVENOUS

## 2017-11-20 MED ORDER — LIDOCAINE HCL (PF) 1 % IJ SOLN
INTRAMUSCULAR | Status: AC
  Filled 2017-11-20: qty 30

## 2017-11-20 MED ORDER — HEPARIN SODIUM (PORCINE) 1000 UNIT/ML IJ SOLN
INTRAMUSCULAR | Status: AC
  Filled 2017-11-20: qty 1

## 2017-11-20 MED ORDER — HEPARIN (PORCINE) IN NACL 1000-0.9 UT/500ML-% IV SOLN
INTRAVENOUS | Status: AC
  Filled 2017-11-20: qty 1000

## 2017-11-20 MED ORDER — SODIUM CHLORIDE 0.9% FLUSH: 3.0000 mL | Freq: Two times a day (BID) | INTRAVENOUS | Status: DC

## 2017-11-20 MED ORDER — SODIUM CHLORIDE 0.9 % IV SOLN
INTRAVENOUS | Status: DC
  Administered 2017-11-20: 08:00:00 via INTRAVENOUS

## 2017-11-20 MED ORDER — ASPIRIN 81 MG PO CHEW: 81.0000 mg | CHEWABLE_TABLET | ORAL | Status: DC

## 2017-11-20 MED ORDER — HEPARIN SODIUM (PORCINE) 1000 UNIT/ML IJ SOLN
INTRAMUSCULAR | Status: DC | PRN
  Administered 2017-11-20: 3500 [IU] via INTRAVENOUS

## 2017-11-20 SURGICAL SUPPLY — 13 items
CATH BALLN WEDGE 5F 110CM (CATHETERS) ×2 IMPLANT
CATH INFINITI 5 FR JL3.5 (CATHETERS) ×2 IMPLANT
CATH INFINITI JR4 5F (CATHETERS) ×2 IMPLANT
DEVICE RAD COMP TR BAND LRG (VASCULAR PRODUCTS) ×2 IMPLANT
GLIDESHEATH SLEND SS 6F .021 (SHEATH) ×2 IMPLANT
GUIDEWIRE INQWIRE 1.5J.035X260 (WIRE) ×1 IMPLANT
INQWIRE 1.5J .035X260CM (WIRE) ×2
KIT HEART LEFT (KITS) ×2 IMPLANT
PACK CARDIAC CATHETERIZATION (CUSTOM PROCEDURE TRAY) ×2 IMPLANT
SHEATH GLIDE SLENDER 4/5FR (SHEATH) ×2 IMPLANT
TRANSDUCER W/STOPCOCK (MISCELLANEOUS) ×2 IMPLANT
TUBING CIL FLEX 10 FLL-RA (TUBING) ×2 IMPLANT
WIRE HITORQ VERSACORE ST 145CM (WIRE) ×2 IMPLANT

## 2017-11-20 NOTE — Interval H&P Note (Signed)
History and Physical Interval Note:  11/20/2017 9:10 AM  Alexandra [name]  has presented today for surgery, with the diagnosis of AS  The various methods of treatment have been discussed with the patient and family. After consideration of risks, benefits and other options for treatment, the patient has consented to  Procedure(s): RIGHT/LEFT HEART CATH AND CORONARY ANGIOGRAPHY (N/A) as a surgical intervention .  The patient's history has been reviewed, patient examined, no change in status, stable for surgery.  I have reviewed the patient's chart and labs.  Questions were answered to the patient's satisfaction.     Ledford Goodson [employer]

## 2017-11-20 NOTE — Discharge Instructions (Signed)

## 2017-11-20 NOTE — Progress Notes (Signed)
Orders for TAVR testing placed.

## 2017-12-02 ENCOUNTER — Ambulatory Visit (HOSPITAL_COMMUNITY)
Admission: RE | Admit: 2017-12-02 | Discharge: 2017-12-02 | Disposition: A | Discharge status: Home / Self Care | Payer: Medicare HMO | Source: Ambulatory Visit | Attending: Cardiology | Admitting: Cardiology

## 2017-12-02 VITALS — BP 130/88 | HR 60 | Wt 149.2 lb

## 2017-12-02 DIAGNOSIS — I5032 Chronic diastolic (congestive) heart failure: Secondary | ICD-10-CM | POA: Diagnosis not present

## 2017-12-02 DIAGNOSIS — Z79899 Other long term (current) drug therapy: Secondary | ICD-10-CM | POA: Diagnosis not present

## 2017-12-02 DIAGNOSIS — Z7951 Long term (current) use of inhaled steroids: Secondary | ICD-10-CM | POA: Insufficient documentation

## 2017-12-02 DIAGNOSIS — J449 Chronic obstructive pulmonary disease, unspecified: Secondary | ICD-10-CM | POA: Insufficient documentation

## 2017-12-02 DIAGNOSIS — I2510 Atherosclerotic heart disease of native coronary artery without angina pectoris: Secondary | ICD-10-CM | POA: Insufficient documentation

## 2017-12-02 DIAGNOSIS — F17210 Nicotine dependence, cigarettes, uncomplicated: Secondary | ICD-10-CM | POA: Insufficient documentation

## 2017-12-02 DIAGNOSIS — I350 Nonrheumatic aortic (valve) stenosis: Secondary | ICD-10-CM | POA: Insufficient documentation

## 2017-12-02 DIAGNOSIS — E785 Hyperlipidemia, unspecified: Secondary | ICD-10-CM | POA: Insufficient documentation

## 2017-12-02 DIAGNOSIS — I110 Hypertensive heart disease with heart failure: Secondary | ICD-10-CM | POA: Diagnosis not present

## 2017-12-02 DIAGNOSIS — I272 Pulmonary hypertension, unspecified: Secondary | ICD-10-CM | POA: Diagnosis present

## 2017-12-02 DIAGNOSIS — Z8249 Family history of ischemic heart disease and other diseases of the circulatory system: Secondary | ICD-10-CM | POA: Insufficient documentation

## 2017-12-02 DIAGNOSIS — Z7982 Long term (current) use of aspirin: Secondary | ICD-10-CM | POA: Insufficient documentation

## 2017-12-02 LAB — BASIC METABOLIC PANEL
Anion gap: 7 (ref 5–15)
BUN: 12 mg/dL (ref 8–23)
CALCIUM: 9.1 mg/dL (ref 8.9–10.3)
CO2: 28 mmol/L (ref 22–32)
Chloride: 103 mmol/L (ref 98–111)
Creatinine, Ser: 1 mg/dL (ref 0.44–1.00)
GFR calc Af Amer: >60 mL/min (ref >60)
GFR calc non Af Amer: 57 mL/min — ABNORMAL LOW (ref >60)
GLUCOSE: 93 mg/dL (ref 70–99)
POTASSIUM: 4.2 mmol/L (ref 3.5–5.1)
Sodium: 138 mmol/L (ref 135–145)

## 2017-12-02 LAB — TSH: TSH: 1.642 u[IU]/mL (ref 0.350–4.500)

## 2017-12-02 LAB — CBC
HCT: 40.7 % (ref 36.0–46.0)
Hemoglobin: 12.7 g/dL (ref 12.0–15.0)
MCH: 29.8 pg (ref 26.0–34.0)
MCHC: 31.2 g/dL (ref 30.0–36.0)
MCV: 95.5 fL (ref 78.0–100.0)
PLATELETS: 247 10*3/uL (ref 150–400)
RBC: 4.26 MIL/uL (ref 3.87–5.11)
RDW: 13.8 % (ref 11.5–15.5)
WBC: 12.7 10*3/uL — ABNORMAL HIGH (ref 4.0–10.5)

## 2017-12-02 MED ORDER — ROSUVASTATIN CALCIUM 10 MG PO TABS: 10.0000 mg | ORAL_TABLET | Freq: Every day | ORAL | 3 refills | Status: DC

## 2017-12-02 NOTE — Patient Instructions (Signed)
Labs today (will call for abnormal results, otherwise no news is good news)  START Crestor 10 mg Once Daily  Labs and Follow up in 3 months.   Please call your orthopedic MD regarding your knee.  Request for appointment as soon as possible.

## 2017-12-03 NOTE — Progress Notes (Signed)
PCP: Joaquin Courts Cardiology: Dr. Ellyn Hack HF Cardiology: Dr. Aundra Dubin  67 y.o. with history of COPD, aortic stenosis, diastolic CHF/pulmonary hypertension was referred by Dr. Ellyn Hack for evaluation of pulmonary hypertension.  She is an active smoker, now down to 1-2 cigs/day (heavier in the past).  However, PFTs in 4/19 showed only mild obstruction but DLCO was severely decreased.  Echo in 8/18 showed EF 50-55% with moderate aortic stenosis.  RHC/LHC done 9/18 showed nonobstructive CAD, elevated PCWP and severe mixed pulmonary venous/pulmonary arterial hypertension.  Aortic stenosis appeared moderate at that time.    At initial appointment, she was volume overloaded, and I increased her Lasix.  Weight came down, but she is still very limited.  She is still short of breath walking 75 yards.  She is short of breath walking up inclines/stairs.  Fatigue/poor energy.  No syncope/lightheadedness.  No chest pain.  She has right knee pain, recently had a steroid injection in the right knee with fluid removal.  However, since then, the effusion in the right knee has worsened and pain is worse as well.  She denies fever.      Echo was done in 8/19 and reviewed.  EF remains 55-60% but aortic stenosis appears to have progressed to severe range.  RHC/LHC in 8/19 showed nonobstructive CAD, normal filling pressures, mild pulmonary hypertension. She has seen Dr. Angelena Form with plans for TAVR.   Labs (9/18): creatinine 0.73 Labs (3/19): LDL 166 Labs (7/19): K 3.7, creatinine 1.17, BNP 1919 Labs (8/19): K 4, creatinine 1.04  PMH: 1. COPD: She is an active smoker.  - PFTs (4/19): FVC 78%, FEV1 78%, ratio 100%, TLC 87%, DLCO 50%. Mild obstruction but severely decreased DLCO.  2. HTN 3. Hyperlipidemia 4. H/o atrial tachycardia 5. Aortic stenosis: Moderate on 8/18 echo.  Moderate on 9/18 LHC.  - Severe AS on 8/19 echo.  6. Chronic diastolic CHF with pulmonary hypertension:  - Echo (8/18): EF 50-55%, mild LVH,  grade II diastolic dysfunction, PASP 54 mmHg, modrerate AS with mean gradient 31 mmHg.  - LHC/RHC (9/18): 60% dLAD, 40% ostial RCA, 40% OM3. Mean RA 8, PA 71/32 mean 52, mean PCWP 27, PVR 5.7 WU => mixed pulmonary venous and pulmonary arterial hypertension. CI 2.44.  - Echo (8/19): EF 55-60%, moderate LVH, severe AS with mean gradient 32 mmHg/peak 62 mmHg, AVA 0.8 cm^2.  - LHC/RHC (8/19): 40% dLAD, 30% pRCA, mean RA 4, PA 39/8, mean PCWP 5, CI 2.45, PVR 3.78 WU.  7. LBBB  SH: Retired, lives with husband, smokes 1-2 cigs/day (heavier in the past).   FH: Parents with CAD (MIs), brother with MI at 81.    ROS: All systems reviewed and negative except as per HPI  Current Outpatient Medications  Medication Sig Dispense Refill  . aspirin 81 MG tablet Take 81 mg by mouth daily.     . Azelastine HCl 0.15 % SOLN Place 2 sprays into the nose 2 (two) times daily as needed (allergies). 30 mL PRN  . bisoprolol-hydrochlorothiazide (ZIAC) 10-6.25 MG tablet Take 1 tablet by mouth daily. 90 tablet 0  . cetirizine (ZYRTEC) 10 MG tablet Take 1 tablet (10 mg total) by mouth at bedtime. (Patient taking differently: Take 10 mg by mouth daily as needed for allergies. ) 30 tablet PRN  . diltiazem (CARDIZEM CD) 120 MG 24 hr capsule Take 1 capsule (120 mg total) daily after supper by mouth. 90 capsule 3  . fluticasone (FLOVENT HFA) 220 MCG/ACT inhaler Inhale 2 puffs into the  lungs 2 (two) times daily. (Patient taking differently: Inhale 2 puffs into the lungs 2 (two) times daily as needed (asthma). ) 1 Inhaler 12  . furosemide (LASIX) 20 MG tablet Take 2 tabs in AM 90 tablet 3  . losartan (COZAAR) 100 MG tablet Take 1 tablet (100 mg total) by mouth daily. 90 tablet 3  . montelukast (SINGULAIR) 10 MG tablet Take 1 tablet (10 mg total) by mouth at bedtime. (Patient taking differently: Take 10 mg by mouth daily as needed (at bedtime if needed for allergies). ) 30 tablet 3  . oxybutynin (DITROPAN-XL) 10 MG 24 hr tablet TAKE  1 TABLET BY MOUTH ONCE DAILY AT BEDTIME (Patient taking differently: Take 10 mg by mouth at bedtime. ) 90 tablet 0  . oxyCODONE-acetaminophen (PERCOCET) 10-325 MG tablet Take 1 tablet by mouth every 4 (four) hours as needed for pain.     . potassium chloride SA (K-DUR,KLOR-CON) 20 MEQ tablet Take 1 tablet (20 mEq total) by mouth daily. 30 tablet 6  . Vitamin D, Ergocalciferol, (DRISDOL) 50000 units CAPS capsule Take 1 capsule (50,000 Units total) by mouth every 7 (seven) days. 30 capsule 1  . rosuvastatin (CRESTOR) 10 MG tablet Take 1 tablet (10 mg total) by mouth daily. 90 tablet 3   No current facility-administered medications for this encounter.    BP 130/88   Pulse 60   Wt 67.7 kg (149 lb 3.2 oz)   SpO2 98%   BMI 24.08 kg/m  General: NAD Neck: No JVD, no thyromegaly or thyroid nodule.  Lungs: Clear to auscultation bilaterally with normal respiratory effort. CV: Nondisplaced PMI.  Heart regular S1/S2, no S3/S4, 3/6 crescendo-decrescendo systolic murmur RUSB with soft S2.  No peripheral edema.  No carotid bruit.  Normal pedal pulses.  Abdomen: Soft, nontender, no hepatosplenomegaly, no distention.  Skin: Intact without lesions or rashes.  Neurologic: Alert and oriented x 3.  Psych: Normal affect. Extremities: No clubbing or cyanosis.  HEENT: Normal.  MSK: Right knee effusion  Assessment/Plan: 1. CAD: Nonobstructive on 8/19 cath.  - Continue ASA 81.  - Restart Crestor 10 mg daily, check lipids/LFTs in 2 months.  2. COPD: Active smoker,1-2 cigs/day.  PFTs showed only mild obstruction but DLCO was severely decreased.  - I again strongly encouraged her to stop smoking entirely. Would like her to be off cigarettes before she goes for TAVR.  3. Chronic diastolic CHF with pulmonary hypertension: Echo in 8/19 with EF 55-60%, moderate LVH.  Volume status is significantly improved on increased Lasix but she still has significant dyspnea (NYHA class III).  Think this is due to severe AS.   Filling pressures were normal on RHC in 8/19.  - Continue Lasix 40 daily.  BMET today.   4. Aortic stenosis: Moderate AS on workup in 8/18.  However, echo from 8/19 was reviewed and AS appears to be severe.  I suspect this is why she is still short of breath and fatigued despite good diuresis recently.  Exam is consistent with severe AS. RHC shows that she is well-compensated.  - She is now being evaluated for TAVR.  5. Pulmonary hypertension: Mixed pulmonary arterial and pulmonary venous hypertension on RHC in 9/18.  PH was severe.  Given COPD, would be concerned for group 3 PH, but PFTs did not show severe COPD.  Therefore, group 1 component to [facility] still remains a concern. Repeat RHC after diuresis in 8/19 showed only mild pulmonary hypertension, PVR 3.78 WU.  - No treatment with pulmonary  vasodilators at this time.  6. HTN: BP controlled.    Followup in 2 months  Alexandra [name] 12/03/2017

## 2017-12-10 ENCOUNTER — Ambulatory Visit (HOSPITAL_COMMUNITY): Payer: Medicare HMO

## 2017-12-10 ENCOUNTER — Encounter (HOSPITAL_COMMUNITY): Payer: Medicare Other

## 2017-12-10 ENCOUNTER — Other Ambulatory Visit (HOSPITAL_COMMUNITY): Payer: Medicare Other

## 2017-12-10 ENCOUNTER — Ambulatory Visit (HOSPITAL_COMMUNITY): Payer: Medicare Other

## 2017-12-10 ENCOUNTER — Ambulatory Visit (HOSPITAL_COMMUNITY)
Admission: RE | Admit: 2017-12-10 | Discharge: 2017-12-10 | Disposition: A | Discharge status: Home / Self Care | Payer: Medicare HMO | Source: Ambulatory Visit | Attending: Cardiovascular Disease | Admitting: Cardiovascular Disease

## 2017-12-10 ENCOUNTER — Ambulatory Visit (HOSPITAL_COMMUNITY): Admission: RE | Admit: 2017-12-10 | Payer: Medicare HMO | Source: Ambulatory Visit

## 2017-12-10 DIAGNOSIS — R0600 Dyspnea, unspecified: Secondary | ICD-10-CM | POA: Diagnosis not present

## 2017-12-10 DIAGNOSIS — R079 Chest pain, unspecified: Secondary | ICD-10-CM | POA: Diagnosis not present

## 2017-12-10 DIAGNOSIS — I708 Atherosclerosis of other arteries: Secondary | ICD-10-CM | POA: Diagnosis not present

## 2017-12-10 DIAGNOSIS — I350 Nonrheumatic aortic (valve) stenosis: Secondary | ICD-10-CM

## 2017-12-10 NOTE — Progress Notes (Signed)
Bilaeral carotis=d duplex completed - preliminary results. Right 1% to 39 % ICA stenosis. Vertebral artery flow is antegrade. Left -40% to 59% ICA stenosis. Vertebral artey flow is " To - Fro " Alexandra [name],RVS  12/10/2017 4:00 PM

## 2017-12-11 ENCOUNTER — Telehealth: Payer: Self-pay

## 2017-12-11 NOTE — Telephone Encounter (Signed)
Rescheduled patient's TAVR scans for 9/26. Patient understands to follow instructions as previously instructed.  She understands new time and date. She was grateful for call and agrees with treatment plan.

## 2017-12-12 ENCOUNTER — Ambulatory Visit: Payer: Medicare Other | Admitting: Cardiology

## 2017-12-18 ENCOUNTER — Ambulatory Visit (HOSPITAL_COMMUNITY)
Admission: RE | Admit: 2017-12-18 | Discharge: 2017-12-18 | Disposition: A | Discharge status: Home / Self Care | Payer: Medicare HMO | Source: Ambulatory Visit | Attending: Cardiovascular Disease | Admitting: Cardiovascular Disease

## 2017-12-18 DIAGNOSIS — R918 Other nonspecific abnormal finding of lung field: Secondary | ICD-10-CM | POA: Diagnosis not present

## 2017-12-18 DIAGNOSIS — I350 Nonrheumatic aortic (valve) stenosis: Secondary | ICD-10-CM | POA: Diagnosis not present

## 2017-12-18 DIAGNOSIS — R079 Chest pain, unspecified: Secondary | ICD-10-CM | POA: Insufficient documentation

## 2017-12-18 DIAGNOSIS — I2510 Atherosclerotic heart disease of native coronary artery without angina pectoris: Secondary | ICD-10-CM | POA: Insufficient documentation

## 2017-12-18 DIAGNOSIS — K5730 Diverticulosis of large intestine without perforation or abscess without bleeding: Secondary | ICD-10-CM | POA: Diagnosis not present

## 2017-12-18 DIAGNOSIS — R0600 Dyspnea, unspecified: Secondary | ICD-10-CM | POA: Insufficient documentation

## 2017-12-18 DIAGNOSIS — I700 Atherosclerosis of aorta: Secondary | ICD-10-CM | POA: Diagnosis not present

## 2017-12-18 MED ORDER — IOPAMIDOL (ISOVUE-370) INJECTION 76%
100.0000 mL | Freq: Once | INTRAVENOUS | Status: AC | PRN
  Administered 2017-12-18: 100 mL via INTRAVENOUS

## 2017-12-24 ENCOUNTER — Institutional Professional Consult (permissible substitution) (INDEPENDENT_AMBULATORY_CARE_PROVIDER_SITE_OTHER): Payer: Medicare HMO | Admitting: Surgery

## 2017-12-24 ENCOUNTER — Ambulatory Visit: Payer: Medicare HMO | Attending: Cardiovascular Disease | Admitting: Physical Therapy

## 2017-12-24 ENCOUNTER — Other Ambulatory Visit: Payer: Self-pay

## 2017-12-24 ENCOUNTER — Encounter: Payer: Self-pay | Admitting: Physical Therapy

## 2017-12-24 ENCOUNTER — Encounter: Payer: Self-pay | Admitting: Surgery

## 2017-12-24 VITALS — BP 162/79 | HR 68 | Resp 16 | Ht 65.0 in | Wt 150.2 lb

## 2017-12-24 DIAGNOSIS — I350 Nonrheumatic aortic (valve) stenosis: Secondary | ICD-10-CM

## 2017-12-24 DIAGNOSIS — R262 Difficulty in walking, not elsewhere classified: Secondary | ICD-10-CM | POA: Diagnosis present

## 2017-12-24 NOTE — Therapy (Signed)
[place], [place], [address] Phone: [phone]   Fax:  [phone]  Physical Therapy Evaluation  Patient Details  Name: Alexandra [name] MRN: [id number] Date of Birth: Feb 22, 1951 Referring Provider (PT): Burnell Blanks, MD   Encounter Date: 12/24/2017  PT End of Session - 12/24/17 1200    Visit Number  1    Authorization Type  TAVR    PT Start Time  1150    PT Stop Time  1222    PT Time Calculation (min)  32 min    Activity Tolerance  Patient tolerated treatment well    Behavior During Therapy  [facility] for tasks assessed/performed       Past Medical History:  Diagnosis Date  . COPD (chronic obstructive pulmonary disease) ([place]) 05/22/2016  . Coronary artery disease, non-occlusive 11/28/2016   R&LHC: 65% distal LAD lesion -- likely not angiographically significant->medical management. Normal LVEF. Moderately elevated LVEDP. I aortic valve gradient in the Cath Lab, moderate aortic stenosis. This would suggest that the stenosis is on the moderate side of moderate to severe.  Severe pulmonary hypertension (likely mixed).  AVA 0.96 cm; P-P gradient ~ 30 mmHg, Mean ~24.5-27.5 (Moderate AS)   . GERD (gastroesophageal reflux disease)   . Hx of adenomatous colonic polyps 10/19/2015  . Hyperlipidemia   . Hypertension   . Moderate mitral regurgitation by prior echocardiography 10/2016   Echocardiogram revealed moderate mitral regurgitation  . Moderate to severe aortic stenosis 10/2016   Echocardiogram showed moderate-severe left ear with a mean-peak gradient of 31 and 64 mmHg.  . Nodule of right lung 07/05/2016   9 mm irregular, possibly spiculated, RIGHT lung base laterally. Rather than repeat CT scan in 3 months, patient elects to consider options for tissue sampling. Resolved on follow-up CT 10/23/2016.  . Pulmonary hypertension associated Aortic Stenosis, Mitral Regurgitation & COPD    Peak PAP by Echo ~54 mmHg;   by Cath PAP/Mean 71/32 mmHg, 53 mmHg (with PCWP & LVEDP ~24-26 mmHg); TPG ~26 mmHg    Past Surgical History:  Procedure Laterality Date  . COLONOSCOPY  2003, 2017  . CTA Chest  09/2016   Thoracic Aortic Ca2+ w/o dilation.  Coronary Calcification noted. PA normal. Mild Emphysematous changes w/ mild LLL scarring.    [name] FRACTURE SURGERY     2007 -right tib /fib fracture ----07/2009 right femur fx after a fall  . FRACTURE SURGERY Right 2011   Femur  . PERIPHERAL VASCULAR CATHETERIZATION N/A 08/09/2014   Procedure: Aortic Arch Angiography;  Surgeon: Serafina Mitchell, MD;  Location: [place] [facility];  Service: Cardiovascular: Type 1 Arch. No significant stenosis, aneurysmal degeneration or dissection.  No luminal irregularity seen in R or L SubClavian, Brachial or Radial A. Chronic distal ulnar A occlusion [place].  [name] PERIPHERAL VASCULAR CATHETERIZATION Right 08/09/2014   Procedure: Upper Extremity Angiography;  Surgeon: Serafina Mitchell, MD;  Location: [place] [facility];  Service: Cardiovascular;  Laterality: Right;  . RIGHT/LEFT HEART CATH AND CORONARY ANGIOGRAPHY N/A 12/06/2016   Procedure: RIGHT/LEFT HEART CATH AND CORONARY ANGIOGRAPHY;  Surgeon: Leonie Man, MD;  Location: [facility]: Cor Angio: 65% dLAD (Med Rx). AVA 0.96 cm; P-P gradient ~ 30 mmHg, Mean ~24.5-27.5 (Mod AS); RHC #s: RAP 8 mmHg, RVP/EDP: 71/8/15 mmHg, PCWP: 21-24 mmHg, PAP/mean: 71/32/52 mmHg = Severe Mixed Pulmonary HTN; LVP/EDP 205/17/26 mmHg ; CO/CI by Fick: 4.35, 2.44 - mildly reduced  . RIGHT/LEFT HEART CATH AND CORONARY  ANGIOGRAPHY N/A 11/20/2017   Procedure: RIGHT/LEFT HEART CATH AND CORONARY ANGIOGRAPHY;  Surgeon: Larey Dresser, MD;  Location: [place] [facility];  Service: Cardiovascular;  Laterality: N/A;  . TRANSTHORACIC ECHOCARDIOGRAM  10/30/2016   Mild Concentric LVH. EF 55-60%. No RWMA, Gr 2 DD. Mod-Severe AS (mean-peak Gradient 31 mmHG - 64 mmHg), Mod MR. Mod LA dilation, Mod PA HTN - peak pressure ~54  mmHg). = Progression of AS from 2014  . ULTRASOUND GUIDANCE FOR VASCULAR ACCESS  08/09/2014   Procedure: Ultrasound Guidance For Vascular Access;  Surgeon: Serafina Mitchell, MD;  Location: [place] [facility];  Service: Cardiovascular;;    There were no vitals filed for this visit.   Subjective Assessment - 12/24/17 1159    Subjective  I don't have a whole lot of energy. I get out of breath a little when I walk but I dont do much. I need surgery on my Lt knee after my heart surgery.     Currently in Pain?  Yes    Pain Score  4     Pain Location  Knee    Pain Orientation  Left    Pain Descriptors / Indicators  Aching    Pain Type  Chronic pain    Aggravating Factors   walking    Pain Relieving Factors  rest         [facility] PT Assessment - 12/24/17 0001      Assessment   Medical Diagnosis  severe aortic stenosis    Referring Provider (PT)  Burnell Blanks, MD    Hand Dominance  Right      Precautions   Precautions  None      Restrictions   Weight Bearing Restrictions  No      Balance Screen   Has the patient fallen in the past 6 months  No      [place] residence    Additional Comments  stairs at home- I avoid them      Prior Function   Level of Independence  Independent    Vocation Requirements  not working      Cognition   Overall Cognitive Status  Within Functional Limits for tasks assessed      Sensation   Additional Comments  WFL      ROM / Strength   AROM / PROM / Strength  AROM;Strength      AROM   Overall AROM Comments  WFL      Strength   Overall Strength Comments  WFL, pain with LE testing    Strength Assessment Site  Hand    Right/Left hand  Right;Left    Right Hand Grip (lbs)  60    Left Hand Grip (lbs)  58       [facility] Pre-Surgical Assessment - 12/24/17 0001    5 Meter Walk Test- trial 1  8 sec    5 Meter Walk Test- trial 2  7 sec.     5 Meter Walk Test- trial 3  7 sec.    5 meter walk test  average  7.33 sec    4 Stage Balance Test Position  2    Comment  unable without using hands    6 Minute Walk- Baseline  yes    BP (mmHg)  137/76    HR (bpm)  53    02 Sat (%RA)  96 %    Modified Borg Scale for Dyspnea  0-  Nothing at all    Perceived Rate of Exertion (Borg)  6-    6 Minute Walk Post Test  yes    BP (mmHg)  151/71    HR (bpm)  79    02 Sat (%RA)  94 %    Modified Borg Scale for Dyspnea  0.5- Very, very slight shortness of breath    Perceived Rate of Exertion (Borg)  11- Fairly light    Aerobic Endurance Distance Walked  692    Endurance additional comments  61% disability              Objective measurements completed on examination: See above findings.      Clinical Impression Statement: Pt is a 67 yo F presenting to OP PT for evaluation prior to possible TAVR surgery due to severe aortic stenosis. Pt reports onset of SOB for a while. Symptoms are limiting daily walking minimally but feels more limited by knee pain right now. Pt presents with WFL ROM and strength and complains of musculoskeletal pain in left knee. Pt ambulated  feet in 370 ft before requesting a seated rest break lasting 45 seconds. At time of rest, patient's HR was 79 bpm and O2 was 94% on room air. Pt reported 0.5/10 shortness of breath on modified scale for dyspnea. Pt able to resume after rest and ambulate an additional 322 feet. Pt ambulated a total of 962 feet in 6 minute walk and reported 0.5/10 SOB on modified scale for dyspena and 11/20 RPE on Borg's perceived exertion and pain scale at the end of the walk. During the 6 minute walk test, patient's HR increased to 79 and O2 saturation decreased to 94%. Based on the Short Physical Performance Battery, patient has a frailty rating of 5/12 with </= 5/12 considered frail.     Visit Diagnosis: Difficulty in walking, not elsewhere classified     Problem List Patient Active Problem List   Diagnosis Date Noted  . Medication management  03/14/2017  . PAT (paroxysmal atrial tachycardia) ([place]) 02/07/2017  . Chronic fatigue 02/07/2017  . Pulmonary hypertension: Combined secondary as well as lung disease 12/28/2016  . Coronary artery disease, non-occlusive 11/28/2016  . DOE (dyspnea on exertion) 11/28/2016  . Chest pain with moderate risk for cardiac etiology 11/28/2016  . Smoker 06/25/2016  . COPD (chronic obstructive pulmonary disease) ([place]) 05/22/2016  . Urinary frequency 02/20/2016  . Right knee DJD 12/09/2015  . Hx of adenomatous colonic polyps 10/19/2015  . Osteopenia 08/15/2015  . Femoral neck fracture ([place]) 07/16/2015  . Chronic pain syndrome 07/16/2015  . Primary localized osteoarthrosis of pelvic region 07/16/2015  . Anxiety state 07/16/2015  . Palpitations 07/16/2015  . Vitamin D deficiency 05/16/2015  . Aortic stenosis with mitral and aortic insufficiency 07/29/2014  . Essential hypertension 07/29/2014  . Hyperlipidemia with target low density lipoprotein (LDL) cholesterol less than 70 mg/dL 07/29/2014   Katerine Morua C. Sharde Gover PT, DPT 12/24/17 12:31 PM   [place], [place], [address] Phone: [phone]   Fax:  [phone]  Name: Alexandra [name] MRN: [id number] Date of Birth: 09-28-1950

## 2017-12-26 ENCOUNTER — Encounter: Payer: Self-pay | Admitting: Surgery

## 2017-12-26 NOTE — Progress Notes (Signed)
Patient ID: Alexandra [name], female   DOB: 12-Jan-1951, 67 y.o.   MRN: [id number]  [facility] CONSULTATION REPORT  Referring Provider is Larey Dresser, MD PCP is Patient, [name]  Chief Complaint  Patient presents with  . Aortic Stenosis    TAVR EVAL to review all required studies completed    HPI:  Patient is a 67 year old woman with a history of hypertension, hyperlipidemia, ongoing smoking and COPD, pulmonary hypertension, and diastolic congestive heart failure who has known moderate aortic stenosis.  An echo in 10/2016 showed a mean aortic valve gradient of 31 mmHg with a dimensionless index of 0.24.  She was seen in our office at that time by Dr. Darcey Nora who was seeing her for a right lower lobe nodular density which resolved on subsequent CT scan.  She was not having any clear symptoms of congestive heart failure and was therefore referred back to cardiology for follow-up.  Over the past several months she has developed exertional fatigue and shortness of breath as well as two-pillow orthopnea.  She has been followed by Dr. Aundra Dubin and was treated for volume overload with Lasix.  She had a follow-up echocardiogram on 10/31/2017 which showed a mean valve gradient of 32 mmHg with a peak of 62 mmHg.  The dimensionless index was 0.28.  There was mild to moderate aortic insufficiency with a pressure half-time of 515 ms.  Left ventricular ejection fraction was 55 to 60% with grade 1 diastolic dysfunction.  There is moderate left ventricular hypertrophy.  She subsequently underwent right and left heart cath by Dr. Aundra Dubin on 11/20/2017.  This showed nonobstructive coronary disease.  There was mild pulmonary hypertension with a pulmonary vascular resistance of only 3.78 Wood units.  Right heart pressures were low.  The patient is retired and lives with her husband.  She was a heavy smoker in the past but states she smokes  about 1 to 2 cigarettes/day now.  She is able to do her normal daily activities in the house without difficulty although she does not slower.  Walking up inclines or lifting heavier objects gives her significant shortness of breath.  Past Medical History:  Diagnosis Date  . COPD (chronic obstructive pulmonary disease) ([place]) 05/22/2016  . Coronary artery disease, non-occlusive 11/28/2016   R&LHC: 65% distal LAD lesion -- likely not angiographically significant->medical management. Normal LVEF. Moderately elevated LVEDP. I aortic valve gradient in the Cath Lab, moderate aortic stenosis. This would suggest that the stenosis is on the moderate side of moderate to severe.  Severe pulmonary hypertension (likely mixed).  AVA 0.96 cm; P-P gradient ~ 30 mmHg, Mean ~24.5-27.5 (Moderate AS)   . GERD (gastroesophageal reflux disease)   . Hx of adenomatous colonic polyps 10/19/2015  . Hyperlipidemia   . Hypertension   . Moderate mitral regurgitation by prior echocardiography 10/2016   Echocardiogram revealed moderate mitral regurgitation  . Moderate to severe aortic stenosis 10/2016   Echocardiogram showed moderate-severe left ear with a mean-peak gradient of 31 and 64 mmHg.  . Nodule of right lung 07/05/2016   9 mm irregular, possibly spiculated, RIGHT lung base laterally. Rather than repeat CT scan in 3 months, patient elects to consider options for tissue sampling. Resolved on follow-up CT 10/23/2016.  . Pulmonary hypertension associated Aortic Stenosis, Mitral Regurgitation & COPD    Peak PAP by Echo ~54 mmHg;  by Cath PAP/Mean 71/32 mmHg, 53 mmHg (with PCWP & LVEDP ~24-26  mmHg); TPG ~26 mmHg    Past Surgical History:  Procedure Laterality Date  . COLONOSCOPY  2003, 2017  . CTA Chest  09/2016   Thoracic Aortic Ca2+ w/o dilation.  Coronary Calcification noted. PA normal. Mild Emphysematous changes w/ mild LLL scarring.    [name] FRACTURE SURGERY     2007 -right tib /fib fracture ----07/2009 right femur fx  after a fall  . FRACTURE SURGERY Right 2011   Femur  . PERIPHERAL VASCULAR CATHETERIZATION N/A 08/09/2014   Procedure: Aortic Arch Angiography;  Surgeon: Serafina Mitchell, MD;  Location: [facility];  Service: Cardiovascular: Type 1 Arch. No significant stenosis, aneurysmal degeneration or dissection.  No luminal irregularity seen in R or L SubClavian, Brachial or Radial A. Chronic distal ulnar A occlusion [place].  [name] PERIPHERAL VASCULAR CATHETERIZATION Right 08/09/2014   Procedure: Upper Extremity Angiography;  Surgeon: Serafina Mitchell, MD;  Location: [facility];  Service: Cardiovascular;  Laterality: Right;  . RIGHT/LEFT HEART CATH AND CORONARY ANGIOGRAPHY N/A 12/06/2016   Procedure: RIGHT/LEFT HEART CATH AND CORONARY ANGIOGRAPHY;  Surgeon: Leonie Man, MD;  Location: [facility]: Cor Angio: 65% dLAD (Med Rx). AVA 0.96 cm; P-P gradient ~ 30 mmHg, Mean ~24.5-27.5 (Mod AS); RHC #s: RAP 8 mmHg, RVP/EDP: 71/8/15 mmHg, PCWP: 21-24 mmHg, PAP/mean: 71/32/52 mmHg = Severe Mixed Pulmonary HTN; LVP/EDP 205/17/26 mmHg ; CO/CI by Fick: 4.35, 2.44 - mildly reduced  . RIGHT/LEFT HEART CATH AND CORONARY ANGIOGRAPHY N/A 11/20/2017   Procedure: RIGHT/LEFT HEART CATH AND CORONARY ANGIOGRAPHY;  Surgeon: Larey Dresser, MD;  Location: [facility];  Service: Cardiovascular;  Laterality: N/A;  . TRANSTHORACIC ECHOCARDIOGRAM  10/30/2016   Mild Concentric LVH. EF 55-60%. No RWMA, Gr 2 DD. Mod-Severe AS (mean-peak Gradient 31 mmHG - 64 mmHg), Mod MR. Mod LA dilation, Mod PA HTN - peak pressure ~54 mmHg). = Progression of AS from 2014  . ULTRASOUND GUIDANCE FOR VASCULAR ACCESS  08/09/2014   Procedure: Ultrasound Guidance For Vascular Access;  Surgeon: Serafina Mitchell, MD;  Location: [facility];  Service: Cardiovascular;;    Family History  Problem Relation Age of Onset  . Hypertension Mother   . Heart disease Mother        She is unaware of the details  . Heart disease Father          Unaware of details  . Heart attack Brother   . Colon cancer Neg Hx   . Colon polyps Neg Hx   . Esophageal cancer Neg Hx   . Rectal cancer Neg Hx   . Stomach cancer Neg Hx     Social History   Socioeconomic History  . Marital status: Married    Spouse name: [name]  . Number of children: 3  . Years of education: 12th grade  . Highest education level: Not on file  Occupational History  . 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 Financial resource strain: Not on file  . Food insecurity:    Worry: Not on file    Inability: Not on file  . Transportation needs:    Medical: Not on file    Non-medical: Not on file  Tobacco Use  . Smoking status: Light Tobacco Smoker    Years: 20.00    Types: Cigarettes  . Smokeless tobacco: Never Used  . Tobacco comment: has quit before- smokes 2 cigs a  day   Substance and Sexual Activity  . Alcohol use: No    Alcohol/week: 0.0 standard drinks  . Drug use: No    Comment: +cocaine in urine 12/19/2014  . Sexual activity: Yes    Partners: Male  Lifestyle  . Physical activity:    Days per week: Not on file    Minutes per session: Not on file  . Stress: Not on file  Relationships  . Social connections:    Talks on phone: Not on file    Gets together: Not on file    Attends religious service: Not on file    Active member of club or organization: Not on file    Attends meetings of clubs or organizations: Not on file    Relationship status: Not on file  . Intimate partner violence:    Fear of current or ex partner: Not on file    Emotionally abused: Not on file    Physically abused: Not on file    Forced sexual activity: Not on file  Other Topics Concern  . Not on file  Social History Narrative   Lives with her husband and her middle son.   Her two older sons are products of a previous relationship.   Her youngest son is from her current  marriage.   Oldest and youngest sons live nearby.   She has total of 3 children and 6 grandchildren with 2 great-grandchildren.   She does not exercise because she has pain in her right leg.    Current Outpatient Medications  Medication Sig Dispense Refill  . aspirin 81 MG tablet Take 81 mg by mouth daily.     . Azelastine HCl 0.15 % SOLN Place 2 sprays into the nose 2 (two) times daily as needed (allergies). 30 mL PRN  . bisoprolol-hydrochlorothiazide (ZIAC) 10-6.25 MG tablet Take 1 tablet by mouth daily. 90 tablet 0  . cetirizine (ZYRTEC) 10 MG tablet Take 1 tablet (10 mg total) by mouth at bedtime. (Patient taking differently: Take 10 mg by mouth 3 times/day as needed-between meals & bedtime. ) 30 tablet PRN  . diltiazem (CARDIZEM CD) 120 MG 24 hr capsule Take 1 capsule (120 mg total) daily after supper by mouth. 90 capsule 3  . fluticasone (FLOVENT HFA) 220 MCG/ACT inhaler Inhale 2 puffs into the lungs 2 (two) times daily. (Patient taking differently: Inhale 2 puffs into the lungs 2 (two) times daily as needed (asthma). ) 1 Inhaler 12  . furosemide (LASIX) 20 MG tablet Take 2 tabs in AM 90 tablet 3  . losartan (COZAAR) 100 MG tablet Take 1 tablet (100 mg total) by mouth daily. 90 tablet 3  . montelukast (SINGULAIR) 10 MG tablet Take 1 tablet (10 mg total) by mouth at bedtime. (Patient taking differently: Take 10 mg by mouth daily as needed (at bedtime if needed for allergies). ) 30 tablet 3  . oxybutynin (DITROPAN-XL) 10 MG 24 hr tablet TAKE 1 TABLET BY MOUTH ONCE DAILY AT BEDTIME (Patient taking differently: Take 10 mg by mouth at bedtime. ) 90 tablet 0  . oxyCODONE-acetaminophen (PERCOCET) 10-325 MG tablet Take 1 tablet by mouth every 4 (four) hours as needed for pain.     . potassium chloride SA (K-DUR,KLOR-CON) 20 MEQ tablet Take 1 tablet (20 mEq total) by mouth daily. 30 tablet 6  . rosuvastatin (CRESTOR) 10 MG tablet Take 1 tablet (10 mg total) by mouth daily. 90 tablet 3  . Vitamin D,  Ergocalciferol, (DRISDOL) 50000 units CAPS  capsule Take 1 capsule (50,000 Units total) by mouth every 7 (seven) days. 30 capsule 1   No current facility-administered medications for this visit.     Allergies  Allergen Reactions  . Codeine Nausea Only  . Symproic [Naldemedine] Nausea And Vomiting      Review of Systems:   General:  normal appetite, decreased energy, no weight gain, no weight loss, no fever  Cardiac:  no chest pain with exertion, no chest pain at rest, +SOB with  exertion, no resting SOB, no PND, + orthopnea, no palpitations, no arrhythmia, no atrial fibrillation, no LE edema, no dizzy spells, no syncope  Respiratory:  +exertional shortness of breath, no home oxygen, no productive cough, no dry cough, no bronchitis, no wheezing, no hemoptysis, no asthma, no pain with inspiration or cough, no sleep apnea, no CPAP at night  GI:   no difficulty swallowing, no reflux, + frequent heartburn, no hiatal hernia, no abdominal pain, no constipation, no diarrhea, no hematochezia, no hematemesis, no melena  GU:   no dysuria,  no frequency, no urinary tract infection, no hematuria, no kidney stones, no kidney disease  Vascular:  + pain suggestive of claudication, no pain in feet, + leg cramps, no varicose veins, no DVT, no non-healing foot ulcer  Neuro:   no stroke, no TIA's, no seizures, no headaches, no temporary blindness one eye,  no slurred speech, no peripheral neuropathy, no chronic pain, no instability of gait, no memory/cognitive dysfunction  Musculoskeletal: no arthritis, no joint swelling, no myalgias, no difficulty walking, normal mobility   Skin:   no rash, no itching, no skin infections, no pressure sores or ulcerations  Psych:   no anxiety, no depression, no nervousness, no unusual recent stress  Eyes:   no blurry vision, no floaters, no recent vision changes, no glasses or contacts  ENT:   no hearing loss, no loose or painful teeth, + dentures but they  hurt.  Hematologic:  no easy bruising, no abnormal bleeding, no clotting disorder, no frequent epistaxis  Endocrine:  no diabetes, does not check CBG's at home     Physical Exam:   BP (!) 162/79 (BP Location: Right Arm, Patient Position: Sitting, Cuff Size: Large)   Pulse 68   Resp 16   Ht 5\' 5"  (1.651 m)   Wt 150 lb 3.2 oz (68.1 kg)   SpO2 97% Comment: ON RA  BMI 24.99 kg/m   General:  Well-appearing  HEENT:  Unremarkable, NCAT, PERLA, EOMI, oropharynx clear  Neck:   no JVD, no bruits, no adenopathy   Chest:   clear to auscultation, symmetrical breath sounds, no wheezes, no rhonchi   CV:   RRR, grade III/VI crescendo/decrescendo murmur heard best at RSB,  no diastolic murmur  Abdomen:  soft, non-tender, no masses or organomegaly  Extremities:  warm, well-perfused, pulses not palpable, no LE edema  Rectal/GU  Deferred  Neuro:   Grossly non-focal and symmetrical throughout  Skin:   Clean and dry, no rashes, no breakdown   Diagnostic Tests:          [facility]                        [address]                        [place], [place] [address]  [phone]  ------------------------------------------------------------------- Transthoracic Echocardiography  Patient:    Alexandra [name], Alexandra [name] MR #:       [id number] Study Date: 10/31/2017 Gender:     F Age:        50 Height:     167.6 cm Weight:     70.1 kg BSA:        1.82 m^2 Pt. Status: Room:   SONOGRAPHER  Wyatt Mage, Du Quoin MD  ORDERING     Glenetta Hew, MD  REFERRING    Glenetta Hew, MD  REFERRING    Jacqulynn Cadet, [place], Outpatient  cc:  ------------------------------------------------------------------- LV EF: 55% -   60%  ------------------------------------------------------------------- Indications:      Pulmonary hypertension (I27.2).  ------------------------------------------------------------------- History:    PMH:   Dyspnea.  Aortic valve disease.  Chronic obstructive pulmonary disease.  Risk factors:  Pulmonary hypertension. PAT. Palpitation. Current tobacco use. Hypertension. Dyslipidemia.  ------------------------------------------------------------------- Study Conclusions  - Left ventricle: The cavity size was normal. Wall thickness was   increased in a pattern of moderate LVH. Systolic function was   normal. The estimated ejection fraction was in the range of 55%   to 60%. Wall motion was normal; there were no regional wall   motion abnormalities. Doppler parameters are consistent with   abnormal left ventricular relaxation (grade 1 diastolic   dysfunction). The E/e&' ratio is between 8-15, suggesting   indeterminate LV filling pressure. - Aortic valve: Calcified with severe stenosis. Mild to moderate   regurgitation. Mean gradient (S): 32 mm Hg. Peak gradient (S): 62   mm Hg. Valve area (VTI): 0.87 cm^2. Valve area (Vmax): 0.78 cm^2.   Valve area (Vmean): 0.78 cm^2. - Mitral valve: Calcified annulus. Mildly thickened leaflets .   There was mild to moderate regurgitation. - Left atrium: The atrium was normal in size. - Inferior vena cava: The vessel was normal in size. The   respirophasic diameter changes were in the normal range (>= 50%),   consistent with normal central venous pressure.  Impressions:  - Compared to a prior study in 2018, there is now severe aortic   stenosis - mean gradient of 32 mmHg wtih AVA around 0.8 cm2.  ------------------------------------------------------------------- Labs, prior tests, procedures, and surgery: Transthoracic echocardiography (10/30/2016).    The aortic valve showed moderate stenosis and mild regurgitation.  EF was 50% and PA pressure was 34 (systolic). Aortic valve: peak gradient of 64 mm Hg and mean gradient of 31 mm Hg.  ------------------------------------------------------------------- Study data:  Strain imaging.  Comparison was made to the study of 10/30/2016.  Study status:  Routine.  Procedure:  The patient reported no pain pre or post test. Transthoracic echocardiography. Image quality was adequate.  Study completion:  There were no complications.          Transthoracic echocardiography.  M-mode, complete 2D, 3D, spectral Doppler, and color Doppler.  Birthdate: Patient birthdate: Aug 14, 1950.  Age:  Patient is 67 yr old.  Sex: Gender: female.    BMI: 24.9 kg/m^2.  Blood pressure:     118/84 Patient status:  Outpatient.  Study date:  Study date: 10/31/2017. Study time: 09:57 AM.  Location:  [facility]  -------------------------------------------------------------------  ------------------------------------------------------------------- Left ventricle:  The cavity size was normal. Wall thickness was increased in a pattern of moderate LVH. Systolic function was normal. The estimated ejection fraction was in the range of 55% to 60%. Wall motion was normal; there were no  regional wall motion abnormalities. Doppler parameters are consistent with abnormal left ventricular relaxation (grade 1 diastolic dysfunction). The E/e&' ratio is between 8-15, suggesting indeterminate LV filling pressure.  ------------------------------------------------------------------- Aortic valve:  Calcified with severe stenosis. Mild to moderate regurgitation.  Doppler:     VTI ratio of LVOT to aortic valve: 0.31. Valve area (VTI): 0.87 cm^2. Indexed valve area (VTI): 0.48 cm^2/m^2. Peak velocity ratio of LVOT to aortic valve: 0.28. Valve area (Vmax): 0.78 cm^2. Indexed valve area (Vmax): 0.43 cm^2/m^2. Mean velocity ratio of LVOT to aortic valve: 0.27. Valve area (Vmean): 0.78 cm^2. Indexed valve area (Vmean): 0.43 cm^2/m^2. Mean gradient (S): 32 mm Hg. Peak gradient (S): 62 mm Hg.  ------------------------------------------------------------------- Aorta:  Aortic root: The aortic root was normal in  size. Ascending aorta: The ascending aorta was normal in size.  ------------------------------------------------------------------- Mitral valve:   Calcified annulus. Mildly thickened leaflets . Doppler:  There was mild to moderate regurgitation.    Valve area by pressure half-time: 1.61 cm^2. Indexed valve area by pressure half-time: 0.89 cm^2/m^2. Valve area by continuity equation (using LVOT flow): 2.6 cm^2. Indexed valve area by continuity equation (using LVOT flow): 1.43 cm^2/m^2.    Mean gradient (D): 3 mm Hg. Peak gradient (D): 2 mm Hg.  ------------------------------------------------------------------- Left atrium:  The atrium was normal in size.  ------------------------------------------------------------------- Atrial septum:  No defect or patent foramen ovale was identified.   ------------------------------------------------------------------- Right ventricle:  The cavity size was normal. Wall thickness was normal. Systolic function was normal.  ------------------------------------------------------------------- Pulmonic valve:    The valve appears to be grossly normal. Doppler:  There was no significant regurgitation.  ------------------------------------------------------------------- Tricuspid valve:   Doppler:  There was no significant regurgitation.  ------------------------------------------------------------------- Pulmonary artery:   The main pulmonary artery was normal-sized.  ------------------------------------------------------------------- Right atrium:  The atrium was normal in size.  ------------------------------------------------------------------- Pericardium:  There was no pericardial effusion.  ------------------------------------------------------------------- Systemic veins: Inferior vena cava: The vessel was normal in size. The respirophasic diameter changes were in the normal range (>= 50%), consistent with normal central venous  pressure.  ------------------------------------------------------------------- Measurements   Left ventricle                            Value          Reference  LV ID, ED, PLAX chordal                   49    mm       43 - 52  LV ID, ES, PLAX chordal                   34    mm       23 - 38  LV fx shortening, PLAX chordal            31    %        >=29  LV PW thickness, ED                       10    mm       ---------  IVS/LV PW ratio, ED               (H)     1.4            <=1.3  Stroke volume, 2D  77    ml       ---------  Stroke volume/bsa, 2D                     42    ml/m^2   ---------  LV e&', lateral                            5.63  cm/s     ---------  LV E/e&', lateral                          13.78          ---------  LV e&', medial                             3.53  cm/s     ---------  LV E/e&', medial                           21.98          ---------  LV e&', average                            4.58  cm/s     ---------  LV E/e&', average                          16.94          ---------    Ventricular septum                        Value          Reference  IVS thickness, ED                         14    mm       ---------    LVOT                                      Value          Reference  LVOT ID, S                                19    mm       ---------  LVOT area                                 2.84  cm^2     ---------  LVOT peak velocity, S                     109   cm/s     ---------  LVOT mean velocity, S                     72.9  cm/s     ---------  LVOT VTI, S  27    cm       ---------    Aortic valve                              Value          Reference  Aortic valve peak velocity, S             395   cm/s     ---------  Aortic valve mean velocity, S             266   cm/s     ---------  Aortic valve VTI, S                       88.2  cm       ---------  Aortic mean gradient, S                   32    mm  Hg    ---------  Aortic peak gradient, S                   62    mm Hg    ---------  VTI ratio, LVOT/AV                        0.31           ---------  Aortic valve area, VTI                    0.87  cm^2     ---------  Aortic valve area/bsa, VTI                0.48  cm^2/m^2 ---------  Velocity ratio, peak, LVOT/AV             0.28           ---------  Aortic valve area, peak velocity          0.78  cm^2     ---------  Aortic valve area/bsa, peak               0.43  cm^2/m^2 ---------  velocity  Velocity ratio, mean, LVOT/AV             0.27           ---------  Aortic valve area, mean velocity          0.78  cm^2     ---------  Aortic valve area/bsa, mean               0.43  cm^2/m^2 ---------  velocity  Aortic regurg pressure half-time          515   ms       ---------    Aorta                                     Value          Reference  Aortic root ID, ED                        37    mm       ---------  Ascending aorta ID, A-P, S  33    mm       ---------    Left atrium                               Value          Reference  LA ID, A-P, ES                            43    mm       ---------  LA ID/bsa, A-P                    (H)     2.37  cm/m^2   <=2.2  LA volume, S                              46.5  ml       ---------  LA volume/bsa, S                          25.6  ml/m^2   ---------  LA volume, ES, 1-p A4C                    39    ml       ---------  LA volume/bsa, ES, 1-p A4C                21.5  ml/m^2   ---------  LA volume, ES, 1-p A2C                    53.8  ml       ---------  LA volume/bsa, ES, 1-p A2C                29.6  ml/m^2   ---------    Mitral valve                              Value          Reference  Mitral E-wave peak velocity               77.6  cm/s     ---------  Mitral A-wave peak velocity               130   cm/s     ---------  Mitral mean velocity, D                   73.7  cm/s     ---------  Mitral deceleration time          (H)      433   ms       150 - 230  Mitral pressure half-time                 146   ms       ---------  Mitral mean gradient, D                   3     mm Hg    ---------  Mitral peak gradient, D                   2     mm Hg    ---------  Mitral E/A ratio, peak                    0.6            ---------  Mitral valve area, PHT, DP                1.61  cm^2     ---------  Mitral valve area/bsa, PHT, DP            0.89  cm^2/m^2 ---------  Mitral valve area, LVOT                   2.6   cm^2     ---------  continuity  Mitral valve area/bsa, LVOT               1.43  cm^2/m^2 ---------  continuity  Mitral annulus VTI, D                     29.5  cm       ---------  Mitral regurg VTI, PISA                   227   cm       ---------  Mitral ERO, PISA                          0.13  cm^2     ---------  Mitral regurg volume, PISA                30    ml       ---------    Systemic veins                            Value          Reference  Estimated CVP                             3     mm Hg    ---------    Right ventricle                           Value          Reference  TAPSE                                     20.7  mm       ---------  RV s&', lateral, S                         10.9  cm/s     ---------  Legend: (L)  and  (H)  mark values outside specified reference range.  ------------------------------------------------------------------- Prepared and Electronically Authenticated by  Lyman Bishop MD 2019-08-09T15:39:08  Physicians   Panel Physicians Referring Physician Case Authorizing Physician  Larey Dresser, MD (Primary)    Procedures   RIGHT/LEFT HEART CATH AND CORONARY ANGIOGRAPHY  Conclusion   1. Low filling pressures.  2. Mild pulmonary hypertension, suspect group 3 PH due to COPD.  PVR only 3.78 WU.  3. Nonobstructive CAD.   I did not cross the aortic valve as I am fairly certain that aortic stenosis is severe based on  echo and symptoms.    Patient should proceed  with TAVR.  No treatment at this time for mild pulmonary hypertension.  She can decrease Lasix to 40 mg once daily.   Procedural Details/Technique   Technical Details Procedure: Right Heart Cath, Selective Coronary Angiography  Indication: Severe aortic stenosis, pre-TAVR   Procedural Details: The right brachial and radial areas were prepped, draped, and anesthetized with 1% lidocaine. There was a pre-existing peripheral IV that was replaced with a 49F venous sheath. A Swan-Ganz catheter was used for the right heart catheterization. Standard protocol was followed for recording of right heart pressures and sampling of oxygen saturations. Fick cardiac output was calculated. The right radial artery was entered using modified Seldinger technique and a 77F sheath was placed. The patient received 3 mg IA verapamil and weight-based IV heparin. Standard Judkins catheters were used for selective coronary angiography. There were no immediate procedural complications. The patient was transferred to the post catheterization recovery area for further monitoring.    Estimated blood loss <50 mL.  During this procedure the patient was administered the following to achieve and maintain moderate conscious sedation: Versed 2 mg, Fentanyl 50 mcg, while the patient's heart rate, blood pressure, and oxygen saturation were continuously monitored. The period of conscious sedation was 26 minutes, of which I was present face-to-face 100% of this time.  Coronary Findings   Diagnostic  Dominance: Right  Left Main  No significant coronary disease.  Left Anterior Descending  40% distal LAD stenosis.  Ramus Intermedius  Small to moderate vessel, no significant disease.  Left Circumflex  No significant coronary disease.  Right Coronary Artery  30% proximal RCA stenosis.  Intervention   No interventions have been documented.  Right Heart   Right Heart Pressures RHC Procedural Findings: Hemodynamics (mmHg) RA 4 RV  39/2 PA 39/8, mean 21 PCWP mean 5  Oxygen saturations: PA 67% AO 97%  Cardiac Output (Fick) 4.28  Cardiac Index (Fick) 2.45 PVR 3.78 WU  Implants    No implant documentation for this case.  MERGE Images   Show images for CARDIAC CATHETERIZATION   Link to Procedure Log   Procedure Log    Hemo Data    Most Recent Value  Fick Cardiac Output 4.28 L/min  Fick Cardiac Output Index 2.45 (L/min)/BSA  RA A Wave 4 mmHg  RA V Wave 0 mmHg  RA Mean 0 mmHg  RV Systolic Pressure 39 mmHg  RV Diastolic Pressure -3 mmHg  RV EDP 2 mmHg  PA Systolic Pressure 39 mmHg  PA Diastolic Pressure 8 mmHg  PA Mean 21 mmHg  PW A Wave 8 mmHg  PW V Wave 7 mmHg  PW Mean 5 mmHg  AO Systolic Pressure 979 mmHg  AO Diastolic Pressure 59 mmHg  AO Mean 92 mmHg  QP/QS 1  TPVR Index 8.56 HRUI  TSVR Index 37.51 HRUI  TPVR/TSVR Ratio 0.23    ADDENDUM REPORT: 12/18/2017 12:33  CLINICAL DATA:  Aortic stenosis  EXAM: Cardiac TAVR CT  TECHNIQUE: The patient was scanned on a [employer] scanner. A 120 kV retrospective scan was triggered in the descending thoracic aorta at 111 HU's. Gantry rotation speed was 270 msecs and collimation was .9 mm. No beta blockade or nitro were given. The 3D data set was reconstructed in 5% intervals of the R-R cycle. Systolic and diastolic phases were analyzed on a dedicated work station using MPR, MIP and VRT modes. The patient received 80 cc of contrast.  FINDINGS: Aortic Valve:  Tri leaflet and calcified with restricted leaflet motion  Aorta: Moderate calcific aortic debris  Sinotubular Junction: 25 mm  Ascending Thoracic Aorta: 33 mm  Aortic Arch: 24 mm  Descending Thoracic Aorta: 26 mm  Sinus of Valsalva Measurements:  Non-coronary: 29 mm  Right -coronary: 29 mm  Left -coronary: 29.5 mm  Coronary Artery Height above Annulus:  Left Main: 10.4 mm above annulus  Right Coronary: 15.4 mm above annulus  Virtual Basal Annulus  Measurements:  Maximum/Minimum Diameter: 27.2 x 18.6 mm  Perimeter: 75 mm  Area: 423 mm2  Coronary Arteries: Sufficient height above annulus for deployment  Optimum Fluoroscopic Angle for Delivery: LAO 12 Caudal 12 degrees  IMPRESSION: 1. Calcified tri leaflet AV with annular area of 423 mm2 suitable for a 23 mm Sapien 3 valve  2.  No LAA thrombus  3.  Optimum angle for deployment LAO 12 Caudal 12 degrees  4.  Coronary arteries sufficient height above annulus for deployment  Jenkins Rouge   Electronically Signed   By: Jenkins Rouge M.D.   On: 12/18/2017 12:33   CLINICAL DATA:  67 year old female with history of severe aortic stenosis. Preprocedural study prior to potential transcatheter aortic valve replacement (TAVR) procedure.  EXAM: CT ANGIOGRAPHY CHEST, ABDOMEN AND PELVIS  TECHNIQUE: Multidetector CT imaging through the chest, abdomen and pelvis was performed using the standard protocol during bolus administration of intravenous contrast. Multiplanar reconstructed images and MIPs were obtained and reviewed to evaluate the vascular anatomy.  CONTRAST:  12mL ISOVUE-370 IOPAMIDOL (ISOVUE-370) INJECTION 76%  COMPARISON:  Chest CT 10/11/2016.  FINDINGS: CTA CHEST FINDINGS  Cardiovascular: Heart size is borderline enlarged. There is no significant pericardial fluid, thickening or pericardial calcification. There is aortic atherosclerosis, as well as atherosclerosis of the great vessels of the mediastinum and the coronary arteries, including calcified atherosclerotic plaque in the left anterior descending, left circumflex and right coronary arteries. Severe thickening and calcification of the aortic valve. Calcifications of the mitral annulus.  Mediastinum/Lymph Nodes: No pathologically enlarged mediastinal or hilar lymph nodes. Esophagus is unremarkable in appearance. No axillary lymphadenopathy.  Lungs/Pleura: A few small pulmonary  nodules are noted throughout the lungs bilaterally, largest of which is in the anterior aspect of the superior segment of the right lower lobe (axial image 48 of series 7) abutting the major fissure measuring 5 mm. No larger more suspicious appearing pulmonary nodules or masses are noted. No acute consolidative airspace disease. No pleural effusions. Dependent areas of atelectasis are noted in the lower lobes of the lungs bilaterally.  Musculoskeletal/Soft Tissues: There are no aggressive appearing lytic or blastic lesions noted in the visualized portions of the skeleton.  CTA ABDOMEN AND PELVIS FINDINGS  Hepatobiliary: No suspicious cystic or solid hepatic lesions. No intra or extrahepatic biliary ductal dilatation. Status post cholecystectomy.  Pancreas: No pancreatic mass. No pancreatic ductal dilatation. No pancreatic or peripancreatic fluid or inflammatory changes.  Spleen: Unremarkable.  Adrenals/Urinary Tract: Multiple low-attenuation lesions in both kidneys, compatible with simple cysts, largest of which is 2.4 cm in the periphery of the lower pole the left kidney. Other subcentimeter low-attenuation lesions are noted in the kidneys bilaterally, too small to characterize, but statistically likely to represent tiny cysts. Small nonobstructive calculi are noted within the collecting systems of the kidneys bilaterally, largest of which is in the lower pole of the right kidney measuring 3 mm. Bilateral adrenal glands are normal. No hydroureteronephrosis. Urinary bladder is normal in appearance.  Stomach/Bowel: Normal appearance of the stomach. No pathologic dilatation  of small bowel or colon. Numerous colonic diverticulae are noted, without surrounding inflammatory changes to suggest an acute diverticulitis at this time. The appendix is not confidently identified and may be surgically absent. Regardless, there are no inflammatory changes noted adjacent to the cecum  to suggest the presence of an acute appendicitis at this time.  Vascular/Lymphatic: Aortic atherosclerosis, with vascular findings and measurements pertinent to potential TAVR procedure, as detailed below. Multifocal fusiform ectasia of the abdominal aorta. 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 Complete occlusion of the left internal iliac artery shortly after its origin. Complete occlusion of both superficial femoral arteries. No lymphadenopathy noted in the abdomen or pelvis.  Reproductive: Uterus and ovaries are atrophic.  Other: No significant volume of ascites.  No pneumoperitoneum.  Musculoskeletal: There are no aggressive appearing lytic or blastic lesions noted in the visualized portions of the skeleton.  VASCULAR MEASUREMENTS PERTINENT TO TAVR:  AORTA:  Minimal Aortic Diameter-11 x 11 mm  Severity of Aortic Calcification-severe  RIGHT PELVIS:  Right Common Iliac Artery -  Minimal Diameter-5.1 x 4.7 mm  Tortuosity-mild  Calcification-severe  Right External Iliac Artery -  Minimal Diameter-3.7 x 3.0 mm  Tortuosity-mild  Calcification-mild  Right Common Femoral Artery -  Minimal Diameter-4.4 x 4.2 mm  Tortuosity-mild  Calcification-mild  LEFT PELVIS:  Left Common Iliac Artery -  Minimal Diameter-7.2 x 6.0 mm  Tortuosity-mild  Calcification-severe  Left External Iliac Artery -  Minimal Diameter-4.9 x 4.9 mm  Tortuosity-mild  Calcification-moderate  Left Common Femoral Artery -  Minimal Diameter-6.7 x 6.6 mm  Tortuosity-mild  Calcification-mild  Review of the MIP images confirms the above findings.  IMPRESSION: 1. Vascular findings and measurements pertinent to potential TAVR procedure, as detailed above. 2. Severe thickening calcification of the aortic valve, compatible with the reported clinical history of severe aortic stenosis. 3. Aortic atherosclerosis, in  addition to three-vessel coronary artery disease. Please note that although the presence of coronary artery calcium documents the presence of coronary artery disease, the severity of this disease and any potential stenosis cannot be assessed on this non-gated CT examination. Assessment for potential risk factor modification, dietary therapy or pharmacologic therapy may be warranted, if clinically indicated. 4. Multiple small pulmonary nodules noted throughout the lungs bilaterally, largest of which measures 5 mm in the right lower lobe. No follow-up needed if patient is low-risk (and has no known or suspected primary neoplasm). Non-contrast chest CT can be considered in 12 months if patient is high-risk. This recommendation follows the consensus statement: Guidelines for Management of Incidental Pulmonary Nodules Detected on CT Images: From the Fleischner Society 2017; Radiology 2017; 284:228-243. 5. Colonic diverticulosis without evidence of acute diverticulitis at this time. 6. Additional incidental findings, as above.   Electronically Signed   By: Vinnie Langton M.D.   On: 12/18/2017 14:37   STS Risk Score:  Procedure: Isolated AVR  Risk of Mortality: 1.894%  Renal Failure: 1.209%  Permanent Stroke: 0.895%  Prolonged Ventilation: 6.528%  DSW Infection: 0.069%  Reoperation: 2.318%  Morbidity or Mortality: 9.547%  Short Length of Stay: 44.583%  Long Length of Stay: 4.044%     Impression:  This 67 year old woman has stage D, severe, symptomatic aortic stenosis with New York Heart Association class II symptoms of exertional fatigue and shortness of breath consistent with chronic diastolic congestive heart failure.  I have personally reviewed her echocardiogram, cardiac catheterization, and CTA studies.  Echocardiogram shows a trileaflet aortic valve with severe calcification of the  valve leaflets with a mean gradient of 32 mmHg and a dimensionless index of 0.28 consistent  with severe aortic stenosis.  There is mild to moderate aortic insufficiency.  Cardiac catheterization shows nonobstructive coronary disease.  I agree that aortic valve replacement is indicated in this patient.  She is a low risk surgical patient but would rather undergo transcatheter aortic valve replacement then open surgical replacement.  I think she is a reasonable candidate for that.  Her gated cardiac CTA shows anatomy that is suitable for transcatheter aortic valve replacement without any complicating features.  Her abdominal and pelvic CTA shows significant calcific atherosclerosis of the aorta and iliac vessels but I think she probably has adequate left iliac and femoral vessels to allow transfemoral insertion.  She has significant calcific atherosclerosis of the aortic arch particularly around the takeoff of the great vessels so if alternative access is required I think we would need to perform transapical insertion.  I discussed this with her.  The patient was counseled at length regarding treatment alternatives for management of severe symptomatic aortic stenosis. The risks and benefits of surgical intervention has been discussed in detail. Long-term prognosis with medical therapy was discussed. Alternative approaches such as conventional surgical aortic valve replacement, transcatheter aortic valve replacement, and palliative medical therapy were compared and contrasted at length. This discussion was placed in the context of the patient's own specific clinical presentation and past medical history. All of her questions have been addressed.   Following the decision to proceed with transcatheter aortic valve replacement, a discussion was held regarding what types of management strategies would be attempted intraoperatively in the event of life-threatening complications, including whether or not the patient would be considered a candidate for the use of cardiopulmonary bypass and/or conversion to open  sternotomy for attempted surgical intervention. The patient has been advised of a variety of complications that might develop including but not limited to risks of death, stroke, paravalvular leak, aortic dissection or other major vascular complications, aortic annulus rupture, device embolization, cardiac rupture or perforation, mitral regurgitation, acute myocardial infarction, arrhythmia, heart block or bradycardia requiring permanent pacemaker placement, congestive heart failure, respiratory failure, renal failure, pneumonia, infection, other late complications related to structural valve deterioration or migration, or other complications that might ultimately cause a temporary or permanent loss of functional independence or other long term morbidity. The patient provides full informed consent for the procedure as described and all questions were answered.    Plan:  She will be scheduled for transfemoral transcatheter aortic valve replacement using a Sapien 3 valve on Tuesday, 12/30/2017.  I think she will need to be done under general anesthesia in case she does not have adequate transfemoral access and requires transapical insertion.   I spent 60 minutes performing this consultation and > 50% of this time was spent face to face counseling and coordinating the care of this patient's severe symptomatic aortic stenosis.  Gaye Pollack, MD 12/24/2017

## 2017-12-29 ENCOUNTER — Encounter (HOSPITAL_COMMUNITY)
Admission: RE | Admit: 2017-12-29 | Discharge: 2017-12-29 | Disposition: A | Discharge status: Home / Self Care | Payer: Medicare HMO | Source: Ambulatory Visit | Attending: Cardiovascular Disease | Admitting: Cardiovascular Disease

## 2017-12-29 ENCOUNTER — Other Ambulatory Visit: Payer: Self-pay

## 2017-12-29 ENCOUNTER — Encounter (HOSPITAL_COMMUNITY): Payer: Self-pay

## 2017-12-29 ENCOUNTER — Ambulatory Visit (HOSPITAL_COMMUNITY)
Admission: RE | Admit: 2017-12-29 | Discharge: 2017-12-29 | Disposition: A | Discharge status: Home / Self Care | Payer: Medicare HMO | Source: Ambulatory Visit | Attending: Cardiovascular Disease | Admitting: Cardiovascular Disease

## 2017-12-29 DIAGNOSIS — Z9889 Other specified postprocedural states: Secondary | ICD-10-CM

## 2017-12-29 DIAGNOSIS — E785 Hyperlipidemia, unspecified: Secondary | ICD-10-CM

## 2017-12-29 DIAGNOSIS — I447 Left bundle-branch block, unspecified: Secondary | ICD-10-CM | POA: Insufficient documentation

## 2017-12-29 DIAGNOSIS — I700 Atherosclerosis of aorta: Secondary | ICD-10-CM | POA: Insufficient documentation

## 2017-12-29 DIAGNOSIS — I110 Hypertensive heart disease with heart failure: Secondary | ICD-10-CM

## 2017-12-29 DIAGNOSIS — I350 Nonrheumatic aortic (valve) stenosis: Secondary | ICD-10-CM

## 2017-12-29 DIAGNOSIS — I080 Rheumatic disorders of both mitral and aortic valves: Secondary | ICD-10-CM

## 2017-12-29 DIAGNOSIS — Z7982 Long term (current) use of aspirin: Secondary | ICD-10-CM | POA: Insufficient documentation

## 2017-12-29 DIAGNOSIS — I2510 Atherosclerotic heart disease of native coronary artery without angina pectoris: Secondary | ICD-10-CM | POA: Insufficient documentation

## 2017-12-29 DIAGNOSIS — I5032 Chronic diastolic (congestive) heart failure: Secondary | ICD-10-CM

## 2017-12-29 DIAGNOSIS — J449 Chronic obstructive pulmonary disease, unspecified: Secondary | ICD-10-CM

## 2017-12-29 DIAGNOSIS — I272 Pulmonary hypertension, unspecified: Secondary | ICD-10-CM | POA: Insufficient documentation

## 2017-12-29 DIAGNOSIS — Z79899 Other long term (current) drug therapy: Secondary | ICD-10-CM | POA: Insufficient documentation

## 2017-12-29 DIAGNOSIS — Z9049 Acquired absence of other specified parts of digestive tract: Secondary | ICD-10-CM

## 2017-12-29 DIAGNOSIS — K5730 Diverticulosis of large intestine without perforation or abscess without bleeding: Secondary | ICD-10-CM | POA: Insufficient documentation

## 2017-12-29 LAB — APTT: APTT: 33 s (ref 24–36)

## 2017-12-29 LAB — TYPE AND SCREEN
ABO/RH(D): A NEG
Antibody Screen: NEGATIVE

## 2017-12-29 LAB — URINALYSIS, ROUTINE W REFLEX MICROSCOPIC
BILIRUBIN URINE: NEGATIVE
GLUCOSE, UA: NEGATIVE mg/dL
Ketones, ur: NEGATIVE mg/dL
LEUKOCYTES UA: NEGATIVE
NITRITE: NEGATIVE
PH: 6 (ref 5.0–8.0)
Protein, ur: NEGATIVE mg/dL
Specific Gravity, Urine: 1.013 (ref 1.005–1.030)

## 2017-12-29 LAB — COMPREHENSIVE METABOLIC PANEL
ALK PHOS: 91 U/L (ref 38–126)
ALT: 9 U/L (ref 0–44)
AST: 14 U/L — ABNORMAL LOW (ref 15–41)
Albumin: 3.7 g/dL (ref 3.5–5.0)
Anion gap: 11 (ref 5–15)
BILIRUBIN TOTAL: 0.6 mg/dL (ref 0.3–1.2)
BUN: 12 mg/dL (ref 8–23)
CALCIUM: 9.2 mg/dL (ref 8.9–10.3)
CO2: 19 mmol/L — AB (ref 22–32)
CREATININE: 0.94 mg/dL (ref 0.44–1.00)
Chloride: 106 mmol/L (ref 98–111)
GFR calc Af Amer: >60 mL/min (ref >60)
Glucose, Bld: 101 mg/dL — ABNORMAL HIGH (ref 70–99)
Potassium: 4 mmol/L (ref 3.5–5.1)
Sodium: 136 mmol/L (ref 135–145)
TOTAL PROTEIN: 6.5 g/dL (ref 6.5–8.1)

## 2017-12-29 LAB — PROTIME-INR
INR: 1.04
Prothrombin Time: 13.5 seconds (ref 11.4–15.2)

## 2017-12-29 LAB — SURGICAL PCR SCREEN
MRSA, PCR: NEGATIVE
STAPHYLOCOCCUS AUREUS: NEGATIVE

## 2017-12-29 LAB — CBC
HCT: 41.5 % (ref 36.0–46.0)
Hemoglobin: 12.9 g/dL (ref 12.0–15.0)
MCH: 29.9 pg (ref 26.0–34.0)
MCHC: 31.1 g/dL (ref 30.0–36.0)
MCV: 96.1 fL (ref 78.0–100.0)
Platelets: 235 10*3/uL (ref 150–400)
RBC: 4.32 MIL/uL (ref 3.87–5.11)
RDW: 13.1 % (ref 11.5–15.5)
WBC: 10.8 10*3/uL — AB (ref 4.0–10.5)

## 2017-12-29 LAB — BLOOD GAS, ARTERIAL
ACID-BASE EXCESS: 0.9 mmol/L (ref 0.0–2.0)
Bicarbonate: 24.3 mmol/L (ref 20.0–28.0)
DRAWN BY: 470591
FIO2: 21
O2 SAT: 98.9 %
PH ART: 7.46 — AB (ref 7.350–7.450)
Patient temperature: 98.6
pCO2 arterial: 34.7 mmHg (ref 32.0–48.0)
pO2, Arterial: 131 mmHg — ABNORMAL HIGH (ref 83.0–108.0)

## 2017-12-29 LAB — ABO/RH: ABO/RH(D): A NEG

## 2017-12-29 LAB — HEMOGLOBIN A1C
HEMOGLOBIN A1C: 5.5 % (ref 4.8–5.6)
Mean Plasma Glucose: 111.15 mg/dL

## 2017-12-29 LAB — BRAIN NATRIURETIC PEPTIDE: B NATRIURETIC PEPTIDE 5: 1354.2 pg/mL — AB (ref 0.0–100.0)

## 2017-12-29 MED ORDER — POTASSIUM CHLORIDE 2 MEQ/ML IV SOLN
80.0000 meq | INTRAVENOUS | Status: DC
  Filled 2017-12-29: qty 40

## 2017-12-29 MED ORDER — SODIUM CHLORIDE 0.9 % IV SOLN
INTRAVENOUS | Status: DC
  Filled 2017-12-29: qty 30

## 2017-12-29 MED ORDER — MAGNESIUM SULFATE 50 % IJ SOLN
40.0000 meq | INTRAMUSCULAR | Status: DC
  Filled 2017-12-29: qty 9.85

## 2017-12-29 MED ORDER — EPINEPHRINE PF 1 MG/ML IJ SOLN
0.0000 ug/min | INTRAVENOUS | Status: DC
  Filled 2017-12-29: qty 4

## 2017-12-29 MED ORDER — SODIUM CHLORIDE 0.9 % IV SOLN
INTRAVENOUS | Status: DC
  Filled 2017-12-29: qty 1

## 2017-12-29 MED ORDER — SODIUM CHLORIDE 0.9 % IV SOLN
1.5000 g | INTRAVENOUS | Status: AC
  Administered 2017-12-30: 1.5 g via INTRAVENOUS
  Filled 2017-12-29: qty 1.5

## 2017-12-29 MED ORDER — PHENYLEPHRINE HCL-NACL 20-0.9 MG/250ML-% IV SOLN
30.0000 ug/min | INTRAVENOUS | Status: DC
  Filled 2017-12-29: qty 250

## 2017-12-29 MED ORDER — NITROGLYCERIN IN D5W 200-5 MCG/ML-% IV SOLN
2.0000 ug/min | INTRAVENOUS | Status: DC
  Filled 2017-12-29: qty 250

## 2017-12-29 MED ORDER — NOREPINEPHRINE 4 MG/250ML-% IV SOLN
0.0000 ug/min | INTRAVENOUS | Status: DC
  Filled 2017-12-29: qty 250

## 2017-12-29 MED ORDER — DOPAMINE-DEXTROSE 3.2-5 MG/ML-% IV SOLN
0.0000 ug/kg/min | INTRAVENOUS | Status: DC
  Filled 2017-12-29: qty 250

## 2017-12-29 MED ORDER — VANCOMYCIN HCL 10 G IV SOLR
1250.0000 mg | INTRAVENOUS | Status: AC
  Administered 2017-12-30: 1250 mg via INTRAVENOUS
  Filled 2017-12-29: qty 1250

## 2017-12-29 MED ORDER — DEXMEDETOMIDINE HCL IN NACL 400 MCG/100ML IV SOLN
0.1000 ug/kg/h | INTRAVENOUS | Status: AC
  Administered 2017-12-30: 1 ug/kg/h via INTRAVENOUS
  Filled 2017-12-29: qty 100

## 2017-12-29 NOTE — Progress Notes (Signed)
PCP is Dr. Darlin Coco  LOV 10/2017 Cardio is Dr. Einar Crow  LOV 10/2017 She states she has murmur - denies cp, sob currently She has been instructed by office not to take any medications the morning of procedure/surgery.

## 2017-12-29 NOTE — Progress Notes (Signed)
Anesthesia Chart Review:  Case:  [id number] Date/Time:  12/30/17 1213   Procedures:      TRANSCATHETER AORTIC VALVE REPLACEMENT, TRANSFEMORAL (N/A Chest)     POSSIBLE TRANSCATHETER AORTIC VALVE REPLACEMENT, TRANSAPICAL (N/A )     TRANSESOPHAGEAL ECHOCARDIOGRAM (TEE) (N/A )   Anesthesia type:  General   Pre-op diagnosis:  Severe Aortic Stenosis   Location:  [facility] OR ROOM 3 / [place] [facility]   Surgeon:  Burnell Blanks, MD      DISCUSSION: Patient is a 67 year old female scheduled for the above procedure.  History includes smoking, non-obstructive CAD (40% dLAD, 30% pRCA 11/20/17), severe AS and moderate MR, chronic diastolic CHF, mild pulmonary hypertension (associated with AS, MR, COPD), HTN, HLD, GERD, COPD. Arteriogram 08/09/14 showed chronically occluded right ulnar artery.  Per Dr. Vivi Martens 12/24/17 office note, "She will be scheduled for transfemoral transcatheter aortic valve replacement using a Sapien 3 valve on Tuesday, 12/30/2017. I think she will need to be done under general anesthesia in case she does not have adequate transfemoral access and requires transapical insertion."    VS: BP (!) 157/76 (BP Location: Right Arm)   Pulse 68   Temp (!) 36.3 C (Oral)   Resp 18   Ht 5\' 5"  (1.651 m)   Wt 67.9 kg   SpO2 97%   BMI 24.93 kg/m   PROVIDERS: Patient, [name] Glenetta Hew, MD is primary cardiologist Loralie Champagne, MD is HF cardiologist Prescott Gum, Collier Salina, MD is CT surgeon for RLL lung nodule, that had near complete resolution on follow-up chest CT 10/11/16. He did not recommend biopsy at that point, but did order and echo to further evaluate murmur of aortic stenosis.   LABS: Preoperative labs noted. BNP 1354. (all labs ordered are listed, but only abnormal results are displayed)  Labs Reviewed  BLOOD GAS, ARTERIAL - Abnormal; Notable for the following components:      Result Value   pH, Arterial 7.460 (*)    pO2, Arterial 131 (*)    All other components within normal  limits  BRAIN NATRIURETIC PEPTIDE - Abnormal; Notable for the following components:   B Natriuretic Peptide 1,354.2 (*)    All other components within normal limits  CBC - Abnormal; Notable for the following components:   WBC 10.8 (*)    All other components within normal limits  COMPREHENSIVE METABOLIC PANEL - Abnormal; Notable for the following components:   CO2 19 (*)    Glucose, Bld 101 (*)    AST 14 (*)    All other components within normal limits  URINALYSIS, ROUTINE W REFLEX MICROSCOPIC - Abnormal; Notable for the following components:   Hgb urine dipstick SMALL (*)    Bacteria, UA RARE (*)    All other components within normal limits  SURGICAL PCR SCREEN  APTT  HEMOGLOBIN A1C  PROTIME-INR  TYPE AND SCREEN    PFTs 07/15/17 post-BD: FVC 78%, FEV1 78%, ratio 100%, TLC 87%, DLCO 50%. Mild obstruction but severely decreased DLCO.   IMAGES: CXR 12/29/17: Still in process as of 4:40 PM.  CTA chest/abd/pelvis 12/18/17: IMPRESSION: 1. Vascular findings and measurements pertinent to potential TAVR procedure, as detailed above. 2. Severe thickening calcification of the aortic valve, compatible with the reported clinical history of severe aortic stenosis. 3. Aortic atherosclerosis, in addition to three-vessel coronary artery disease. Please note that although the presence of coronary artery calcium documents the presence of coronary artery disease, the severity of this disease and any potential  stenosis cannot be assessed on this non-gated CT examination. Assessment for potential risk factor modification, dietary therapy or pharmacologic therapy may be warranted, if clinically indicated. 4. Multiple small pulmonary nodules noted throughout the lungs bilaterally, largest of which measures 5 mm in the right lower lobe. No follow-up needed if patient is low-risk (and has no known or suspected primary neoplasm). Non-contrast chest CT can be considered in 12 months if patient is  high-risk. This recommendation follows the consensus statement: Guidelines for Management of Incidental Pulmonary Nodules Detected on CT Images: From the Fleischner Society 2017; Radiology 2017; 284:228-243. 5. Colonic diverticulosis without evidence of acute diverticulitis at this time. 6. Additional incidental findings, as above.   EKG: 12/29/17: SB at 58 bpm, left BBB (old).    CV:   Rent echo, RHC/LHC, CT coronary results are outlined in Dr. Vivi Martens 12/24/17 office note or can be viewed under the Results Review tab.  Carotid U/S 12/10/17: Final Interpretation: - Right Carotid: Velocities in the right ICA are consistent with a 1-39% stenosis. - Left Carotid: Velocities in the left ICA are consistent with a 40-59% stenosis. - Vertebrals: Right vertebral artery demonstrates antegrade flow. Left vertebral artery demonstrates bidirectional flow. - Subclavians: Left subclavian artery was stenotic. Normal flow hemodynamics were seen in the right subclavian artery.  48 Hour Holter monitor 01/13/17:  Mostly sinus rhythm with PVCs (multifocal) with some couplets, as well as bigeminy and trigeminy. Rare PACs noted.  Average heart rate 61 bpm. Minimum heart rate 36 bpm, maximum heart rate 129 bpm  6 runs of SVT/PAT -longest run was 13 beats; fastest was 139 bpm (5 beats)  No diary summary submitted. Symptoms could likely be related to PVCs in bigeminy/trigeminy or short runs of SVT.  Aortic arch angiogram with RUE runoff 08/09/14 (Brabham, V. Rock Nephew, MD): Impression:              #1  Type 1 aortic arch               #3  no significant stenosis, aneurysmal degeneration, or dissection, or luminal irregularity is seen within the RIGHT subclavian, axillary, brachial, or radial artery.  The ulnar artery appears occluded in the distal forearm. There are several collaterals in this area which suggests that this is a chronic condition.      Past Medical History:  Diagnosis Date  . COPD  (chronic obstructive pulmonary disease) ([place]) 05/22/2016  . Coronary artery disease, non-occlusive 11/28/2016   R&LHC: 65% distal LAD lesion -- likely not angiographically significant->medical management. Normal LVEF. Moderately elevated LVEDP. I aortic valve gradient in the Cath Lab, moderate aortic stenosis. This would suggest that the stenosis is on the moderate side of moderate to severe.  Severe pulmonary hypertension (likely mixed).  AVA 0.96 cm; P-P gradient ~ 30 mmHg, Mean ~24.5-27.5 (Moderate AS)   . GERD (gastroesophageal reflux disease)   . Heart murmur    "yrs ago"  . Hx of adenomatous colonic polyps 10/19/2015  . Hyperlipidemia   . Hypertension   . Moderate mitral regurgitation by prior echocardiography 10/2016   Echocardiogram revealed moderate mitral regurgitation  . Moderate to severe aortic stenosis 10/2016   Echocardiogram showed moderate-severe left ear with a mean-peak gradient of 31 and 64 mmHg.  . Nodule of right lung 07/05/2016   9 mm irregular, possibly spiculated, RIGHT lung base laterally. Rather than repeat CT scan in 3 months, patient elects to consider options for tissue sampling. Resolved on follow-up CT 10/23/2016.  . Pulmonary hypertension  associated Aortic Stenosis, Mitral Regurgitation & COPD    Peak PAP by Echo ~54 mmHg;  by Cath PAP/Mean 71/32 mmHg, 53 mmHg (with PCWP & LVEDP ~24-26 mmHg); TPG ~26 mmHg    Past Surgical History:  Procedure Laterality Date  . BREAST SURGERY     cyst removed from right breast  48 yr ago  . CHOLECYSTECTOMY    . COLONOSCOPY  2003, 2017  . CTA Chest  09/2016   Thoracic Aortic Ca2+ w/o dilation.  Coronary Calcification noted. PA normal. Mild Emphysematous changes w/ mild LLL scarring.    [name] EYE SURGERY     OD cataract  . FRACTURE SURGERY     2007 -right tib /fib fracture ----07/2009 right femur fx after a fall  . FRACTURE SURGERY Right 2011   Femur  . PERIPHERAL VASCULAR CATHETERIZATION N/A 08/09/2014   Procedure: Aortic Arch  Angiography;  Surgeon: Serafina Mitchell, MD;  Location: [place] [facility];  Service: Cardiovascular: Type 1 Arch. No significant stenosis, aneurysmal degeneration or dissection.  No luminal irregularity seen in R or L SubClavian, Brachial or Radial A. Chronic distal ulnar A occlusion [place].  [name] PERIPHERAL VASCULAR CATHETERIZATION Right 08/09/2014   Procedure: Upper Extremity Angiography;  Surgeon: Serafina Mitchell, MD;  Location: [place] [facility];  Service: Cardiovascular;  Laterality: Right;  . RIGHT/LEFT HEART CATH AND CORONARY ANGIOGRAPHY N/A 12/06/2016   Procedure: RIGHT/LEFT HEART CATH AND CORONARY ANGIOGRAPHY;  Surgeon: Leonie Man, MD;  Location: [facility]: Cor Angio: 65% dLAD (Med Rx). AVA 0.96 cm; P-P gradient ~ 30 mmHg, Mean ~24.5-27.5 (Mod AS); RHC #s: RAP 8 mmHg, RVP/EDP: 71/8/15 mmHg, PCWP: 21-24 mmHg, PAP/mean: 71/32/52 mmHg = Severe Mixed Pulmonary HTN; LVP/EDP 205/17/26 mmHg ; CO/CI by Fick: 4.35, 2.44 - mildly reduced  . RIGHT/LEFT HEART CATH AND CORONARY ANGIOGRAPHY N/A 11/20/2017   Procedure: RIGHT/LEFT HEART CATH AND CORONARY ANGIOGRAPHY;  Surgeon: Larey Dresser, MD;  Location: [facility];  Service: Cardiovascular;  Laterality: N/A;  . TRANSTHORACIC ECHOCARDIOGRAM  10/30/2016   Mild Concentric LVH. EF 55-60%. No RWMA, Gr 2 DD. Mod-Severe AS (mean-peak Gradient 31 mmHG - 64 mmHg), Mod MR. Mod LA dilation, Mod PA HTN - peak pressure ~54 mmHg). = Progression of AS from 2014  . ULTRASOUND GUIDANCE FOR VASCULAR ACCESS  08/09/2014   Procedure: Ultrasound Guidance For Vascular Access;  Surgeon: Serafina Mitchell, MD;  Location: [place] [facility];  Service: Cardiovascular;;    MEDICATIONS: . aspirin 81 MG tablet  . Azelastine HCl 0.15 % SOLN  . bisoprolol-hydrochlorothiazide (ZIAC) 10-6.25 MG tablet  . cetirizine (ZYRTEC) 10 MG tablet  . diltiazem (CARDIZEM CD) 120 MG 24 hr capsule  . fluticasone (FLOVENT HFA) 220 MCG/ACT inhaler  . furosemide (LASIX) 20 MG  tablet  . losartan (COZAAR) 100 MG tablet  . montelukast (SINGULAIR) 10 MG tablet  . oxybutynin (DITROPAN-XL) 10 MG 24 hr tablet  . oxyCODONE-acetaminophen (PERCOCET) 10-325 MG tablet  . potassium chloride SA (K-DUR,KLOR-CON) 20 MEQ tablet  . rosuvastatin (CRESTOR) 10 MG tablet  . Vitamin D, Ergocalciferol, (DRISDOL) 50000 units CAPS capsule   No current facility-administered medications for this encounter.     George Hugh [facility] Short Stay Center/Anesthesiology Phone [phone] 12/29/2017 4:41 PM

## 2017-12-29 NOTE — H&P (Signed)
[address]       [place] [address]             [phone]      Cardiothoracic Surgical Admission History and Physical   Referring Provider is Alexandra Dresser, MD  PCP is Alexandra [name], [name]      Chief Complaint  Alexandra [name] presents with  . Aortic Stenosis       HPI:  Alexandra [name] is a 67 year old woman with a history of hypertension, hyperlipidemia, ongoing smoking and COPD, pulmonary hypertension, and diastolic congestive heart failure who has known moderate aortic stenosis. An echo in 10/2016 showed a mean aortic valve gradient of 31 mmHg with a dimensionless index of 0.24. She was seen in our office at that time by Dr. Darcey [name] who was seeing her for a right lower lobe nodular density which resolved on subsequent CT scan. She was not having any clear symptoms of congestive heart failure and was therefore referred back to cardiology for follow-up. Over the past several months she has developed exertional fatigue and shortness of breath as well as two-pillow orthopnea. She has been followed by Dr. Aundra [name] and was treated for volume overload with Lasix. She had a follow-up echocardiogram on 10/31/2017 which showed a mean valve gradient of 32 mmHg with a peak of 62 mmHg. The dimensionless index was 0.28. There was mild to moderate aortic insufficiency with a pressure half-time of 515 ms. Left ventricular ejection fraction was 55 to 60% with grade 1 diastolic dysfunction. There is moderate left ventricular hypertrophy. She subsequently underwent right and left heart cath by Dr. Aundra [name] on 11/20/2017. This showed nonobstructive coronary disease. There was mild pulmonary hypertension with a pulmonary vascular resistance of only 3.78 Wood units. Right heart pressures were low.   The Alexandra [name] is retired and lives with her husband. She was a heavy smoker in the past but states she smokes about 1 to 2 cigarettes/day now. She is able to do her normal daily activities in the house without  difficulty although she does not slower. Walking up inclines or lifting heavier objects gives her significant shortness of breath.       Past Medical History:  Diagnosis Date  . COPD (chronic obstructive pulmonary disease) ([place]) 05/22/2016  . Coronary artery disease, non-occlusive 11/28/2016   R&LHC: 65% distal LAD lesion -- likely not angiographically significant->medical management. Normal LVEF. Moderately elevated LVEDP. I aortic valve gradient in the Cath Lab, moderate aortic stenosis. This would suggest that the stenosis is on the moderate side of moderate to severe. Severe pulmonary hypertension (likely mixed). AVA 0.96 cm; P-P gradient ~ 30 mmHg, Mean ~24.5-27.5 (Moderate AS)   . GERD (gastroesophageal reflux disease)   . Hx of adenomatous colonic polyps 10/19/2015  . Hyperlipidemia   . Hypertension   . Moderate mitral regurgitation by prior echocardiography 10/2016   Echocardiogram revealed moderate mitral regurgitation  . Moderate to severe aortic stenosis 10/2016   Echocardiogram showed moderate-severe left ear with a mean-peak gradient of 31 and 64 mmHg.  . Nodule of right lung 07/05/2016   9 mm irregular, possibly spiculated, RIGHT lung base laterally. Rather than repeat CT scan in 3 months, Alexandra [name] elects to consider options for tissue sampling. Resolved on follow-up CT 10/23/2016.  . Pulmonary hypertension associated Aortic Stenosis, Mitral Regurgitation & COPD    Peak PAP by Echo ~54 mmHg; by Cath PAP/Mean 71/32 mmHg, 53 mmHg (with PCWP & LVEDP ~24-26 mmHg); TPG ~26 mmHg  Past Surgical History:  Procedure Laterality Date  . COLONOSCOPY  2003, 2017  . CTA Chest  09/2016   Thoracic Aortic Ca2+ w/o dilation. Coronary Calcification noted. PA normal. Mild Emphysematous changes w/ mild LLL scarring.   [name] FRACTURE SURGERY     2007 -right tib /fib fracture ----07/2009 right femur fx after a fall  . FRACTURE SURGERY Right 2011   Femur  . PERIPHERAL VASCULAR CATHETERIZATION N/A  08/09/2014   Procedure: Aortic Arch Angiography; Surgeon: Alexandra Mitchell, MD; Location: [facility]; Service: Cardiovascular: Type 1 Arch. No significant stenosis, aneurysmal degeneration or dissection. No luminal irregularity seen in R or L SubClavian, Brachial or Radial A. Chronic distal ulnar A occlusion [place].  [name] PERIPHERAL VASCULAR CATHETERIZATION Right 08/09/2014   Procedure: Upper Extremity Angiography; Surgeon: Alexandra Mitchell, MD; Location: [facility]; Service: Cardiovascular; Laterality: Right;  . RIGHT/LEFT HEART CATH AND CORONARY ANGIOGRAPHY N/A 12/06/2016   Procedure: RIGHT/LEFT HEART CATH AND CORONARY ANGIOGRAPHY; Surgeon: Alexandra Man, MD; Location: [facility]: Cor Angio: 65% dLAD (Med Rx). AVA 0.96 cm; P-P gradient ~ 30 mmHg, Mean ~24.5-27.5 (Mod AS); RHC #s: RAP 8 mmHg, RVP/EDP: 71/8/15 mmHg, PCWP: 21-24 mmHg, PAP/mean: 71/32/52 mmHg = Severe Mixed Pulmonary HTN; LVP/EDP 205/17/26 mmHg ; CO/CI by Fick: 4.35, 2.44 - mildly reduced  . RIGHT/LEFT HEART CATH AND CORONARY ANGIOGRAPHY N/A 11/20/2017   Procedure: RIGHT/LEFT HEART CATH AND CORONARY ANGIOGRAPHY; Surgeon: Alexandra Dresser, MD; Location: [facility]; Service: Cardiovascular; Laterality: N/A;  . TRANSTHORACIC ECHOCARDIOGRAM  10/30/2016   Mild Concentric LVH. EF 55-60%. No RWMA, Gr 2 DD. Mod-Severe AS (mean-peak Gradient 31 mmHG - 64 mmHg), Mod MR. Mod LA dilation, Mod PA HTN - peak pressure ~54 mmHg). = Progression of AS from 2014  . ULTRASOUND GUIDANCE FOR VASCULAR ACCESS  08/09/2014   Procedure: Ultrasound Guidance For Vascular Access; Surgeon: Alexandra Mitchell, MD; Location: [facility]; Service: Cardiovascular;;        Family History  Problem Relation Age of Onset  . Hypertension Mother   . Heart disease Mother    She is unaware of the details  . Heart disease Father    Unaware of details  . Heart attack Brother   . Colon cancer Neg Hx   . Colon polyps Neg Hx   . Esophageal  cancer Neg Hx   . Rectal cancer Neg Hx   . Stomach cancer Neg Hx    Social History        Socioeconomic History  . Marital status: Married    Spouse name: Alexandra [name]  . Number of children: 3  . Years of education: 12th grade  . Highest education level: Not on file  Occupational History  . 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 Financial resource strain: Not on file  . Food insecurity:    Worry: Not on file    Inability: Not on file  . Transportation needs:    Medical: Not on file    Non-medical: Not on file  Tobacco Use  . Smoking status: Light Tobacco Smoker    Years: 20.00    Types: Cigarettes  . Smokeless tobacco: Never Used  . Tobacco comment: has quit before- smokes 2 cigs a day   Substance and Sexual Activity  . Alcohol use: No    Alcohol/week: 0.0 standard drinks  . Drug use: No  Comment: +cocaine in urine 12/19/2014  . Sexual activity: Yes    Partners: Male  Lifestyle  . Physical activity:    Days per week: Not on file    Minutes per session: Not on file  . Stress: Not on file  Relationships  . Social connections:    Talks on phone: Not on file    Gets together: Not on file    Attends religious service: Not on file    Active member of club or organization: Not on file    Attends meetings of clubs or organizations: Not on file    Relationship status: Not on file  . Intimate partner violence:    Fear of current or ex partner: Not on file    Emotionally abused: Not on file    Physically abused: Not on file    Forced sexual activity: Not on file  Other Topics Concern  . Not on file  Social History Narrative   Lives with her husband and her middle son.   Her two older sons are products of a previous relationship.   Her youngest son is from her current marriage.   Oldest and youngest sons live nearby.   She has total of 3 children and 6 grandchildren with 2  great-grandchildren.   She does not exercise because she has pain in her right leg.         Current Outpatient Medications  Medication Sig Dispense Refill  . aspirin 81 MG tablet Take 81 mg by mouth daily.     . Azelastine HCl 0.15 % SOLN Place 2 sprays into the nose 2 (two) times daily as needed (allergies). 30 mL PRN  . bisoprolol-hydrochlorothiazide (ZIAC) 10-6.25 MG tablet Take 1 tablet by mouth daily. 90 tablet 0  . cetirizine (ZYRTEC) 10 MG tablet Take 1 tablet (10 mg total) by mouth at bedtime. (Alexandra [name] taking differently: Take 10 mg by mouth 3 times/day as needed-between meals & bedtime. ) 30 tablet PRN  . diltiazem (CARDIZEM CD) 120 MG 24 hr capsule Take 1 capsule (120 mg total) daily after supper by mouth. 90 capsule 3  . fluticasone (FLOVENT HFA) 220 MCG/ACT inhaler Inhale 2 puffs into the lungs 2 (two) times daily. (Alexandra [name] taking differently: Inhale 2 puffs into the lungs 2 (two) times daily as needed (asthma). ) 1 Inhaler 12  . furosemide (LASIX) 20 MG tablet Take 2 tabs in AM 90 tablet 3  . losartan (COZAAR) 100 MG tablet Take 1 tablet (100 mg total) by mouth daily. 90 tablet 3  . montelukast (SINGULAIR) 10 MG tablet Take 1 tablet (10 mg total) by mouth at bedtime. (Alexandra [name] taking differently: Take 10 mg by mouth daily as needed (at bedtime if needed for allergies). ) 30 tablet 3  . oxybutynin (DITROPAN-XL) 10 MG 24 hr tablet TAKE 1 TABLET BY MOUTH ONCE DAILY AT BEDTIME (Alexandra [name] taking differently: Take 10 mg by mouth at bedtime. ) 90 tablet 0  . oxyCODONE-acetaminophen (PERCOCET) 10-325 MG tablet Take 1 tablet by mouth every 4 (four) hours as needed for pain.     . potassium chloride SA (K-DUR,KLOR-CON) 20 MEQ tablet Take 1 tablet (20 mEq total) by mouth daily. 30 tablet 6  . rosuvastatin (CRESTOR) 10 MG tablet Take 1 tablet (10 mg total) by mouth daily. 90 tablet 3  . Vitamin D, Ergocalciferol, (DRISDOL) 50000 units CAPS capsule Take 1 capsule (50,000 Units total) by mouth every 7  (seven) days. 30 capsule 1   No current facility-administered medications  for this visit.        Allergies  Allergen Reactions  . Codeine Nausea Only  . Symproic [Naldemedine] Nausea And Vomiting   Review of Systems:  General: normal appetite, decreased energy, no weight gain, no weight loss, no fever  Cardiac: no chest pain with exertion, no chest pain at rest, +SOB with exertion, no resting SOB, no PND, + orthopnea, no palpitations, no arrhythmia, no atrial fibrillation, no LE edema, no dizzy spells, no syncope  Respiratory: +exertional shortness of breath, no home oxygen, no productive cough, no dry cough, no bronchitis, no wheezing, no hemoptysis, no asthma, no pain with inspiration or cough, no sleep apnea, no CPAP at night  GI: no difficulty swallowing, no reflux, + frequent heartburn, no hiatal hernia, no abdominal pain, no constipation, no diarrhea, no hematochezia, no hematemesis, no melena  GU: no dysuria, no frequency, no urinary tract infection, no hematuria, no kidney stones, no kidney disease  Vascular: + pain suggestive of claudication, no pain in feet, + leg cramps, no varicose veins, no DVT, no non-healing foot ulcer  Neuro: no stroke, no TIA's, no seizures, no headaches, no temporary blindness one eye, no slurred speech, no peripheral neuropathy, no chronic pain, no instability of gait, no memory/cognitive dysfunction  Musculoskeletal: no arthritis, no joint swelling, no myalgias, no difficulty walking, normal mobility  Skin: no rash, no itching, no skin infections, no pressure sores or ulcerations  Psych: no anxiety, no depression, no nervousness, no unusual recent stress  Eyes: no blurry vision, no floaters, no recent vision changes, no glasses or contacts  ENT: no hearing loss, no loose or painful teeth, + dentures but they hurt.  Hematologic: no easy bruising, no abnormal bleeding, no clotting disorder, no frequent epistaxis  Endocrine: no diabetes, does not check CBG's  at home  Physical Exam:  BP (!) 162/79 (BP Location: Right Arm, Alexandra [name] Position: Sitting, Cuff Size: Large)  Pulse 68  Resp 16  Ht 5\' 5"  (1.651 m)  Wt 150 lb 3.2 oz (68.1 kg)  SpO2 97% Comment: ON RA  BMI 24.99 kg/m  General: Well-appearing  HEENT: Unremarkable, NCAT, PERLA, EOMI, oropharynx clear  Neck: no JVD, no bruits, no adenopathy  Chest: clear to auscultation, symmetrical breath sounds, no wheezes, no rhonchi  CV: RRR, grade III/VI crescendo/decrescendo murmur heard best at RSB, no diastolic murmur  Abdomen: soft, non-tender, no masses or organomegaly  Extremities: warm, well-perfused, pulses not palpable, no LE edema  Rectal/GU Deferred  Neuro: Grossly non-focal and symmetrical throughout  Skin: Clean and dry, no rashes, no breakdown  Diagnostic Tests:  [facility]  [address], [place] [address]  [phone]  -------------------------------------------------------------------  Transthoracic Echocardiography  Alexandra [name]: Alexandra [name], Alexandra [name]  MR #: [id number]  Study Date: 10/31/2017  Gender: F  Age: 3  Height: 167.6 cm  Weight: 70.1 kg  BSA: 1.82 m^2  Pt. Status:  Room:  SONOGRAPHER Wyatt Mage, [place] MD  ORDERING Glenetta Hew, MD  REFERRING Glenetta Hew, MD  REFERRING Jacqulynn Cadet, [place], Outpatient  cc:  -------------------------------------------------------------------  LV EF: 55% - 60%  -------------------------------------------------------------------  Indications: Pulmonary hypertension (I27.2).  -------------------------------------------------------------------  History: PMH: Dyspnea. Aortic valve disease. Chronic  obstructive pulmonary disease. Risk factors: Pulmonary  hypertension. PAT. Palpitation. Current tobacco use. Hypertension.  Dyslipidemia.  -------------------------------------------------------------------  Study Conclusions  - Left ventricle: The cavity size was normal. Wall  thickness was  increased in a pattern of moderate LVH. Systolic  function was  normal. The estimated ejection fraction was in the range of 55%  to 60%. Wall motion was normal; there were no regional wall  motion abnormalities. Doppler parameters are consistent with  abnormal left ventricular relaxation (grade 1 diastolic  dysfunction). The E/e&' ratio is between 8-15, suggesting  indeterminate LV filling pressure.  - Aortic valve: Calcified with severe stenosis. Mild to moderate  regurgitation. Mean gradient (S): 32 mm Hg. Peak gradient (S): 62  mm Hg. Valve area (VTI): 0.87 cm^2. Valve area (Vmax): 0.78 cm^2.  Valve area (Vmean): 0.78 cm^2.  - Mitral valve: Calcified annulus. Mildly thickened leaflets .  There was mild to moderate regurgitation.  - Left atrium: The atrium was normal in size.  - Inferior vena cava: The vessel was normal in size. The  respirophasic diameter changes were in the normal range (>= 50%),  consistent with normal central venous pressure.  Impressions:  - Compared to a prior study in 2018, there is now severe aortic  stenosis - mean gradient of 32 mmHg wtih AVA around 0.8 cm2.  -------------------------------------------------------------------  Labs, prior tests, procedures, and surgery:  Transthoracic echocardiography (10/30/2016). The aortic valve  showed moderate stenosis and mild regurgitation. EF was 50% and PA  pressure was 34 (systolic). Aortic valve: peak gradient of 64 mm Hg  and mean gradient of 31 mm Hg.  -------------------------------------------------------------------  Study data: Strain imaging. Comparison was made to the study of  10/30/2016. Study status: Routine. Procedure: The Alexandra [name]  reported no pain pre or post test. Transthoracic echocardiography.  Image quality was adequate. Study completion: There were no  complications. Transthoracic echocardiography. M-mode,  complete 2D, 3D, spectral Doppler, and color Doppler. Birthdate:    Alexandra [name] birthdate: 08-Feb-1951. Age: Alexandra [name] is 67 yr old. Sex:  Gender: female. BMI: 24.9 kg/m^2. Blood pressure: 118/84  Alexandra [name] status: Outpatient. Study date: Study date: 10/31/2017.  Study time: 09:57 AM. Location: [facility]  -------------------------------------------------------------------  -------------------------------------------------------------------  Left ventricle: The cavity size was normal. Wall thickness was  increased in a pattern of moderate LVH. Systolic function was  normal. The estimated ejection fraction was in the range of 55% to  60%. Wall motion was normal; there were no regional wall motion  abnormalities. Doppler parameters are consistent with abnormal left  ventricular relaxation (grade 1 diastolic dysfunction). The E/e&'  ratio is between 8-15, suggesting indeterminate LV filling  pressure.  -------------------------------------------------------------------  Aortic valve: Calcified with severe stenosis. Mild to moderate  regurgitation. Doppler: VTI ratio of LVOT to aortic valve:  0.31. Valve area (VTI): 0.87 cm^2. Indexed valve area (VTI): 0.48  cm^2/m^2. Peak velocity ratio of LVOT to aortic valve: 0.28. Valve  area (Vmax): 0.78 cm^2. Indexed valve area (Vmax): 0.43 cm^2/m^2.  Mean velocity ratio of LVOT to aortic valve: 0.27. Valve area  (Vmean): 0.78 cm^2. Indexed valve area (Vmean): 0.43 cm^2/m^2.  Mean gradient (S): 32 mm Hg. Peak gradient (S): 62 mm Hg.  -------------------------------------------------------------------  Aorta: Aortic root: The aortic root was normal in size.  Ascending aorta: The ascending aorta was normal in size.  -------------------------------------------------------------------  Mitral valve: Calcified annulus. Mildly thickened leaflets .  Doppler: There was mild to moderate regurgitation. Valve area  by pressure half-time: 1.61 cm^2. Indexed valve area by pressure  half-time: 0.89 cm^2/m^2. Valve area by  continuity equation (using  LVOT flow): 2.6 cm^2. Indexed valve area by continuity equation  (using LVOT flow): 1.43 cm^2/m^2. Mean gradient (D): 3 mm Hg.  Peak gradient (D): 2 mm Hg.  -------------------------------------------------------------------  Left atrium: The atrium was normal in size.  -------------------------------------------------------------------  Atrial septum: No defect or patent foramen ovale was identified.  -------------------------------------------------------------------  Right ventricle: The cavity size was normal. Wall thickness was  normal. Systolic function was normal.  -------------------------------------------------------------------  Pulmonic valve: The valve appears to be grossly normal.  Doppler: There was no significant regurgitation.  -------------------------------------------------------------------  Tricuspid valve: Doppler: There was no significant  regurgitation.  -------------------------------------------------------------------  Pulmonary artery: The main pulmonary artery was normal-sized.  -------------------------------------------------------------------  Right atrium: The atrium was normal in size.  -------------------------------------------------------------------  Pericardium: There was no pericardial effusion.  -------------------------------------------------------------------  Systemic veins:  Inferior vena cava: The vessel was normal in size. The  respirophasic diameter changes were in the normal range (>= 50%),  consistent with normal central venous pressure.  -------------------------------------------------------------------  Measurements  Left ventricle Value Reference  LV ID, ED, PLAX chordal 49 mm 43 - 52  LV ID, ES, PLAX chordal 34 mm 23 - 38  LV fx shortening, PLAX chordal 31 % >=29  LV PW thickness, ED 10 mm ---------  IVS/LV PW ratio, ED (H) 1.4 <=1.3  Stroke volume, 2D 77 ml ---------  Stroke volume/bsa, 2D 42  ml/m^2 ---------  LV e&', lateral 5.63 cm/s ---------  LV E/e&', lateral 13.78 ---------  LV e&', medial 3.53 cm/s ---------  LV E/e&', medial 21.98 ---------  LV e&', average 4.58 cm/s ---------  LV E/e&', average 16.94 ---------  Ventricular septum Value Reference  IVS thickness, ED 14 mm ---------  LVOT Value Reference  LVOT ID, S 19 mm ---------  LVOT area 2.84 cm^2 ---------  LVOT peak velocity, S 109 cm/s ---------  LVOT mean velocity, S 72.9 cm/s ---------  LVOT VTI, S 27 cm ---------  Aortic valve Value Reference  Aortic valve peak velocity, S 395 cm/s ---------  Aortic valve mean velocity, S 266 cm/s ---------  Aortic valve VTI, S 88.2 cm ---------  Aortic mean gradient, S 32 mm Hg ---------  Aortic peak gradient, S 62 mm Hg ---------  VTI ratio, LVOT/AV 0.31 ---------  Aortic valve area, VTI 0.87 cm^2 ---------  Aortic valve area/bsa, VTI 0.48 cm^2/m^2 ---------  Velocity ratio, peak, LVOT/AV 0.28 ---------  Aortic valve area, peak velocity 0.78 cm^2 ---------  Aortic valve area/bsa, peak 0.43 cm^2/m^2 ---------  velocity  Velocity ratio, mean, LVOT/AV 0.27 ---------  Aortic valve area, mean velocity 0.78 cm^2 ---------  Aortic valve area/bsa, mean 0.43 cm^2/m^2 ---------  velocity  Aortic regurg pressure half-time 515 ms ---------  Aorta Value Reference  Aortic root ID, ED 37 mm ---------  Ascending aorta ID, A-P, S 33 mm ---------  Left atrium Value Reference  LA ID, A-P, ES 43 mm ---------  LA ID/bsa, A-P (H) 2.37 cm/m^2 <=2.2  LA volume, S 46.5 ml ---------  LA volume/bsa, S 25.6 ml/m^2 ---------  LA volume, ES, 1-p A4C 39 ml ---------  LA volume/bsa, ES, 1-p A4C 21.5 ml/m^2 ---------  LA volume, ES, 1-p A2C 53.8 ml ---------  LA volume/bsa, ES, 1-p A2C 29.6 ml/m^2 ---------  Mitral valve Value Reference  Mitral E-wave peak velocity 77.6 cm/s ---------  Mitral A-wave peak velocity 130 cm/s ---------  Mitral mean velocity, D 73.7 cm/s ---------  Mitral  deceleration time (H) 433 ms 150 - 230  Mitral pressure half-time 146 ms ---------  Mitral mean gradient, D 3 mm Hg ---------  Mitral peak gradient, D 2 mm Hg ---------  Mitral E/A ratio, peak 0.6 ---------  Mitral valve area, PHT, DP 1.61  cm^2 ---------  Mitral valve area/bsa, PHT, DP 0.89 cm^2/m^2 ---------  Mitral valve area, LVOT 2.6 cm^2 ---------  continuity  Mitral valve area/bsa, LVOT 1.43 cm^2/m^2 ---------  continuity  Mitral annulus VTI, D 29.5 cm ---------  Mitral regurg VTI, PISA 227 cm ---------  Mitral ERO, PISA 0.13 cm^2 ---------  Mitral regurg volume, PISA 30 ml ---------  Systemic veins Value Reference  Estimated CVP 3 mm Hg ---------  Right ventricle Value Reference  TAPSE 20.7 mm ---------  RV s&', lateral, S 10.9 cm/s ---------  Legend:  (L) and (H) mark values outside specified reference range.  -------------------------------------------------------------------  Prepared and Electronically Authenticated by  Lyman Bishop MD  2019-08-09T15:39:08  Physicians  Panel Physicians Referring Physician Case Authorizing Physician  Alexandra Dresser, MD (Primary)    Procedures  RIGHT/LEFT HEART CATH AND CORONARY ANGIOGRAPHY  Conclusion  1. Low filling pressures.  2. Mild pulmonary hypertension, suspect group 3 PH due to COPD. PVR only 3.78 WU.  3. Nonobstructive CAD.  I did not cross the aortic valve as I am fairly certain that aortic stenosis is severe based on echo and symptoms.  Alexandra [name] should proceed with TAVR. No treatment at this time for mild pulmonary hypertension. She can decrease Lasix to 40 mg once daily.   Procedural Details/Technique  Technical Details Procedure: Right Heart Cath, Selective Coronary Angiography  Indication: Severe aortic stenosis, pre-TAVR   Procedural Details: The right brachial and radial areas were prepped, draped, and anesthetized with 1% lidocaine. There was a pre-existing peripheral IV that was replaced with a 45F venous sheath.  A Swan-Ganz catheter was used for the right heart catheterization. Standard protocol was followed for recording of right heart pressures and sampling of oxygen saturations. Fick cardiac output was calculated. The right radial artery was entered using modified Seldinger technique and a 82F sheath was placed. The Alexandra [name] received 3 mg IA verapamil and weight-based IV heparin. Standard Judkins catheters were used for selective coronary angiography. There were no immediate procedural complications. The Alexandra [name] was transferred to the post catheterization recovery area for further monitoring.    Estimated blood loss <50 mL.  During this procedure the Alexandra [name] was administered the following to achieve and maintain moderate conscious sedation: Versed 2 mg, Fentanyl 50 mcg, while the Alexandra [name]'s heart rate, blood pressure, and oxygen saturation were continuously monitored. The period of conscious sedation was 26 minutes, of which I was present face-to-face 100% of this time.  Coronary Findings  Diagnostic  Dominance: Right  Left Main  No significant coronary disease.  Left Anterior Descending  40% distal LAD stenosis.  Ramus Intermedius  Small to moderate vessel, no significant disease.  Left Circumflex  No significant coronary disease.  Right Coronary Artery  30% proximal RCA stenosis.  Intervention  No interventions have been documented.  Right Heart  Right Heart Pressures RHC Procedural Findings: Hemodynamics (mmHg) RA 4 RV 39/2 PA 39/8, mean 21 PCWP mean 5  Oxygen saturations: PA 67% AO 97%  Cardiac Output (Fick) 4.28  Cardiac Index (Fick) 2.45 PVR 3.78 WU  Implants     No implant documentation for this case.  MERGE Images  Link to Procedure Log   Show images for CARDIAC CATHETERIZATION Procedure Log  Hemo Data   Most Recent Value  Fick Cardiac Output 4.28 L/min  Fick Cardiac Output Index 2.45 (L/min)/BSA  RA A Wave 4 mmHg  RA V Wave 0 mmHg  RA Mean 0 mmHg  RV Systolic  Pressure 39 mmHg  RV Diastolic  Pressure -3 mmHg  RV EDP 2 mmHg  PA Systolic Pressure 39 mmHg  PA Diastolic Pressure 8 mmHg  PA Mean 21 mmHg  PW A Wave 8 mmHg  PW V Wave 7 mmHg  PW Mean 5 mmHg  AO Systolic Pressure 224 mmHg  AO Diastolic Pressure 59 mmHg  AO Mean 92 mmHg  QP/QS 1  TPVR Index 8.56 HRUI  TSVR Index 37.51 HRUI  TPVR/TSVR Ratio 0.23  ADDENDUM REPORT: 12/18/2017 12:33  CLINICAL DATA: Aortic stenosis  EXAM:  Cardiac TAVR CT  TECHNIQUE:  The Alexandra [name] was scanned on a [employer] scanner. A 120 kV  retrospective scan was triggered in the descending thoracic aorta at  111 HU's. Gantry rotation speed was 270 msecs and collimation was .9  mm. No beta blockade or nitro were given. The 3D data set was  reconstructed in 5% intervals of the R-R cycle. Systolic and  diastolic phases were analyzed on a dedicated work station using  MPR, MIP and VRT modes. The Alexandra [name] received 80 cc of contrast.  FINDINGS:  Aortic Valve: Tri leaflet and calcified with restricted leaflet  motion  Aorta: Moderate calcific aortic debris  Sinotubular Junction: 25 mm  Ascending Thoracic Aorta: 33 mm  Aortic Arch: 24 mm  Descending Thoracic Aorta: 26 mm  Sinus of Valsalva Measurements:  Non-coronary: 29 mm  Right -coronary: 29 mm  Left -coronary: 29.5 mm  Coronary Artery Height above Annulus:  Left Main: 10.4 mm above annulus  Right Coronary: 15.4 mm above annulus  Virtual Basal Annulus Measurements:  Maximum/Minimum Diameter: 27.2 x 18.6 mm  Perimeter: 75 mm  Area: 423 mm2  Coronary Arteries: Sufficient height above annulus for deployment  Optimum Fluoroscopic Angle for Delivery: LAO 12 Caudal 12 degrees  IMPRESSION:  1. Calcified tri leaflet AV with annular area of 423 mm2 suitable  for a 23 mm Sapien 3 valve  2. No LAA thrombus  3. Optimum angle for deployment LAO 12 Caudal 12 degrees  4. Coronary arteries sufficient height above annulus for deployment  Jenkins Rouge  Electronically  Signed  By: Jenkins Rouge M.D.  On: 12/18/2017 12:33  CLINICAL DATA: 67 year old female with history of severe aortic  stenosis. Preprocedural study prior to potential transcatheter  aortic valve replacement (TAVR) procedure.  EXAM:  CT ANGIOGRAPHY CHEST, ABDOMEN AND PELVIS  TECHNIQUE:  Multidetector CT imaging through the chest, abdomen and pelvis was  performed using the standard protocol during bolus administration of  intravenous contrast. Multiplanar reconstructed images and MIPs were  obtained and reviewed to evaluate the vascular anatomy.  CONTRAST: 144mL ISOVUE-370 IOPAMIDOL (ISOVUE-370) INJECTION 76%  COMPARISON: Chest CT 10/11/2016.  FINDINGS:  CTA CHEST FINDINGS  Cardiovascular: Heart size is borderline enlarged. There is no  significant pericardial fluid, thickening or pericardial  calcification. There is aortic atherosclerosis, as well as  atherosclerosis of the great vessels of the mediastinum and the  coronary arteries, including calcified atherosclerotic plaque in the  left anterior descending, left circumflex and right coronary  arteries. Severe thickening and calcification of the aortic valve.  Calcifications of the mitral annulus.  Mediastinum/Lymph Nodes: No pathologically enlarged mediastinal or  hilar lymph nodes. Esophagus is unremarkable in appearance. No  axillary lymphadenopathy.  Lungs/Pleura: A few small pulmonary nodules are noted throughout the  lungs bilaterally, largest of which is in the anterior aspect of the  superior segment of the right lower lobe (axial image 48 of series  7) abutting the major fissure measuring 5 mm. No larger more  suspicious appearing pulmonary nodules or masses are noted. No acute  consolidative airspace disease. No pleural effusions. Dependent  areas of atelectasis are noted in the lower lobes of the lungs  bilaterally.  Musculoskeletal/Soft Tissues: There are no aggressive appearing  lytic or blastic lesions noted in the  visualized portions of the  skeleton.  CTA ABDOMEN AND PELVIS FINDINGS  Hepatobiliary: No suspicious cystic or solid hepatic lesions. No  intra or extrahepatic biliary ductal dilatation. Status post  cholecystectomy.  Pancreas: No pancreatic mass. No pancreatic ductal dilatation. No  pancreatic or peripancreatic fluid or inflammatory changes.  Spleen: Unremarkable.  Adrenals/Urinary Tract: Multiple low-attenuation lesions in both  kidneys, compatible with simple cysts, largest of which is 2.4 cm in  the periphery of the lower pole the left kidney. Other subcentimeter  low-attenuation lesions are noted in the kidneys bilaterally, too  small to characterize, but statistically likely to represent tiny  cysts. Small nonobstructive calculi are noted within the collecting  systems of the kidneys bilaterally, largest of which is in the lower  pole of the right kidney measuring 3 mm. Bilateral adrenal glands  are normal. No hydroureteronephrosis. Urinary bladder is normal in  appearance.  Stomach/Bowel: Normal appearance of the stomach. No pathologic  dilatation of small bowel or colon. Numerous colonic diverticulae  are noted, without surrounding inflammatory changes to suggest an  acute diverticulitis at this time. The appendix is not confidently  identified and may be surgically absent. Regardless, there are no  inflammatory changes noted adjacent to the cecum to suggest the  presence of an acute appendicitis at this time.  Vascular/Lymphatic: Aortic atherosclerosis, with vascular findings  and measurements pertinent to potential TAVR procedure, as detailed  below. Multifocal fusiform ectasia of the abdominal aorta. Complete  occlusion of the right internal iliac artery at its origin. Complete  occlusion of the left internal iliac artery shortly after its  origin. Complete occlusion of both superficial femoral arteries. No  lymphadenopathy noted in the abdomen or pelvis.  Reproductive:  Uterus and ovaries are atrophic.  Other: No significant volume of ascites. No pneumoperitoneum.  Musculoskeletal: There are no aggressive appearing lytic or blastic  lesions noted in the visualized portions of the skeleton.  VASCULAR MEASUREMENTS PERTINENT TO TAVR:  AORTA:  Minimal Aortic Diameter-11 x 11 mm  Severity of Aortic Calcification-severe  RIGHT PELVIS:  Right Common Iliac Artery -  Minimal Diameter-5.1 x 4.7 mm  Tortuosity-mild  Calcification-severe  Right External Iliac Artery -  Minimal Diameter-3.7 x 3.0 mm  Tortuosity-mild  Calcification-mild  Right Common Femoral Artery -  Minimal Diameter-4.4 x 4.2 mm  Tortuosity-mild  Calcification-mild  LEFT PELVIS:  Left Common Iliac Artery -  Minimal Diameter-7.2 x 6.0 mm  Tortuosity-mild  Calcification-severe  Left External Iliac Artery -  Minimal Diameter-4.9 x 4.9 mm  Tortuosity-mild  Calcification-moderate  Left Common Femoral Artery -  Minimal Diameter-6.7 x 6.6 mm  Tortuosity-mild  Calcification-mild  Review of the MIP images confirms the above findings.  IMPRESSION:  1. Vascular findings and measurements pertinent to potential TAVR  procedure, as detailed above.  2. Severe thickening calcification of the aortic valve, compatible  with the reported clinical history of severe aortic stenosis.  3. Aortic atherosclerosis, in addition to three-vessel coronary  artery disease. Please note that although the presence of coronary  artery calcium documents the presence of coronary artery disease,  the severity of this disease and any potential stenosis cannot be  assessed on this non-gated CT examination. Assessment for  potential  risk factor modification, dietary therapy or pharmacologic therapy  may be warranted, if clinically indicated.  4. Multiple small pulmonary nodules noted throughout the lungs  bilaterally, largest of which measures 5 mm in the right lower lobe.  No follow-up needed if Alexandra [name] is low-risk  (and has no known or  suspected primary neoplasm). Non-contrast chest CT can be considered  in 12 months if Alexandra [name] is high-risk. This recommendation follows  the consensus statement: Guidelines for Management of Incidental  Pulmonary Nodules Detected on CT Images: From the Fleischner Society  2017; Radiology 2017; 284:228-243.  5. Colonic diverticulosis without evidence of acute diverticulitis  at this time.  6. Additional incidental findings, as above.  Electronically Signed  By: Vinnie Langton M.D.  On: 12/18/2017 14:37  STS Risk Score:  Procedure: Isolated AVR  Risk of Mortality: 1.894%  Renal Failure: 1.209%  Permanent Stroke: 0.895%  Prolonged Ventilation: 6.528%  DSW Infection: 0.069%  Reoperation: 2.318%  Morbidity or Mortality: 9.547%  Short Length of Stay: 44.583%  Long Length of Stay: 4.044%   Impression:   This 67 year old woman has stage D, severe, symptomatic aortic stenosis with New York Heart Association class II symptoms of exertional fatigue and shortness of breath consistent with chronic diastolic congestive heart failure. I have personally reviewed her echocardiogram, cardiac catheterization, and CTA studies. Echocardiogram shows a trileaflet aortic valve with severe calcification of the valve leaflets with a mean gradient of 32 mmHg and a dimensionless index of 0.28 consistent with severe aortic stenosis. There is mild to moderate aortic insufficiency. Cardiac catheterization shows nonobstructive coronary disease. I agree that aortic valve replacement is indicated in this Alexandra [name]. She is a low risk surgical Alexandra [name] but would rather undergo transcatheter aortic valve replacement then open surgical replacement. I think she is a reasonable candidate for that. Her gated cardiac CTA shows anatomy that is suitable for transcatheter aortic valve replacement without any complicating features. Her abdominal and pelvic CTA shows significant calcific atherosclerosis of the aorta  and iliac vessels but I think she probably has adequate left iliac and femoral vessels to allow transfemoral insertion. She has significant calcific atherosclerosis of the aortic arch particularly around the takeoff of the great vessels so if alternative access is required I think we would need to perform transapical insertion. I discussed this with her.  The Alexandra [name] was counseled at length regarding treatment alternatives for management of severe symptomatic aortic stenosis. The risks and benefits of surgical intervention has been discussed in detail. Long-term prognosis with medical therapy was discussed. Alternative approaches such as conventional surgical aortic valve replacement, transcatheter aortic valve replacement, and palliative medical therapy were compared and contrasted at length. This discussion was placed in the context of the Alexandra [name]'s own specific clinical presentation and past medical history. All of her questions have been addressed.   Following the decision to proceed with transcatheter aortic valve replacement, a discussion was held regarding what types of management strategies would be attempted intraoperatively in the event of life-threatening complications, including whether or not the Alexandra [name] would be considered a candidate for the use of cardiopulmonary bypass and/or conversion to open sternotomy for attempted surgical intervention. The Alexandra [name] has been advised of a variety of complications that might develop including but not limited to risks of death, stroke, paravalvular leak, aortic dissection or other major vascular complications, aortic annulus rupture, device embolization, cardiac rupture or perforation, mitral regurgitation, acute myocardial infarction, arrhythmia, heart block or bradycardia requiring permanent pacemaker placement, congestive heart failure, respiratory failure,  renal failure, pneumonia, infection, other late complications related to structural valve deterioration  or migration, or other complications that might ultimately cause a temporary or permanent loss of functional independence or other long term morbidity. The Alexandra [name] provides full informed consent for the procedure as described and all questions were answered.   Plan:   Transfemoral transcatheter aortic valve replacement using a Sapien 3 valve on Tuesday, 12/30/2017.    Gaye Pollack, MD

## 2017-12-29 NOTE — Anesthesia Preprocedure Evaluation (Addendum)
Anesthesia Evaluation  Patient identified by MRN, date of birth, ID band Patient awake    Reviewed: Allergy & Precautions, NPO status , Patient's Chart, lab work & pertinent test results, reviewed documented beta blocker date and time   History of Anesthesia Complications Negative for: history of anesthetic complications  Airway Mallampati: II  TM Distance: >3 FB Neck ROM: Full    Dental  (+) Edentulous Upper, Edentulous Lower   Pulmonary COPD,  COPD inhaler, Current Smoker,    breath sounds clear to auscultation + decreased breath sounds      Cardiovascular hypertension, Pt. on medications and Pt. on home beta blockers (-) angina+ CAD (non-obstructive LAD) and + DOE  + Valvular Problems/Murmurs AS  Rhythm:Regular Rate:Normal  8/19 ECHO: EF 55-60%, Aortic valve: Calcified with severe stenosis. Mild to moderate AI. Mean gradient (S): 32 mm Hg. Peak gradient (S): 62 mm Hg. Valve area (VTI): 0.87 cm^2, mild-mod MR   Neuro/Psych Anxiety negative neurological ROS     GI/Hepatic Neg liver ROS, GERD  Poorly Controlled,  Endo/Other  negative endocrine ROS  Renal/GU negative Renal ROS     Musculoskeletal  (+) Arthritis ,   Abdominal   Peds  Hematology negative hematology ROS (+)   Anesthesia Other Findings   Reproductive/Obstetrics                           Anesthesia Physical Anesthesia Plan  ASA: III  Anesthesia Plan: MAC   Post-op Pain Management:    Induction:   PONV Risk Score and Plan: 1 and Ondansetron  Airway Management Planned: Simple Face Mask and Natural Airway  Additional Equipment: Arterial line, CVP and Ultrasound Guidance Line Placement  Intra-op Plan:   Post-operative Plan:   Informed Consent: I have reviewed the patients History and Physical, chart, labs and discussed the procedure including the risks, benefits and alternatives for the proposed anesthesia with the  patient or authorized representative who has indicated his/her understanding and acceptance.     Plan Discussed with:   Anesthesia Plan Comments: (Per Dr. 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 Myra Gianotti, PA-C)      Anesthesia Quick Evaluation

## 2017-12-29 NOTE — Pre-Procedure Instructions (Signed)
Taura Lamarre Castleman  12/29/2017      [facility], [place] AT [place] [address] [place] [address] Phone: [phone] Fax: [phone]  [facility] [phone] - [place], [address] AT [place] [address] Phone: [phone] Fax: [phone]    Your procedure is scheduled on Tuesday, Oct. 8th   Report to [facility] Admitting at 10:00 AM             (posted procedural time 12:28p - 3:28p)             (arrival time is per your doctor's request.)   Call this number if you have problems the morning of surgery:  [phone]   Remember:   Do not eat any foods or drink any liquids after midnight, tonight.    Take these medicines the morning of surgery with A SIP OF WATER : NONE    Do not wear jewelry, make-up or nail polish.  Do not wear lotions, powders, perfumes, or deodorant.  Do not shave 48 hours prior to surgery.   Do not bring valuables to the hospital.  [facility] is not responsible for any belongings or valuables.  Contacts, dentures or bridgework may not be worn into surgery.  Leave your suitcase in the car.  After surgery it may be brought to your room.  For patients admitted to the hospital, discharge time will be determined by your treatment team.  Please read over the following fact sheets that you were given. Surgical Site Infection Prevention     [facility]- Preparing For Surgery  Before surgery, you can play an important role. Because skin is not sterile, your skin needs to be as free of germs as possible. You can reduce the number of germs on your skin by washing with CHG (chlorahexidine gluconate) Soap before surgery.  CHG is an antiseptic cleaner which kills germs and bonds with the skin to continue killing germs even after washing.    Oral Hygiene is also important to reduce your  risk of infection.    Remember - BRUSH YOUR TEETH THE MORNING OF SURGERY WITH YOUR REGULAR TOOTHPASTE  Please do not use if you have an allergy to CHG or antibacterial soaps. If your skin becomes reddened/irritated stop using the CHG.  Do not shave (including legs and underarms) for at least 48 hours prior to first CHG shower. It is OK to shave your face.  Please follow these instructions carefully.   1. Shower the NIGHT BEFORE SURGERY and the MORNING OF SURGERY with CHG.   2. If you chose to wash your hair, wash your hair first as usual with your normal shampoo.  3. After you shampoo, rinse your hair and body thoroughly to remove the shampoo.  4. Use CHG as you would any other liquid soap. You can apply CHG directly to the skin and wash gently with a scrungie or a clean washcloth.   5. Apply the CHG Soap to your body ONLY FROM THE NECK DOWN.  Do not use on open wounds or open sores. Avoid contact with your eyes, ears, mouth and genitals (private parts). Wash Face and genitals (private parts)  with your normal soap.  6. Wash thoroughly, paying special attention to the area where your surgery will be performed.  7. Thoroughly rinse your body with warm water from the neck down.  8. DO NOT shower/wash with your normal soap after using and rinsing off the CHG Soap.  9. Pat yourself dry with a CLEAN TOWEL.  10. Wear CLEAN PAJAMAS to bed the night before surgery, wear comfortable clothes the morning of surgery  11. Place CLEAN SHEETS on your bed the night of your first shower and DO NOT SLEEP WITH PETS. 12.  Day of Surgery:  Do not apply any deodorants/lotions.   Please wear clean clothes to the hospital/surgery center.   Remember to brush your teeth WITH YOUR REGULAR TOOTHPASTE.

## 2017-12-30 ENCOUNTER — Inpatient Hospital Stay (HOSPITAL_COMMUNITY): Payer: Medicare HMO | Admitting: Anesthesiology

## 2017-12-30 ENCOUNTER — Encounter (HOSPITAL_COMMUNITY): Payer: Self-pay

## 2017-12-30 ENCOUNTER — Inpatient Hospital Stay (HOSPITAL_COMMUNITY): Payer: Medicare HMO

## 2017-12-30 ENCOUNTER — Inpatient Hospital Stay (HOSPITAL_COMMUNITY): Payer: Medicare HMO | Admitting: Vascular Surgery

## 2017-12-30 ENCOUNTER — Inpatient Hospital Stay (HOSPITAL_COMMUNITY)
Admission: RE | Admit: 2017-12-30 | Discharge: 2017-12-31 | DRG: 266 | Disposition: A | Discharge status: Home / Self Care | Payer: Medicare HMO | Attending: Surgery | Admitting: Surgery

## 2017-12-30 ENCOUNTER — Ambulatory Visit (HOSPITAL_COMMUNITY): Payer: Medicare HMO

## 2017-12-30 ENCOUNTER — Encounter (HOSPITAL_COMMUNITY)
Admission: RE | Disposition: A | Discharge status: Home / Self Care | Payer: Self-pay | Source: Home / Self Care | Attending: Surgery

## 2017-12-30 ENCOUNTER — Other Ambulatory Visit: Payer: Self-pay | Admitting: Physician Assistant

## 2017-12-30 DIAGNOSIS — I447 Left bundle-branch block, unspecified: Secondary | ICD-10-CM | POA: Diagnosis present

## 2017-12-30 DIAGNOSIS — Z888 Allergy status to other drugs, medicaments and biological substances status: Secondary | ICD-10-CM | POA: Diagnosis not present

## 2017-12-30 DIAGNOSIS — Z7982 Long term (current) use of aspirin: Secondary | ICD-10-CM

## 2017-12-30 DIAGNOSIS — R5382 Chronic fatigue, unspecified: Secondary | ICD-10-CM | POA: Diagnosis present

## 2017-12-30 DIAGNOSIS — I700 Atherosclerosis of aorta: Secondary | ICD-10-CM | POA: Diagnosis present

## 2017-12-30 DIAGNOSIS — I2510 Atherosclerotic heart disease of native coronary artery without angina pectoris: Secondary | ICD-10-CM | POA: Diagnosis present

## 2017-12-30 DIAGNOSIS — Z8249 Family history of ischemic heart disease and other diseases of the circulatory system: Secondary | ICD-10-CM | POA: Diagnosis not present

## 2017-12-30 DIAGNOSIS — J449 Chronic obstructive pulmonary disease, unspecified: Secondary | ICD-10-CM | POA: Diagnosis present

## 2017-12-30 DIAGNOSIS — R918 Other nonspecific abnormal finding of lung field: Secondary | ICD-10-CM | POA: Diagnosis present

## 2017-12-30 DIAGNOSIS — G894 Chronic pain syndrome: Secondary | ICD-10-CM | POA: Diagnosis present

## 2017-12-30 DIAGNOSIS — Z885 Allergy status to narcotic agent status: Secondary | ICD-10-CM

## 2017-12-30 DIAGNOSIS — Z86010 Personal history of colonic polyps: Secondary | ICD-10-CM | POA: Diagnosis not present

## 2017-12-30 DIAGNOSIS — R911 Solitary pulmonary nodule: Secondary | ICD-10-CM | POA: Diagnosis present

## 2017-12-30 DIAGNOSIS — K5730 Diverticulosis of large intestine without perforation or abscess without bleeding: Secondary | ICD-10-CM | POA: Diagnosis present

## 2017-12-30 DIAGNOSIS — Z006 Encounter for examination for normal comparison and control in clinical research program: Secondary | ICD-10-CM

## 2017-12-30 DIAGNOSIS — I350 Nonrheumatic aortic (valve) stenosis: Secondary | ICD-10-CM

## 2017-12-30 DIAGNOSIS — Z9049 Acquired absence of other specified parts of digestive tract: Secondary | ICD-10-CM | POA: Diagnosis not present

## 2017-12-30 DIAGNOSIS — M8580 Other specified disorders of bone density and structure, unspecified site: Secondary | ICD-10-CM | POA: Diagnosis present

## 2017-12-30 DIAGNOSIS — I110 Hypertensive heart disease with heart failure: Secondary | ICD-10-CM | POA: Diagnosis present

## 2017-12-30 DIAGNOSIS — I361 Nonrheumatic tricuspid (valve) insufficiency: Secondary | ICD-10-CM | POA: Diagnosis not present

## 2017-12-30 DIAGNOSIS — I10 Essential (primary) hypertension: Secondary | ICD-10-CM | POA: Diagnosis present

## 2017-12-30 DIAGNOSIS — F17210 Nicotine dependence, cigarettes, uncomplicated: Secondary | ICD-10-CM | POA: Diagnosis present

## 2017-12-30 DIAGNOSIS — K219 Gastro-esophageal reflux disease without esophagitis: Secondary | ICD-10-CM | POA: Diagnosis present

## 2017-12-30 DIAGNOSIS — Z79899 Other long term (current) drug therapy: Secondary | ICD-10-CM

## 2017-12-30 DIAGNOSIS — Z952 Presence of prosthetic heart valve: Secondary | ICD-10-CM

## 2017-12-30 DIAGNOSIS — I5033 Acute on chronic diastolic (congestive) heart failure: Secondary | ICD-10-CM | POA: Diagnosis present

## 2017-12-30 DIAGNOSIS — F17200 Nicotine dependence, unspecified, uncomplicated: Secondary | ICD-10-CM | POA: Diagnosis present

## 2017-12-30 DIAGNOSIS — I2721 Secondary pulmonary arterial hypertension: Secondary | ICD-10-CM | POA: Diagnosis present

## 2017-12-30 DIAGNOSIS — E785 Hyperlipidemia, unspecified: Secondary | ICD-10-CM | POA: Diagnosis present

## 2017-12-30 DIAGNOSIS — I5032 Chronic diastolic (congestive) heart failure: Secondary | ICD-10-CM

## 2017-12-30 HISTORY — DX: Other nonspecific abnormal finding of lung field: R91.8

## 2017-12-30 HISTORY — PX: TRANSCATHETER AORTIC VALVE REPLACEMENT, TRANSFEMORAL: SHX6400

## 2017-12-30 HISTORY — PX: TEE WITHOUT CARDIOVERSION: SHX5443

## 2017-12-30 HISTORY — DX: Presence of prosthetic heart valve: Z95.2

## 2017-12-30 HISTORY — DX: Nonrheumatic aortic (valve) stenosis: I35.0

## 2017-12-30 LAB — POCT I-STAT, CHEM 8
BUN: 11 mg/dL (ref 8–23)
BUN: 11 mg/dL (ref 8–23)
BUN: 10 mg/dL (ref 8–23)
CHLORIDE: 107 mmol/L (ref 98–111)
CREATININE: 0.8 mg/dL (ref 0.44–1.00)
Calcium, Ion: 1.27 mmol/L (ref 1.15–1.40)
Calcium, Ion: 1.2 mmol/L (ref 1.15–1.40)
Calcium, Ion: 1.24 mmol/L (ref 1.15–1.40)
Chloride: 105 mmol/L (ref 98–111)
Chloride: 107 mmol/L (ref 98–111)
Creatinine, Ser: 0.8 mg/dL (ref 0.44–1.00)
Creatinine, Ser: 0.7 mg/dL (ref 0.44–1.00)
GLUCOSE: 111 mg/dL — AB (ref 70–99)
Glucose, Bld: 96 mg/dL (ref 70–99)
Glucose, Bld: 116 mg/dL — ABNORMAL HIGH (ref 70–99)
HCT: 30 % — ABNORMAL LOW (ref 36.0–46.0)
HEMATOCRIT: 31 % — AB (ref 36.0–46.0)
HEMATOCRIT: 32 % — AB (ref 36.0–46.0)
HEMOGLOBIN: 10.9 g/dL — AB (ref 12.0–15.0)
Hemoglobin: 10.5 g/dL — ABNORMAL LOW (ref 12.0–15.0)
Hemoglobin: 10.2 g/dL — ABNORMAL LOW (ref 12.0–15.0)
POTASSIUM: 3.8 mmol/L (ref 3.5–5.1)
POTASSIUM: 3.8 mmol/L (ref 3.5–5.1)
POTASSIUM: 3.7 mmol/L (ref 3.5–5.1)
SODIUM: 141 mmol/L (ref 135–145)
Sodium: 142 mmol/L (ref 135–145)
Sodium: 142 mmol/L (ref 135–145)
TCO2: 25 mmol/L (ref 22–32)
TCO2: 24 mmol/L (ref 22–32)
TCO2: 23 mmol/L (ref 22–32)

## 2017-12-30 SURGERY — IMPLANTATION, AORTIC VALVE, TRANSCATHETER, FEMORAL APPROACH
Anesthesia: Monitor Anesthesia Care | Site: Chest

## 2017-12-30 MED ORDER — ONDANSETRON HCL 4 MG/2ML IJ SOLN
4.0000 mg | Freq: Four times a day (QID) | INTRAMUSCULAR | Status: DC | PRN
  Administered 2017-12-30: 4 mg via INTRAVENOUS
  Filled 2017-12-30: qty 2

## 2017-12-30 MED ORDER — SODIUM CHLORIDE 0.9 % IV SOLN
INTRAVENOUS | Status: DC | PRN
  Administered 2017-12-30: 1500 mL

## 2017-12-30 MED ORDER — ASPIRIN 81 MG PO CHEW
81.0000 mg | CHEWABLE_TABLET | Freq: Every day | ORAL | Status: DC
  Administered 2017-12-31: 81 mg via ORAL
  Filled 2017-12-30: qty 1

## 2017-12-30 MED ORDER — SODIUM CHLORIDE 0.9% FLUSH: 3.0000 mL | INTRAVENOUS | Status: DC | PRN

## 2017-12-30 MED ORDER — SODIUM CHLORIDE 0.9 % IV SOLN
INTRAVENOUS | Status: DC | PRN
  Administered 2017-12-30: 25 ug/min via INTRAVENOUS

## 2017-12-30 MED ORDER — ACETAMINOPHEN 325 MG PO TABS
650.0000 mg | ORAL_TABLET | Freq: Four times a day (QID) | ORAL | Status: DC | PRN
  Administered 2017-12-31: 650 mg via ORAL
  Filled 2017-12-30: qty 2

## 2017-12-30 MED ORDER — SODIUM CHLORIDE 0.9 % IV SOLN: INTRAVENOUS | Status: AC

## 2017-12-30 MED ORDER — MORPHINE SULFATE (PF) 2 MG/ML IV SOLN: 2.0000 mg | INTRAVENOUS | Status: DC | PRN

## 2017-12-30 MED ORDER — CHLORHEXIDINE GLUCONATE 4 % EX LIQD: 30.0000 mL | CUTANEOUS | Status: DC

## 2017-12-30 MED ORDER — SODIUM CHLORIDE 0.9 % IV SOLN
INTRAVENOUS | Status: DC
  Administered 2017-12-30: 13:00:00 via INTRAVENOUS

## 2017-12-30 MED ORDER — HEPARIN SODIUM (PORCINE) 1000 UNIT/ML IJ SOLN
INTRAMUSCULAR | Status: DC | PRN
  Administered 2017-12-30: 10000 [IU] via INTRAVENOUS

## 2017-12-30 MED ORDER — CLOPIDOGREL BISULFATE 75 MG PO TABS
75.0000 mg | ORAL_TABLET | Freq: Every day | ORAL | Status: DC
  Administered 2017-12-31: 75 mg via ORAL
  Filled 2017-12-30: qty 1

## 2017-12-30 MED ORDER — NITROGLYCERIN IN D5W 200-5 MCG/ML-% IV SOLN: 0.0000 ug/min | INTRAVENOUS | Status: DC

## 2017-12-30 MED ORDER — METOPROLOL TARTRATE 5 MG/5ML IV SOLN: 2.5000 mg | INTRAVENOUS | Status: DC | PRN

## 2017-12-30 MED ORDER — TRAMADOL HCL 50 MG PO TABS: 50.0000 mg | ORAL_TABLET | ORAL | Status: DC | PRN

## 2017-12-30 MED ORDER — CHLORHEXIDINE GLUCONATE 4 % EX LIQD: 60.0000 mL | Freq: Once | CUTANEOUS | Status: DC

## 2017-12-30 MED ORDER — SODIUM CHLORIDE 0.9 % IV SOLN: 250.0000 mL | INTRAVENOUS | Status: DC | PRN

## 2017-12-30 MED ORDER — CHLORHEXIDINE GLUCONATE 0.12 % MT SOLN
15.0000 mL | Freq: Once | OROMUCOSAL | Status: AC
  Administered 2017-12-30: 15 mL via OROMUCOSAL
  Filled 2017-12-30: qty 15

## 2017-12-30 MED ORDER — FENTANYL CITRATE (PF) 250 MCG/5ML IJ SOLN
INTRAMUSCULAR | Status: DC | PRN
  Administered 2017-12-30: 50 ug via INTRAVENOUS
  Administered 2017-12-30: 25 ug via INTRAVENOUS
  Administered 2017-12-30: 50 ug via INTRAVENOUS

## 2017-12-30 MED ORDER — IODIXANOL 320 MG/ML IV SOLN
INTRAVENOUS | Status: DC | PRN
  Administered 2017-12-30: 100.9 mL via INTRAVENOUS

## 2017-12-30 MED ORDER — ROSUVASTATIN CALCIUM 10 MG PO TABS
10.0000 mg | ORAL_TABLET | Freq: Every day | ORAL | Status: DC
  Administered 2017-12-30: 10 mg via ORAL
  Filled 2017-12-30: qty 1

## 2017-12-30 MED ORDER — FENTANYL CITRATE (PF) 100 MCG/2ML IJ SOLN
100.0000 ug | Freq: Once | INTRAMUSCULAR | Status: AC
  Administered 2017-12-30: 50 ug via INTRAVENOUS

## 2017-12-30 MED ORDER — SODIUM CHLORIDE 0.9 % IV SOLN
1.5000 g | Freq: Two times a day (BID) | INTRAVENOUS | Status: DC
  Administered 2017-12-30 – 2017-12-31 (×2): 1.5 g via INTRAVENOUS
  Filled 2017-12-30 (×3): qty 1.5

## 2017-12-30 MED ORDER — MIDAZOLAM HCL 2 MG/2ML IJ SOLN
2.0000 mg | Freq: Once | INTRAMUSCULAR | Status: AC
  Administered 2017-12-30: 1 mg via INTRAVENOUS

## 2017-12-30 MED ORDER — PROPOFOL 500 MG/50ML IV EMUL
INTRAVENOUS | Status: DC | PRN
  Administered 2017-12-30: 20 ug/kg/min via INTRAVENOUS

## 2017-12-30 MED ORDER — LACTATED RINGERS IV SOLN
INTRAVENOUS | Status: DC | PRN
  Administered 2017-12-30: 13:00:00 via INTRAVENOUS

## 2017-12-30 MED ORDER — SODIUM CHLORIDE 0.9 % IV SOLN
INTRAVENOUS | Status: AC
  Filled 2017-12-30: qty 1.2

## 2017-12-30 MED ORDER — SODIUM CHLORIDE 0.9% FLUSH
3.0000 mL | Freq: Two times a day (BID) | INTRAVENOUS | Status: DC
  Administered 2017-12-31: 3 mL via INTRAVENOUS

## 2017-12-30 MED ORDER — FENTANYL CITRATE (PF) 100 MCG/2ML IJ SOLN
INTRAMUSCULAR | Status: AC
  Administered 2017-12-30: 50 ug via INTRAVENOUS
  Filled 2017-12-30: qty 2

## 2017-12-30 MED ORDER — ACETAMINOPHEN 650 MG RE SUPP: 650.0000 mg | Freq: Four times a day (QID) | RECTAL | Status: DC | PRN

## 2017-12-30 MED ORDER — VANCOMYCIN HCL IN DEXTROSE 1-5 GM/200ML-% IV SOLN
1000.0000 mg | Freq: Once | INTRAVENOUS | Status: AC
  Administered 2017-12-30: 1000 mg via INTRAVENOUS
  Filled 2017-12-30: qty 200

## 2017-12-30 MED ORDER — LIDOCAINE 2% (20 MG/ML) 5 ML SYRINGE
INTRAMUSCULAR | Status: DC | PRN
  Administered 2017-12-30: 50 mg via INTRAVENOUS

## 2017-12-30 MED ORDER — FENTANYL CITRATE (PF) 250 MCG/5ML IJ SOLN
INTRAMUSCULAR | Status: AC
  Filled 2017-12-30: qty 5

## 2017-12-30 MED ORDER — PHENYLEPHRINE HCL-NACL 20-0.9 MG/250ML-% IV SOLN: 0.0000 ug/min | INTRAVENOUS | Status: DC

## 2017-12-30 MED ORDER — MIDAZOLAM HCL 2 MG/2ML IJ SOLN
INTRAMUSCULAR | Status: AC
  Administered 2017-12-30: 1 mg via INTRAVENOUS
  Filled 2017-12-30: qty 2

## 2017-12-30 MED ORDER — HEPARIN SODIUM (PORCINE) 1000 UNIT/ML IJ SOLN
INTRAMUSCULAR | Status: AC
  Filled 2017-12-30: qty 1

## 2017-12-30 MED ORDER — LIDOCAINE HCL 1 % IJ SOLN
INTRAMUSCULAR | Status: DC | PRN
  Administered 2017-12-30: 20 mL

## 2017-12-30 MED ORDER — MIDAZOLAM HCL 2 MG/2ML IJ SOLN
INTRAMUSCULAR | Status: AC
  Filled 2017-12-30: qty 2

## 2017-12-30 MED ORDER — PROTAMINE SULFATE 10 MG/ML IV SOLN
INTRAVENOUS | Status: DC | PRN
  Administered 2017-12-30 (×3): 20 mg via INTRAVENOUS
  Administered 2017-12-30: 10 mg via INTRAVENOUS
  Administered 2017-12-30: 20 mg via INTRAVENOUS

## 2017-12-30 MED ORDER — PROTAMINE SULFATE 10 MG/ML IV SOLN
INTRAVENOUS | Status: AC
  Filled 2017-12-30: qty 5

## 2017-12-30 MED ORDER — LIDOCAINE HCL 1 % IJ SOLN
INTRAMUSCULAR | Status: AC
  Filled 2017-12-30: qty 20

## 2017-12-30 MED ORDER — OXYCODONE HCL 5 MG PO TABS
5.0000 mg | ORAL_TABLET | ORAL | Status: DC | PRN
  Administered 2017-12-30: 5 mg via ORAL
  Administered 2017-12-31 (×2): 10 mg via ORAL
  Filled 2017-12-30: qty 1
  Filled 2017-12-30 (×2): qty 2

## 2017-12-30 MED ORDER — OXYBUTYNIN CHLORIDE ER 10 MG PO TB24
10.0000 mg | ORAL_TABLET | Freq: Every day | ORAL | Status: DC
  Administered 2017-12-30: 10 mg via ORAL
  Filled 2017-12-30: qty 1

## 2017-12-30 SURGICAL SUPPLY — 95 items
BAG DECANTER FOR FLEXI CONT (MISCELLANEOUS) IMPLANT
BAG SNAP BAND KOVER 36X36 (MISCELLANEOUS) ×4 IMPLANT
BLADE CLIPPER SURG (BLADE) IMPLANT
BLADE OSCILLATING /SAGITTAL (BLADE) IMPLANT
BLADE STERNUM SYSTEM 6 (BLADE) IMPLANT
CABLE ADAPT CONN TEMP 6FT (ADAPTER) ×4 IMPLANT
CANNULA FEM VENOUS REMOTE 22FR (CANNULA) IMPLANT
CANNULA OPTISITE PERFUSION 16F (CANNULA) IMPLANT
CANNULA OPTISITE PERFUSION 18F (CANNULA) IMPLANT
CATH DIAG EXPO 6F VENT PIG 145 (CATHETERS) ×8 IMPLANT
CATH EXPO 5FR AL1 (CATHETERS) IMPLANT
CATH INFINITI 6F AL2 (CATHETERS) IMPLANT
CATH S G BIP PACING (SET/KITS/TRAYS/PACK) ×4 IMPLANT
CLIP VESOCCLUDE MED 24/CT (CLIP) IMPLANT
CLIP VESOCCLUDE SM WIDE 24/CT (CLIP) IMPLANT
CONT SPEC 4OZ CLIKSEAL STRL BL (MISCELLANEOUS) ×8 IMPLANT
COVER BACK TABLE 24X17X13 BIG (DRAPES) IMPLANT
COVER BACK TABLE 80X110 HD (DRAPES) ×4 IMPLANT
COVER DOME SNAP 22 D (MISCELLANEOUS) IMPLANT
COVER WAND RF STERILE (DRAPES) ×4 IMPLANT
CRADLE DONUT ADULT HEAD (MISCELLANEOUS) ×4 IMPLANT
DERMABOND ADHESIVE PROPEN (GAUZE/BANDAGES/DRESSINGS) ×2
DERMABOND ADVANCED (GAUZE/BANDAGES/DRESSINGS) ×2
DERMABOND ADVANCED .7 DNX12 (GAUZE/BANDAGES/DRESSINGS) ×2 IMPLANT
DERMABOND ADVANCED .7 DNX6 (GAUZE/BANDAGES/DRESSINGS) ×2 IMPLANT
DEVICE CLOSURE PERCLS PRGLD 6F (VASCULAR PRODUCTS) ×4 IMPLANT
DRAPE INCISE IOBAN 66X45 STRL (DRAPES) IMPLANT
DRAPE SURG IRRIG POUCH 19X23 (DRAPES) ×4 IMPLANT
DRSG TEGADERM 4X4.75 (GAUZE/BANDAGES/DRESSINGS) ×8 IMPLANT
ELECT CAUTERY BLADE 6.4 (BLADE) IMPLANT
ELECT REM PT RETURN 9FT ADLT (ELECTROSURGICAL) ×8
ELECTRODE REM PT RTRN 9FT ADLT (ELECTROSURGICAL) ×4 IMPLANT
FELT TEFLON 6X6 (MISCELLANEOUS) IMPLANT
FEMORAL VENOUS CANN RAP (CANNULA) IMPLANT
GAUZE SPONGE 4X4 12PLY STRL (GAUZE/BANDAGES/DRESSINGS) ×4 IMPLANT
GLOVE BIO SURGEON STRL SZ7.5 (GLOVE) ×4 IMPLANT
GLOVE BIO SURGEON STRL SZ8 (GLOVE) IMPLANT
GLOVE EUDERMIC 7 POWDERFREE (GLOVE) IMPLANT
GLOVE ORTHO TXT STRL SZ7.5 (GLOVE) IMPLANT
GOWN STRL REUS W/ TWL LRG LVL3 (GOWN DISPOSABLE) IMPLANT
GOWN STRL REUS W/ TWL XL LVL3 (GOWN DISPOSABLE) ×2 IMPLANT
GOWN STRL REUS W/TWL LRG LVL3 (GOWN DISPOSABLE)
GOWN STRL REUS W/TWL XL LVL3 (GOWN DISPOSABLE) ×2
GUIDEWIRE SAFE TJ AMPLATZ EXST (WIRE) ×8 IMPLANT
GUIDEWIRE STRAIGHT .035 260CM (WIRE) ×4 IMPLANT
INSERT FOGARTY SM (MISCELLANEOUS) IMPLANT
KIT BASIN OR (CUSTOM PROCEDURE TRAY) ×4 IMPLANT
KIT DILATOR VASC 18G NDL (KITS) IMPLANT
KIT HEART LEFT (KITS) ×4 IMPLANT
KIT SUCTION CATH 14FR (SUCTIONS) IMPLANT
KIT TURNOVER KIT B (KITS) ×4 IMPLANT
LOOP VESSEL MAXI BLUE (MISCELLANEOUS) IMPLANT
LOOP VESSEL MINI RED (MISCELLANEOUS) IMPLANT
NEEDLE 22X1 1/2 (OR ONLY) (NEEDLE) ×4 IMPLANT
NEEDLE PERC 18GX7CM (NEEDLE) ×4 IMPLANT
NS IRRIG 1000ML POUR BTL (IV SOLUTION) ×4 IMPLANT
PACK ENDOVASCULAR (PACKS) ×4 IMPLANT
PAD ARMBOARD 7.5X6 YLW CONV (MISCELLANEOUS) ×8 IMPLANT
PAD ELECT DEFIB RADIOL ZOLL (MISCELLANEOUS) ×4 IMPLANT
PENCIL BUTTON HOLSTER BLD 10FT (ELECTRODE) ×4 IMPLANT
PERCLOSE PROGLIDE 6F (VASCULAR PRODUCTS) ×8
SET MICROPUNCTURE 5F STIFF (MISCELLANEOUS) ×4 IMPLANT
SHEATH BRITE TIP 6FR 35CM (SHEATH) ×4 IMPLANT
SHEATH PINNACLE 6F 10CM (SHEATH) ×4 IMPLANT
SHEATH PINNACLE 8F 10CM (SHEATH) ×4 IMPLANT
SLEEVE REPOSITIONING LENGTH 30 (MISCELLANEOUS) ×4 IMPLANT
SPONGE LAP 4X18 RFD (DISPOSABLE) IMPLANT
STOPCOCK MORSE 400PSI 3WAY (MISCELLANEOUS) ×8 IMPLANT
SUT ETHIBOND X763 2 0 SH 1 (SUTURE) IMPLANT
SUT GORETEX CV 4 TH 22 36 (SUTURE) IMPLANT
SUT GORETEX CV4 TH-18 (SUTURE) IMPLANT
SUT MNCRL AB 3-0 PS2 18 (SUTURE) IMPLANT
SUT PROLENE 2 0 MH 48 (SUTURE) ×8 IMPLANT
SUT PROLENE 5 0 C 1 36 (SUTURE) ×4 IMPLANT
SUT PROLENE 6 0 C 1 30 (SUTURE) IMPLANT
SUT SILK  1 MH (SUTURE) ×2
SUT SILK 1 MH (SUTURE) ×2 IMPLANT
SUT VIC AB 2-0 CT1 27 (SUTURE)
SUT VIC AB 2-0 CT1 TAPERPNT 27 (SUTURE) IMPLANT
SUT VIC AB 2-0 CTX 36 (SUTURE) IMPLANT
SUT VIC AB 3-0 SH 8-18 (SUTURE) IMPLANT
SYR 50ML LL SCALE MARK (SYRINGE) ×3 IMPLANT
SYR BULB IRRIGATION 50ML (SYRINGE) IMPLANT
SYR CONTROL 10ML LL (SYRINGE) IMPLANT
SYRINGE 60CC LL (MISCELLANEOUS) ×4 IMPLANT
TAPE CLOTH SURG 4X10 WHT LF (GAUZE/BANDAGES/DRESSINGS) ×4 IMPLANT
TOWEL GREEN STERILE (TOWEL DISPOSABLE) ×8 IMPLANT
TRANSDUCER DISP STR W/STOPCOCK (MISCELLANEOUS) ×4 IMPLANT
TRANSDUCER W/STOPCOCK (MISCELLANEOUS) ×8 IMPLANT
TRAY FOLEY SLVR 16FR TEMP STAT (SET/KITS/TRAYS/PACK) IMPLANT
VALVE HEART TRANSCATH SZ3 23MM (Prosthesis & Implant Heart) ×4 IMPLANT
WIRE .035 3MM-J 145CM (WIRE) ×4 IMPLANT
WIRE AMPLATZ SS-J .035X180CM (WIRE) ×4 IMPLANT
WIRE EMERALD 3MM-J .035X150CM (WIRE) ×4 IMPLANT
WIRE EMERALD 3MM-J .035X260CM (WIRE) ×4 IMPLANT

## 2017-12-30 NOTE — Transfer of Care (Signed)
Immediate Anesthesia Transfer of Care Note  Patient: Alexandra [name]  Procedure(s) Performed: TRANSCATHETER AORTIC VALVE REPLACEMENT, TRANSFEMORAL (N/A Chest) TRANSESOPHAGEAL ECHOCARDIOGRAM (TEE) (N/A )  Patient Location: Cath Lab  Anesthesia Type:MAC  Level of Consciousness: awake, alert  and oriented  Airway & Oxygen Therapy: Patient Spontanous Breathing, Patient connected to nasal cannula oxygen and Patient connected to face mask oxygen  Post-op Assessment: Report given to RN and Post -op Vital signs reviewed and stable  Post vital signs: Reviewed and stable  Last Vitals:  Vitals Value Taken Time  BP 159/61 12/30/2017  3:31 PM  Temp    Pulse 52 12/30/2017  3:33 PM  Resp 21 12/30/2017  3:33 PM  SpO2 96 % 12/30/2017  3:33 PM  Vitals shown include unvalidated device data.  Last Pain:  Vitals:   12/30/17 1026  TempSrc:   PainSc: 0-No pain      Patients Stated Pain Goal: 6 (83/41/96 2229)  Complications: No apparent anesthesia complications

## 2017-12-30 NOTE — Anesthesia Procedure Notes (Signed)
Arterial Line Insertion Performed by: Beckner, Alisha B, CRNA, CRNA  Patient location: Pre-op. Preanesthetic checklist: patient identified, IV checked, site marked, risks and benefits discussed, surgical consent, monitors and equipment checked, pre-op evaluation, timeout performed and anesthesia consent Lidocaine 1% used for infiltration Right, radial was placed Catheter size: 20 G Hand hygiene performed , maximum sterile barriers used  and Seldinger technique used Allen's test indicative of satisfactory collateral circulation Attempts: 1 Procedure performed without using ultrasound guided technique. Following insertion, dressing applied and Biopatch. Post procedure assessment: normal  Patient tolerated the procedure well with no immediate complications.    

## 2017-12-30 NOTE — Progress Notes (Signed)
Patient arrived from cath lab to roomm 4e05 patient placed on monitor and ccmd made aware patient with B groins. Level 0. The right groin with dressing stain marked at top. Unchanged from report. Patient with call bell with in reach will monitor patient. Tamiko Leopard, Bettina Gavia RN

## 2017-12-30 NOTE — Anesthesia Procedure Notes (Signed)
Procedure Name: MAC Date/Time: 12/30/2017 1:26 PM Performed by: Mariea Clonts, CRNA Pre-anesthesia Checklist: Patient identified, Emergency Drugs available, Suction available, Patient being monitored and Timeout performed Patient Re-evaluated:Patient Re-evaluated prior to induction Oxygen Delivery Method: Nasal cannula and Simple face mask

## 2017-12-30 NOTE — Progress Notes (Signed)
  [facility] VALVE TEAM  Patient doing well s/p TAVR. She is hemodynamically stable. Groin sites stable. ECG with old LBBB and sinus brady but no high grade block. Arterial line discontinued and transferred  to 4E. Plan for early ambulation after bedrest completed and hopeful discharge over the next 24-48 hours.   Angelena Form PA-C  [facility]  Pager [phone]

## 2017-12-30 NOTE — Progress Notes (Signed)
I spoke with Erasmo Downer re: consult for malpositioned IJ, told her to check with the MD re: this matter.

## 2017-12-30 NOTE — Progress Notes (Signed)
Site area: RFA/RFV Site Prior to Removal:  Level 0 Pressure Applied For: 20 min Manual:   yes Patient Status During Pull:  stable Post Pull Site:  Level 0 Post Pull Instructions Given:  yes Post Pull Pulses Present: palpable Dressing Applied:  clear Bedrest begins @ 1545 till 1945 Comments: removed by A. Alroy Dust

## 2017-12-30 NOTE — Interval H&P Note (Signed)
History and Physical Interval Note:  12/30/2017 1:05 PM  APREL EGELHOFF  has presented today for surgery, with the diagnosis of Severe Aortic Stenosis  The various methods of treatment have been discussed with the patient and family. After consideration of risks, benefits and other options for treatment, the patient has consented to  Procedure(s): TRANSCATHETER AORTIC VALVE REPLACEMENT, TRANSFEMORAL (N/A) POSSIBLE TRANSCATHETER AORTIC VALVE REPLACEMENT, TRANSAPICAL (N/A) TRANSESOPHAGEAL ECHOCARDIOGRAM (TEE) (N/A) as a surgical intervention .  The patient's history has been reviewed, patient examined, no change in status, stable for surgery.  I have reviewed the patient's chart and labs.  Questions were answered to the patient's satisfaction.     Shortness of breath: Yes.   If yes: with what activity?: ambulation Worse than previously noted?: Yes.    New edema, PND, orthopnea: No.  Recent decrease in activity i.e. more difficulty walking to mailbox, climbing stairs, etc: Yes.    Changes in sleeping i.e. need to utilize to sleep on more pillows, sitting up, etc: No.  Changes since last seen in pre-op visit: Yes.   BNP is now 1300 consistent with acute on chronic diastolic heart failure.  Alexandra [name]

## 2017-12-30 NOTE — Anesthesia Procedure Notes (Signed)
Central Venous Catheter Insertion Performed by: Annye Asa, MD, anesthesiologist Start/End10/10/2017 12:36 PM, 12/30/2017 12:48 PM Patient location: Pre-op. Preanesthetic checklist: patient identified, IV checked, risks and benefits discussed, surgical consent, monitors and equipment checked, pre-op evaluation, timeout performed and anesthesia consent Position: supine Lidocaine 1% used for infiltration and patient sedated Hand hygiene performed , maximum sterile barriers used  and Seldinger technique used Catheter size: 8 Fr Central line was placed.Double lumen Procedure performed using ultrasound guided technique. Ultrasound Notes:anatomy identified, needle tip was noted to be adjacent to the nerve/plexus identified, no ultrasound evidence of intravascular and/or intraneural injection and image(s) printed for medical record Attempts: 1 Following insertion, line sutured, dressing applied and Biopatch. Post procedure assessment: blood return through all ports, free fluid flow and no air  Patient tolerated the procedure well with no immediate complications. Additional procedure comments: CVP: Timeout, sterile prep, drape, FBP R neck.  Supine position.  1% lido local, finder and trocar RIJ 1st pass with US guidance.  2 lumen placed over J wire. Biopatch and sterile dressing on.  Patient tolerated well.  VSS.  Jenita Seashore, MD.

## 2017-12-30 NOTE — Op Note (Signed)
[facility]   MULTIDISCIPLINARY HEART VALVE TEAM   TAVR OPERATIVE NOTE   Date of Procedure:  12/30/2017  Preoperative Diagnosis: Severe Aortic Stenosis   Postoperative Diagnosis: Same   Procedure:    Transcatheter Aortic Valve Replacement - Percutaneous Left Transfemoral Approach  Edwards Sapien 3 THV (size 23 mm, model # 9600TFX, serial # [id number])   Co-Surgeons:  Gaye Pollack, MD and Lauree Chandler, MD    Anesthesiologist:  Annye Asa, MD  Echocardiographer:  Jenkins Rouge, MD  Pre-operative Echo Findings:  Severe aortic stenosis  Normal left ventricular systolic function  Post-operative Echo Findings:  Mild paravalvular leak  Normal left ventricular systolic function   BRIEF CLINICAL NOTE AND INDICATIONS FOR SURGERY  This 67 year old woman has stage D, severe, symptomatic aortic stenosis with New York Heart Association class II symptoms of exertional fatigue and shortness of breath consistent with chronic diastolic congestive heart failure. I have personally reviewed her echocardiogram, cardiac catheterization, and CTA studies. Echocardiogram shows a trileaflet aortic valve with severe calcification of the valve leaflets with a mean gradient of 32 mmHg and a dimensionless index of 0.28 consistent with severe aortic stenosis. There is mild to moderate aortic insufficiency. Cardiac catheterization shows nonobstructive coronary disease. I agree that aortic valve replacement is indicated in this patient. She is a low risk surgical patient but would rather undergo transcatheter aortic valve replacement then open surgical replacement. I think she is a reasonable candidate for that. Her gated cardiac CTA shows anatomy that is suitable for transcatheter aortic valve replacement without any complicating features. Her abdominal and pelvic CTA shows significant calcific atherosclerosis of the aorta and iliac vessels but I think she probably has adequate  left iliac and femoral vessels to allow transfemoral insertion. She has significant calcific atherosclerosis of the aortic arch particularly around the takeoff of the great vessels so if alternative access is required I think we would need to perform transapical insertion. I discussed this with her.  The patient was counseled at length regarding treatment alternatives for management of severe symptomatic aortic stenosis. The risks and benefits of surgical intervention has been discussed in detail. Long-term prognosis with medical therapy was discussed. Alternative approaches such as conventional surgical aortic valve replacement, transcatheter aortic valve replacement, and palliative medical therapy were compared and contrasted at length. This discussion was placed in the context of the patient's own specific clinical presentation and past medical history. All of her questions have been addressed.   Following the decision to proceed with transcatheter aortic valve replacement, a discussion was held regarding what types of management strategies would be attempted intraoperatively in the event of life-threatening complications, including whether or not the patient would be considered a candidate for the use of cardiopulmonary bypass and/or conversion to open sternotomy for attempted surgical intervention. The patient has been advised of a variety of complications that might develop including but not limited to risks of death, stroke, paravalvular leak, aortic dissection or other major vascular complications, aortic annulus rupture, device embolization, cardiac rupture or perforation, mitral regurgitation, acute myocardial infarction, arrhythmia, heart block or bradycardia requiring permanent pacemaker placement, congestive heart failure, respiratory failure, renal failure, pneumonia, infection, other late complications related to structural valve deterioration or migration, or other complications that might  ultimately cause a temporary or permanent loss of functional independence or other long term morbidity. The patient provides full informed consent for the procedure as described and all questions were answered.    DETAILS OF THE OPERATIVE PROCEDURE  PREPARATION:    The patient is brought to the operating room on the above mentioned date and central monitoring was established by the anesthesia team including placement of a central venous line and radial arterial line. The patient is placed in the supine position on the operating table.  Intravenous antibiotics are administered. The patient is monitored closely throughout the procedure under conscious sedation. Baseline transthoracic echocardiogram was performed. The patient's chest, abdomen, both groins, and both lower extremities are prepared and draped in a sterile manner. A time out procedure is performed.   PERIPHERAL ACCESS:    Using the modified Seldinger technique, femoral arterial and venous access was obtained with placement of 6 Fr sheaths on the right side.  A pigtail diagnostic catheter was passed through the right arterial sheath under fluoroscopic guidance into the aortic root.  A temporary transvenous pacemaker catheter was passed through the right femoral venous sheath under fluoroscopic guidance into the right ventricle.  The pacemaker was tested to ensure stable lead placement and pacemaker capture. Aortic root angiography was performed in order to determine the optimal angiographic angle for valve deployment.   TRANSFEMORAL ACCESS:   Percutaneous transfemoral access and sheath placement was performed using ultrasound guidance.  The left common femoral artery was cannulated using a micropuncture needle and appropriate location was verified using hand injection angiogram.  A pair of Abbott Perclose percutaneous closure devices were placed and a 6 French sheath replaced into the femoral artery.  The patient was heparinized  systemically and ACT verified > 250 seconds.    A 14 Fr transfemoral E-sheath was introduced into the left femoral artery after progressively dilating over an Amplatz superstiff wire. An AL-1 catheter was used to direct a straight-tip exchange length wire across the native aortic valve into the left ventricle. This was exchanged out for a pigtail catheter and position was confirmed in the LV apex. Simultaneous LV and Ao pressures were recorded.  The pigtail catheter was exchanged for an Amplatz Extra-stiff wire in the LV apex.   BALLOON AORTIC VALVULOPLASTY:   Not performed  TRANSCATHETER HEART VALVE DEPLOYMENT:   An Edwards Sapien 3 transcatheter heart valve (size 23 mm, model #[id number], serial #[id number]) was prepared and crimped per manufacturer's guidelines, and the proper orientation of the valve is confirmed on the [employer] delivery system. The valve was advanced through the introducer sheath using normal technique until in an appropriate position in the abdominal aorta beyond the sheath tip. The balloon was then retracted and using the fine-tuning wheel was centered on the valve. The valve was then advanced across the aortic arch using appropriate flexion of the catheter. The valve was carefully positioned across the aortic valve annulus. The Commander catheter was retracted using normal technique. Once final position of the valve has been confirmed by angiographic assessment, the valve is deployed while temporarily holding ventilation and during rapid ventricular pacing to maintain systolic blood pressure < 50 mmHg and pulse pressure < 10 mmHg. The balloon inflation is held for >3 seconds after reaching full deployment volume. Once the balloon has fully deflated the balloon is retracted into the ascending aorta and valve function is assessed using echocardiography. There is felt to be mild paravalvular leak and no central aortic insufficiency.  The patient's hemodynamic recovery following  valve deployment is good.  The deployment balloon and guidewire are both removed.    PROCEDURE COMPLETION:   The sheath was removed and femoral artery closure performed using the previously placed Perclose devices.  Protamine  was administered once femoral arterial repair was complete. The temporary pacemaker, pigtail catheters and femoral sheaths were removed with manual pressure used for hemostasis.   The patient tolerated the procedure well and is transported to the surgical intensive care in stable condition. There were no immediate intraoperative complications. All sponge instrument and needle counts are verified correct at completion of the operation.   No blood products were administered during the operation.  The patient received a total of 100.9 mL of intravenous contrast during the procedure.   Gaye Pollack, MD 12/30/2017 3:24 PM

## 2017-12-30 NOTE — CV Procedure (Signed)
[facility]  TAVR OPERATIVE NOTE   Date of Procedure:  12/30/2017  Preoperative Diagnosis: Severe Aortic Stenosis   Postoperative Diagnosis: Same   Procedure:    Transcatheter Aortic Valve Replacement - Transfemoral Approach  Edwards Sapien 3 THV (size 23 mm, model # [id number], serial # [id number])   Co-Surgeons:  Lauree Chandler, MD and Gaye Pollack, MD   Anesthesiologist:  Annye Asa  Echocardiographer:  Johnsie Cancel  Pre-operative Echo Findings:  Severe aortic stenosis  Normal left ventricular systolic function  Post-operative Echo Findings:  Mild paravalvular leak  Normal left ventricular systolic function  BRIEF CLINICAL NOTE AND INDICATIONS FOR SURGERY  67 yo female with history of HTN, HLD, COPD, chronic diastolic CHF, pulmonary HTN, tobacco abuse, CAD and severe aortic stenosis who is here today for TAVR. She is followed by Dr. Ellyn Hack and most recently has been followed by Dr. Aundra Dubin due to her pulmonary HTN. Echo in August 2018 showed normal LV systolic function with moderate aortic stenosis. Right and left heart cath in September 2018 with moderately severe distal LAD stenosis, elevated wedge pressure and severe mixed pulmonary venous and pulmonary arterial HTN. Dr. Ellyn Hack elected to treat this medically. Volume overload managed with diuretics. Echo August 2019 with normal LV function but her aortic stenosis appeared to have progressed to the severe range. The mean gradient was 32 mmHg, peak gradient 62 mmHg, AVA 0.78 cm2. DVI 0.28. The valve was calcified and the leaflet motion was restricted. There was mild to moderate AI. Most recent cardiac cath 11/20/17 with stable mild non-obstructive CAD.   During the course of the patient's preoperative work up they have been evaluated comprehensively by a multidisciplinary team of specialists coordinated through the [facility] in the [facility].  They  have been demonstrated to suffer from symptomatic severe aortic stenosis as noted above. The patient has been counseled extensively as to the relative risks and benefits of all options for the treatment of severe aortic stenosis including long term medical therapy, conventional surgery for aortic valve replacement, and transcatheter aortic valve replacement.  The patient has been independently evaluated by CT surgery Dr. Cyndia Bent and she is felt to be at high risk for conventional surgical aortic valve replacement. Both surgeons indicated the patient would be a poor candidate for conventional surgery. Based upon review of all of the patient's preoperative diagnostic tests they are felt to be candidate for transcatheter aortic valve replacement using the transfemoral approach as an alternative to high risk conventional surgery.    Following the decision to proceed with transcatheter aortic valve replacement, a discussion has been held regarding what types of management strategies would be attempted intraoperatively in the event of life-threatening complications, including whether or not the patient would be considered a candidate for the use of cardiopulmonary bypass and/or conversion to open sternotomy for attempted surgical intervention.  The patient has been advised of a variety of complications that might develop peculiar to this approach including but not limited to risks of death, stroke, paravalvular leak, aortic dissection or other major vascular complications, aortic annulus rupture, device embolization, cardiac rupture or perforation, acute myocardial infarction, arrhythmia, heart block or bradycardia requiring permanent pacemaker placement, congestive heart failure, respiratory failure, renal failure, pneumonia, infection, other late complications related to structural valve deterioration or migration, or other complications that might ultimately cause a temporary or permanent loss of functional  independence or other long term morbidity.  The patient provides full informed consent  for the procedure as described and all questions were answered preoperatively.    DETAILS OF THE OPERATIVE PROCEDURE  PREPARATION:   The patient is brought to the operating room on the above mentioned date and central monitoring was established by the anesthesia team including placement of a radial arterial line. The patient is placed in the supine position on the operating table.  Intravenous antibiotics are administered. Conscious sedation is used.   Baseline transthoracic echocardiogram was performed. The patient's chest, abdomen, both groins, and both lower extremities are prepared and draped in a sterile manner. A time out procedure is performed.   PERIPHERAL ACCESS:   Using the modified Seldinger technique, femoral arterial and venous access were obtained with placement of 6 Fr sheaths on the right side using u/s guidance.  A pigtail diagnostic catheter was passed through the femoral arterial sheath under fluoroscopic guidance into the aortic root.  A temporary transvenous pacemaker catheter was passed through the femoral venous sheath under fluoroscopic guidance into the right ventricle.  The pacemaker was tested to ensure stable lead placement and pacemaker capture. Aortic root angiography was performed in order to determine the optimal angiographic angle for valve deployment.  TRANSFEMORAL ACCESS:  A micropuncture kit was used to gain access to the left femoral artery using u/s guidance. Pre-closure with double ProGlide closure devices. The patient was heparinized systemically and ACT verified > 250 seconds.    A 14 Fr transfemoral E-sheath was introduced into the left femoral artery after progressively dilating over an Amplatz superstiff wire. An AL-1 catheter was used to direct a straight-tip exchange length wire across the native aortic valve into the left ventricle. This was exchanged out for a  pigtail catheter and position was confirmed in the LV apex. Simultaneous LV and Ao pressures were recorded.  The pigtail catheter was then exchanged for an Amplatz Extra-stiff wire in the LV apex.   TRANSCATHETER HEART VALVE DEPLOYMENT:  An Edwards Sapien 3 THV (size 23 mm) was prepared and crimped per manufacturer's guidelines, and the proper orientation of the valve is confirmed on the [employer] delivery system. The valve was advanced through the introducer sheath using normal technique until in an appropriate position in the abdominal aorta beyond the sheath tip. The balloon was then retracted and using the fine-tuning wheel was centered on the valve. The valve was then advanced across the aortic arch using appropriate flexion of the catheter. The valve was carefully positioned across the aortic valve annulus. The Commander catheter was retracted using normal technique. Once final position of the valve has been confirmed by angiographic assessment, the valve is deployed while temporarily holding ventilation and during rapid ventricular pacing to maintain systolic blood pressure < 50 mmHg and pulse pressure < 10 mmHg. The balloon inflation is held for >3 seconds after reaching full deployment volume. Once the balloon has fully deflated the balloon is retracted into the ascending aorta and valve function is assessed using TEE. There is felt to be mild paravalvular leak and no central aortic insufficiency.  The patient's hemodynamic recovery following valve deployment is good. We added 1 cc to the balloon and during another pacing run, we post-dilated the valve. The deployment balloon and guidewire are both removed. Echo demostrated acceptable post-procedural gradients, stable mitral valve function, and mild AI.   PROCEDURE COMPLETION:  The sheath was then removed and closure devices were completed. Angiography of the iliac system confirmed no iliac vessel perforation. Protamine was administered once  femoral arterial repair was complete.  The temporary pacemaker, pigtail catheters and femoral sheaths were removed with manual pressure used for hemostasis.   The patient tolerated the procedure well and is transported to the surgical intensive care in stable condition. There were no immediate intraoperative complications. All sponge instrument and needle counts are verified correct at completion of the operation.   No blood products were administered during the operation.  The patient received a total of 100.9 mL of intravenous contrast during the procedure.  Lauree Chandler MD 12/30/2017 3:15 PM

## 2017-12-30 NOTE — Progress Notes (Signed)
@  1937 Re-paged Dr. Servando Snare regarding Radiology's report of malpositioned IJ. Page returned and order received to D/C line.  @2100  R IJ D/C'd per policy. Pt tolerated well. Pt instructed to remain flat in bed for 1 hour and confirmed understanding. Will assess and monitor closely. Bedrest up at 2205.

## 2017-12-30 NOTE — Progress Notes (Signed)
Received report on malposition IJ from Radiology group. [facility] PAC made aware and directed this RN to call anesthesia and surgeon.  PACU/ Anesthesiology made aware and stated that the Surgeon needed to be made aware. Dr. Madolyn Frieze on call for TCTS today and he was paged through answering service. Will await call back. IV fluids were placed to peripheral site at this time. Will monitor patient. Elexis Pollak, Bettina Gavia RN

## 2017-12-31 ENCOUNTER — Other Ambulatory Visit: Payer: Self-pay | Admitting: Physician Assistant

## 2017-12-31 ENCOUNTER — Inpatient Hospital Stay (HOSPITAL_COMMUNITY): Payer: Medicare HMO

## 2017-12-31 ENCOUNTER — Encounter (HOSPITAL_COMMUNITY): Payer: Self-pay | Admitting: Cardiovascular Disease

## 2017-12-31 ENCOUNTER — Encounter: Payer: Self-pay | Admitting: Thoracic Surgery (Cardiothoracic Vascular Surgery)

## 2017-12-31 DIAGNOSIS — I350 Nonrheumatic aortic (valve) stenosis: Secondary | ICD-10-CM

## 2017-12-31 DIAGNOSIS — I361 Nonrheumatic tricuspid (valve) insufficiency: Secondary | ICD-10-CM

## 2017-12-31 DIAGNOSIS — Z006 Encounter for examination for normal comparison and control in clinical research program: Secondary | ICD-10-CM

## 2017-12-31 LAB — BASIC METABOLIC PANEL
Anion gap: 7 (ref 5–15)
BUN: 10 mg/dL (ref 8–23)
CHLORIDE: 106 mmol/L (ref 98–111)
CO2: 24 mmol/L (ref 22–32)
CREATININE: 0.93 mg/dL (ref 0.44–1.00)
Calcium: 8.4 mg/dL — ABNORMAL LOW (ref 8.9–10.3)
GFR calc Af Amer: >60 mL/min (ref >60)
GFR calc non Af Amer: >60 mL/min (ref >60)
GLUCOSE: 92 mg/dL (ref 70–99)
POTASSIUM: 3.6 mmol/L (ref 3.5–5.1)
SODIUM: 137 mmol/L (ref 135–145)

## 2017-12-31 LAB — ECHOCARDIOGRAM LIMITED
Height: 65 in
Weight: 2419.77 oz

## 2017-12-31 LAB — MAGNESIUM: Magnesium: 1.9 mg/dL (ref 1.7–2.4)

## 2017-12-31 LAB — CBC
HEMATOCRIT: 33.8 % — AB (ref 36.0–46.0)
HEMOGLOBIN: 10.4 g/dL — AB (ref 12.0–15.0)
MCH: 28.7 pg (ref 26.0–34.0)
MCHC: 30.8 g/dL (ref 30.0–36.0)
MCV: 93.4 fL (ref 80.0–100.0)
Platelets: 197 10*3/uL (ref 150–400)
RBC: 3.62 MIL/uL — AB (ref 3.87–5.11)
RDW: 13.1 % (ref 11.5–15.5)
WBC: 10.1 10*3/uL (ref 4.0–10.5)
nRBC: 0 % (ref 0.0–0.2)

## 2017-12-31 MED ORDER — HYDROCHLOROTHIAZIDE 10 MG/ML ORAL SUSPENSION
6.2500 mg | Freq: Every day | ORAL | Status: DC
  Filled 2017-12-31: qty 1.25

## 2017-12-31 MED ORDER — LOSARTAN POTASSIUM 50 MG PO TABS
100.0000 mg | ORAL_TABLET | Freq: Every day | ORAL | Status: DC
  Administered 2017-12-31: 100 mg via ORAL
  Filled 2017-12-31: qty 2

## 2017-12-31 MED ORDER — BISOPROLOL-HYDROCHLOROTHIAZIDE 10-6.25 MG PO TABS: 1.0000 | ORAL_TABLET | Freq: Every day | ORAL | Status: DC

## 2017-12-31 MED ORDER — FUROSEMIDE 40 MG PO TABS
40.0000 mg | ORAL_TABLET | Freq: Every day | ORAL | Status: DC
  Administered 2017-12-31: 40 mg via ORAL
  Filled 2017-12-31: qty 1

## 2017-12-31 MED ORDER — BISOPROLOL FUMARATE 5 MG PO TABS
10.0000 mg | ORAL_TABLET | Freq: Every day | ORAL | Status: DC
  Filled 2017-12-31: qty 2

## 2017-12-31 MED ORDER — POTASSIUM CHLORIDE CRYS ER 20 MEQ PO TBCR
20.0000 meq | EXTENDED_RELEASE_TABLET | Freq: Every day | ORAL | Status: DC
  Administered 2017-12-31: 20 meq via ORAL
  Filled 2017-12-31: qty 1

## 2017-12-31 MED ORDER — ASPIRIN 81 MG PO TABS: 81.0000 mg | ORAL_TABLET | Freq: Every day | ORAL | Status: DC

## 2017-12-31 MED ORDER — CLOPIDOGREL BISULFATE 75 MG PO TABS: 75.0000 mg | ORAL_TABLET | Freq: Every day | ORAL | 1 refills | Status: DC

## 2017-12-31 MED FILL — Heparin Sodium (Porcine) Inj 1000 Unit/ML: INTRAMUSCULAR | Qty: 30 | Status: AC

## 2017-12-31 MED FILL — Potassium Chloride Inj 2 mEq/ML: INTRAVENOUS | Qty: 40 | Status: AC

## 2017-12-31 MED FILL — Magnesium Sulfate Inj 50%: INTRAMUSCULAR | Qty: 10 | Status: AC

## 2017-12-31 NOTE — Progress Notes (Addendum)
CARDIAC REHAB PHASE I   Offered to walk with pt, pt declining stating she has been walking in the halls this am. Pt given heart healthy diet. Reviewed restrictions and exercise guidelines. Pt educated on showering and monitoring incisions. Pt educated on importance of quitting smoking, tip sheet given. Pt unsure if she will be able to quit. Will refer to CRP II [place]. Pt anxiously awaiting discharge.   [id number] Rufina Falco, RN BSN 12/31/2017 10:57 AM

## 2017-12-31 NOTE — Progress Notes (Signed)
  Echocardiogram 2D Echocardiogram has been performed.  Bobbye Charleston 12/31/2017, 9:48 AM

## 2017-12-31 NOTE — Anesthesia Postprocedure Evaluation (Signed)
Anesthesia Post Note  Patient: Alexandra [name]  Procedure(s) Performed: TRANSCATHETER AORTIC VALVE REPLACEMENT, TRANSFEMORAL (N/A Chest) TRANSESOPHAGEAL ECHOCARDIOGRAM (TEE) (N/A )     Anesthesia Type: MAC Pain management: pain level controlled Vital Signs Assessment: post-procedure vital signs reviewed and stable Respiratory status: spontaneous breathing, nonlabored ventilation and respiratory function stable Cardiovascular status: blood pressure returned to baseline and stable Postop Assessment: no apparent nausea or vomiting, adequate PO intake and able to ambulate Anesthetic complications: no Comments: Pt discharged just prior to post op visit, no reported complications    Last Vitals:  Vitals:   12/31/17 0759 12/31/17 1329  BP: (!) 145/69 (!) 160/82  Pulse: (!) 58 71  Resp: 18 20  Temp: (!) 36.3 C 36.7 C  SpO2: 98% 99%    Last Pain:  Vitals:   12/31/17 1329  TempSrc: Oral  PainSc:                  Brynlyn Dade,E. Noe Goyer

## 2017-12-31 NOTE — Progress Notes (Signed)
D/c instructions given to pt. Medications and wound care reviewed. IV removed, clean and intact. Telemetry removed. Husband to escort pt home.  Clyde Canterbury, RN

## 2017-12-31 NOTE — Discharge Summary (Addendum)
[place] VALVE TEAM  Discharge Summary    Patient ID: Alexandra [name] MRN: [id number]; DOB: April 23, 1950  Admit date: 12/30/2017 Discharge date: 12/31/2017  Primary Care Provider: [facility]  Primary Cardiologist:  Dr. Ellyn Hack Dr. Angelena Form & Dr. Cyndia Bent (TAVR)  Discharge Diagnoses    Principal Problem:   S/P TAVR (transcatheter aortic valve replacement) Active Problems:   Essential hypertension   Chronic pain syndrome   Osteopenia   COPD (chronic obstructive pulmonary disease) (HCC)   Smoker   Coronary artery disease, non-occlusive   Chronic fatigue   GERD (gastroesophageal reflux disease)   Hypertension   Hyperlipidemia   Severe aortic stenosis   Pulmonary nodules   Acute on chronic diastolic heart failure (HCC)   Allergies Allergies  Allergen Reactions  . Codeine Nausea Only  . Symproic [Naldemedine] Nausea And Vomiting    Diagnostic Studies/Procedures    TAVR OPERATIVE NOTE   Date of Procedure:12/30/2017  Preoperative Diagnosis:Severe Aortic Stenosis   Postoperative Diagnosis:Same   Procedure:   Transcatheter Aortic Valve Replacement - Transfemoral Approach Edwards Sapien 3 THV (size 45mm, model # [id number], serial #[id number])  Co-Surgeons:Christopher Angelena Form, MD and Gaye Pollack, MD   Anesthesiologist:Carswell Glennon Mac  Echocardiographer:Nishan  Pre-operative Echo Findings: ? Severe aortic stenosis ? Normalleft ventricular systolic function  Post-operative Echo Findings: ? Mildparavalvular leak ? Normalleft ventricular systolic function  ____________________  Echo 12/31/17: pending formal read    History of Present Illness     Alexandra [name] is a 67 y.o. female with a history of HTN, HLD,COPD, chronic diastolic CHF, pulmonary HTN, ongoing tobacco  abuse, CADandsevereaortic stenosis who presented to [facility] on 12/30/17 for planned TAVR.   An echo in 10/2016 showed a mean aortic valve gradient of 31 mmHg with a dimensionless index of 0.24. She was seen in our office at that time by Dr. Darcey Nora who was seeing her for a right lower lobe nodular density which resolved on subsequent CT scan. She was not having any clear symptoms of congestive heart failure and was therefore referred back to cardiology for follow-up. Over the past several months she has developed exertional fatigue and shortness of breath as well as two-pillow orthopnea. She has been followed by Dr. Aundra Dubin and was treated for volume overload with Lasix. She had a follow-up echocardiogram on 10/31/2017 which showed a mean valve gradient of 32 mmHg with a peak of 62 mmHg. The dimensionless index was 0.28. There was mild to moderate aortic insufficiency with a pressure half-time of 515 ms. Left ventricular ejection fraction was 55 to 60% with grade 1 diastolic dysfunction. There is moderate left ventricular hypertrophy. She subsequently underwent right and left heart cath by Dr. Aundra Dubin on 11/20/2017. This showed nonobstructive coronary disease. There was mild pulmonary hypertension with a pulmonary vascular resistance of only 3.78 Wood units. Right heart pressures were low.   She was evaluated by the multidisciplinary valve team who felt she had severe, symptomatic aortic stenosis with NYHA class II symptoms and a suitable candidate for TAVR, which was set up for 12/30/2017.  Hospital Course     Consultants: none  Severe AS:s/p successful TAVR with a 23 mm Edwards Sapien 3 THV via the TF approach on 12/30/17. Post operative echo completed but pending formal read; although gradients appear to be moderately elevated (mean/peak 25 / 43 mm Hg respectively). Groin sites are stable. ECG with sinus and old LBBB and no high grade heart block. Continue asa  and plavix. Plan for discharge home today with  close outpatient follow up.   HTN: BP mildly elevated. She has been resumed on home anti hypertensive with the exception of home diltiazem given sinus bradycardia.  Acute on chronic diastolic CHF: as evidenced by an elevated BNP ~1300 and progressive symptoms of shortness of breath on admission. This has been treated with  TAVR. I have also resumed her home lasix.   COPD: stable. Counseled on tobacco cessation, but not ready to quit. Only smokes "2 cigarettes per day"  HLD: continue statin   Pulmonary nodules: seen on pre op TAVR scans. She has been followed for a right pulmonary nodule in the past that apparently resolved on its own. Radiology has recommended follow up with a non contrast CT at 12 months if high risk (she is a long time, heavy smoker.) I will have this arranged in the outpatient setting.  _____________  Discharge Vitals Blood pressure (!) 145/69, pulse (!) 58, temperature (!) 97.3 F (36.3 C), temperature source Oral, resp. rate 18, height 5\' 5"  (1.651 m), weight 68.6 kg, SpO2 98 %.  Filed Weights   12/30/17 1026 12/31/17 0455  Weight: 67.9 kg 68.6 kg    Labs & Radiologic Studies    CBC Recent Labs    12/29/17 1422  12/30/17 1537 12/31/17 0310  WBC 10.8*  --   --  10.1  HGB 12.9   < > 10.9* 10.4*  HCT 41.5   < > 32.0* 33.8*  MCV 96.1  --   --  93.4  PLT 235  --   --  197   < > = values in this interval not displayed.   Basic Metabolic Panel Recent Labs    12/29/17 1422  12/30/17 1537 12/31/17 0310  NA 136   < > 142 137  K 4.0   < > 3.7 3.6  CL 106   < > 107 106  CO2 19*  --   --  24  GLUCOSE 101*   < > 111* 92  BUN 12   < > 10 10  CREATININE 0.94   < > 0.80 0.93  CALCIUM 9.2  --   --  8.4*  MG  --   --   --  1.9   < > = values in this interval not displayed.   Liver Function Tests Recent Labs    12/29/17 1422  AST 14*  ALT 9  ALKPHOS 91  BILITOT 0.6  PROT 6.5  ALBUMIN 3.7   No results for input(s): LIPASE, AMYLASE in the last 72  hours. Cardiac Enzymes No results for input(s): CKTOTAL, CKMB, CKMBINDEX, TROPONINI in the last 72 hours. BNP Invalid input(s): POCBNP D-Dimer No results for input(s): DDIMER in the last 72 hours. Hemoglobin A1C Recent Labs    12/29/17 1421  HGBA1C 5.5   Fasting Lipid Panel No results for input(s): CHOL, HDL, LDLCALC, TRIG, CHOLHDL, LDLDIRECT in the last 72 hours. Thyroid Function Tests No results for input(s): TSH, T4TOTAL, T3FREE, THYROIDAB in the last 72 hours.  Invalid input(s): FREET3 _____________  Dg Chest 2 View  Result Date: 12/29/2017 CLINICAL DATA:  Severe aortic stenosis, pre heart surgery, history coronary artery disease, COPD, hypertension, pulmonary hypertension EXAM: CHEST - 2 VIEW COMPARISON:  06/25/2016 FINDINGS: Borderline enlargement of cardiac silhouette. Atherosclerotic calcifications aorta. Mediastinal contours and pulmonary vascularity normal. Slightly nodular appearing opacity at the lateral RIGHT lung base is grossly unchanged since previous exam, with no definite pulmonary mass/nodule at this  site on interval CT. Bones demineralized. IMPRESSION: No acute abnormalities. Chronic density at the RIGHT cardiophrenic angle unchanged. Electronically Signed   By: Lavonia Dana M.D.   On: 12/29/2017 19:03   Ct Coronary Morph W/cta Cor W/score W/ca W/cm &/or Wo/cm  Addendum Date: 12/18/2017   ADDENDUM REPORT: 12/18/2017 12:33 CLINICAL DATA:  Aortic stenosis EXAM: Cardiac TAVR CT TECHNIQUE: The patient was scanned on a [employer] scanner. A 120 kV retrospective scan was triggered in the descending thoracic aorta at 111 HU's. Gantry rotation speed was 270 msecs and collimation was .9 mm. No beta blockade or nitro were given. The 3D data set was reconstructed in 5% intervals of the R-R cycle. Systolic and diastolic phases were analyzed on a dedicated work station using MPR, MIP and VRT modes. The patient received 80 cc of contrast. FINDINGS: Aortic Valve: Tri leaflet and  calcified with restricted leaflet motion Aorta: Moderate calcific aortic debris Sinotubular Junction: 25 mm Ascending Thoracic Aorta: 33 mm Aortic Arch: 24 mm Descending Thoracic Aorta: 26 mm Sinus of Valsalva Measurements: Non-coronary: 29 mm Right -coronary: 29 mm Left -coronary: 29.5 mm Coronary Artery Height above Annulus: Left Main: 10.4 mm above annulus Right Coronary: 15.4 mm above annulus Virtual Basal Annulus Measurements: Maximum/Minimum Diameter: 27.2 x 18.6 mm Perimeter: 75 mm Area: 423 mm2 Coronary Arteries: Sufficient height above annulus for deployment Optimum Fluoroscopic Angle for Delivery: LAO 12 Caudal 12 degrees IMPRESSION: 1. Calcified tri leaflet AV with annular area of 423 mm2 suitable for a 23 mm Sapien 3 valve 2.  No LAA thrombus 3.  Optimum angle for deployment LAO 12 Caudal 12 degrees 4.  Coronary arteries sufficient height above annulus for deployment Jenkins Rouge Electronically Signed   By: Jenkins Rouge M.D.   On: 12/18/2017 12:33   Result Date: 12/18/2017 EXAM: OVER-READ INTERPRETATION  CT CHEST The following report is an over-read performed by radiologist Dr. Vinnie Langton of [facility], [place] on 12/18/2017. This over-read does not include interpretation of cardiac or coronary anatomy or pathology. The coronary calcium score/coronary CTA interpretation by the cardiologist is attached. COMPARISON:  Chest CT 10/11/2016. FINDINGS: Extracardiac findings will be described separately under dictation for contemporaneously obtained CTA chest, abdomen and pelvis. IMPRESSION: Please see separate dictation for contemporaneously obtained CTA chest, abdomen and pelvis dated 12/18/2017 for full description of relevant extracardiac findings. Electronically Signed: By: Vinnie Langton M.D. On: 12/18/2017 12:14   Dg Chest Port 1 View  Result Date: 12/30/2017 CLINICAL DATA:  Status post transcatheter aortic valve replacement. Status post right catheter placement. EXAM: PORTABLE CHEST 1  VIEW COMPARISON:  Radiograph of December 29, 2017. FINDINGS: The heart size and mediastinal contours are within normal limits. Atherosclerosis of thoracic aorta is noted. No pneumothorax or pleural effusion is noted. Mild bibasilar scarring or subsegmental atelectasis is noted. Interval placement of right internal jugular catheter with tip directed into right subclavian vein. The visualized skeletal structures are unremarkable. IMPRESSION: Interval placement of right internal jugular catheter with tip malpositioned into right subclavian vein. Repositioning is recommended. These results will be called to the ordering clinician or representative by the Radiologist Assistant, and communication documented in the PACS or zVision Dashboard. Mild bibasilar subsegmental atelectasis or scarring. Electronically Signed   By: Marijo Conception, M.D.   On: 12/30/2017 17:49   Ct Angio Chest Aorta W &/or Wo Contrast  Result Date: 12/18/2017 CLINICAL DATA:  67 year old female with history of severe aortic stenosis. Preprocedural study prior to potential transcatheter aortic valve replacement (  TAVR) procedure. EXAM: CT ANGIOGRAPHY CHEST, ABDOMEN AND PELVIS TECHNIQUE: Multidetector CT imaging through the chest, abdomen and pelvis was performed using the standard protocol during bolus administration of intravenous contrast. Multiplanar reconstructed images and MIPs were obtained and reviewed to evaluate the vascular anatomy. CONTRAST:  155mL ISOVUE-370 IOPAMIDOL (ISOVUE-370) INJECTION 76% COMPARISON:  Chest CT 10/11/2016. FINDINGS: CTA CHEST FINDINGS Cardiovascular: Heart size is borderline enlarged. There is no significant pericardial fluid, thickening or pericardial calcification. There is aortic atherosclerosis, as well as atherosclerosis of the great vessels of the mediastinum and the coronary arteries, including calcified atherosclerotic plaque in the left anterior descending, left circumflex and right coronary arteries. Severe  thickening and calcification of the aortic valve. Calcifications of the mitral annulus. Mediastinum/Lymph Nodes: No pathologically enlarged mediastinal or hilar lymph nodes. Esophagus is unremarkable in appearance. No axillary lymphadenopathy. Lungs/Pleura: A few small pulmonary nodules are noted throughout the lungs bilaterally, largest of which is in the anterior aspect of the superior segment of the right lower lobe (axial image 48 of series 7) abutting the major fissure measuring 5 mm. No larger more suspicious appearing pulmonary nodules or masses are noted. No acute consolidative airspace disease. No pleural effusions. Dependent areas of atelectasis are noted in the lower lobes of the lungs bilaterally. Musculoskeletal/Soft Tissues: There are no aggressive appearing lytic or blastic lesions noted in the visualized portions of the skeleton. CTA ABDOMEN AND PELVIS FINDINGS Hepatobiliary: No suspicious cystic or solid hepatic lesions. No intra or extrahepatic biliary ductal dilatation. Status post cholecystectomy. Pancreas: No pancreatic mass. No pancreatic ductal dilatation. No pancreatic or peripancreatic fluid or inflammatory changes. Spleen: Unremarkable. Adrenals/Urinary Tract: Multiple low-attenuation lesions in both kidneys, compatible with simple cysts, largest of which is 2.4 cm in the periphery of the lower pole the left kidney. Other subcentimeter low-attenuation lesions are noted in the kidneys bilaterally, too small to characterize, but statistically likely to represent tiny cysts. Small nonobstructive calculi are noted within the collecting systems of the kidneys bilaterally, largest of which is in the lower pole of the right kidney measuring 3 mm. Bilateral adrenal glands are normal. No hydroureteronephrosis. Urinary bladder is normal in appearance. Stomach/Bowel: Normal appearance of the stomach. No pathologic dilatation of small bowel or colon. Numerous colonic diverticulae are noted, without  surrounding inflammatory changes to suggest an acute diverticulitis at this time. The appendix is not confidently identified and may be surgically absent. Regardless, there are no inflammatory changes noted adjacent to the cecum to suggest the presence of an acute appendicitis at this time. Vascular/Lymphatic: Aortic atherosclerosis, with vascular findings and measurements pertinent to potential TAVR procedure, as detailed below. Multifocal fusiform ectasia of the abdominal aorta. 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 Complete occlusion of the left internal iliac artery shortly after its origin. Complete occlusion of both superficial femoral arteries. No lymphadenopathy noted in the abdomen or pelvis. Reproductive: Uterus and ovaries are atrophic. Other: No significant volume of ascites.  No pneumoperitoneum. Musculoskeletal: There are no aggressive appearing lytic or blastic lesions noted in the visualized portions of the skeleton. VASCULAR MEASUREMENTS PERTINENT TO TAVR: AORTA: Minimal Aortic Diameter-11 x 11 mm Severity of Aortic Calcification-severe RIGHT PELVIS: Right Common Iliac Artery - Minimal Diameter-5.1 x 4.7 mm Tortuosity-mild Calcification-severe Right External Iliac Artery - Minimal Diameter-3.7 x 3.0 mm Tortuosity-mild Calcification-mild Right Common Femoral Artery - Minimal Diameter-4.4 x 4.2 mm Tortuosity-mild Calcification-mild LEFT PELVIS: Left Common Iliac Artery - Minimal Diameter-7.2 x 6.0 mm Tortuosity-mild Calcification-severe Left External Iliac Artery -  Minimal Diameter-4.9 x 4.9 mm Tortuosity-mild Calcification-moderate Left Common Femoral Artery - Minimal Diameter-6.7 x 6.6 mm Tortuosity-mild Calcification-mild Review of the MIP images confirms the above findings. IMPRESSION: 1. Vascular findings and measurements pertinent to potential TAVR procedure, as detailed above. 2. Severe thickening calcification of the aortic valve, compatible with the reported  clinical history of severe aortic stenosis. 3. Aortic atherosclerosis, in addition to three-vessel coronary artery disease. Please note that although the presence of coronary artery calcium documents the presence of coronary artery disease, the severity of this disease and any potential stenosis cannot be assessed on this non-gated CT examination. Assessment for potential risk factor modification, dietary therapy or pharmacologic therapy may be warranted, if clinically indicated. 4. Multiple small pulmonary nodules noted throughout the lungs bilaterally, largest of which measures 5 mm in the right lower lobe. No follow-up needed if patient is low-risk (and has no known or suspected primary neoplasm). Non-contrast chest CT can be considered in 12 months if patient is high-risk. This recommendation follows the consensus statement: Guidelines for Management of Incidental Pulmonary Nodules Detected on CT Images: From the Fleischner Society 2017; Radiology 2017; 284:228-243. 5. Colonic diverticulosis without evidence of acute diverticulitis at this time. 6. Additional incidental findings, as above. Electronically Signed   By: Vinnie Langton M.D.   On: 12/18/2017 14:37   Ct Angio Abd/pel W/ And/or W/o  Result Date: 12/18/2017 CLINICAL DATA:  67 year old female with history of severe aortic stenosis. Preprocedural study prior to potential transcatheter aortic valve replacement (TAVR) procedure. EXAM: CT ANGIOGRAPHY CHEST, ABDOMEN AND PELVIS TECHNIQUE: Multidetector CT imaging through the chest, abdomen and pelvis was performed using the standard protocol during bolus administration of intravenous contrast. Multiplanar reconstructed images and MIPs were obtained and reviewed to evaluate the vascular anatomy. CONTRAST:  116mL ISOVUE-370 IOPAMIDOL (ISOVUE-370) INJECTION 76% COMPARISON:  Chest CT 10/11/2016. FINDINGS: CTA CHEST FINDINGS Cardiovascular: Heart size is borderline enlarged. There is no significant  pericardial fluid, thickening or pericardial calcification. There is aortic atherosclerosis, as well as atherosclerosis of the great vessels of the mediastinum and the coronary arteries, including calcified atherosclerotic plaque in the left anterior descending, left circumflex and right coronary arteries. Severe thickening and calcification of the aortic valve. Calcifications of the mitral annulus. Mediastinum/Lymph Nodes: No pathologically enlarged mediastinal or hilar lymph nodes. Esophagus is unremarkable in appearance. No axillary lymphadenopathy. Lungs/Pleura: A few small pulmonary nodules are noted throughout the lungs bilaterally, largest of which is in the anterior aspect of the superior segment of the right lower lobe (axial image 48 of series 7) abutting the major fissure measuring 5 mm. No larger more suspicious appearing pulmonary nodules or masses are noted. No acute consolidative airspace disease. No pleural effusions. Dependent areas of atelectasis are noted in the lower lobes of the lungs bilaterally. Musculoskeletal/Soft Tissues: There are no aggressive appearing lytic or blastic lesions noted in the visualized portions of the skeleton. CTA ABDOMEN AND PELVIS FINDINGS Hepatobiliary: No suspicious cystic or solid hepatic lesions. No intra or extrahepatic biliary ductal dilatation. Status post cholecystectomy. Pancreas: No pancreatic mass. No pancreatic ductal dilatation. No pancreatic or peripancreatic fluid or inflammatory changes. Spleen: Unremarkable. Adrenals/Urinary Tract: Multiple low-attenuation lesions in both kidneys, compatible with simple cysts, largest of which is 2.4 cm in the periphery of the lower pole the left kidney. Other subcentimeter low-attenuation lesions are noted in the kidneys bilaterally, too small to characterize, but statistically likely to represent tiny cysts. Small nonobstructive calculi are noted within the collecting systems of the kidneys bilaterally,  largest of  which is in the lower pole of the right kidney measuring 3 mm. Bilateral adrenal glands are normal. No hydroureteronephrosis. Urinary bladder is normal in appearance. Stomach/Bowel: Normal appearance of the stomach. No pathologic dilatation of small bowel or colon. Numerous colonic diverticulae are noted, without surrounding inflammatory changes to suggest an acute diverticulitis at this time. The appendix is not confidently identified and may be surgically absent. Regardless, there are no inflammatory changes noted adjacent to the cecum to suggest the presence of an acute appendicitis at this time. Vascular/Lymphatic: Aortic atherosclerosis, with vascular findings and measurements pertinent to potential TAVR procedure, as detailed below. Multifocal fusiform ectasia of the abdominal aorta. 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 Complete occlusion of the left internal iliac artery shortly after its origin. Complete occlusion of both superficial femoral arteries. No lymphadenopathy noted in the abdomen or pelvis. Reproductive: Uterus and ovaries are atrophic. Other: No significant volume of ascites.  No pneumoperitoneum. Musculoskeletal: There are no aggressive appearing lytic or blastic lesions noted in the visualized portions of the skeleton. VASCULAR MEASUREMENTS PERTINENT TO TAVR: AORTA: Minimal Aortic Diameter-11 x 11 mm Severity of Aortic Calcification-severe RIGHT PELVIS: Right Common Iliac Artery - Minimal Diameter-5.1 x 4.7 mm Tortuosity-mild Calcification-severe Right External Iliac Artery - Minimal Diameter-3.7 x 3.0 mm Tortuosity-mild Calcification-mild Right Common Femoral Artery - Minimal Diameter-4.4 x 4.2 mm Tortuosity-mild Calcification-mild LEFT PELVIS: Left Common Iliac Artery - Minimal Diameter-7.2 x 6.0 mm Tortuosity-mild Calcification-severe Left External Iliac Artery - Minimal Diameter-4.9 x 4.9 mm Tortuosity-mild Calcification-moderate Left Common Femoral Artery -  Minimal Diameter-6.7 x 6.6 mm Tortuosity-mild Calcification-mild Review of the MIP images confirms the above findings. IMPRESSION: 1. Vascular findings and measurements pertinent to potential TAVR procedure, as detailed above. 2. Severe thickening calcification of the aortic valve, compatible with the reported clinical history of severe aortic stenosis. 3. Aortic atherosclerosis, in addition to three-vessel coronary artery disease. Please note that although the presence of coronary artery calcium documents the presence of coronary artery disease, the severity of this disease and any potential stenosis cannot be assessed on this non-gated CT examination. Assessment for potential risk factor modification, dietary therapy or pharmacologic therapy may be warranted, if clinically indicated. 4. Multiple small pulmonary nodules noted throughout the lungs bilaterally, largest of which measures 5 mm in the right lower lobe. No follow-up needed if patient is low-risk (and has no known or suspected primary neoplasm). Non-contrast chest CT can be considered in 12 months if patient is high-risk. This recommendation follows the consensus statement: Guidelines for Management of Incidental Pulmonary Nodules Detected on CT Images: From the Fleischner Society 2017; Radiology 2017; 284:228-243. 5. Colonic diverticulosis without evidence of acute diverticulitis at this time. 6. Additional incidental findings, as above. Electronically Signed   By: Vinnie Langton M.D.   On: 12/18/2017 14:37   Disposition   Pt is being discharged home today in good condition.  Follow-up Plans & Appointments    Follow-up Information    Eileen Stanford, PA-C. Go on 01/08/2018.   Specialties:  Cardiology, Radiology Why:  @ 3:30pm, please arrive at least 10 minutes early Contact information: [place] [address] [phone]          Discharge Instructions    Amb Referral to Cardiac Rehabilitation    Complete by:  As directed    Diagnosis:  Valve Replacement   Valve:  Aortic      Discharge Medications   Allergies as  of 12/31/2017      Reactions   Codeine Nausea Only   Symproic [naldemedine] Nausea And Vomiting      Medication List    STOP taking these medications   diltiazem 120 MG 24 hr capsule Commonly known as:  CARDIZEM CD     TAKE these medications   aspirin 81 MG tablet Take 1 tablet (81 mg total) by mouth daily. What changed:    when to take this  reasons to take this   Azelastine HCl 0.15 % Soln Place 2 sprays into the nose 2 (two) times daily as needed (allergies).   bisoprolol-hydrochlorothiazide 10-6.25 MG tablet Commonly known as:  ZIAC Take 1 tablet by mouth daily.   cetirizine 10 MG tablet Commonly known as:  ZYRTEC Take 1 tablet (10 mg total) by mouth at bedtime.   clopidogrel 75 MG tablet Commonly known as:  PLAVIX Take 1 tablet (75 mg total) by mouth daily with breakfast. Start taking on:  01/01/2018   fluticasone 220 MCG/ACT inhaler Commonly known as:  FLOVENT HFA Inhale 2 puffs into the lungs 2 (two) times daily. What changed:    when to take this  reasons to take this   furosemide 20 MG tablet Commonly known as:  LASIX Take 2 tabs in AM What changed:    how much to take  how to take this  when to take this  additional instructions   losartan 100 MG tablet Commonly known as:  COZAAR Take 1 tablet (100 mg total) by mouth daily.   montelukast 10 MG tablet Commonly known as:  SINGULAIR Take 1 tablet (10 mg total) by mouth at bedtime.   oxybutynin 10 MG 24 hr tablet Commonly known as:  DITROPAN-XL TAKE 1 TABLET BY MOUTH ONCE DAILY AT BEDTIME   oxyCODONE-acetaminophen 10-325 MG tablet Commonly known as:  PERCOCET Take 1 tablet by mouth 4 (four) times daily as needed for pain.   potassium chloride SA 20 MEQ tablet Commonly known as:  K-DUR,KLOR-CON Take 1 tablet (20 mEq total) by mouth daily.   rosuvastatin 10 MG  tablet Commonly known as:  CRESTOR Take 1 tablet (10 mg total) by mouth daily.   Vitamin D (Ergocalciferol) 50000 units Caps capsule Commonly known as:  DRISDOL Take 1 capsule (50,000 Units total) by mouth every 7 (seven) days. What changed:  when to take this         Outstanding Labs/Studies   none  Duration of Discharge Encounter   Greater than 30 minutes including physician time.  Mable Fill, PA-C 12/31/2017, 11:54 AM [phone]

## 2017-12-31 NOTE — Progress Notes (Addendum)
[facility] TE[facility]  Patient Name: Alexandra [name] Date of Encounter: 12/31/2017  Primary Cardiologist: Dr. Ellyn Hack Dr. Angelena Form & Dr. Cyndia Bent (TAVR)  Hospital Problem List     Principal Problem:   S/P TAVR (transcatheter aortic valve replacement) Active Problems:   Essential hypertension   Chronic pain syndrome   Osteopenia   COPD (chronic obstructive pulmonary disease) (HCC)   Smoker   Coronary artery disease, non-occlusive   Chronic fatigue   GERD (gastroesophageal reflux disease)   Hypertension   Hyperlipidemia   Severe aortic stenosis   Pulmonary nodules   Acute on chronic diastolic heart failure (HCC)     Subjective   Feeling well. Anxious to get home. Was up walking at 4 am and breathing well. Right groin a little tender  Inpatient Medications    Scheduled Meds: . aspirin  81 mg Oral Daily  . clopidogrel  75 mg Oral Q breakfast  . oxybutynin  10 mg Oral QHS  . rosuvastatin  10 mg Oral Daily  . sodium chloride flush  3 mL Intravenous Q12H   Continuous Infusions: . sodium chloride    . cefUROXime (ZINACEF)  IV Stopped (12/30/17 2113)  . nitroGLYCERIN     PRN Meds: sodium chloride, acetaminophen **OR** acetaminophen, metoprolol tartrate, morphine injection, ondansetron (ZOFRAN) IV, oxyCODONE, sodium chloride flush, traMADol   Vital Signs    Vitals:   12/30/17 2315 12/31/17 0205 12/31/17 0455 12/31/17 0759  BP: 136/65 (!) 153/68 126/71 (!) 145/69  Pulse:    (!) 58  Resp: (!) 22 20 19 18   Temp: 99.1 F (37.3 C)  98.6 F (37 C) (!) 97.3 F (36.3 C)  TempSrc: Oral  Oral Oral  SpO2: 97% 95% 97% 98%  Weight:   68.6 kg   Height:        Intake/Output Summary (Last 24 hours) at 12/31/2017 0819 Last data filed at 12/31/2017 0500 Gross per 24 hour  Intake 3282.88 ml  Output 725 ml  Net 2557.88 ml   Filed Weights   12/30/17 1026 12/31/17 0455  Weight: 67.9 kg 68.6 kg    Physical Exam   GEN: Well  nourished, well developed, in no acute distress.  HEENT: Grossly normal.  Neck: Supple, no JVD, + bilateral carotid bruits. no masses. Cardiac: RRR, soft flow murmur. No rubs, or gallops. No clubbing, cyanosis, edema.  Radials/DP/PT 2+ and equal bilaterally.  Respiratory:  Respirations regular and unlabored, clear to auscultation bilaterally. GI: Soft, nontender, nondistended, BS + x 4. MS: no deformity or atrophy. Skin: warm and dry, no rash. Right groin with small hematoma, tender to palpation  Neuro:  Strength and sensation are intact. Psych: AAOx3.  Normal affect.  Labs    CBC Recent Labs    12/29/17 1422  12/30/17 1537 12/31/17 0310  WBC 10.8*  --   --  10.1  HGB 12.9   < > 10.9* 10.4*  HCT 41.5   < > 32.0* 33.8*  MCV 96.1  --   --  93.4  PLT 235  --   --  197   < > = values in this interval not displayed.   Basic Metabolic Panel Recent Labs    12/29/17 1422  12/30/17 1537 12/31/17 0310  NA 136   < > 142 137  K 4.0   < > 3.7 3.6  CL 106   < > 107 106  CO2 19*  --   --  24  GLUCOSE  101*   < > 111* 92  BUN 12   < > 10 10  CREATININE 0.94   < > 0.80 0.93  CALCIUM 9.2  --   --  8.4*  MG  --   --   --  1.9   < > = values in this interval not displayed.   Liver Function Tests Recent Labs    12/29/17 1422  AST 14*  ALT 9  ALKPHOS 91  BILITOT 0.6  PROT 6.5  ALBUMIN 3.7   No results for input(s): LIPASE, AMYLASE in the last 72 hours. Cardiac Enzymes No results for input(s): CKTOTAL, CKMB, CKMBINDEX, TROPONINI in the last 72 hours. BNP Invalid input(s): POCBNP D-Dimer No results for input(s): DDIMER in the last 72 hours. Hemoglobin A1C Recent Labs    12/29/17 1421  HGBA1C 5.5   Fasting Lipid Panel No results for input(s): CHOL, HDL, LDLCALC, TRIG, CHOLHDL, LDLDIRECT in the last 72 hours. Thyroid Function Tests No results for input(s): TSH, T4TOTAL, T3FREE, THYROIDAB in the last 72 hours.  Invalid input(s): FREET3  Telemetry    Sinus with some  sinus brady, 4 beats NSVT - Personally Reviewed  ECG    Sinus brady, LBBB HR 56- Personally Reviewed  Radiology    Dg Chest 2 View  Result Date: 12/29/2017 CLINICAL DATA:  Severe aortic stenosis, pre heart surgery, history coronary artery disease, COPD, hypertension, pulmonary hypertension EXAM: CHEST - 2 VIEW COMPARISON:  06/25/2016 FINDINGS: Borderline enlargement of cardiac silhouette. Atherosclerotic calcifications aorta. Mediastinal contours and pulmonary vascularity normal. Slightly nodular appearing opacity at the lateral RIGHT lung base is grossly unchanged since previous exam, with no definite pulmonary mass/nodule at this site on interval CT. Bones demineralized. IMPRESSION: No acute abnormalities. Chronic density at the RIGHT cardiophrenic angle unchanged. Electronically Signed   By: Lavonia Dana M.D.   On: 12/29/2017 19:03   Dg Chest Port 1 View  Result Date: 12/30/2017 CLINICAL DATA:  Status post transcatheter aortic valve replacement. Status post right catheter placement. EXAM: PORTABLE CHEST 1 VIEW COMPARISON:  Radiograph of December 29, 2017. FINDINGS: The heart size and mediastinal contours are within normal limits. Atherosclerosis of thoracic aorta is noted. No pneumothorax or pleural effusion is noted. Mild bibasilar scarring or subsegmental atelectasis is noted. Interval placement of right internal jugular catheter with tip directed into right subclavian vein. The visualized skeletal structures are unremarkable. IMPRESSION: Interval placement of right internal jugular catheter with tip malpositioned into right subclavian vein. Repositioning is recommended. These results will be called to the ordering clinician or representative by the Radiologist Assistant, and communication documented in the PACS or zVision Dashboard. Mild bibasilar subsegmental atelectasis or scarring. Electronically Signed   By: Marijo Conception, M.D.   On: 12/30/2017 17:49    Cardiac Studies   TAVR OPERATIVE  NOTE   Date of Procedure:                12/30/2017  Preoperative Diagnosis:      Severe Aortic Stenosis   Postoperative Diagnosis:    Same   Procedure:        Transcatheter Aortic Valve Replacement - Transfemoral Approach             Edwards Sapien 3 THV (size 23 mm, model # F048547, serial # [id number])              Co-Surgeons:  Lauree Chandler, MD and Gaye Pollack, MD   Anesthesiologist:                  Annye Asa  Echocardiographer:              Johnsie Cancel  Pre-operative Echo Findings: ? Severe aortic stenosis ? Normal left ventricular systolic function  Post-operative Echo Findings: ? Mild paravalvular leak ? Normal left ventricular systolic function  ____________________  Echo 12/31/17: pending  Patient Profile     Alexandra [name] is a 67 y.o. female with a history of HTN, HLD,COPD, chronic diastolic CHF, pulmonary HTN, tobacco abuse, CADand severe aortic stenosis who presented to [facility] on 12/30/17 for planned TAVR.   Assessment & Plan    Severe AS: s/p successful TAVR with a 23 mm Edwards Sapien 3 THV via the TF approach on 12/30/17. Post operative echo pending. Groin sites are stable. ECG with sinus and old LBBB and no high grade heart block. Continue asa and plavix. Likely discharge home today after echo.   HTN: BP mildly elevated. Will resume home anti hypertensive with the exception of home diltiazem given sinus bradycardia.  Acute on chronic diastolic CHF: as evidenced by an elevated BNP ~1300 and progressive symptoms of shortness of breath on admission. This has been treated with  TAVR. I have also resumed her home lasix.   COPD: stable. Counseled on tobacco cessation, but not ready to quit. Only smoke "2 cigarettes per day"  HLD: continue statin    Signed, Angelena Form, PA-C  12/31/2017, 8:19 AM  Pager [phone]   Chart reviewed, patient examined, agree with above. She feels well and ambulated this am.    Groin sites ok ECG: sinus brady with old LBBB. Echo pending. Plan home later today after echo.

## 2017-12-31 NOTE — Care Management Note (Signed)
Case Management Note Marvetta Gibbons RN, BSN Transitions of Care Unit 4E- RN Case Manager [phone]  Patient Details  Name: Alexandra [name] MRN: [id number] Date of Birth: [birth year]  Subjective/Objective:     Pt admitted s/p TAVR               Action/Plan: PTA pt lived at home with spouse- plan to return home with spouse- no CM needs noted for transition home.   Expected Discharge Date:  12/31/17               Expected Discharge Plan:  Home/Self Care  In-House Referral:  NA  Discharge planning Services  CM Consult  Post Acute Care Choice:  NA Choice offered to:  NA  DME Arranged:    DME Agency:     HH Arranged:    HH Agency:     Status of Service:  Completed, signed off  If discussed at [place] of Stay Meetings, dates discussed:    Discharge Disposition: home/self care   Additional Comments:  Dawayne Patricia, RN 12/31/2017, 12:09 PM

## 2018-01-02 ENCOUNTER — Telehealth (HOSPITAL_COMMUNITY): Payer: Self-pay

## 2018-01-02 NOTE — Telephone Encounter (Signed)
Pt insurance is active and benefits verified through [employer]. Co-pay $45.00, DED $0.00/$0.00 met, out of pocket $5,900.00/$320.00 met, co-insurance 20%. No pre-authorization. Estelle Grumbles, 01/02/18 @ 9:37AM, REF# [id number]  Patients insurance is active through Medicaid. BOF#[id number]  Will fax over [facility] Reimbursement form to Dr. Ellyn Hack.  Will contact patient to see if she is interested in the Cardiac Rehab Program. If interested, patient will need to complete follow up appt. Once completed, patient will be contacted for scheduling upon review by the RN Navigator.

## 2018-01-02 NOTE — Telephone Encounter (Signed)
Called patient to see if she is interested in the Cardiac Rehab Program. Patient expressed interest. Explained scheduling process and went over insurance, patient verbalized understanding. Will contact patient for scheduling once f/u has been completed. 

## 2018-01-02 NOTE — Telephone Encounter (Signed)
Patient returned CR phone call and stated she is not interested in joining the program at the time.  Closed referral

## 2018-01-08 ENCOUNTER — Ambulatory Visit: Payer: Medicare HMO | Admitting: Physician Assistant

## 2018-01-13 NOTE — Progress Notes (Signed)
[facility] [place]                                       Cardiology Office Note    Date:  01/14/2018   ID:  Alexandra [name], DOB August 26, 1950, MRN [id number]  PCP:  [facility]  Cardiologist: Dr. Ellyn Hack Dr. Angelena Form & Dr. Cyndia Bent (TAVR)  CC: [facility] s/p TAVR   History of Present Illness:  Alexandra [name] is a 67 y.o. female with a history of HTN, HLD,COPD, chronic diastolic CHF, pulmonary HTN, LBBB, ongoing tobacco abuseandsevereaortic stenosiss/p TAVR (12/30/17) who presents to clinic for follow up.  An echo in 10/2016 showed a mean aortic valve gradient of 31 mmHg with a dimensionless index of 0.24. She was seen in our office at that time by Dr. Darcey Nora who was seeing her for a right lower lobe nodular density which resolved on subsequent CT scan. She was not having any clear symptoms of congestive heart failure and was therefore referred back to cardiology for follow-up. Over the past several months she has developed exertional fatigue and shortness of breath as well as two-pillow orthopnea. She has been followed by Dr. Aundra Dubin and was treated for volume overload with Lasix. She had a follow-up echocardiogram on 10/31/2017 which showed a mean valve gradient of 32 mmHg with a peak of 62 mmHg. The dimensionless index was 0.28. There was mild to moderate aortic insufficiency. Left ventricular ejection fraction was 55 to 60% with grade 1 diastolic dysfunction. She subsequently underwent R/LHC by Dr. Aundra Dubin on 11/20/2017 which showed nonobstructive coronary disease, mild pulmonary hypertension and low right heart pressures. Lasix was reduced.  She was evaluated by the multidisciplinary valve team and ultimately underwent successful TAVR with a12mm Edwards Sapien 3 THV via the TF approach on 12/30/17. Post operative echo showed EF 60%, normally functioning TAVR valve with mean gradient of 22 mm Hg and no PVL. Diltiazem was  discontinued at discharge given sinus bradycardia.  Today she presents to clinic for follow up. She was scheduled to come last week but felt too weak to come. She continues to have some weakness. She denies fevers or chills. She denies UTI symptoms or cough. No other complaints. No CP or SOB. No LE edema, orthopnea or PND. No dizziness or syncope. No blood in stool or urine. No palpitations. She can tell a difference in her breathing since having surgery. She is also concerned with a painless rash on her left LE.    Past Medical History:  Diagnosis Date  . COPD (chronic obstructive pulmonary disease) ([place]) 05/22/2016  . Coronary artery disease, non-occlusive 11/28/2016   R&LHC: 65% distal LAD lesion -- likely not angiographically significant->medical management. Normal LVEF. Moderately elevated LVEDP. I aortic valve gradient in the Cath Lab, moderate aortic stenosis. This would suggest that the stenosis is on the moderate side of moderate to severe.  Severe pulmonary hypertension (likely mixed).  AVA 0.96 cm; P-P gradient ~ 30 mmHg, Mean ~24.5-27.5 (Moderate AS)   . GERD (gastroesophageal reflux disease)   . Heart murmur   . Hx of adenomatous colonic polyps 10/19/2015  . Hyperlipidemia   . Hypertension   . Moderate mitral regurgitation by prior echocardiography 10/2016   Echocardiogram revealed moderate mitral regurgitation  . Pulmonary hypertension associated Aortic Stenosis, Mitral Regurgitation & COPD    Peak PAP by Echo ~  54 mmHg;  by Cath PAP/Mean 71/32 mmHg, 53 mmHg (with PCWP & LVEDP ~24-26 mmHg); TPG ~26 mmHg  . Pulmonary nodules   . S/P TAVR (transcatheter aortic valve replacement)   . Severe aortic stenosis     Past Surgical History:  Procedure Laterality Date  . APPENDECTOMY    . BREAST CYST EXCISION Right 1970  . CARDIAC VALVE REPLACEMENT    . CATARACT EXTRACTION W/ INTRAOCULAR LENS IMPLANT Right   . CHOLECYSTECTOMY OPEN    . COLONOSCOPY  2003, 2017  . CTA Chest  09/2016    Thoracic Aortic Ca2+ w/o dilation.  Coronary Calcification noted. PA normal. Mild Emphysematous changes w/ mild LLL scarring.    [name] FRACTURE SURGERY     2007 -right tib /fib fracture ----07/2009 right femur fx after a fall  . FRACTURE SURGERY Right 2011   Femur  . PERIPHERAL VASCULAR CATHETERIZATION N/A 08/09/2014   Procedure: Aortic Arch Angiography;  Surgeon: Serafina Mitchell, MD;  Location: [facility];  Service: Cardiovascular: Type 1 Arch. No significant stenosis, aneurysmal degeneration or dissection.  No luminal irregularity seen in R or L SubClavian, Brachial or Radial A. Chronic distal ulnar A occlusion [place].  [name] PERIPHERAL VASCULAR CATHETERIZATION Right 08/09/2014   Procedure: Upper Extremity Angiography;  Surgeon: Serafina Mitchell, MD;  Location: [facility];  Service: Cardiovascular;  Laterality: Right;  . RIGHT/LEFT HEART CATH AND CORONARY ANGIOGRAPHY N/A 12/06/2016   Procedure: RIGHT/LEFT HEART CATH AND CORONARY ANGIOGRAPHY;  Surgeon: Leonie Man, MD;  [facility]: [facility]: Cor Angio: 65% dLAD (Med Rx). AVA 0.96 cm; P-P gradient ~ 30 mmHg, Mean ~24.5-27.5 (Mod AS); RHC #s: RAP 8 mmHg, RVP/EDP: 71/8/15 mmHg, PCWP: 21-24 mmHg, PAP/mean: 71/32/52 mmHg = Severe Mixed Pulmonary HTN; LVP/EDP 205/17/26 mmHg ; CO/CI by Fick: 4.35, 2.44 - mildly reduced  . RIGHT/LEFT HEART CATH AND CORONARY ANGIOGRAPHY N/A 11/20/2017   Procedure: RIGHT/LEFT HEART CATH AND CORONARY ANGIOGRAPHY;  Surgeon: Larey Dresser, MD;  Location: [facility];  Service: Cardiovascular;  Laterality: N/A;  . TEE WITHOUT CARDIOVERSION N/A 12/30/2017   Procedure: TRANSESOPHAGEAL ECHOCARDIOGRAM (TEE);  Surgeon: Burnell Blanks, MD;  Location: [facility];  Service: Open Heart Surgery;  Laterality: N/A;  . TRANSCATHETER AORTIC VALVE REPLACEMENT, TRANSFEMORAL  12/30/2017  . TRANSCATHETER AORTIC VALVE REPLACEMENT, TRANSFEMORAL N/A 12/30/2017   Procedure: TRANSCATHETER AORTIC VALVE REPLACEMENT,  TRANSFEMORAL;  Surgeon: Burnell Blanks, MD;  Location: [facility];  Service: Open Heart Surgery;  Laterality: N/A;  . TRANSTHORACIC ECHOCARDIOGRAM  10/30/2016   Mild Concentric LVH. EF 55-60%. No RWMA, Gr 2 DD. Mod-Severe AS (mean-peak Gradient 31 mmHG - 64 mmHg), Mod MR. Mod LA dilation, Mod PA HTN - peak pressure ~54 mmHg). = Progression of AS from 2014  . ULTRASOUND GUIDANCE FOR VASCULAR ACCESS  08/09/2014   Procedure: Ultrasound Guidance For Vascular Access;  Surgeon: Serafina Mitchell, MD;  Location: [facility];  Service: Cardiovascular;;    Current Medications: Outpatient Medications Prior to Visit  Medication Sig Dispense Refill  . aspirin 81 MG tablet Take 1 tablet (81 mg total) by mouth daily. 30 tablet   . Azelastine HCl 0.15 % SOLN Place 2 sprays into the nose 2 (two) times daily as needed (allergies). 30 mL PRN  . bisoprolol-hydrochlorothiazide (ZIAC) 10-6.25 MG tablet Take 1 tablet by mouth daily. 90 tablet 0  . cetirizine (ZYRTEC) 10 MG tablet Take 1 tablet (10 mg total) by mouth at bedtime. 30 tablet PRN  .  clopidogrel (PLAVIX) 75 MG tablet Take 1 tablet (75 mg total) by mouth daily with breakfast. 90 tablet 1  . fluticasone (FLOVENT HFA) 220 MCG/ACT inhaler Inhale 2 puffs into the lungs 2 (two) times daily. (Patient taking differently: Inhale 2 puffs into the lungs 2 (two) times daily as needed (for shortness of breath or wheezing). ) 1 Inhaler 12  . furosemide (LASIX) 20 MG tablet Take 2 tabs in AM (Patient taking differently: Take 40 mg by mouth daily. ) 90 tablet 3  . losartan (COZAAR) 100 MG tablet Take 1 tablet (100 mg total) by mouth daily. 90 tablet 3  . montelukast (SINGULAIR) 10 MG tablet Take 1 tablet (10 mg total) by mouth at bedtime. 30 tablet 3  . oxybutynin (DITROPAN-XL) 10 MG 24 hr tablet TAKE 1 TABLET BY MOUTH ONCE DAILY AT BEDTIME (Patient taking differently: Take 10 mg by mouth at bedtime. ) 90 tablet 0  . oxyCODONE-acetaminophen (PERCOCET) 10-325 MG  tablet Take 1 tablet by mouth 4 (four) times daily as needed for pain.     . potassium chloride SA (K-DUR,KLOR-CON) 20 MEQ tablet Take 1 tablet (20 mEq total) by mouth daily. 30 tablet 6  . rosuvastatin (CRESTOR) 10 MG tablet Take 1 tablet (10 mg total) by mouth daily. 90 tablet 3  . Vitamin D, Ergocalciferol, (DRISDOL) 50000 units CAPS capsule Take 1 capsule (50,000 Units total) by mouth every 7 (seven) days. (Patient taking differently: Take 50,000 Units by mouth every Tuesday. ) 30 capsule 1   No facility-administered medications prior to visit.      Allergies:   Codeine and Symproic [naldemedine]   Social History   Socioeconomic History  . Marital status: Married    Spouse name: [name]  . Number of children: 3  . Years of education: 12th grade  . Highest education level: Not on file  Occupational History  . 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 Financial resource strain: Not on file  . Food insecurity:    Worry: Not on file    Inability: Not on file  . Transportation needs:    Medical: Not on file    Non-medical: Not on file  Tobacco Use  . Smoking status: Light Tobacco Smoker    Packs/day: 0.10    Years: 40.00    Pack years: 4.00    Types: Cigarettes  . Smokeless tobacco: Never Used  Substance and Sexual Activity  . Alcohol use: Not Currently    Alcohol/week: 0.0 standard drinks  . Drug use: Never    Comment: +cocaine in urine 12/19/2014; pt denies this hx on 12/30/2017  . Sexual activity: Not Currently    Partners: Male  Lifestyle  . Physical activity:    Days per week: Not on file    Minutes per session: Not on file  . Stress: Not on file  Relationships  . Social connections:    Talks on phone: Not on file    Gets together: Not on file    Attends religious service: Not on file    Active member of club or organization: Not on file    Attends meetings of clubs or  organizations: Not on file    Relationship status: Not on file  Other Topics Concern  . Not on file  Social History Narrative   Lives with her husband and her middle son.   Her two older sons are products of a previous  relationship.   Her youngest son is from her current marriage.   Oldest and youngest sons live nearby.   She has total of 3 children and 6 grandchildren with 2 great-grandchildren.   She does not exercise because she has pain in her right leg.     Family History:  The patient's family history includes Heart attack in her brother; Heart disease in her father and mother; Hypertension in her mother.      ROS:   Please see the history of present illness.    ROS All other systems reviewed and are negative.   PHYSICAL EXAM:   VS:  BP (!) 180/80   Pulse (!) 53   Ht 5\' 6"  (1.676 m)   Wt 148 lb (67.1 kg)   BMI 23.89 kg/m    GEN: Well nourished, well developed, in no acute distress HEENT: normal Neck: no JVD or masses Cardiac: RRR; 2/6 SEM. No rubs, or gallops,no edema  Respiratory:  clear to auscultation bilaterally, normal work of breathing GI: soft, nontender, nondistended, + BS MS: no deformity or atrophy Skin: warm and dry, no rash. Left groin site with a small non tender nodule. Left leg with a diffuse speckled rash  Neuro:  Alert and Oriented x 3, Strength and sensation are intact Psych: euthymic mood, full affect    Wt Readings from Last 3 Encounters:  01/14/18 148 lb (67.1 kg)  12/31/17 151 lb 3.8 oz (68.6 kg)  12/29/17 149 lb 12.8 oz (67.9 kg)      Studies/Labs Reviewed:   EKG:  EKG is ordered today.  The ekg ordered today demonstrates sinus brady, LBBB HR 53  Recent Labs: 12/02/2017: TSH 1.642 12/29/2017: ALT 9; B Natriuretic Peptide 1,354.2 12/31/2017: BUN 10; Creatinine, Ser 0.93; Hemoglobin 10.4; Magnesium 1.9; Platelets 197; Potassium 3.6; Sodium 137   Lipid Panel    Component Value Date/Time   CHOL 234 (H) 06/17/2017 1116   TRIG 147  06/17/2017 1116   HDL 39 (L) 06/17/2017 1116   CHOLHDL 6.0 (H) 06/17/2017 1116   CHOLHDL 3.6 02/20/2016 1156   VLDL 19 02/20/2016 1156   LDLCALC 166 (H) 06/17/2017 1116    Additional studies/ records that were reviewed today include:   TAVR OPERATIVE NOTE   Date of Procedure:12/30/2017  Preoperative Diagnosis:Severe Aortic Stenosis   Postoperative Diagnosis:Same   Procedure:   Transcatheter Aortic Valve Replacement - Transfemoral Approach Edwards Sapien 3 THV (size 66mm, model # F048547, serial #[id number])  Co-Surgeons:Christopher Angelena Form, MD and Gaye Pollack, MD   Pre-operative Echo Findings: ? Severe aortic stenosis ? Normalleft ventricular systolic function  Post-operative Echo Findings: ? Mildparavalvular leak ? Normalleft ventricular systolic function  ____________________  Echo 12/31/17 Study Conclusions - Left ventricle: The cavity size was normal. There was moderate   concentric hypertrophy. Systolic function was normal. The   estimated ejection fraction was in the range of 60% to 65%. Wall   motion was normal; there were no regional wall motion   abnormalities. There was an increased relative contribution of   atrial contraction to ventricular filling. Doppler parameters are   consistent with abnormal left ventricular relaxation (grade 1   diastolic dysfunction). - Aortic valve: S/P 79mm Edwards Sapien AV bioprosthesis. There is   no perivalvular AI. Increased gradient across prosthesis with   peak velocity 334 c/s and mean AVG 32mmHg. Valve area (VTI): 1.1   cm^2. Valve area (Vmax): 0.96 cm^2. Valve area (Vmean): 1.1 cm^2. - Mitral valve: Calcified annulus. There was mild regurgitation.  Valve area by pressure half-time: 2.18 cm^2. - Atrial septum: There was increased thickness of the septum,   consistent with lipomatous hypertrophy. - Tricuspid valve: There was mild  regurgitation. - Pulmonic valve: There was trivial regurgitation.  ASSESSMENT & PLAN:   Severe AS s/p TAVR: she is doing well overall, but having some non specific generalized weakness and fatigue. Groin sites are stable. Left groin with a small non tender nodule with no bruit. Continue ASA and plavix. SBE prophylaxis discussed; amoxicillin called in for her upcoming denture refitting.  I will see her back at 1 month for follow up and echo.   HTN: BP elevated. Add amlodipine 5mg  daily and follow  Chronic diastolic OOI:[id number] euvolemic. Continue lasix 40mg  daily.   COPD: stable.  HLD: continue statin   Weakness: she looks okay. No associated symptoms. Will check basic lab work including a CBC, TSH and BMET  Pulmonary nodules: seen on pre op TAVR scans. She has been followed for a right pulmonary nodule in the past that apparently resolved on its own. Radiology has recommended follow up with a non contrast CT at 12 months if high risk (she is a long time, heavy smoker.) I will have this set up for 11/2018  Rash: on side of valve deployment. Likely atheroemboli from the procedure. Asymptomatic. Continue to follow .  Medication Adjustments/Labs and Tests Ordered: Current medicines are reviewed at length with the patient today.  Concerns regarding medicines are outlined above.  Medication changes, Labs and Tests ordered today are listed in the Patient Instructions below. Patient Instructions  Medication Instructions:  1) Your physician discussed the importance of taking an antibiotic prior to any dental visits to prevent damage to the heart valves from infection. You were given a prescription for AMOXIL 2,000mg  to take 1 hour prior to dental visits.  Labwork: TODAY: BMET, TSH, CBC  Testing/Procedures: No new orders   Follow-Up: Please keep your appointments on November 13. Please arrive to our office by 12:30PM for your echo and subsequent office visit.   Any Other Special  Instructions Will Be Listed Below (If Applicable). You are cleared to drive from a cardiac standpoint!    Signed, Angelena Form, PA-C  01/14/2018 2:36 PM    [facility] [address], [place], [place]  [address] Phone: [phone]; Fax: [phone]

## 2018-01-14 ENCOUNTER — Encounter: Payer: Self-pay | Admitting: Physician Assistant

## 2018-01-14 ENCOUNTER — Ambulatory Visit (INDEPENDENT_AMBULATORY_CARE_PROVIDER_SITE_OTHER): Payer: Medicare HMO | Admitting: Physician Assistant

## 2018-01-14 VITALS — BP 180/80 | HR 53 | Ht 66.0 in | Wt 148.0 lb

## 2018-01-14 DIAGNOSIS — I10 Essential (primary) hypertension: Secondary | ICD-10-CM

## 2018-01-14 DIAGNOSIS — Z952 Presence of prosthetic heart valve: Secondary | ICD-10-CM | POA: Diagnosis not present

## 2018-01-14 DIAGNOSIS — R531 Weakness: Secondary | ICD-10-CM

## 2018-01-14 DIAGNOSIS — J449 Chronic obstructive pulmonary disease, unspecified: Secondary | ICD-10-CM | POA: Diagnosis not present

## 2018-01-14 DIAGNOSIS — I5032 Chronic diastolic (congestive) heart failure: Secondary | ICD-10-CM | POA: Diagnosis not present

## 2018-01-14 DIAGNOSIS — E785 Hyperlipidemia, unspecified: Secondary | ICD-10-CM

## 2018-01-14 DIAGNOSIS — R911 Solitary pulmonary nodule: Secondary | ICD-10-CM

## 2018-01-14 MED ORDER — AMLODIPINE BESYLATE 5 MG PO TABS: 5.0000 mg | ORAL_TABLET | Freq: Every day | ORAL | 3 refills | Status: DC

## 2018-01-14 MED ORDER — AMOXICILLIN 500 MG PO TABS: ORAL_TABLET | ORAL | 3 refills | Status: DC

## 2018-01-14 NOTE — Patient Instructions (Addendum)
Medication Instructions:  1) Your physician discussed the importance of taking an antibiotic prior to any dental visits to prevent damage to the heart valves from infection. You were given a prescription for AMOXIL 2,000mg  to take 1 hour prior to dental visits.  Labwork: TODAY: BMET, TSH, CBC  Testing/Procedures: No new orders   Follow-Up: Please keep your appointments on November 13. Please arrive to our office by 12:30PM for your echo and subsequent office visit.   Any Other Special Instructions Will Be Listed Below (If Applicable). You are cleared to drive from a cardiac standpoint!

## 2018-01-15 LAB — CBC WITH DIFFERENTIAL/PLATELET
BASOS: 1 %
Basophils Absolute: 0.1 10*3/uL (ref 0.0–0.2)
EOS (ABSOLUTE): 0.4 10*3/uL (ref 0.0–0.4)
EOS: 5 %
HEMOGLOBIN: 11.6 g/dL (ref 11.1–15.9)
Hematocrit: 33.8 % — ABNORMAL LOW (ref 34.0–46.6)
IMMATURE GRANULOCYTES: 0 %
Immature Grans (Abs): 0 10*3/uL (ref 0.0–0.1)
LYMPHS: 22 %
Lymphocytes Absolute: 2 10*3/uL (ref 0.7–3.1)
MCH: 30 pg (ref 26.6–33.0)
MCHC: 34.3 g/dL (ref 31.5–35.7)
MCV: 87 fL (ref 79–97)
MONOCYTES: 9 %
MONOS ABS: 0.8 10*3/uL (ref 0.1–0.9)
NEUTROS PCT: 63 %
Neutrophils Absolute: 5.6 10*3/uL (ref 1.4–7.0)
Platelets: 286 10*3/uL (ref 150–450)
RBC: 3.87 x10E6/uL (ref 3.77–5.28)
RDW: 12.5 % (ref 12.3–15.4)
WBC: 8.9 10*3/uL (ref 3.4–10.8)

## 2018-01-15 LAB — BASIC METABOLIC PANEL
BUN/Creatinine Ratio: 11 — ABNORMAL LOW (ref 12–28)
BUN: 10 mg/dL (ref 8–27)
CALCIUM: 9.5 mg/dL (ref 8.7–10.3)
CO2: 25 mmol/L (ref 20–29)
CREATININE: 0.92 mg/dL (ref 0.57–1.00)
Chloride: 98 mmol/L (ref 96–106)
GFR calc Af Amer: 74 mL/min/{1.73_m2} (ref >59)
GFR, EST NON AFRICAN AMERICAN: 64 mL/min/{1.73_m2} (ref >59)
GLUCOSE: 89 mg/dL (ref 65–99)
Potassium: 4.4 mmol/L (ref 3.5–5.2)
SODIUM: 139 mmol/L (ref 134–144)

## 2018-01-15 LAB — TSH: TSH: 1.62 u[IU]/mL (ref 0.450–4.500)

## 2018-01-27 ENCOUNTER — Other Ambulatory Visit: Payer: Self-pay | Admitting: Family Medicine

## 2018-01-27 DIAGNOSIS — R350 Frequency of micturition: Secondary | ICD-10-CM

## 2018-02-01 ENCOUNTER — Other Ambulatory Visit: Payer: Self-pay | Admitting: Cardiology

## 2018-02-03 NOTE — Progress Notes (Signed)
[facility]                                       Cardiology Office Note    Date:  02/04/2018   ID:  Alexandra [name], DOB 1950/11/19, MRN [id number]  PCP:  [facility]  Cardiologist: Dr. Ellyn Hack Dr. Angelena Form & Dr. Cyndia Bent (TAVR)  CC: 1 month s/p TAVR   History of Present Illness:  Alexandra [name] is a 67 y.o. female with a history of HTN, HLD,COPD, chronic diastolic CHF, pulmonary HTN, LBBB, ongoing tobacco abuseandsevereaortic stenosiss/p TAVR (12/30/17) who presents to clinic for follow up.  An echo in 10/2016 showed a mean aortic valve gradient of 31 mmHg with a dimensionless index of 0.24. She was seen in our office at that time by Dr. Prescott Gum who was seeing her for a right lower lobe nodular density which resolved on subsequent CT scan. She was not having any clear symptoms of congestive heart failure and was therefore referred back to cardiology for follow-up. Over the past several months she developed exertional fatigue and shortness of breath as well as two-pillow orthopnea. She has been followed by Dr. Aundra Dubin and was treated for volume overload with Lasix. She had a follow-up echocardiogram on 10/31/2017 showed normal LV function with severe AS with a mean valve gradient of 32 mmHg with a peak of 62 mmHg.  R/LHC 11/20/2017 showed nonobstructive coronary disease, mild pulmonary hypertension and low right heart pressures. Lasix was reduced.  She was evaluated by the multidisciplinary valve team and ultimately underwent successful TAVR with a59mm Edwards Sapien 3 THV via the TF approach on 12/30/17. Post operative echo showed EF 60%, normally functioning TAVR valve with mean gradient of 22 mm Hg and no PVL. Diltiazem was discontinued at discharge given sinus bradycardia.  She has done well since surgery. She had some weakness and fatigue after discharge. Follow up lab work was unremarkable. BP was  elevated and amlodipine 5mg  daily was added. Today she presents to clinic for follow up. She continues to have some ongoing weakness and fatigue. No chest pain. She still has some mild shortness of breath but overall it is improved since TAVR. No LE edema, orthopnea, PND, dizziness, syncope, or blood in stool or urine. Has very bad knee pain. She thinks she needs knee surgery and doesn't know if she can wait 6 months to get it repaired.    Past Medical History:  Diagnosis Date  . COPD (chronic obstructive pulmonary disease) ([place]) 05/22/2016  . Coronary artery disease, non-occlusive 11/28/2016   R&LHC: 65% distal LAD lesion -- likely not angiographically significant->medical management. Normal LVEF. Moderately elevated LVEDP. I aortic valve gradient in the Cath Lab, moderate aortic stenosis. This would suggest that the stenosis is on the moderate side of moderate to severe.  Severe pulmonary hypertension (likely mixed).  AVA 0.96 cm; P-P gradient ~ 30 mmHg, Mean ~24.5-27.5 (Moderate AS)   . GERD (gastroesophageal reflux disease)   . Heart murmur   . Hx of adenomatous colonic polyps 10/19/2015  . Hyperlipidemia   . Hypertension   . Moderate mitral regurgitation by prior echocardiography 10/2016   Echocardiogram revealed moderate mitral regurgitation  . Pulmonary hypertension associated Aortic Stenosis, Mitral Regurgitation & COPD    Peak PAP by Echo ~54 mmHg;  by Cath PAP/Mean 71/32 mmHg, 53 mmHg (  with PCWP & LVEDP ~24-26 mmHg); TPG ~26 mmHg  . Pulmonary nodules   . S/P TAVR (transcatheter aortic valve replacement)   . Severe aortic stenosis     Past Surgical History:  Procedure Laterality Date  . APPENDECTOMY    . BREAST CYST EXCISION Right 1970  . CARDIAC VALVE REPLACEMENT    . CATARACT EXTRACTION W/ INTRAOCULAR LENS IMPLANT Right   . CHOLECYSTECTOMY OPEN    . COLONOSCOPY  2003, 2017  . CTA Chest  09/2016   Thoracic Aortic Ca2+ w/o dilation.  Coronary Calcification noted. PA normal. Mild  Emphysematous changes w/ mild LLL scarring.    [name] FRACTURE SURGERY     2007 -right tib /fib fracture ----07/2009 right femur fx after a fall  . FRACTURE SURGERY Right 2011   Femur  . PERIPHERAL VASCULAR CATHETERIZATION N/A 08/09/2014   Procedure: Aortic Arch Angiography;  Surgeon: Serafina Mitchell, MD;  Location: [facility];  Service: Cardiovascular: Type 1 Arch. No significant stenosis, aneurysmal degeneration or dissection.  No luminal irregularity seen in R or L SubClavian, Brachial or Radial A. Chronic distal ulnar A occlusion [place].  [name] PERIPHERAL VASCULAR CATHETERIZATION Right 08/09/2014   Procedure: Upper Extremity Angiography;  Surgeon: Serafina Mitchell, MD;  Location: [place] [facility];  Service: Cardiovascular;  Laterality: Right;  . RIGHT/LEFT HEART CATH AND CORONARY ANGIOGRAPHY N/A 12/06/2016   Procedure: RIGHT/LEFT HEART CATH AND CORONARY ANGIOGRAPHY;  Surgeon: Leonie Man, MD;  [facility]: [facility]: Cor Angio: 65% dLAD (Med Rx). AVA 0.96 cm; P-P gradient ~ 30 mmHg, Mean ~24.5-27.5 (Mod AS); RHC #s: RAP 8 mmHg, RVP/EDP: 71/8/15 mmHg, PCWP: 21-24 mmHg, PAP/mean: 71/32/52 mmHg = Severe Mixed Pulmonary HTN; LVP/EDP 205/17/26 mmHg ; CO/CI by Fick: 4.35, 2.44 - mildly reduced  . RIGHT/LEFT HEART CATH AND CORONARY ANGIOGRAPHY N/A 11/20/2017   Procedure: RIGHT/LEFT HEART CATH AND CORONARY ANGIOGRAPHY;  Surgeon: Larey Dresser, MD;  Location: [facility];  Service: Cardiovascular;  Laterality: N/A;  . TEE WITHOUT CARDIOVERSION N/A 12/30/2017   Procedure: TRANSESOPHAGEAL ECHOCARDIOGRAM (TEE);  Surgeon: Burnell Blanks, MD;  Location: [facility];  Service: Open Heart Surgery;  Laterality: N/A;  . TRANSCATHETER AORTIC VALVE REPLACEMENT, TRANSFEMORAL  12/30/2017  . TRANSCATHETER AORTIC VALVE REPLACEMENT, TRANSFEMORAL N/A 12/30/2017   Procedure: TRANSCATHETER AORTIC VALVE REPLACEMENT, TRANSFEMORAL;  Surgeon: Burnell Blanks, MD;  Location: [facility];  Service: Open  Heart Surgery;  Laterality: N/A;  . TRANSTHORACIC ECHOCARDIOGRAM  10/30/2016   Mild Concentric LVH. EF 55-60%. No RWMA, Gr 2 DD. Mod-Severe AS (mean-peak Gradient 31 mmHG - 64 mmHg), Mod MR. Mod LA dilation, Mod PA HTN - peak pressure ~54 mmHg). = Progression of AS from 2014  . ULTRASOUND GUIDANCE FOR VASCULAR ACCESS  08/09/2014   Procedure: Ultrasound Guidance For Vascular Access;  Surgeon: Serafina Mitchell, MD;  Location: [facility];  Service: Cardiovascular;;    Current Medications: Outpatient Medications Prior to Visit  Medication Sig Dispense Refill  . amLODipine (NORVASC) 5 MG tablet Take 1 tablet (5 mg total) by mouth daily. 90 tablet 3  . amoxicillin (AMOXIL) 500 MG tablet Take 2,000 mg (four tablets) one hour prior to dental visits. 8 tablet 3  . aspirin 81 MG tablet Take 1 tablet (81 mg total) by mouth daily. 30 tablet   . Azelastine HCl 0.15 % SOLN Place 2 sprays into the nose 2 (two) times daily as needed (allergies). 30 mL PRN  . bisoprolol-hydrochlorothiazide (ZIAC) 10-6.25 MG  tablet Take 1 tablet by mouth daily. 90 tablet 0  . cetirizine (ZYRTEC) 10 MG tablet Take 1 tablet (10 mg total) by mouth at bedtime. 30 tablet PRN  . clopidogrel (PLAVIX) 75 MG tablet Take 1 tablet (75 mg total) by mouth daily with breakfast. 90 tablet 1  . fluticasone (FLOVENT HFA) 220 MCG/ACT inhaler Inhale 2 puffs into the lungs 2 (two) times daily.    . furosemide (LASIX) 20 MG tablet Take 20 mg by mouth 2 (two) times daily.    [name] losartan (COZAAR) 100 MG tablet Take 1 tablet (100 mg total) by mouth daily. 90 tablet 3  . montelukast (SINGULAIR) 10 MG tablet Take 1 tablet (10 mg total) by mouth at bedtime. 30 tablet 3  . oxybutynin (DITROPAN-XL) 10 MG 24 hr tablet TAKE 1 TABLET BY MOUTH AT BEDTIME 90 tablet 0  . oxyCODONE-acetaminophen (PERCOCET) 10-325 MG tablet Take 1 tablet by mouth 4 (four) times daily as needed for pain.     . potassium chloride SA (K-DUR,KLOR-CON) 20 MEQ tablet Take 1 tablet  (20 mEq total) by mouth daily. 30 tablet 6  . rosuvastatin (CRESTOR) 10 MG tablet Take 1 tablet (10 mg total) by mouth daily. 90 tablet 3  . Vitamin D, Ergocalciferol, (DRISDOL) 1.25 MG (50000 UT) CAPS capsule Take 50,000 Units by mouth every 7 (seven) days.    . fluticasone (FLOVENT HFA) 220 MCG/ACT inhaler Inhale 2 puffs into the lungs 2 (two) times daily. (Patient not taking: Reported on 02/04/2018) 1 Inhaler 12  . furosemide (LASIX) 20 MG tablet Take 2 tabs in AM (Patient not taking: Reported on 02/04/2018) 90 tablet 3  . Vitamin D, Ergocalciferol, (DRISDOL) 50000 units CAPS capsule Take 1 capsule (50,000 Units total) by mouth every 7 (seven) days. (Patient not taking: Reported on 02/04/2018) 30 capsule 1   No facility-administered medications prior to visit.      Allergies:   Codeine and Symproic [naldemedine]   Social History   Socioeconomic History  . Marital status: Married    Spouse name: [name]  . Number of children: 3  . Years of education: 12th grade  . Highest education level: Not on file  Occupational History  . 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 Financial resource strain: Not on file  . Food insecurity:    Worry: Not on file    Inability: Not on file  . Transportation needs:    Medical: Not on file    Non-medical: Not on file  Tobacco Use  . Smoking status: Light Tobacco Smoker    Packs/day: 0.10    Years: 40.00    Pack years: 4.00    Types: Cigarettes  . Smokeless tobacco: Never Used  Substance and Sexual Activity  . Alcohol use: Not Currently    Alcohol/week: 0.0 standard drinks  . Drug use: Never    Comment: +cocaine in urine 12/19/2014; pt denies this hx on 12/30/2017  . Sexual activity: Not Currently    Partners: Male  Lifestyle  . Physical activity:    Days per week: Not on file    Minutes per session: Not on file  . Stress: Not on file  Relationships  .  Social connections:    Talks on phone: Not on file    Gets together: Not on file    Attends religious service: Not on file    Active member of club or organization: Not on  file    Attends meetings of clubs or organizations: Not on file    Relationship status: Not on file  Other Topics Concern  . Not on file  Social History Narrative   Lives with her husband and her middle son.   Her two older sons are products of a previous relationship.   Her youngest son is from her current marriage.   Oldest and youngest sons live nearby.   She has total of 3 children and 6 grandchildren with 2 great-grandchildren.   She does not exercise because she has pain in her right leg.     Family History:  The patient's family history includes Heart attack in her brother; Heart disease in her father and mother; Hypertension in her mother.      ROS:   Please see the history of present illness.    ROS All other systems reviewed and are negative.   PHYSICAL EXAM:   VS:  BP (!) 142/78   Pulse 61   Ht 5\' 6"  (1.676 m)   Wt 151 lb (68.5 kg)   SpO2 97%   BMI 24.37 kg/m    GEN: Well nourished, well developed, in no acute distress HEENT: normal Neck: no JVD or masses Cardiac: RRR; 2/6 SEM. No rubs, or gallops,no edema  Respiratory:  clear to auscultation bilaterally, normal work of breathing GI: soft, nontender, nondistended, + BS MS: no deformity or atrophy Skin: warm and dry, no rash Neuro:  Alert and Oriented x 3, Strength and sensation are intact Psych: euthymic mood, full affect   Wt Readings from Last 3 Encounters:  02/04/18 151 lb (68.5 kg)  01/14/18 148 lb (67.1 kg)  12/31/17 151 lb 3.8 oz (68.6 kg)      Studies/Labs Reviewed:   EKG:  EKG is NOT ordered today.    Recent Labs: 12/29/2017: ALT 9; B Natriuretic Peptide 1,354.2 12/31/2017: Magnesium 1.9 01/14/2018: BUN 10; Creatinine, Ser 0.92; Hemoglobin 11.6; Platelets 286; Potassium 4.4; Sodium 139; TSH 1.620   Lipid Panel      Component Value Date/Time   CHOL 234 (H) 06/17/2017 1116   TRIG 147 06/17/2017 1116   HDL 39 (L) 06/17/2017 1116   CHOLHDL 6.0 (H) 06/17/2017 1116   CHOLHDL 3.6 02/20/2016 1156   VLDL 19 02/20/2016 1156   LDLCALC 166 (H) 06/17/2017 1116    Additional studies/ records that were reviewed today include:   TAVR OPERATIVE NOTE   Date of Procedure:12/30/2017  Preoperative Diagnosis:Severe Aortic Stenosis   Postoperative Diagnosis:Same   Procedure:   Transcatheter Aortic Valve Replacement - Transfemoral Approach Edwards Sapien 3 THV (size 23mm, model # F048547, serial #[id number])  Co-Surgeons:Christopher Angelena Form, MD and Gaye Pollack, MD   Pre-operative Echo Findings: ? Severe aortic stenosis ? Normalleft ventricular systolic function  Post-operative Echo Findings: ? Mildparavalvular leak ? Normalleft ventricular systolic function  ____________________  Echo 12/31/17 Study Conclusions - Left ventricle: The cavity size was normal. There was moderate   concentric hypertrophy. Systolic function was normal. The   estimated ejection fraction was in the range of 60% to 65%. Wall   motion was normal; there were no regional wall motion   abnormalities. There was an increased relative contribution of   atrial contraction to ventricular filling. Doppler parameters are   consistent with abnormal left ventricular relaxation (grade 1   diastolic dysfunction). - Aortic valve: S/P 83mm Edwards Sapien AV bioprosthesis. There is   no perivalvular AI. Increased gradient across prosthesis with   peak velocity 334  c/s and mean AVG 104mmHg. Valve area (VTI): 1.1   cm^2. Valve area (Vmax): 0.96 cm^2. Valve area (Vmean): 1.1 cm^2. - Mitral valve: Calcified annulus. There was mild regurgitation.   Valve area by pressure half-time: 2.18 cm^2. - Atrial septum: There was increased thickness of the septum,    consistent with lipomatous hypertrophy. - Tricuspid valve: There was mild regurgitation. - Pulmonic valve: There was trivial regurgitation.  _______________  Echo 02/04/18 (1 month s/p TAVR) Study Conclusions - Left ventricle: The cavity size was normal. There was mild focal   basal hypertrophy of the septum. Systolic function was normal.   The estimated ejection fraction was in the range of 55% to 60%.   Wall motion was normal; there were no regional wall motion   abnormalities. Doppler parameters are consistent with abnormal   left ventricular relaxation (grade 1 diastolic dysfunction). - Aortic valve: A prosthesis was present and functioning normally.   The prosthesis had a normal range of motion. The sewing ring   appeared normal, had no rocking motion, and showed no evidence of   dehiscence. There was mild stenosis. Mean gradient (S): 19 mm Hg.   Peak gradient (S): 36 mm Hg. - Mitral valve: Calcified annulus. There was mild to moderate   regurgitation. - Left atrium: The atrium was severely dilated. - Right ventricle: Systolic function was normal. - Atrial septum: No defect or patent foramen ovale was identified. - Tricuspid valve: There was trivial regurgitation. - Pulmonic valve: There was no significant regurgitation. - Pulmonary arteries: PA peak pressure: 33 mm Hg (S). Impressions: - S/P TAVR. Valve mean gradient has gone from 7 (procedure)->25->19   mmHg and peak gradient from 16 (procedure) ->45 -> 36 mmHg.   Otherwise study largely unchanged from prior.   ASSESSMENT & PLAN:   Severe AS s/p TAVR: echo today shows EF 55-60%, normally functioning TAVR with elevated mean gradient of 19 mm Hg (but decreased from POD1 echo with mean of 22 mm Hg) and no PVL. She has NYHA class II symptoms, mostly of fatigue. SBE prophylaxis discussed; she has amoxicillin.  Plavix can be discontinued after 6 months of therapy (06/2018)  HTN: BP much better controlled after adding amlodipine    Chronic diastolic LOV:FIEPPIR euvolemic.   COPD: stable.   Pulmonary nodules: multiple small pulmonary nodules noted throughout the lungs bilaterally, largest of which measures 5 mm in the right lower lobe.seen on pre op TAVR scans. She has been followed for a right pulmonary nodule in the past that apparently resolved on its own. Radiology has recommended follow up with a non contrast CT at 12 months if high risk (she is a long time, heavy smoker.) I will have this set up for 11/2018  Leg rash: on side of valve deployment. Likely atheroemboli from the procedure. This has resolved.   Knee Pain: she will talk to her orthopedic doctor. She is not sure she can hold off on surgery for 6 months due to severe knee pain. We discussed how we would like her to get through at least 3 months if possible   Medication Adjustments/Labs and Tests Ordered: Current medicines are reviewed at length with the patient today.  Concerns regarding medicines are outlined above.  Medication changes, Labs and Tests ordered today are listed in the Patient Instructions below. Patient Instructions  Medication Instructions:  1) STOP PLAVIX July 02, 2018  Labwork: None  Testing/Procedures: Joellen Jersey recommends you have a CHEST CT in September, 2020.  Follow-Up: Your provider recommends  that you schedule a follow-up appointment in 3 MONTHS with Dr. Ellyn Hack.  You will be called to arrange your 1 year TAVR echocardiogram and office visit!     Signed, Angelena Form, PA-C  02/04/2018 6:32 PM    [facility] [address], [place], [place]  [address] Phone: [phone]; Fax: [phone]

## 2018-02-04 ENCOUNTER — Ambulatory Visit (HOSPITAL_COMMUNITY): Payer: Medicare HMO | Attending: Cardiology

## 2018-02-04 ENCOUNTER — Other Ambulatory Visit: Payer: Self-pay

## 2018-02-04 ENCOUNTER — Other Ambulatory Visit: Payer: Self-pay | Admitting: Physician Assistant

## 2018-02-04 ENCOUNTER — Encounter: Payer: Self-pay | Admitting: Physician Assistant

## 2018-02-04 ENCOUNTER — Ambulatory Visit (INDEPENDENT_AMBULATORY_CARE_PROVIDER_SITE_OTHER): Payer: Medicare HMO | Admitting: Physician Assistant

## 2018-02-04 VITALS — BP 142/78 | HR 61 | Ht 66.0 in | Wt 151.0 lb

## 2018-02-04 DIAGNOSIS — I5032 Chronic diastolic (congestive) heart failure: Secondary | ICD-10-CM | POA: Diagnosis not present

## 2018-02-04 DIAGNOSIS — Z952 Presence of prosthetic heart valve: Secondary | ICD-10-CM

## 2018-02-04 DIAGNOSIS — J449 Chronic obstructive pulmonary disease, unspecified: Secondary | ICD-10-CM | POA: Diagnosis not present

## 2018-02-04 DIAGNOSIS — I10 Essential (primary) hypertension: Secondary | ICD-10-CM

## 2018-02-04 DIAGNOSIS — R911 Solitary pulmonary nodule: Secondary | ICD-10-CM

## 2018-02-04 DIAGNOSIS — R531 Weakness: Secondary | ICD-10-CM

## 2018-02-04 NOTE — Patient Instructions (Addendum)
Medication Instructions:  1) STOP PLAVIX July 02, 2018  Labwork: None  Testing/Procedures: Alexandra [name] recommends you have a CHEST CT in September, 2020.  Follow-Up: Your provider recommends that you schedule a follow-up appointment in 3 MONTHS with Dr. Ellyn Hack.  You will be called to arrange your 1 year TAVR echocardiogram and office visit!

## 2018-02-09 ENCOUNTER — Telehealth (HOSPITAL_COMMUNITY): Payer: Self-pay

## 2018-02-09 MED ORDER — BISOPROLOL-HYDROCHLOROTHIAZIDE 10-6.25 MG PO TABS: 1.0000 | ORAL_TABLET | Freq: Every day | ORAL | 0 refills | Status: DC

## 2018-02-09 NOTE — Telephone Encounter (Signed)
Refill

## 2018-02-26 ENCOUNTER — Encounter: Payer: Self-pay | Admitting: Thoracic Surgery (Cardiothoracic Vascular Surgery)

## 2018-03-05 ENCOUNTER — Ambulatory Visit (HOSPITAL_COMMUNITY)
Admission: RE | Admit: 2018-03-05 | Discharge: 2018-03-05 | Disposition: A | Discharge status: Home / Self Care | Payer: Medicare HMO | Source: Ambulatory Visit | Attending: Cardiology | Admitting: Cardiology

## 2018-03-05 ENCOUNTER — Encounter (HOSPITAL_COMMUNITY): Payer: Self-pay | Admitting: Cardiology

## 2018-03-05 VITALS — BP 160/60 | HR 72 | Wt 152.8 lb

## 2018-03-05 DIAGNOSIS — I110 Hypertensive heart disease with heart failure: Secondary | ICD-10-CM | POA: Insufficient documentation

## 2018-03-05 DIAGNOSIS — Z7902 Long term (current) use of antithrombotics/antiplatelets: Secondary | ICD-10-CM | POA: Diagnosis not present

## 2018-03-05 DIAGNOSIS — I2721 Secondary pulmonary arterial hypertension: Secondary | ICD-10-CM | POA: Diagnosis not present

## 2018-03-05 DIAGNOSIS — M1712 Unilateral primary osteoarthritis, left knee: Secondary | ICD-10-CM | POA: Diagnosis not present

## 2018-03-05 DIAGNOSIS — J449 Chronic obstructive pulmonary disease, unspecified: Secondary | ICD-10-CM | POA: Diagnosis not present

## 2018-03-05 DIAGNOSIS — Z79899 Other long term (current) drug therapy: Secondary | ICD-10-CM | POA: Insufficient documentation

## 2018-03-05 DIAGNOSIS — Z7982 Long term (current) use of aspirin: Secondary | ICD-10-CM | POA: Insufficient documentation

## 2018-03-05 DIAGNOSIS — F17210 Nicotine dependence, cigarettes, uncomplicated: Secondary | ICD-10-CM | POA: Insufficient documentation

## 2018-03-05 DIAGNOSIS — I2510 Atherosclerotic heart disease of native coronary artery without angina pectoris: Secondary | ICD-10-CM

## 2018-03-05 DIAGNOSIS — I5032 Chronic diastolic (congestive) heart failure: Secondary | ICD-10-CM

## 2018-03-05 DIAGNOSIS — I350 Nonrheumatic aortic (valve) stenosis: Secondary | ICD-10-CM | POA: Diagnosis not present

## 2018-03-05 DIAGNOSIS — I509 Heart failure, unspecified: Secondary | ICD-10-CM

## 2018-03-05 DIAGNOSIS — E785 Hyperlipidemia, unspecified: Secondary | ICD-10-CM | POA: Diagnosis not present

## 2018-03-05 DIAGNOSIS — Z952 Presence of prosthetic heart valve: Secondary | ICD-10-CM

## 2018-03-05 DIAGNOSIS — I272 Pulmonary hypertension, unspecified: Secondary | ICD-10-CM

## 2018-03-05 LAB — LIPID PANEL
CHOLESTEROL: 133 mg/dL (ref 0–200)
HDL: 41 mg/dL (ref >40)
LDL CALC: 70 mg/dL (ref 0–99)
Total CHOL/HDL Ratio: 3.2 RATIO
Triglycerides: 109 mg/dL (ref <150)
VLDL: 22 mg/dL (ref 0–40)

## 2018-03-05 LAB — BASIC METABOLIC PANEL
Anion gap: 10 (ref 5–15)
BUN: 8 mg/dL (ref 8–23)
CO2: 27 mmol/L (ref 22–32)
Calcium: 9.3 mg/dL (ref 8.9–10.3)
Chloride: 103 mmol/L (ref 98–111)
Creatinine, Ser: 1.06 mg/dL — ABNORMAL HIGH (ref 0.44–1.00)
GFR calc Af Amer: >60 mL/min (ref >60)
GFR calc non Af Amer: 54 mL/min — ABNORMAL LOW (ref >60)
GLUCOSE: 97 mg/dL (ref 70–99)
Potassium: 3.9 mmol/L (ref 3.5–5.1)
Sodium: 140 mmol/L (ref 135–145)

## 2018-03-05 MED ORDER — AMLODIPINE BESYLATE 10 MG PO TABS: 10.0000 mg | ORAL_TABLET | Freq: Every day | ORAL | 3 refills | Status: DC

## 2018-03-05 NOTE — Patient Instructions (Signed)
Labs done today  EKG done today  INCREASE Amlodipine 10mg  (1 tab) daily  Follow up with Advanced Practice Providers in 3 months

## 2018-03-06 NOTE — Progress Notes (Signed)
PCP: Joaquin Courts Cardiology: Dr. Ellyn Hack HF Cardiology: Dr. Aundra Dubin  67 y.o. with history of COPD, aortic stenosis, diastolic CHF/pulmonary hypertension was referred by Dr. Ellyn Hack for evaluation of pulmonary hypertension.  She is an active smoker, now down to 1-2 cigs/day (heavier in the past).  However, PFTs in 4/19 showed only mild obstruction but DLCO was severely decreased.  Echo in 8/18 showed EF 50-55% with moderate aortic stenosis.  RHC/LHC done 9/18 showed nonobstructive CAD, elevated PCWP and severe mixed pulmonary venous/pulmonary arterial hypertension.  Aortic stenosis appeared moderate at that time.    At initial appointment, she was volume overloaded, and I increased her Lasix.    Echo was done in 8/19 and reviewed.  EF was 55-60% but aortic stenosis appeared to have progressed to severe range.  RHC/LHC in 8/19 showed nonobstructive CAD, normal filling pressures, mild pulmonary hypertension. In 10/19, she had TAVR.   She returns today for followup of diastolic CHF.  Breathing is improved since TAVR but she still gets short of breath with exertion.  Weight is up 3 lbs.  She is still smoking, under a lot of stress at home so has not been able to cut back.  No dyspnea with her housework or walking in the grocery store.  She is short of breath still with long distances walking.  Her left knee is very painful, she will need left TKR.    ECG (personally reviewed): NSR, LBBB 138 msec  Labs (9/18): creatinine 0.73 Labs (3/19): LDL 166 Labs (7/19): K 3.7, creatinine 1.17, BNP 1919 Labs (8/19): K 4, creatinine 1.04 Labs (10/19): K 4.4, creatinine 0.92  PMH: 1. COPD: She is an active smoker.  - PFTs (4/19): FVC 78%, FEV1 78%, ratio 100%, TLC 87%, DLCO 50%. Mild obstruction but severely decreased DLCO.  2. HTN 3. Hyperlipidemia 4. H/o atrial tachycardia 5. Aortic stenosis: Moderate on 8/18 echo.  Moderate on 9/18 LHC.  - Severe AS on 8/19 echo.  - TAVR 10/19 with Berniece Pap THV  #23.  6. Chronic diastolic CHF with pulmonary hypertension:  - Echo (8/18): EF 50-55%, mild LVH, grade II diastolic dysfunction, PASP 54 mmHg, modrerate AS with mean gradient 31 mmHg.  - LHC/RHC (9/18): 60% dLAD, 40% ostial RCA, 40% OM3. Mean RA 8, PA 71/32 mean 52, mean PCWP 27, PVR 5.7 WU => mixed pulmonary venous and pulmonary arterial hypertension. CI 2.44.  - Echo (8/19): EF 55-60%, moderate LVH, severe AS with mean gradient 32 mmHg/peak 62 mmHg, AVA 0.8 cm^2.  - LHC/RHC (8/19): 40% dLAD, 30% pRCA, mean RA 4, PA 39/8, mean PCWP 5, CI 2.45, PVR 3.78 WU.  - Echo (11/19): EF 55-60%, mild FBSH, s/p TAVR with mean gradient 19 mmHg, mild-moderate MR.   7. LBBB 8. OA: Left knee.   SH: Retired, lives with husband, smokes 1-2 cigs/day (heavier in the past).   FH: Parents with CAD (MIs), brother with MI at 51.    ROS: All systems reviewed and negative except as per HPI  Current Outpatient Medications  Medication Sig Dispense Refill  . amLODipine (NORVASC) 10 MG tablet Take 1 tablet (10 mg total) by mouth daily. 90 tablet 3  . aspirin 81 MG tablet Take 1 tablet (81 mg total) by mouth daily. 30 tablet   . bisoprolol-hydrochlorothiazide (ZIAC) 10-6.25 MG tablet Take 1 tablet by mouth daily. 90 tablet 0  . cetirizine (ZYRTEC) 10 MG tablet Take 1 tablet (10 mg total) by mouth at bedtime. 30 tablet PRN  . clopidogrel (PLAVIX)  75 MG tablet Take 1 tablet (75 mg total) by mouth daily with breakfast. 90 tablet 1  . furosemide (LASIX) 20 MG tablet Take 20 mg by mouth 2 (two) times daily.    [name] losartan (COZAAR) 100 MG tablet Take 1 tablet (100 mg total) by mouth daily. 90 tablet 3  . montelukast (SINGULAIR) 10 MG tablet Take 1 tablet (10 mg total) by mouth at bedtime. 30 tablet 3  . oxybutynin (DITROPAN-XL) 10 MG 24 hr tablet TAKE 1 TABLET BY MOUTH AT BEDTIME 90 tablet 0  . oxyCODONE-acetaminophen (PERCOCET) 10-325 MG tablet Take 1 tablet by mouth 4 (four) times daily as needed for pain.     . potassium  chloride SA (K-DUR,KLOR-CON) 20 MEQ tablet Take 1 tablet (20 mEq total) by mouth daily. 30 tablet 6  . rosuvastatin (CRESTOR) 10 MG tablet Take 1 tablet (10 mg total) by mouth daily. 90 tablet 3  . Vitamin D, Ergocalciferol, (DRISDOL) 1.25 MG (50000 UT) CAPS capsule Take 50,000 Units by mouth every 7 (seven) days.    [name] amoxicillin (AMOXIL) 500 MG tablet Take 2,000 mg (four tablets) one hour prior to dental visits. (Patient not taking: Reported on 03/05/2018) 8 tablet 3  . Azelastine HCl 0.15 % SOLN Place 2 sprays into the nose 2 (two) times daily as needed (allergies). (Patient not taking: Reported on 03/05/2018) 30 mL PRN  . fluticasone (FLOVENT HFA) 220 MCG/ACT inhaler Inhale 2 puffs into the lungs 2 (two) times daily.     No current facility-administered medications for this encounter.    BP (!) 160/60   Pulse 72   Wt 69.3 kg (152 lb 12.8 oz)   SpO2 98%   BMI 24.66 kg/m  General: NAD Neck: No JVD, no thyromegaly or thyroid nodule.  Lungs: Clear to auscultation bilaterally with normal respiratory effort. CV: Nondisplaced PMI.  Heart regular S1/S2, no S3/S4, 2/6 early SEM RUSB.  No peripheral edema.  No carotid bruit.  Normal pedal pulses.  Abdomen: Soft, nontender, no hepatosplenomegaly, no distention.  Skin: Intact without lesions or rashes.  Neurologic: Alert and oriented x 3.  Psych: Normal affect. Extremities: No clubbing or cyanosis.  HEENT: Normal.   Assessment/Plan: 1. CAD: Nonobstructive on 8/19 cath.  - Continue ASA 81.  - Continue Crestor, check lipids today.   2. COPD: Active smoker, few cigarettes/day.  PFTs showed only mild obstruction but DLCO was severely decreased.  - I again urged her to quit smoking.  She is not ready to stop yet given stress at home.  3. Chronic diastolic CHF with pulmonary hypertension: Echo in 11/19 with EF 55-60%, mild focal basal septal hypertrophy.  She is not volume overloaded on exam.  NYHA class II symptoms, improved since TAVR.  I think  that COPD plays a significant role in her residual exertional dyspnea.  - Continue Lasix 20 mg bid.  BMET today.   4. Aortic stenosis: s/p TAVR.  11/19 echo showed mean aortic valve gradient 19 mmHg, mildly elevated.  This is down from prior echo with mean gradient 25 mmHg.  - Continue Plavix x 6 months post-TAVR with ASA, after 6 months continue ASA 81 daily.  - She will need antibiotic prophylaxis with dental work.  5. Pulmonary hypertension: Mixed pulmonary arterial and pulmonary venous hypertension on RHC in 9/18.  PH was severe.  Given COPD, would be concerned for group 3 PH, but PFTs did not show severe COPD.  Therefore, group 1 component to [facility] still remains a concern. Repeat  RHC after diuresis in 8/19 showed only mild pulmonary hypertension, PVR 3.78 WU.  - No treatment with pulmonary vasodilators at this time.  6. HTN: BP elevated today.  - Increase amlodipine to 10 mg daily.  7. OA: Left knee severe OA.  She will need TKR eventually.   - Hopefully, she can wait to 6 months post-TAVR, will be stopping Plavix at that point.  8. Pulmonary nodules: Repeat CT chest in 9/20 for nodule assessment.    Followup in 3 months with PA/NP.   Loralie Champagne 03/06/2018

## 2018-03-06 NOTE — Addendum Note (Signed)
Encounter addended by: Larey Dresser, MD on: 03/06/2018 12:14 AM  Actions taken: Clinical Note Signed, Visit diagnoses modified, LOS modified

## 2018-04-28 ENCOUNTER — Other Ambulatory Visit: Payer: Self-pay | Admitting: Physician Assistant

## 2018-04-28 DIAGNOSIS — Z1231 Encounter for screening mammogram for malignant neoplasm of breast: Secondary | ICD-10-CM

## 2018-04-28 DIAGNOSIS — E2839 Other primary ovarian failure: Secondary | ICD-10-CM

## 2018-05-10 ENCOUNTER — Other Ambulatory Visit: Payer: Self-pay | Admitting: Family Medicine

## 2018-05-10 DIAGNOSIS — R350 Frequency of micturition: Secondary | ICD-10-CM

## 2018-05-11 ENCOUNTER — Other Ambulatory Visit (HOSPITAL_COMMUNITY): Payer: Self-pay | Admitting: Cardiology

## 2018-05-11 ENCOUNTER — Ambulatory Visit (INDEPENDENT_AMBULATORY_CARE_PROVIDER_SITE_OTHER): Payer: Medicare Other | Admitting: Cardiology

## 2018-05-11 ENCOUNTER — Encounter: Payer: Self-pay | Admitting: Cardiology

## 2018-05-11 VITALS — BP 130/58 | HR 62 | Ht 66.0 in | Wt 154.0 lb

## 2018-05-11 DIAGNOSIS — F17200 Nicotine dependence, unspecified, uncomplicated: Secondary | ICD-10-CM

## 2018-05-11 DIAGNOSIS — I272 Pulmonary hypertension, unspecified: Secondary | ICD-10-CM

## 2018-05-11 DIAGNOSIS — I350 Nonrheumatic aortic (valve) stenosis: Secondary | ICD-10-CM

## 2018-05-11 DIAGNOSIS — Z952 Presence of prosthetic heart valve: Secondary | ICD-10-CM

## 2018-05-11 DIAGNOSIS — I2510 Atherosclerotic heart disease of native coronary artery without angina pectoris: Secondary | ICD-10-CM | POA: Diagnosis not present

## 2018-05-11 DIAGNOSIS — I5032 Chronic diastolic (congestive) heart failure: Secondary | ICD-10-CM

## 2018-05-11 DIAGNOSIS — I10 Essential (primary) hypertension: Secondary | ICD-10-CM

## 2018-05-11 DIAGNOSIS — Z01810 Encounter for preprocedural cardiovascular examination: Secondary | ICD-10-CM

## 2018-05-11 DIAGNOSIS — E7849 Other hyperlipidemia: Secondary | ICD-10-CM

## 2018-05-11 NOTE — Progress Notes (Signed)
PCP: [facility]; [place], [place] Cardiology: Dr. Ellyn Hack HF Cardiology: Dr. Aundra Dubin TAVR Team   Clinic Note: Chief Complaint  Patient presents with  . Follow-up    First follow-up since her TAVR  . Congestive Heart Failure    Diastolic heart failure with pulmonary hypertension  . Aortic Stenosis    Status post TAVR    HPI: Alexandra [name] is a 68 y.o. female with a PMH notable for severe COPD and aortic stenosis status post TAVR with chronic diastolic CHF and pulmonary hypertension.  She has been followed by both the TAVR clinic and by Dr. Aundra Dubin from the heart failure clinic.  She now presents for routine follow-up with me.  Severe AS - S/p TAVR 12/30/2017 (60mm Edwards Sapien 3 THV via the TF approach) Chronic HFpEF Pulm Htn.  Alexandra [name] was last seen by me in March 2019.  She was having a lot of congestion and coughing.  Started to get little sick.  No PND orthopnea.  I have just referred her to Dr. Aundra Dubin for evaluation of pulmonary hypertension.  Post TAVR Clinic 02/04/2018 --doing well since TAVR.  Some weakness and fatigue post discharge.  Amlodipine 5 mg added for hypertension.  Notably overall improvement of dyspnea since TAVR.  No edema orthopnea or PND.  See by Dr. Aundra Dubin - [facility] 03/05/2018; noted significant improved breathing since TAVR but still gets short of breath with exertion.  Still smoking 1 to 2 cigarettes a day.  Hard to cut Foley out because of stress.  Walking limited by severe pain of left knee.  Needs total knee replacement.  Increased Amlodipine to 10 mg.   Recent Hospitalizations: None since TAVR on 12/30/2017  Studies Personally Reviewed - (if available, images/films reviewed: From Epic Chart or Care Everywhere)  Pre-TAVR Echo October 31, 2017: EF 55 to 60%.  GR 1 DD.  Calcific severe aortic stenosis.  Peak gradient 62 million mercury, mean 32 mmHg.  Progression of disease.  AVA estimated 0.8 cm.  Pre-TAVR  R&LHC: Low filling pressures with mild pulmonary pretension (status post diuresis) suspect group 3 PAH due to COPD.  PVR only 3.78 Wood units.  Nonobstructive CAD.  Valve not crossed.  Plan to proceed with TAVR.  Pre-/post TAVR TEE 12/30/2017: Post TAVR good position of 23 mm AP and 3 valve.  No new or WMA.  EF 55-60%.  No effusion.  Mild perivalvular regurgitation seen.  Mean gradient 7 mmHg.  Peak 16 million mercury.  Echo 12/31/2017: EF 60-65%.  GR 1 DD.  Stable appearing 23 mm Edwards's Sapien AV bioprosthesis.  No perivalvular AI.  Mean gradient 22 mmHg.  Echo 02/04/2018: Ef 55-60%. Gr1 DD. AoV mean gradient 19 mmHg, Peak 36 mmHg. PAP ~33 mmHg.  Interval History: Alexandra [name] presents here today overall doing much better from a cardiac standpoint.  She says her breathing has improved (although she does not say that it is as much improved as it was indicated in the previous notes).  She says she is a can occasionally feel her heart beating hard with some irregular heartbeats off and on, but has not had any chest pain or pressure besides an occasional sharp stabbing pains..  Definitely has noted exertional dyspnea improving.  Not having any more PND orthopnea. Unfortunately most of her activity is now limited by her left knee that is very painful.  She has a brace on in place and is hoping that she can have surgery done  in April when she is able to stop her Plavix.  She has intermittent skipping beats, but has not had significant rapid irregular heartbeats or palpitations.  No syncope or near syncope.  No TIA or amaurosis fugax.  No claudication.  Still smoking 1 to 2 cigarettes a day is really trying to quit, but still has a lot of stress at home. With her knee pain, not walking enough to notice any claudication.  ROS: A comprehensive was performed. Review of Systems  Constitutional: Negative for malaise/fatigue.  HENT: Negative for congestion and nosebleeds.   Respiratory: Positive for cough  (Notably improved) and shortness of breath (Improved from previous baseline).   Cardiovascular: Negative for leg swelling.  Gastrointestinal: Negative for blood in stool and melena.  Genitourinary: Negative for hematuria.  Musculoskeletal: Positive for joint pain (Severe left knee pain).  Neurological: Negative for dizziness and focal weakness.  Psychiatric/Behavioral: The patient is nervous/anxious.   All other systems reviewed and are negative.    I have reviewed and (if needed) personally updated the patient's problem list, medications, allergies, past medical and surgical history, social and family history.   Past Medical History:  Diagnosis Date  . COPD (chronic obstructive pulmonary disease) ([place]) 05/22/2016  . Coronary artery disease, non-occlusive 11/28/2016   R&LHC: 65% distal LAD lesion -- likely not angiographically significant->medical management. Normal LVEF. Moderately elevated LVEDP. I aortic valve gradient in the Cath Lab, moderate aortic stenosis. This would suggest that the stenosis is on the moderate side of moderate to severe.  Severe pulmonary hypertension (likely mixed).  AVA 0.96 cm; P-P gradient ~ 30 mmHg, Mean ~24.5-27.5 (Moderate AS)   . GERD (gastroesophageal reflux disease)   . Heart murmur   . Hx of adenomatous colonic polyps 10/19/2015  . Hyperlipidemia   . Hypertension   . Moderate mitral regurgitation by prior echocardiography 10/2016   Echocardiogram revealed moderate mitral regurgitation  . Pulmonary hypertension associated Aortic Stenosis, Mitral Regurgitation & COPD    Peak PAP by Echo ~54 mmHg;  by Cath PAP/Mean 71/32 mmHg, 53 mmHg (with PCWP & LVEDP ~24-26 mmHg); TPG ~26 mmHg  . Pulmonary nodules   . S/P TAVR (transcatheter aortic valve replacement)   . Severe aortic stenosis     Past Surgical History:  Procedure Laterality Date  . APPENDECTOMY    . BREAST CYST EXCISION Right 1970  . CARDIAC VALVE REPLACEMENT    . CATARACT EXTRACTION W/  INTRAOCULAR LENS IMPLANT Right   . CHOLECYSTECTOMY OPEN    . COLONOSCOPY  2003, 2017  . CTA Chest  09/2016   Thoracic Aortic Ca2+ w/o dilation.  Coronary Calcification noted. PA normal. Mild Emphysematous changes w/ mild LLL scarring.    [name] FRACTURE SURGERY     2007 -right tib /fib fracture ----07/2009 right femur fx after a fall  . FRACTURE SURGERY Right 2011   Femur  . PERIPHERAL VASCULAR CATHETERIZATION N/A 08/09/2014   Procedure: Aortic Arch Angiography;  Surgeon: Serafina Mitchell, MD;  Location: [place] [facility];  Service: Cardiovascular: Type 1 Arch. No significant stenosis, aneurysmal degeneration or dissection.  No luminal irregularity seen in R or L SubClavian, Brachial or Radial A. Chronic distal ulnar A occlusion [place].  [name] PERIPHERAL VASCULAR CATHETERIZATION Right 08/09/2014   Procedure: Upper Extremity Angiography;  Surgeon: Serafina Mitchell, MD;  Location: [place] [facility];  Service: Cardiovascular;  Laterality: Right;  . RIGHT/LEFT HEART CATH AND CORONARY ANGIOGRAPHY N/A 12/06/2016   Procedure: RIGHT/LEFT HEART CATH AND CORONARY ANGIOGRAPHY;  Surgeon: Leonie Man, MD;  Location: [facility] INVASIVE CV LAB: Cor Angio: 65% dLAD (Med Rx). AVA 0.96 cm; P-P gradient ~ 30 mmHg, Mean ~24.5-27.5 (Mod AS); RHC #s: RAP 8 mmHg, RVP/EDP: 71/8/15 mmHg, PCWP: 21-24 mmHg, PAP/mean: 71/32/52 mmHg = Severe Mixed Pulmonary HTN; LVP/EDP 205/17/26 mmHg ; CO/CI by Fick: 4.35, 2.44 - mildly reduced  . RIGHT/LEFT HEART CATH AND CORONARY ANGIOGRAPHY N/A 11/20/2017   Procedure: RIGHT/LEFT HEART CATH AND CORONARY ANGIOGRAPHY;  Surgeon: Larey Dresser, MD;  Location: [place] [facility];  Service: Cardiovascular;  Laterality: N/A;  . TEE WITHOUT CARDIOVERSION N/A 12/30/2017   Procedure: TRANSESOPHAGEAL ECHOCARDIOGRAM (TEE);  Surgeon: Burnell Blanks, MD;  Location: [place];  Service: Open Heart Surgery;  Laterality: N/A;  . TRANSCATHETER AORTIC VALVE REPLACEMENT, TRANSFEMORAL  12/30/2017  . TRANSCATHETER  AORTIC VALVE REPLACEMENT, TRANSFEMORAL N/A 12/30/2017   Procedure: TRANSCATHETER AORTIC VALVE REPLACEMENT, TRANSFEMORAL;  Surgeon: Burnell Blanks, MD;  Location: [place];  Service: Open Heart Surgery;  Laterality: N/A;  . TRANSTHORACIC ECHOCARDIOGRAM  10/30/2016   Mild Concentric LVH. EF 55-60%. No RWMA, Gr 2 DD. Mod-Severe AS (mean-peak Gradient 31 mmHG - 64 mmHg), Mod MR. Mod LA dilation, Mod PA HTN - peak pressure ~54 mmHg). = Progression of AS from 2014  . ULTRASOUND GUIDANCE FOR VASCULAR ACCESS  08/09/2014   Procedure: Ultrasound Guidance For Vascular Access;  Surgeon: Serafina Mitchell, MD;  Location: [place] [facility];  Service: Cardiovascular;;    Current Meds  Medication Sig  . amLODipine (NORVASC) 10 MG tablet Take 1 tablet (10 mg total) by mouth daily.  [name] aspirin 81 MG tablet Take 1 tablet (81 mg total) by mouth daily.  . clopidogrel (PLAVIX) 75 MG tablet Take 1 tablet (75 mg total) by mouth daily with breakfast.  . fluticasone (FLOVENT HFA) 220 MCG/ACT inhaler Inhale 2 puffs into the lungs 2 (two) times daily.  . furosemide (LASIX) 20 MG tablet Take 20 mg by mouth 2 (two) times daily.  [name] losartan (COZAAR) 100 MG tablet Take 1 tablet (100 mg total) by mouth daily.  . montelukast (SINGULAIR) 10 MG tablet Take 1 tablet (10 mg total) by mouth at bedtime.  [name] oxybutynin (DITROPAN-XL) 10 MG 24 hr tablet TAKE 1 TABLET BY MOUTH AT BEDTIME  . oxyCODONE-acetaminophen (PERCOCET) 10-325 MG tablet Take 1 tablet by mouth 4 (four) times daily as needed for pain.   . potassium chloride SA (K-DUR,KLOR-CON) 20 MEQ tablet Take 1 tablet (20 mEq total) by mouth daily.  . rosuvastatin (CRESTOR) 10 MG tablet Take 1 tablet (10 mg total) by mouth daily.  . Vitamin D, Ergocalciferol, (DRISDOL) 1.25 MG (50000 UT) CAPS capsule Take 50,000 Units by mouth every 7 (seven) days.  . [DISCONTINUED] bisoprolol-hydrochlorothiazide (ZIAC) 10-6.25 MG tablet Take 1 tablet by mouth daily.  . [DISCONTINUED] cetirizine  (ZYRTEC) 10 MG tablet Take 1 tablet (10 mg total) by mouth at bedtime.    Allergies  Allergen Reactions  . Codeine Nausea Only  . Symproic [Naldemedine] Nausea And Vomiting    Social History   Tobacco Use  . Smoking status: Light Tobacco Smoker    Packs/day: 0.10    Years: 40.00    Pack years: 4.00    Types: Cigarettes  . Smokeless tobacco: Never Used  Substance Use Topics  . Alcohol use: Not Currently    Alcohol/week: 0.0 standard drinks  . Drug use: Never    Comment: +cocaine in urine 12/19/2014; pt denies this hx on 12/30/2017  Social History   Social History Narrative   Lives with her husband and her middle son.   Her two older sons are products of a previous relationship.   Her youngest son is from her current marriage.   Oldest and youngest sons live nearby.   She has total of 3 children and 6 grandchildren with 2 great-grandchildren.   She does not exercise because she has pain in her right leg.    family history includes Heart attack in her brother; Heart disease in her father and mother; Hypertension in her mother.  Wt Readings from Last 3 Encounters:  05/11/18 154 lb (69.9 kg)  03/05/18 152 lb 12.8 oz (69.3 kg)  02/04/18 151 lb (68.5 kg)    PHYSICAL EXAM BP (!) 130/58   Pulse 62   Ht 5\' 6"  (1.676 m)   Wt 154 lb (69.9 kg)   BMI 24.86 kg/m  Physical Exam  Constitutional: She is oriented to person, place, and time. She appears well-developed and well-nourished. No distress.  Chronically ill-appearing.  Appears older than stated age but stable.  HENT:  Head: Normocephalic and atraumatic.  Neck: Normal range of motion. Neck supple. No JVD present.  Cardiovascular: Normal rate, regular rhythm and intact distal pulses.  No extrasystoles are present. PMI is not displaced. Exam reveals no gallop and no friction rub.  Murmur heard.  Medium-pitched harsh crescendo-decrescendo midsystolic murmur is present with a grade of 2/6 at the upper right sternal border  radiating to the neck. Pulmonary/Chest: Effort normal. No respiratory distress.  Mild diffuse coarse breath sounds, no obvious rales or rhonchi.  Nonlabored.  Abdominal: Soft. Bowel sounds are normal. She exhibits no distension. There is no abdominal tenderness. There is no rebound.  Musculoskeletal: Normal range of motion.        General: No edema.     Comments: Right knee and large brace.  Neurological: She is alert and oriented to person, place, and time.  Psychiatric: She has a normal mood and affect. Her behavior is normal. Judgment and thought content normal.  Vitals reviewed.    Adult ECG Report Not checked  Other studies Reviewed: Additional studies/ records that were reviewed today include:  Recent Labs:   Lab Results  Component Value Date   CHOL 133 03/05/2018   HDL 41 03/05/2018   LDLCALC 70 03/05/2018   TRIG 109 03/05/2018   CHOLHDL 3.2 03/05/2018   Lab Results  Component Value Date   CREATININE 1.06 (H) 03/05/2018   BUN 8 03/05/2018   NA 140 03/05/2018   K 3.9 03/05/2018   CL 103 03/05/2018   CO2 27 03/05/2018    ASSESSMENT / PLAN: Problem List Items Addressed This Visit    Chronic diastolic (congestive) heart failure (HCC) (Chronic)    No PND orthopnea.  Still has some exertional dyspnea.  He is on high-dose ARB valvular reduction along with amlodipine.  Not on beta-blocker because of pulmonary issues. He is on stable 40 mg daily of Lasix.  Also on replacement potassium.  Pretty stable post TAVR.      Coronary artery disease, non-occlusive (Chronic)   Essential hypertension (Chronic)    Blood pressure looks better now.  On amlodipine which also was present for pulmonary pretension.  On max dose losartan.      Hyperlipidemia    Lipids well controlled based on most recent labs.  Remains on low-dose rosuvastatin.      Preop cardiovascular exam    Wai is now status post TAVR  with notably improved aortic valve gradients.  This has notably improved her  diastolic dysfunction making her lower risk than she had previously been for any particular surgery.  She has nonobstructive CAD by cath.  At this point, she is on Plavix post TAVR for 6 months.  By mid April she should be okay holding Plavix 5 to 7 days preop.  She would not require any further cardiac evaluation as she is quite asymptomatic from a cardiac standpoint at this time.  More concerns would be based on her pulmonary issues.  She would be a low risk patient for a low risk operation.      Pulmonary hypertension: Combined secondary as well as lung disease - Primary (Chronic)    Pulmonary pretension was noted to be much improved with diuresis.  Has not been reassessed post TAVR, however based on only grade 1 diastolic dysfunction on echoes, I would suspect it is somewhat improved.      S/P TAVR (transcatheter aortic valve replacement) (Chronic)    Recovering well post TAVR.  Is now almost 6 months out.  Should be okay to hold Plavix by mid April for her surgery.  Notably improved exertional dyspnea.  Unfortunately were not able to see how much she is able to do because of the knee pain.      Severe aortic stenosis (Chronic)   Smoker (Chronic)     Needs Abx Prophylaxis  I spent a total of 20 minutes with the patient and chart review. >  50% of the time was spent in direct patient consultation.   Current medicines are reviewed at length with the patient today.  (+/- concerns) none The following changes have been made:  None  Patient Instructions  Medication Instructions:   CONTINUE TAKING ASPIRIN AND CLOPIDOGREL ( PLAVIX)  UNTIL April 9,2020.  IF YOU EXCESSIVE BRUISING  CAN HOLD ASPIRIN FOR 3 TO 4 DAYS.  MAY STOP TAKING PLAVIX(CLOPIDOGREL ) day after April 9,2020   If you need a refill on your cardiac medications before your next appointment, please call your pharmacy.   Lab work: NOT NEEDED If you have labs (blood work) drawn today and your tests are completely normal,  you will receive your results only by: [name] MyChart Message (if you have MyChart) OR . A paper copy in the mail If you have any lab test that is abnormal or we need to change your treatment, we will call you to review the results.  Testing/Procedures: NOT NEEDED  Follow-Up: At [facility], you and your health needs are our priority.  As part of our continuing mission to provide you with exceptional heart care, we have created designated Provider Care Teams.  These Care Teams include your primary Cardiologist (physician) and Advanced Practice Providers (APPs -  Physician Assistants and Nurse Practitioners) who all work together to provide you with the care you need, when you need it. You will need a follow up appointment in 12 months FEB 2020.  Please call our office 2 months in advance to schedule this appointment.  You may see Glenetta Hew, MD or one of the following Advanced Practice Providers on your designated Care Team:   Rosaria Ferries, PA-C . Jory Sims, DNP, ANP  Any Other Special Instructions Will Be Listed Below (If Applicable).    Studies Ordered:   No orders of the defined types were placed in this encounter.     Glenetta Hew, M.D., M.S. Interventional Cardiologist   Pager # [phone] Phone # [phone]  [address] [place], [place] [address]   Thank you for choosing Heartcare at [facility]!!

## 2018-05-11 NOTE — Patient Instructions (Signed)
Medication Instructions:   CONTINUE TAKING ASPIRIN AND CLOPIDOGREL ( PLAVIX)  UNTIL April 9,2020.  IF YOU EXCESSIVE BRUISING  CAN HOLD ASPIRIN FOR 3 TO 4 DAYS.  MAY STOP TAKING PLAVIX(CLOPIDOGREL ) day after April 9,2020   If you need a refill on your cardiac medications before your next appointment, please call your pharmacy.   Lab work: NOT NEEDED If you have labs (blood work) drawn today and your tests are completely normal, you will receive your results only by: [name] MyChart Message (if you have MyChart) OR . A paper copy in the mail If you have any lab test that is abnormal or we need to change your treatment, we will call you to review the results.  Testing/Procedures: NOT NEEDED  Follow-Up: At [facility], you and your health needs are our priority.  As part of our continuing mission to provide you with exceptional heart care, we have created designated Provider Care Teams.  These Care Teams include your primary Cardiologist (physician) and Advanced Practice Providers (APPs -  Physician Assistants and Nurse Practitioners) who all work together to provide you with the care you need, when you need it. You will need a follow up appointment in 12 months FEB 2020.  Please call our office 2 months in advance to schedule this appointment.  You may see Glenetta Hew, MD or one of the following Advanced Practice Providers on your designated Care Team:   Rosaria Ferries, PA-C . Jory Sims, DNP, ANP  Any Other Special Instructions Will Be Listed Below (If Applicable).

## 2018-05-11 NOTE — Telephone Encounter (Signed)
Requested medication (s) are due for refill today: yes  Requested medication (s) are on the active medication list: yes  Last refill:  01/27/18 for 90 tabs  Future visit scheduled: no  Notes to clinic:  LOV  07/02/17 with Doroteo Bradford  Requested Prescriptions  Pending Prescriptions Disp Refills   oxybutynin (DITROPAN-XL) 10 MG 24 hr tablet [Pharmacy Med Name: OXYBUTYNIN ER 10MG  TABLETS] 90 tablet 0    Sig: TAKE 1 TABLET BY MOUTH AT BEDTIME     Urology:  Bladder Agents Passed - 05/10/2018  6:01 PM      Passed - Valid encounter within last 12 months    Recent Outpatient Visits          10 months ago Essential hypertension   Primary Care at [facility], [place], [place]   1 year ago Anxiety state   Primary Care at [place], [place], [place]   1 year ago Chronic obstructive pulmonary disease, unspecified COPD type [facility])   Primary Care at [facility], [place], [place]   1 year ago Chronic obstructive pulmonary disease, unspecified COPD type ([place])   Primary Care at [facility], [place], [place]   1 year ago Chronic obstructive pulmonary disease, unspecified COPD type [facility])   Primary Care at [facility], [place], [place]

## 2018-05-13 ENCOUNTER — Encounter: Payer: Self-pay | Admitting: Cardiology

## 2018-05-13 DIAGNOSIS — Z01810 Encounter for preprocedural cardiovascular examination: Secondary | ICD-10-CM | POA: Insufficient documentation

## 2018-05-13 NOTE — Assessment & Plan Note (Signed)
No PND orthopnea.  Still has some exertional dyspnea.  He is on high-dose ARB valvular reduction along with amlodipine.  Not on beta-blocker because of pulmonary issues. He is on stable 40 mg daily of Lasix.  Also on replacement potassium.  Pretty stable post TAVR.

## 2018-05-13 NOTE — Assessment & Plan Note (Signed)
Lipids well controlled based on most recent labs.  Remains on low-dose rosuvastatin.

## 2018-05-13 NOTE — Assessment & Plan Note (Addendum)
Recovering well post TAVR.  Is now almost 6 months out.  Should be okay to hold Plavix by mid April for her surgery.  Notably improved exertional dyspnea.  Unfortunately were not able to see how much she is able to do because of the knee pain.

## 2018-05-13 NOTE — Assessment & Plan Note (Signed)
Blood pressure looks better now.  On amlodipine which also was present for pulmonary pretension.  On max dose losartan.

## 2018-05-13 NOTE — Assessment & Plan Note (Addendum)
Alexandra [name] is now status post TAVR with notably improved aortic valve gradients.  This has notably improved her diastolic dysfunction making her lower risk than she had previously been for any particular surgery.  She has nonobstructive CAD by cath.  At this point, she is on Plavix post TAVR for 6 months.  By mid April she should be okay holding Plavix 5 to 7 days preop.  She would not require any further cardiac evaluation as she is quite asymptomatic from a cardiac standpoint at this time.  More concerns would be based on her pulmonary issues.  She would be a low risk patient for a low risk operation.

## 2018-05-13 NOTE — Assessment & Plan Note (Signed)
Pulmonary pretension was noted to be much improved with diuresis.  Has not been reassessed post TAVR, however based on only grade 1 diastolic dysfunction on echoes, I would suspect it is somewhat improved.

## 2018-05-20 ENCOUNTER — Other Ambulatory Visit (HOSPITAL_COMMUNITY): Payer: Self-pay

## 2018-05-20 MED ORDER — POTASSIUM CHLORIDE CRYS ER 20 MEQ PO TBCR: 20.0000 meq | EXTENDED_RELEASE_TABLET | Freq: Every day | ORAL | 6 refills | Status: DC

## 2018-05-29 ENCOUNTER — Other Ambulatory Visit (HOSPITAL_COMMUNITY): Payer: Self-pay

## 2018-05-29 MED ORDER — FUROSEMIDE 20 MG PO TABS: 20.0000 mg | ORAL_TABLET | Freq: Two times a day (BID) | ORAL | 3 refills | Status: DC

## 2018-06-03 NOTE — Progress Notes (Signed)
PCP: Joaquin Courts Cardiology: Dr. Ellyn Hack HF Cardiology: Dr. Aundra Dubin  68 y.o. with history of COPD, aortic stenosis, diastolic CHF/pulmonary hypertension was referred by Dr. Ellyn Hack for evaluation of pulmonary hypertension.  She is an active smoker, now down to 1-2 cigs/day (heavier in the past).  However, PFTs in 4/19 showed only mild obstruction but DLCO was severely decreased.  Echo in 8/18 showed EF 50-55% with moderate aortic stenosis.  RHC/LHC done 9/18 showed nonobstructive CAD, elevated PCWP and severe mixed pulmonary venous/pulmonary arterial hypertension.  Aortic stenosis appeared moderate at that time.    At initial appointment, she was volume overloaded, and I increased her Lasix.    Echo was done in 8/19 and reviewed.  EF was 55-60% but aortic stenosis appeared to have progressed to severe range.  RHC/LHC in 8/19 showed nonobstructive CAD, normal filling pressures, mild pulmonary hypertension. In 10/19, she had TAVR.   She returns today for follow up. Last visit amlodipine was increased. Overall doing fine. Denies SOB. Occasional ankle edema. No orthopnea or PND. No CP or dizziness. Appetite is good. Great UOP with lasix. Taking all medications other than bisoprolol/HCTZ because she ran out of refills. Has been off for 1 month. Smokes 1 cigarette occasionally when stressed. Weight is up 3 lbs. Limits fluid and salt intake. Planning to stop plavix on 4/9. Hoping to get knee surgery shortly after that.   ECG (personally reviewed): NSR, LBBB 138 msec  Labs (9/18): creatinine 0.73 Labs (3/19): LDL 166 Labs (7/19): K 3.7, creatinine 1.17, BNP 1919 Labs (8/19): K 4, creatinine 1.04 Labs (10/19): K 4.4, creatinine 0.92  PMH: 1. COPD: She is an active smoker.  - PFTs (4/19): FVC 78%, FEV1 78%, ratio 100%, TLC 87%, DLCO 50%. Mild obstruction but severely decreased DLCO.  2. HTN 3. Hyperlipidemia 4. H/o atrial tachycardia 5. Aortic stenosis: Moderate on 8/18 echo.  Moderate on 9/18  LHC.  - Severe AS on 8/19 echo.  - TAVR 10/19 with Berniece Pap THV #23.  6. Chronic diastolic CHF with pulmonary hypertension:  - Echo (8/18): EF 50-55%, mild LVH, grade II diastolic dysfunction, PASP 54 mmHg, modrerate AS with mean gradient 31 mmHg.  - LHC/RHC (9/18): 60% dLAD, 40% ostial RCA, 40% OM3. Mean RA 8, PA 71/32 mean 52, mean PCWP 27, PVR 5.7 WU => mixed pulmonary venous and pulmonary arterial hypertension. CI 2.44.  - Echo (8/19): EF 55-60%, moderate LVH, severe AS with mean gradient 32 mmHg/peak 62 mmHg, AVA 0.8 cm^2.  - LHC/RHC (8/19): 40% dLAD, 30% pRCA, mean RA 4, PA 39/8, mean PCWP 5, CI 2.45, PVR 3.78 WU.  - Echo (11/19): EF 55-60%, mild FBSH, s/p TAVR with mean gradient 19 mmHg, mild-moderate MR.   7. LBBB 8. OA: Left knee.   SH: Retired, lives with husband, smokes 1-2 cigs/day (heavier in the past).   FH: Parents with CAD (MIs), brother with MI at 75.    Review of systems complete and found to be negative unless listed in HPI.   Current Outpatient Medications  Medication Sig Dispense Refill  . amLODipine (NORVASC) 10 MG tablet Take 1 tablet (10 mg total) by mouth daily. 90 tablet 3  . amoxicillin (AMOXIL) 500 MG tablet Take 2,000 mg (four tablets) one hour prior to dental visits. 8 tablet 3  . aspirin 81 MG tablet Take 1 tablet (81 mg total) by mouth daily. 30 tablet   . Azelastine HCl 0.15 % SOLN Place 2 sprays into the nose 2 (two) times daily as  needed (allergies). 30 mL PRN  . clopidogrel (PLAVIX) 75 MG tablet Take 1 tablet (75 mg total) by mouth daily with breakfast. 90 tablet 1  . fluticasone (FLOVENT HFA) 220 MCG/ACT inhaler Inhale 2 puffs into the lungs 2 (two) times daily.    . furosemide (LASIX) 20 MG tablet Take 1 tablet (20 mg total) by mouth 2 (two) times daily. 60 tablet 3  . losartan (COZAAR) 100 MG tablet Take 1 tablet (100 mg total) by mouth daily. 90 tablet 3  . montelukast (SINGULAIR) 10 MG tablet Take 1 tablet (10 mg total) by mouth at bedtime.  30 tablet 3  . oxybutynin (DITROPAN-XL) 10 MG 24 hr tablet TAKE 1 TABLET BY MOUTH AT BEDTIME 90 tablet 0  . oxyCODONE-acetaminophen (PERCOCET) 10-325 MG tablet Take 1 tablet by mouth 4 (four) times daily as needed for pain.     . potassium chloride SA (K-DUR,KLOR-CON) 20 MEQ tablet Take 1 tablet (20 mEq total) by mouth daily. 30 tablet 6  . rosuvastatin (CRESTOR) 10 MG tablet Take 1 tablet (10 mg total) by mouth daily. 90 tablet 3  . Vitamin D, Ergocalciferol, (DRISDOL) 1.25 MG (50000 UT) CAPS capsule Take 50,000 Units by mouth every 7 (seven) days.     No current facility-administered medications for this encounter.    BP 130/82   Pulse 85   Wt 70.6 kg (155 lb 9.6 oz)   SpO2 97%   BMI 25.11 kg/m  Wt Readings from Last 3 Encounters:  06/04/18 70.6 kg (155 lb 9.6 oz)  05/11/18 69.9 kg (154 lb)  03/05/18 69.3 kg (152 lb 12.8 oz)   General: No resp difficulty. HEENT: Normal Neck: Supple. JVP 5-6. Carotids 2+ bilat; no bruits. No thyromegaly or nodule noted. Cor: PMI nondisplaced. RRR, no murmurs Lungs: CTAB, normal effort. Abdomen: Soft, non-tender, non-distended, no HSM. No bruits or masses. +BS  Extremities: No cyanosis, clubbing, or rash. R and LLE no edema. Left knee brace present. Neuro: Alert & orientedx3, cranial nerves grossly intact. moves all 4 extremities w/o difficulty. Affect pleasant   Assessment/Plan: 1. CAD: Nonobstructive on 8/19 cath.  - Continue ASA 81.  - Continue Crestor. LDL 70 02/2018 2. COPD: Active smoker, few cigarettes/day.  PFTs showed only mild obstruction but DLCO was severely decreased.  - Again urged her to quit taking. She is smoking 1 cigarette occasionally when she is stressed. Encouraged complete cessation.  3. Chronic diastolic CHF with pulmonary hypertension: Echo in 11/19 with EF 55-60%, mild focal basal septal hypertrophy.  Volume stable.  NYHA class II symptoms, improved since TAVR.  Suspect that COPD plays a significant role in her residual  exertional dyspnea.  - Continue Lasix 20 mg bid.  BMET today.   4. Aortic stenosis: s/p TAVR.  11/19 echo showed mean aortic valve gradient 19 mmHg, mildly elevated.  This is down from prior echo with mean gradient 25 mmHg.  - Continue Plavix x 6 months post-TAVR with ASA, after 6 months continue ASA 81 daily. She drops plavix in April and plans to get knee surgery after that.  - She will need antibiotic prophylaxis with dental work.  5. Pulmonary hypertension: Mixed pulmonary arterial and pulmonary venous hypertension on RHC in 9/18.  PH was severe.  Given COPD, would be concerned for group 3 PH, but PFTs did not show severe COPD.  Therefore, group 1 component to [facility] still remains a concern. Repeat RHC after diuresis in 8/19 showed only mild pulmonary hypertension, PVR 3.78 WU.  -  No treatment with pulmonary vasodilators at this time. No change.  6. HTN:  - Continue amlodipine 10 mg daily.  7. OA: Left knee severe OA.  She will need TKR eventually.   - Hopefully, she can wait to 6 months post-TAVR, will be stopping Plavix at that point. No change.  8. Pulmonary nodules: Repeat CT chest in 9/20 for nodule assessment.    BMET today. Provided refill for bisoprolol/HCTZ. Follow up in 3 months.   Georgiana Shore, NP 06/04/2018  Greater than 50% of the 25 minute visit was spent in counseling/coordination of care regarding disease state education, salt/fluid restriction, sliding scale diuretics, and medication compliance.

## 2018-06-04 ENCOUNTER — Encounter (HOSPITAL_COMMUNITY): Payer: Self-pay

## 2018-06-04 ENCOUNTER — Other Ambulatory Visit: Payer: Self-pay

## 2018-06-04 ENCOUNTER — Ambulatory Visit (HOSPITAL_COMMUNITY)
Admission: RE | Admit: 2018-06-04 | Discharge: 2018-06-04 | Disposition: A | Discharge status: Home / Self Care | Payer: Medicare Other | Source: Ambulatory Visit | Attending: Internal Medicine | Admitting: Internal Medicine

## 2018-06-04 VITALS — BP 130/82 | HR 85 | Wt 155.6 lb

## 2018-06-04 DIAGNOSIS — Z8249 Family history of ischemic heart disease and other diseases of the circulatory system: Secondary | ICD-10-CM | POA: Insufficient documentation

## 2018-06-04 DIAGNOSIS — I447 Left bundle-branch block, unspecified: Secondary | ICD-10-CM | POA: Insufficient documentation

## 2018-06-04 DIAGNOSIS — I110 Hypertensive heart disease with heart failure: Secondary | ICD-10-CM | POA: Insufficient documentation

## 2018-06-04 DIAGNOSIS — E785 Hyperlipidemia, unspecified: Secondary | ICD-10-CM | POA: Diagnosis not present

## 2018-06-04 DIAGNOSIS — I10 Essential (primary) hypertension: Secondary | ICD-10-CM | POA: Diagnosis not present

## 2018-06-04 DIAGNOSIS — I2721 Secondary pulmonary arterial hypertension: Secondary | ICD-10-CM | POA: Diagnosis not present

## 2018-06-04 DIAGNOSIS — Z952 Presence of prosthetic heart valve: Secondary | ICD-10-CM

## 2018-06-04 DIAGNOSIS — Z7902 Long term (current) use of antithrombotics/antiplatelets: Secondary | ICD-10-CM | POA: Insufficient documentation

## 2018-06-04 DIAGNOSIS — M1712 Unilateral primary osteoarthritis, left knee: Secondary | ICD-10-CM | POA: Diagnosis not present

## 2018-06-04 DIAGNOSIS — Z7982 Long term (current) use of aspirin: Secondary | ICD-10-CM | POA: Insufficient documentation

## 2018-06-04 DIAGNOSIS — Z79899 Other long term (current) drug therapy: Secondary | ICD-10-CM | POA: Diagnosis not present

## 2018-06-04 DIAGNOSIS — I5032 Chronic diastolic (congestive) heart failure: Secondary | ICD-10-CM | POA: Insufficient documentation

## 2018-06-04 DIAGNOSIS — I272 Pulmonary hypertension, unspecified: Secondary | ICD-10-CM

## 2018-06-04 DIAGNOSIS — I2510 Atherosclerotic heart disease of native coronary artery without angina pectoris: Secondary | ICD-10-CM | POA: Insufficient documentation

## 2018-06-04 DIAGNOSIS — J449 Chronic obstructive pulmonary disease, unspecified: Secondary | ICD-10-CM | POA: Insufficient documentation

## 2018-06-04 DIAGNOSIS — I350 Nonrheumatic aortic (valve) stenosis: Secondary | ICD-10-CM | POA: Insufficient documentation

## 2018-06-04 DIAGNOSIS — F17210 Nicotine dependence, cigarettes, uncomplicated: Secondary | ICD-10-CM | POA: Diagnosis not present

## 2018-06-04 LAB — BASIC METABOLIC PANEL
Anion gap: 10 (ref 5–15)
BUN: 12 mg/dL (ref 8–23)
CHLORIDE: 104 mmol/L (ref 98–111)
CO2: 21 mmol/L — ABNORMAL LOW (ref 22–32)
Calcium: 9 mg/dL (ref 8.9–10.3)
Creatinine, Ser: 1.05 mg/dL — ABNORMAL HIGH (ref 0.44–1.00)
GFR calc Af Amer: >60 mL/min (ref >60)
GFR calc non Af Amer: 54 mL/min — ABNORMAL LOW (ref >60)
Glucose, Bld: 91 mg/dL (ref 70–99)
Potassium: 4.6 mmol/L (ref 3.5–5.1)
Sodium: 135 mmol/L (ref 135–145)

## 2018-06-04 MED ORDER — BISOPROLOL-HYDROCHLOROTHIAZIDE 10-6.25 MG PO TABS: 1.0000 | ORAL_TABLET | Freq: Every day | ORAL | 6 refills | Status: DC

## 2018-06-04 NOTE — Patient Instructions (Addendum)
RESTART Ziac 10/6.25 mg, one tab daily   Labs today We will only contact you if something comes back abnormal or we need to make some changes. Otherwise no news is good news!   Your physician recommends that you schedule a follow-up appointment in: 3 months with Dr Aundra Dubin   Do the following things EVERYDAY: 1) Weigh yourself in the morning before breakfast. Write it down and keep it in a log. 2) Take your medicines as prescribed 3) Eat low salt foods-Limit salt (sodium) to 2000 mg per day.  4) Stay as active as you can everyday 5) Limit all fluids for the day to less than 2 liters

## 2018-06-05 ENCOUNTER — Encounter: Payer: Self-pay | Admitting: Physical Therapy

## 2018-06-05 ENCOUNTER — Ambulatory Visit: Payer: Medicare Other | Attending: Family Medicine | Admitting: Physical Therapy

## 2018-06-05 DIAGNOSIS — R262 Difficulty in walking, not elsewhere classified: Secondary | ICD-10-CM | POA: Insufficient documentation

## 2018-06-05 DIAGNOSIS — M25562 Pain in left knee: Secondary | ICD-10-CM | POA: Insufficient documentation

## 2018-06-05 DIAGNOSIS — M25662 Stiffness of left knee, not elsewhere classified: Secondary | ICD-10-CM | POA: Insufficient documentation

## 2018-06-05 NOTE — Therapy (Signed)
[facility] [place], [place], [address] Phone: [phone]   Fax:  [phone]  Physical Therapy Evaluation  Patient Details  Name: Alexandra [name] MRN: [id number] Date of Birth: Nov 11, 1950 Referring Provider (PT): Rip Harbour   Encounter Date: 06/05/2018  PT End of Session - 06/05/18 1125    Visit Number  1    Date for PT Re-Evaluation  08/05/18    PT Start Time  1050    PT Stop Time  1120    PT Time Calculation (min)  30 min    Activity Tolerance  Patient limited by pain    Behavior During Therapy  [facility] for tasks assessed/performed       Past Medical History:  Diagnosis Date  . COPD (chronic obstructive pulmonary disease) ([place]) 05/22/2016  . Coronary artery disease, non-occlusive 11/28/2016   R&LHC: 65% distal LAD lesion -- likely not angiographically significant->medical management. Normal LVEF. Moderately elevated LVEDP. I aortic valve gradient in the Cath Lab, moderate aortic stenosis. This would suggest that the stenosis is on the moderate side of moderate to severe.  Severe pulmonary hypertension (likely mixed).  AVA 0.96 cm; P-P gradient ~ 30 mmHg, Mean ~24.5-27.5 (Moderate AS)   . GERD (gastroesophageal reflux disease)   . Heart murmur   . Hx of adenomatous colonic polyps 10/19/2015  . Hyperlipidemia   . Hypertension   . Moderate mitral regurgitation by prior echocardiography 10/2016   Echocardiogram revealed moderate mitral regurgitation  . Pulmonary hypertension associated Aortic Stenosis, Mitral Regurgitation & COPD    Peak PAP by Echo ~54 mmHg;  by Cath PAP/Mean 71/32 mmHg, 53 mmHg (with PCWP & LVEDP ~24-26 mmHg); TPG ~26 mmHg  . Pulmonary nodules   . S/P TAVR (transcatheter aortic valve replacement)   . Severe aortic stenosis     Past Surgical History:  Procedure Laterality Date  . APPENDECTOMY    . BREAST CYST EXCISION Right 1970  . CARDIAC VALVE REPLACEMENT    . CATARACT EXTRACTION W/  INTRAOCULAR LENS IMPLANT Right   . CHOLECYSTECTOMY OPEN    . COLONOSCOPY  2003, 2017  . CTA Chest  09/2016   Thoracic Aortic Ca2+ w/o dilation.  Coronary Calcification noted. PA normal. Mild Emphysematous changes w/ mild LLL scarring.    [name] FRACTURE SURGERY     2007 -right tib /fib fracture ----07/2009 right femur fx after a fall  . FRACTURE SURGERY Right 2011   Femur  . PERIPHERAL VASCULAR CATHETERIZATION N/A 08/09/2014   Procedure: Aortic Arch Angiography;  Surgeon: Serafina Mitchell, MD;  Location: [facility];  Service: Cardiovascular: Type 1 Arch. No significant stenosis, aneurysmal degeneration or dissection.  No luminal irregularity seen in R or L SubClavian, Brachial or Radial A. Chronic distal ulnar A occlusion [place].  [name] PERIPHERAL VASCULAR CATHETERIZATION Right 08/09/2014   Procedure: Upper Extremity Angiography;  Surgeon: Serafina Mitchell, MD;  Location: [place] [facility];  Service: Cardiovascular;  Laterality: Right;  . RIGHT/LEFT HEART CATH AND CORONARY ANGIOGRAPHY N/A 12/06/2016   Procedure: RIGHT/LEFT HEART CATH AND CORONARY ANGIOGRAPHY;  Surgeon: Leonie Man, MD;  Location: [facility]: Cor Angio: 65% dLAD (Med Rx). AVA 0.96 cm; P-P gradient ~ 30 mmHg, Mean ~24.5-27.5 (Mod AS); RHC #s: RAP 8 mmHg, RVP/EDP: 71/8/15 mmHg, PCWP: 21-24 mmHg, PAP/mean: 71/32/52 mmHg = Severe Mixed Pulmonary HTN; LVP/EDP 205/17/26 mmHg ; CO/CI by Fick: 4.35, 2.44 - mildly reduced  . RIGHT/LEFT HEART CATH AND CORONARY  ANGIOGRAPHY N/A 11/20/2017   Procedure: RIGHT/LEFT HEART CATH AND CORONARY ANGIOGRAPHY;  Surgeon: Larey Dresser, MD;  Location: [facility];  Service: Cardiovascular;  Laterality: N/A;  . TEE WITHOUT CARDIOVERSION N/A 12/30/2017   Procedure: TRANSESOPHAGEAL ECHOCARDIOGRAM (TEE);  Surgeon: Burnell Blanks, MD;  Location: [facility];  Service: Open Heart Surgery;  Laterality: N/A;  . TRANSCATHETER AORTIC VALVE REPLACEMENT, TRANSFEMORAL  12/30/2017  . TRANSCATHETER  AORTIC VALVE REPLACEMENT, TRANSFEMORAL N/A 12/30/2017   Procedure: TRANSCATHETER AORTIC VALVE REPLACEMENT, TRANSFEMORAL;  Surgeon: Burnell Blanks, MD;  Location: [facility];  Service: Open Heart Surgery;  Laterality: N/A;  . TRANSTHORACIC ECHOCARDIOGRAM  10/30/2016   Mild Concentric LVH. EF 55-60%. No RWMA, Gr 2 DD. Mod-Severe AS (mean-peak Gradient 31 mmHG - 64 mmHg), Mod MR. Mod LA dilation, Mod PA HTN - peak pressure ~54 mmHg). = Progression of AS from 2014  . ULTRASOUND GUIDANCE FOR VASCULAR ACCESS  08/09/2014   Procedure: Ultrasound Guidance For Vascular Access;  Surgeon: Serafina Mitchell, MD;  Location: [facility] [place];  Service: Cardiovascular;;    There were no vitals filed for this visit.   Subjective Assessment - 06/05/18 1058    Subjective  Patient comes in with diagnosis of left meniscus tear, she has a history of TAVR in October.  She reports left knee pain for the past 10 months, she does not remember an injury.  She reports that she is having difficulty walking, getting up from sitting and stairs    Pertinent History  TAVR 12/2018, COPD, GERD, past ORIF of the femur, and tib/fib    Limitations  Standing;Lifting;Walking;House hold activities    Patient Stated Goals  have less pain and walk better    Currently in Pain?  Yes    Pain Score  7     Pain Location  Knee    Pain Orientation  Left    Pain Descriptors / Indicators  Aching    Pain Type  Acute pain    Pain Onset  More than a month ago    Pain Frequency  Constant    Aggravating Factors   walking, stairs pain up to 10/10, "bad at night"    Pain Relieving Factors  ice, rest at best pain a 4/10    Effect of Pain on Daily Activities  reports limits everything walking,s tanding and stairs         [facility] PT Assessment - 06/05/18 0001      Assessment   Medical Diagnosis  left meniscus tear    Referring Provider (PT)  Rip Harbour    Onset Date/Surgical Date  05/07/18    Prior Therapy  no      Precautions    Precautions  None      Balance Screen   Has the patient fallen in the past 6 months  No    Has the patient had a decrease in activity level because of a fear of falling?   No    Is the patient reluctant to leave their home because of a fear of falling?   No      Home Environment   Additional Comments  stairs at home, does housework      Prior Function   Level of Independence  Independent    Vocation  Retired    Leisure  no exercise      ROM / Strength   AROM / PROM / Strength  AROM;Strength      AROM   Overall  AROM Comments  active motions increase pain    AROM Assessment Site  Knee    Right/Left Knee  Left    Left Knee Extension  20    Left Knee Flexion  80      Strength   Overall Strength Comments  strength of the left knee is 3+/5 due to pain      Palpation   Palpation comment  tender medial and lateral joint lines      Ambulation/Gait   Gait Comments  no device, very stiff legs, very small steps, antalgic bilaterally,       Standardized Balance Assessment   Standardized Balance Assessment  Timed Up and Go Test      Timed Up and Go Test   Normal TUG (seconds)  36                Objective measurements completed on examination: See above findings.              PT Education - 06/05/18 1124    Education Details  QS, SAQ, heel slides and standing HS curls    Person(s) Educated  Patient    Methods  Explanation;Demonstration;Tactile cues;Verbal cues;Handout    Comprehension  Verbalized understanding       PT Short Term Goals - 06/05/18 1130      PT SHORT TERM GOAL #1   Title  independent with intial HEP    Time  2    Period  Weeks    Status  New        PT Long Term Goals - 06/05/18 1130      PT LONG TERM GOAL #1   Title  decrease pain 50%    Time  8    Period  Weeks    Status  New      PT LONG TERM GOAL #2   Title  walk 400 feet without rest    Time  8    Period  Weeks    Status  New      PT LONG TERM GOAL #3   Title   increase hip strength to 4+/5    Time  8    Period  Weeks    Status  New      PT LONG TERM GOAL #4   Title  be able to cook a meal without having to sit and rest    Time  8    Period  Weeks    Status  New      PT LONG TERM GOAL #5   Title  increase AROM of the left knee to 0-110 degrees flexion    Time  8    Period  Weeks    Status  New             Plan - 06/05/18 1125    Clinical Impression Statement  Patient reports that she has had left knee pain for about 10 months, no known cause but MD diagnosis in meniscus tear.  She reports difficulty walking, sleeping, loss of ROM.  She has medical complications of TAVR, right knee pain due to ORIF, and COPD.  She hsa limited ROM, very difficult to walk, her TUG time was 36 seconds.  She has difficulty with quad contractions and control.    Personal Factors and Comorbidities  Fitness;Comorbidity 3+    Comorbidities  TAVR, COPD, GERD, ORIF right LE    Examination-Activity Limitations  Transfers;Locomotion Level;Stand;Stairs;Squat;Sleep    Examination-Participation Restrictions  Cleaning;Shop;Laundry  Stability/Clinical Decision Making  Evolving/Moderate complexity    Clinical Decision Making  Moderate    PT Frequency  2x / week    PT Duration  8 weeks    PT Treatment/Interventions  ADLs/Self Care Home Management;Cryotherapy;Electrical Stimulation;Moist Heat;Ultrasound;Gait training;Stair training;Functional mobility training;Therapeutic activities;Therapeutic exercise;Balance training;Neuromuscular re-education;Patient/family education;Manual techniques;Vasopneumatic Device    PT Next Visit Plan  Start activity to gain ROM, strength and function    Consulted and Agree with Plan of Care  Patient       Patient will benefit from skilled therapeutic intervention in order to improve the following deficits and impairments:  Abnormal gait, Decreased range of motion, Difficulty walking, Decreased activity tolerance, Pain, Impaired  flexibility, Decreased balance, Decreased mobility, Decreased strength, Increased edema  Visit Diagnosis: Acute pain of left knee - Plan: PT plan of care cert/re-cert  Stiffness of left knee, not elsewhere classified - Plan: PT plan of care cert/re-cert  Difficulty in walking, not elsewhere classified - Plan: PT plan of care cert/re-cert     Problem List Patient Active Problem List   Diagnosis Date Noted  . Preop cardiovascular exam 05/13/2018  . Chronic diastolic (congestive) heart failure ([place]) 12/30/2017  . GERD (gastroesophageal reflux disease)   . Hyperlipidemia   . S/P TAVR (transcatheter aortic valve replacement)   . Severe aortic stenosis   . Pulmonary nodules   . PAT (paroxysmal atrial tachycardia) ([place]) 02/07/2017  . Chronic fatigue 02/07/2017  . Pulmonary hypertension: Combined secondary as well as lung disease 12/28/2016  . Coronary artery disease, non-occlusive 11/28/2016  . Smoker 06/25/2016  . COPD (chronic obstructive pulmonary disease) ([place]) 05/22/2016  . Osteopenia 08/15/2015  . Chronic pain syndrome 07/16/2015  . Vitamin D deficiency 05/16/2015  . Essential hypertension 07/29/2014    Sumner Boast., PT 06/05/2018, 11:38 AM  [place], [place], [address] Phone: [phone]   Fax:  [phone]  Name: Alexandra [name] MRN: [id number] Date of Birth: 1950/04/03

## 2018-06-12 ENCOUNTER — Ambulatory Visit: Payer: Medicare Other | Admitting: Physical Therapy

## 2018-06-24 ENCOUNTER — Ambulatory Visit: Payer: Medicare HMO

## 2018-06-24 ENCOUNTER — Other Ambulatory Visit: Payer: Medicare HMO

## 2018-06-28 ENCOUNTER — Other Ambulatory Visit: Payer: Self-pay | Admitting: Physician Assistant

## 2018-06-30 ENCOUNTER — Other Ambulatory Visit: Payer: Self-pay

## 2018-06-30 ENCOUNTER — Encounter: Payer: Self-pay | Admitting: Physical Therapy

## 2018-06-30 ENCOUNTER — Ambulatory Visit: Payer: Medicare Other | Attending: Family Medicine | Admitting: Physical Therapy

## 2018-06-30 DIAGNOSIS — M25662 Stiffness of left knee, not elsewhere classified: Secondary | ICD-10-CM | POA: Insufficient documentation

## 2018-06-30 DIAGNOSIS — R262 Difficulty in walking, not elsewhere classified: Secondary | ICD-10-CM

## 2018-06-30 DIAGNOSIS — M25562 Pain in left knee: Secondary | ICD-10-CM | POA: Insufficient documentation

## 2018-06-30 NOTE — Therapy (Signed)
[place], [place], [address] Phone: [phone]   Fax:  [phone]  Physical Therapy Treatment  Patient Details  Name: Alexandra [name] MRN: [id number] Date of Birth: September 14, 1950 Referring Provider (PT): Rip Harbour   Encounter Date: 06/30/2018  PT End of Session - 06/30/18 1226    Visit Number  2    Date for PT Re-Evaluation  08/05/18    PT Start Time  4496    PT Stop Time  1225    PT Time Calculation (min)  40 min    Activity Tolerance  Patient tolerated treatment well    Behavior During Therapy  [facility] for tasks assessed/performed       Past Medical History:  Diagnosis Date  . COPD (chronic obstructive pulmonary disease) ([place]) 05/22/2016  . Coronary artery disease, non-occlusive 11/28/2016   R&LHC: 65% distal LAD lesion -- likely not angiographically significant->medical management. Normal LVEF. Moderately elevated LVEDP. I aortic valve gradient in the Cath Lab, moderate aortic stenosis. This would suggest that the stenosis is on the moderate side of moderate to severe.  Severe pulmonary hypertension (likely mixed).  AVA 0.96 cm; P-P gradient ~ 30 mmHg, Mean ~24.5-27.5 (Moderate AS)   . GERD (gastroesophageal reflux disease)   . Heart murmur   . Hx of adenomatous colonic polyps 10/19/2015  . Hyperlipidemia   . Hypertension   . Moderate mitral regurgitation by prior echocardiography 10/2016   Echocardiogram revealed moderate mitral regurgitation  . Pulmonary hypertension associated Aortic Stenosis, Mitral Regurgitation & COPD    Peak PAP by Echo ~54 mmHg;  by Cath PAP/Mean 71/32 mmHg, 53 mmHg (with PCWP & LVEDP ~24-26 mmHg); TPG ~26 mmHg  . Pulmonary nodules   . S/P TAVR (transcatheter aortic valve replacement)   . Severe aortic stenosis     Past Surgical History:  Procedure Laterality Date  . APPENDECTOMY    . BREAST CYST EXCISION Right 1970  . CARDIAC VALVE REPLACEMENT    . CATARACT EXTRACTION  W/ INTRAOCULAR LENS IMPLANT Right   . CHOLECYSTECTOMY OPEN    . COLONOSCOPY  2003, 2017  . CTA Chest  09/2016   Thoracic Aortic Ca2+ w/o dilation.  Coronary Calcification noted. PA normal. Mild Emphysematous changes w/ mild LLL scarring.    [name] FRACTURE SURGERY     2007 -right tib /fib fracture ----07/2009 right femur fx after a fall  . FRACTURE SURGERY Right 2011   Femur  . PERIPHERAL VASCULAR CATHETERIZATION N/A 08/09/2014   Procedure: Aortic Arch Angiography;  Surgeon: Serafina Mitchell, MD;  Location: [place] [facility];  Service: Cardiovascular: Type 1 Arch. No significant stenosis, aneurysmal degeneration or dissection.  No luminal irregularity seen in R or L SubClavian, Brachial or Radial A. Chronic distal ulnar A occlusion [place].  [name] PERIPHERAL VASCULAR CATHETERIZATION Right 08/09/2014   Procedure: Upper Extremity Angiography;  Surgeon: Serafina Mitchell, MD;  Location: [place] [facility];  Service: Cardiovascular;  Laterality: Right;  . RIGHT/LEFT HEART CATH AND CORONARY ANGIOGRAPHY N/A 12/06/2016   Procedure: RIGHT/LEFT HEART CATH AND CORONARY ANGIOGRAPHY;  Surgeon: Leonie Man, MD;  [facility]: [facility]: Cor Angio: 65% dLAD (Med Rx). AVA 0.96 cm; P-P gradient ~ 30 mmHg, Mean ~24.5-27.5 (Mod AS); RHC #s: RAP 8 mmHg, RVP/EDP: 71/8/15 mmHg, PCWP: 21-24 mmHg, PAP/mean: 71/32/52 mmHg = Severe Mixed Pulmonary HTN; LVP/EDP 205/17/26 mmHg ; CO/CI by Fick: 4.35, 2.44 - mildly reduced  . RIGHT/LEFT HEART CATH AND CORONARY  ANGIOGRAPHY N/A 11/20/2017   Procedure: RIGHT/LEFT HEART CATH AND CORONARY ANGIOGRAPHY;  Surgeon: Larey Dresser, MD;  Location: [place] [facility];  Service: Cardiovascular;  Laterality: N/A;  . TEE WITHOUT CARDIOVERSION N/A 12/30/2017   Procedure: TRANSESOPHAGEAL ECHOCARDIOGRAM (TEE);  Surgeon: Burnell Blanks, MD;  Location: [place];  Service: Open Heart Surgery;  Laterality: N/A;  . TRANSCATHETER AORTIC VALVE REPLACEMENT, TRANSFEMORAL  12/30/2017  .  TRANSCATHETER AORTIC VALVE REPLACEMENT, TRANSFEMORAL N/A 12/30/2017   Procedure: TRANSCATHETER AORTIC VALVE REPLACEMENT, TRANSFEMORAL;  Surgeon: Burnell Blanks, MD;  Location: [place];  Service: Open Heart Surgery;  Laterality: N/A;  . TRANSTHORACIC ECHOCARDIOGRAM  10/30/2016   Mild Concentric LVH. EF 55-60%. No RWMA, Gr 2 DD. Mod-Severe AS (mean-peak Gradient 31 mmHG - 64 mmHg), Mod MR. Mod LA dilation, Mod PA HTN - peak pressure ~54 mmHg). = Progression of AS from 2014  . ULTRASOUND GUIDANCE FOR VASCULAR ACCESS  08/09/2014   Procedure: Ultrasound Guidance For Vascular Access;  Surgeon: Serafina Mitchell, MD;  Location: [place] [facility];  Service: Cardiovascular;;    There were no vitals filed for this visit.  Subjective Assessment - 06/30/18 1144    Subjective  Pt reports no change since evaluation    Currently in Pain?  Yes    Pain Score  6     Pain Location  Knee    Pain Orientation  Left         [facility] PT Assessment - 06/30/18 0001      AROM   AROM Assessment Site  Knee    Right/Left Knee  Left    Left Knee Extension  15    Left Knee Flexion  122                   [facility] Adult PT Treatment/Exercise - 06/30/18 0001      Exercises   Exercises  Knee/Hip      Knee/Hip Exercises: Aerobic   Nustep  L2 x 6 min       Knee/Hip Exercises: Seated   Long Arc Quad  Left;2 sets;10 reps;Strengthening    Long Arc Quad Weight  1 lbs.    Ball Squeeze  2x10 3 sec hold    Other Seated Knee/Hip Exercises  Green tband quad  sets RLE 2x10    Hamstring Curl  Left;10 reps;2 sets;Strengthening   had to stop during 2ns set due to cramping   Hamstring Limitations  Red Tband    Abduction/Adduction   Both;2 sets;15 reps    Abd/Adduction Limitations  green tband    Sit to Sand  2 sets;10 reps;Other (comment);without UE support   elevated UBE seat              PT Short Term Goals - 06/05/18 1130      PT SHORT TERM GOAL #1   Title  independent with intial HEP     Time  2    Period  Weeks    Status  New        PT Long Term Goals - 06/05/18 1130      PT LONG TERM GOAL #1   Title  decrease pain 50%    Time  8    Period  Weeks    Status  New      PT LONG TERM GOAL #2   Title  walk 400 feet without rest    Time  8    Period  Weeks    Status  New      PT LONG TERM GOAL #3   Title  increase hip strength to 4+/5    Time  8    Period  Weeks    Status  New      PT LONG TERM GOAL #4   Title  be able to cook a meal without having to sit and rest    Time  8    Period  Weeks    Status  New      PT LONG TERM GOAL #5   Title  increase AROM of the left knee to 0-110 degrees flexion    Time  8    Period  Weeks    Status  New            Plan - 06/30/18 1226    Clinical Impression Statement  Pt enters clinic reporting knee pain. This pain seemed to decrease as therapy session progressed. She has progressed increasing her L knee AROM. Constant cues needed to get a quat contraction with quad sets. Pt reports some cramping with HS curls.    Personal Factors and Comorbidities  Fitness;Comorbidity 3+    Comorbidities  TAVR, COPD, GERD, ORIF right LE    Examination-Activity Limitations  Transfers;Locomotion Level;Stand;Stairs;Squat;Sleep    Examination-Participation Restrictions  Cleaning;Shop;Laundry    Stability/Clinical Decision Making  Evolving/Moderate complexity    PT Frequency  2x / week    PT Duration  8 weeks    PT Treatment/Interventions  ADLs/Self Care Home Management;Cryotherapy;Electrical Stimulation;Moist Heat;Ultrasound;Gait training;Stair training;Functional mobility training;Therapeutic activities;Therapeutic exercise;Balance training;Neuromuscular re-education;Patient/family education;Manual techniques;Vasopneumatic Device    PT Next Visit Plan  Start activity to gain ROM, strength and function       Patient will benefit from skilled therapeutic intervention in order to improve the following deficits and impairments:   Abnormal gait, Decreased range of motion, Difficulty walking, Decreased activity tolerance, Pain, Impaired flexibility, Decreased balance, Decreased mobility, Decreased strength, Increased edema  Visit Diagnosis: Acute pain of left knee  Stiffness of left knee, not elsewhere classified  Difficulty in walking, not elsewhere classified     Problem List Patient Active Problem List   Diagnosis Date Noted  . Preop cardiovascular exam 05/13/2018  . Chronic diastolic (congestive) heart failure ([place]) 12/30/2017  . GERD (gastroesophageal reflux disease)   . Hyperlipidemia   . S/P TAVR (transcatheter aortic valve replacement)   . Severe aortic stenosis   . Pulmonary nodules   . PAT (paroxysmal atrial tachycardia) ([place]) 02/07/2017  . Chronic fatigue 02/07/2017  . Pulmonary hypertension: Combined secondary as well as lung disease 12/28/2016  . Coronary artery disease, non-occlusive 11/28/2016  . Smoker 06/25/2016  . COPD (chronic obstructive pulmonary disease) ([place]) 05/22/2016  . Osteopenia 08/15/2015  . Chronic pain syndrome 07/16/2015  . Vitamin D deficiency 05/16/2015  . Essential hypertension 07/29/2014    Scot Jun, PTA 06/30/2018, 12:40 PM  [place], [place], [address] Phone: [phone]   Fax:  [phone]  Name: Alexandra [name] MRN: [id number] Date of Birth: February 14, 1951

## 2018-07-07 ENCOUNTER — Ambulatory Visit: Payer: Medicare Other | Admitting: Physical Therapy

## 2018-09-09 ENCOUNTER — Telehealth (HOSPITAL_COMMUNITY): Payer: Self-pay | Admitting: Cardiology

## 2018-09-09 NOTE — Telephone Encounter (Signed)
Called and left patient message that we are cancelling 09/17/2018 appt with Dr. Aundra Dubin at this time and that we have added her name to his waitlist.  Also advised if pt was having any problems to call our office and press option for Triage.  I also asked pt to call back to confirm she had received this message.

## 2018-09-09 NOTE — Telephone Encounter (Signed)
Patient returned my call and confirmed with [facility].

## 2018-09-17 ENCOUNTER — Encounter: Payer: Self-pay | Admitting: Internal Medicine

## 2018-09-17 ENCOUNTER — Encounter (HOSPITAL_COMMUNITY): Payer: Medicare Other | Admitting: Cardiology

## 2018-10-30 IMAGING — CR DG KNEE COMPLETE 4+V*L*
4 series · 4 of 4 positions shown · non-contrast
Comparison: None.

CLINICAL DATA: Acute left pain for 2 weeks.  No trauma.

EXAM:
LEFT KNEE - COMPLETE 4+ VIEW

[w knee ap left]
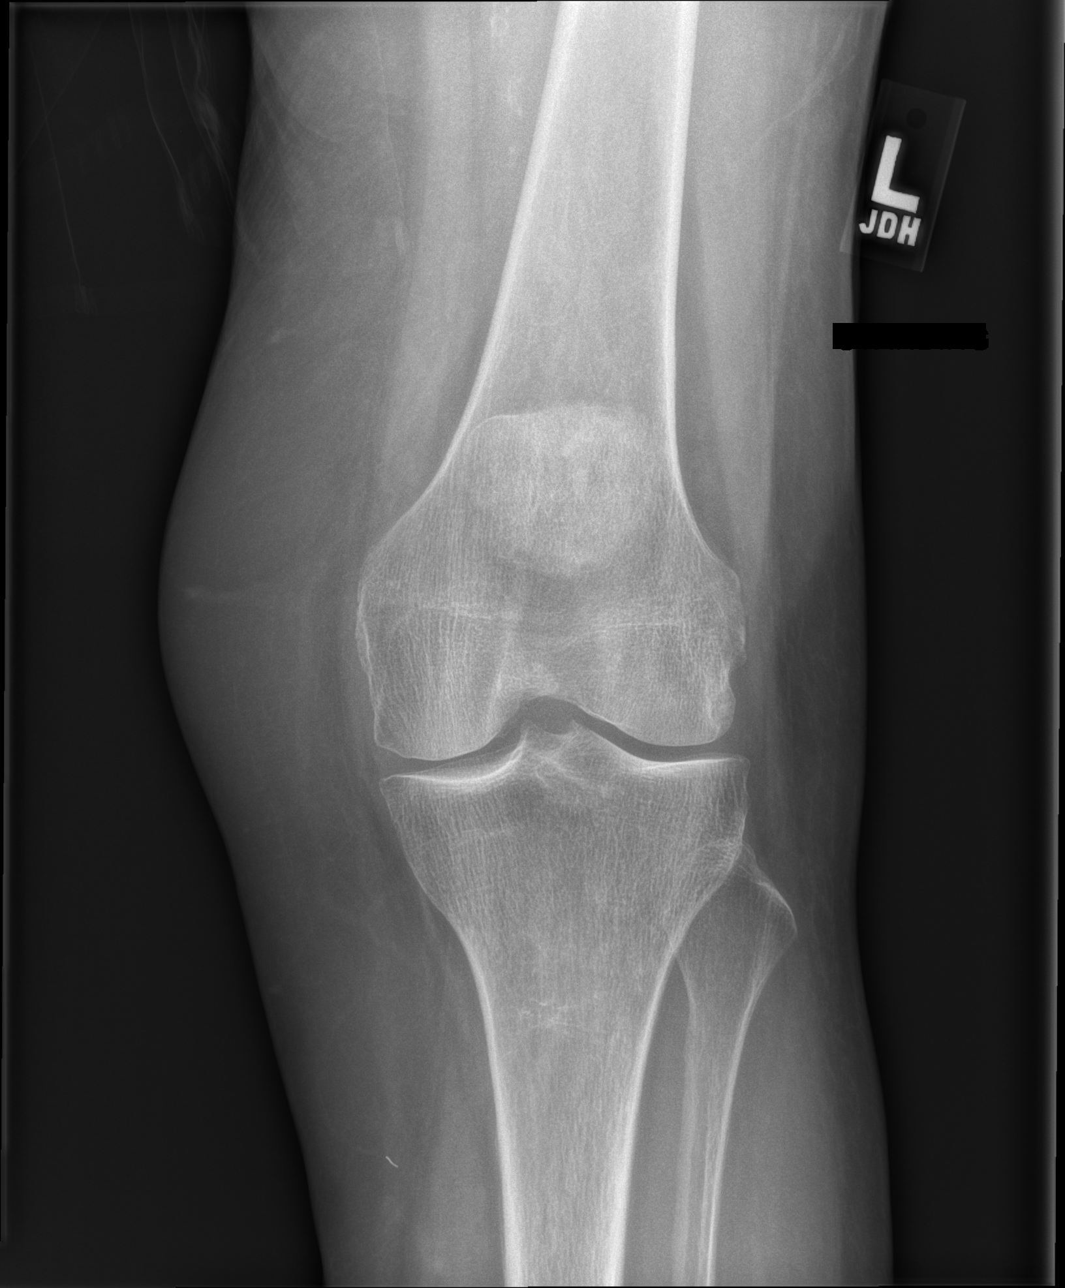

[w knee lat left]
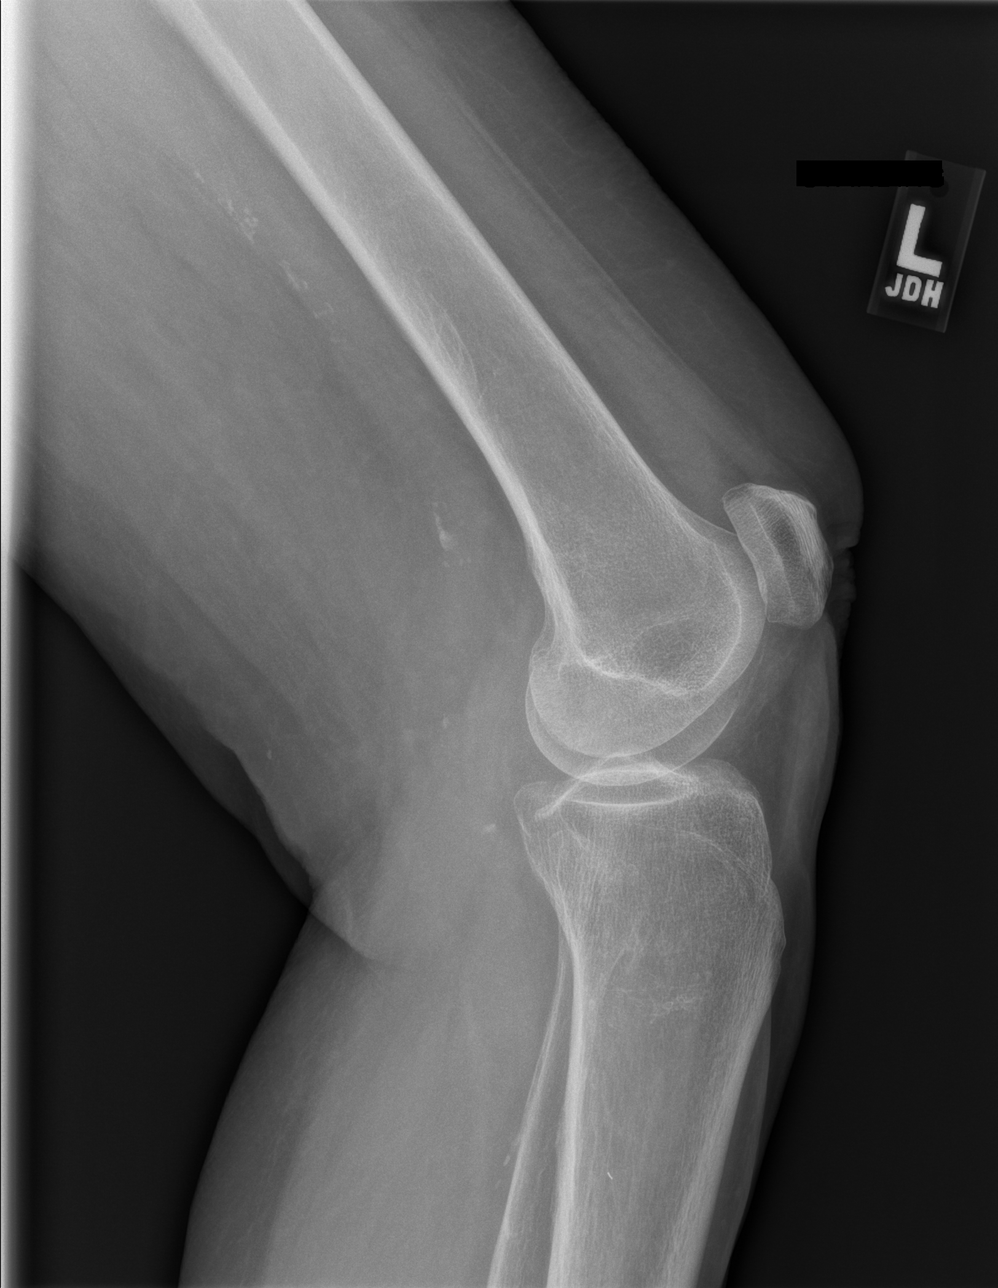

[x knee tunnel left]
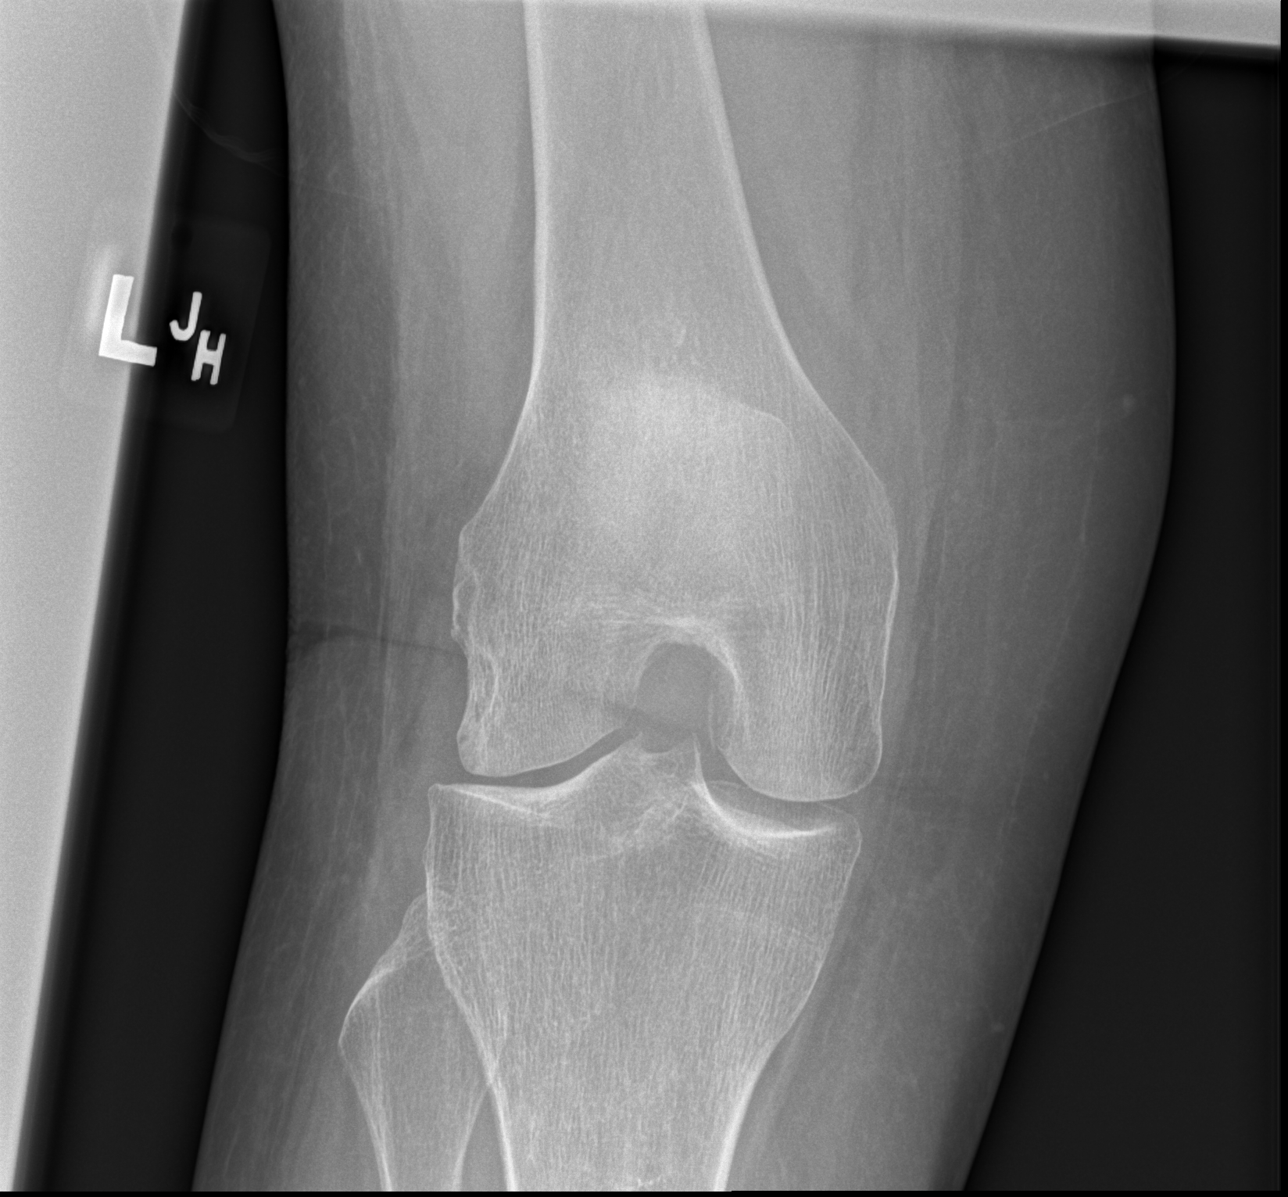

[x knee sunrise left]
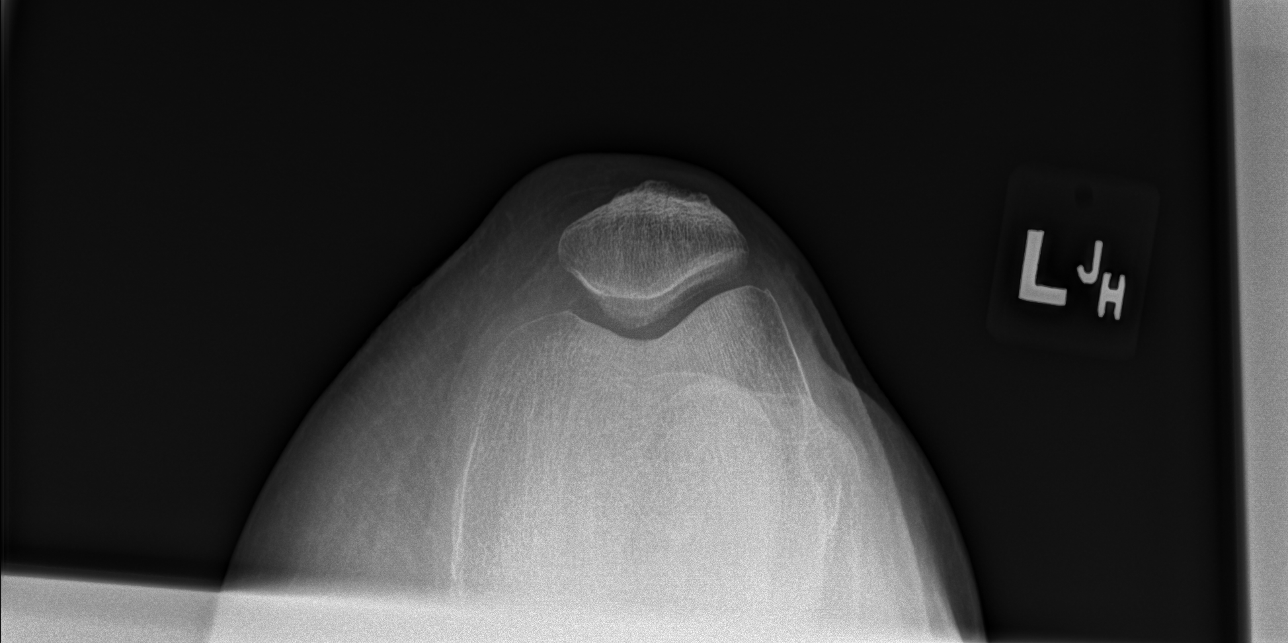

[4 of 4 positions shown; findings below may reference images not displayed]

FINDINGS: Vascular calcifications. No significant degenerative changes. No
fractures or dislocations.
IMPRESSION: Negative.

## 2018-11-04 ENCOUNTER — Telehealth (HOSPITAL_COMMUNITY): Payer: Self-pay

## 2018-11-04 NOTE — Telephone Encounter (Signed)
Received cardiac clearance request from [facility].  Clearance completed by MD and faxed back. Confirmation received.

## 2018-11-09 ENCOUNTER — Other Ambulatory Visit: Payer: Self-pay | Admitting: Orthopaedic Surgery

## 2018-11-24 NOTE — Patient Instructions (Addendum)
DUE TO COVID-19 ONLY ONE VISITOR IS ALLOWED TO COME WITH YOU AND STAY IN THE WAITING ROOM ONLY DURING PRE OP AND PROCEDURE DAY OF SURGERY. THE 1 VISITOR MAY VISIT WITH YOU AFTER SURGERY IN YOUR PRIVATE ROOM DURING VISITING HOURS ONLY!  YOU NEED TO HAVE A COVID 19 TEST ON_______ @_______ , THIS TEST MUST BE DONE BEFORE SURGERY, COME  [place] , [address].  ([place]) ONCE YOUR COVID TEST IS COMPLETED, PLEASE BEGIN THE QUARANTINE INSTRUCTIONS AS OUTLINED IN YOUR HANDOUT.                Alexandra [name]  11/24/2018   Your procedure is scheduled on: Tuesday 12/01/2018   Report to [facility] Main  Entrance              Report to admitting at [place]  AM     Call this number if you have problems the morning of surgery [phone]    Remember: Do not eat food :After Midnight.              NO SOLID FOOD AFTER MIDNIGHT THE NIGHT PRIOR TO SURGERY. NOTHING BY MOUTH EXCEPT CLEAR LIQUIDS UNTIL 0910 am .               PLEASE FINISH ENSURE DRINK PER SURGEON ORDER  WHICH NEEDS TO BE COMPLETED AT 0910 am  .   CLEAR LIQUID DIET   Foods Allowed                                                                     Foods Excluded  Coffee and tea, regular and decaf                             liquids that you cannot  Plain Jell-O any favor except red or purple                                           see through such as: Fruit ices (not with fruit pulp)                                     milk, soups, orange juice  Iced Popsicles                                    All solid food Carbonated beverages, regular and diet                                    Cranberry, grape and apple juices Sports drinks like Gatorade Lightly seasoned clear broth or consume(fat free) Sugar, honey syrup  Sample Menu Breakfast                                Lunch  Supper Cranberry juice                    Beef broth                             Chicken broth Jell-O                                     Grape juice                           Apple juice Coffee or tea                        Jell-O                                      Popsicle                                                Coffee or tea                        Coffee or tea  _____________________________________________________________________               BRUSH YOUR TEETH MORNING OF SURGERY AND RINSE YOUR MOUTH OUT, NO CHEWING GUM CANDY OR MINTS.     Take these medicines the morning of surgery with A SIP OF WATER: use Flovent inhaler if needed and bring inhaler with you to the hospital             Remember: No smoking cigarettes the day before surgery!                               You may not have any metal on your body including hair pins and              piercings  Do not wear jewelry, make-up, lotions, powders or perfumes, deodorant             Do not wear nail polish.  Do not shave  48 hours prior to surgery.             Do not bring valuables to the hospital. [facility].  Contacts, dentures or bridgework may not be worn into surgery.  Leave suitcase in the car. After surgery it may be brought to your room.                  Please read over the following fact sheets you were given: _____________________________________________________________________             [facility] - Preparing for Surgery Before surgery, you can play an important role.  Because skin is not sterile, your skin needs to be as free of germs as possible.  You can reduce the number of germs on your skin by washing with CHG (chlorahexidine gluconate) soap before surgery.  CHG is an  antiseptic cleaner which kills germs and bonds with the skin to continue killing germs even after washing. Please DO NOT use if you have an allergy to CHG or antibacterial soaps.  If your skin becomes reddened/irritated stop using the CHG and inform your nurse  when you arrive at Short Stay. Do not shave (including legs and underarms) for at least 48 hours prior to the first CHG shower.  You may shave your face/neck. Please follow these instructions carefully:  1.  Shower with CHG Soap the night before surgery and the  morning of Surgery.  2.  If you choose to wash your hair, wash your hair first as usual with your  normal  shampoo.  3.  After you shampoo, rinse your hair and body thoroughly to remove the  shampoo.                           4.  Use CHG as you would any other liquid soap.  You can apply chg directly  to the skin and wash                       Gently with a scrungie or clean washcloth.  5.  Apply the CHG Soap to your body ONLY FROM THE NECK DOWN.   Do not use on face/ open                           Wound or open sores. Avoid contact with eyes, ears mouth and genitals (private parts).                       Wash face,  Genitals (private parts) with your normal soap.             6.  Wash thoroughly, paying special attention to the area where your surgery  will be performed.  7.  Thoroughly rinse your body with warm water from the neck down.  8.  DO NOT shower/wash with your normal soap after using and rinsing off  the CHG Soap.                9.  Pat yourself dry with a clean towel.            10.  Wear clean pajamas.            11.  Place clean sheets on your bed the night of your first shower and do not  sleep with pets. Day of Surgery : Do not apply any lotions/deodorants the morning of surgery.  Please wear clean clothes to the hospital/surgery center.  FAILURE TO FOLLOW THESE INSTRUCTIONS MAY RESULT IN THE CANCELLATION OF YOUR SURGERY PATIENT SIGNATURE_________________________________  NURSE SIGNATURE__________________________________  ________________________________________________________________________   Alexandra [name]  An incentive spirometer is a tool that can help keep your lungs clear and active. This tool  measures how well you are filling your lungs with each breath. Taking long deep breaths may help reverse or decrease the chance of developing breathing (pulmonary) problems (especially infection) following:  A long period of time when you are unable to move or be active. BEFORE THE PROCEDURE   If the spirometer includes an indicator to show your best effort, your nurse or respiratory therapist will set it to a desired goal.  If possible, sit up straight or lean slightly forward. Try not to slouch.  Hold the  incentive spirometer in an upright position. INSTRUCTIONS FOR USE  1. Sit on the edge of your bed if possible, or sit up as far as you can in bed or on a chair. 2. Hold the incentive spirometer in an upright position. 3. Breathe out normally. 4. Place the mouthpiece in your mouth and seal your lips tightly around it. 5. Breathe in slowly and as deeply as possible, raising the piston or the ball toward the top of the column. 6. Hold your breath for 3-5 seconds or for as long as possible. Allow the piston or ball to fall to the bottom of the column. 7. Remove the mouthpiece from your mouth and breathe out normally. 8. Rest for a few seconds and repeat Steps 1 through 7 at least 10 times every 1-2 hours when you are awake. Take your time and take a few normal breaths between deep breaths. 9. The spirometer may include an indicator to show your best effort. Use the indicator as a goal to work toward during each repetition. 10. After each set of 10 deep breaths, practice coughing to be sure your lungs are clear. If you have an incision (the cut made at the time of surgery), support your incision when coughing by placing a pillow or rolled up towels firmly against it. Once you are able to get out of bed, walk around indoors and cough well. You may stop using the incentive spirometer when instructed by your caregiver.  RISKS AND COMPLICATIONS  Take your time so you do not get dizzy or  light-headed.  If you are in pain, you may need to take or ask for pain medication before doing incentive spirometry. It is harder to take a deep breath if you are having pain. AFTER USE  Rest and breathe slowly and easily.  It can be helpful to keep track of a log of your progress. Your caregiver can provide you with a simple table to help with this. If you are using the spirometer at home, follow these instructions: [place] IF:   You are having difficultly using the spirometer.  You have trouble using the spirometer as often as instructed.  Your pain medication is not giving enough relief while using the spirometer.  You develop fever of 100.5 F (38.1 C) or higher. SEEK IMMEDIATE MEDICAL CARE IF:   You cough up bloody sputum that had not been present before.  You develop fever of 102 F (38.9 C) or greater.  You develop worsening pain at or near the incision site. MAKE SURE YOU:   Understand these instructions.  Will watch your condition.  Will get help right away if you are not doing well or get worse. Document Released: 07/22/2006 Document Revised: 06/03/2011 Document Reviewed: 09/22/2006 ExitCare Patient Information 2014 ExitCare, [place].   ________________________________________________________________________  WHAT IS A BLOOD TRANSFUSION? Blood Transfusion Information  A transfusion is the replacement of blood or some of its parts. Blood is made up of multiple cells which provide different functions.  Red blood cells carry oxygen and are used for blood loss replacement.  White blood cells fight against infection.  Platelets control bleeding.  Plasma helps clot blood.  Other blood products are available for specialized needs, such as hemophilia or other clotting disorders. BEFORE THE TRANSFUSION  Who gives blood for transfusions?   Healthy volunteers who are fully evaluated to make sure their blood is safe. This is blood bank  blood. Transfusion therapy is the safest it has ever been in  the practice of medicine. Before blood is taken from a donor, a complete history is taken to make sure that person has no history of diseases nor engages in risky social behavior (examples are intravenous drug use or sexual activity with multiple partners). The donor's travel history is screened to minimize risk of transmitting infections, such as malaria. The donated blood is tested for signs of infectious diseases, such as HIV and hepatitis. The blood is then tested to be sure it is compatible with you in order to minimize the chance of a transfusion reaction. If you or a relative donates blood, this is often done in anticipation of surgery and is not appropriate for emergency situations. It takes many days to process the donated blood. RISKS AND COMPLICATIONS Although transfusion therapy is very safe and saves many lives, the main dangers of transfusion include:   Getting an infectious disease.  Developing a transfusion reaction. This is an allergic reaction to something in the blood you were given. Every precaution is taken to prevent this. The decision to have a blood transfusion has been considered carefully by your caregiver before blood is given. Blood is not given unless the benefits outweigh the risks. AFTER THE TRANSFUSION  Right after receiving a blood transfusion, you will usually feel much better and more energetic. This is especially true if your red blood cells have gotten low (anemic). The transfusion raises the level of the red blood cells which carry oxygen, and this usually causes an energy increase.  The nurse administering the transfusion will monitor you carefully for complications. HOME CARE INSTRUCTIONS  No special instructions are needed after a transfusion. You may find your energy is better. Speak with your caregiver about any limitations on activity for underlying diseases you may have. SEEK MEDICAL CARE IF:    Your condition is not improving after your transfusion.  You develop redness or irritation at the intravenous (IV) site. SEEK IMMEDIATE MEDICAL CARE IF:  Any of the following symptoms occur over the next 12 hours:  Shaking chills.  You have a temperature by mouth above 102 F (38.9 C), not controlled by medicine.  Chest, back, or muscle pain.  People around you feel you are not acting correctly or are confused.  Shortness of breath or difficulty breathing.  Dizziness and fainting.  You get a rash or develop hives.  You have a decrease in urine output.  Your urine turns a dark color or changes to pink, red, or brown. Any of the following symptoms occur over the next 10 days:  You have a temperature by mouth above 102 F (38.9 C), not controlled by medicine.  Shortness of breath.  Weakness after normal activity.  The white part of the eye turns yellow (jaundice).  You have a decrease in the amount of urine or are urinating less often.  Your urine turns a dark color or changes to pink, red, or brown. Document Released: 03/08/2000 Document Revised: 06/03/2011 Document Reviewed: 10/26/2007 [facility] Patient Information 2014 [place], [place].  _______________________________________________________________________

## 2018-11-24 NOTE — Progress Notes (Signed)
  06/04/2018- office visit with Dr. Aundra Dubin (HF clinic)  03/05/2018- noted in [place]- EKG  02/04/2018- noted in [place]  12/30/2017- noted in [place]- Transcatheter aortic valve replacement transfemoral surgery  12/29/2017- noted in [facility]-CXR 2 view  11/20/2017-noted in Epic- Right /Left heart cath

## 2018-11-25 ENCOUNTER — Telehealth: Payer: Self-pay | Admitting: Physician Assistant

## 2018-11-25 NOTE — H&P (Signed)
TOTAL KNEE ADMISSION H&P  Patient is being admitted for left total knee arthroplasty.  Subjective:  Chief Complaint:left knee pain.  HPI: Alexandra [name], 68 y.o. female, has a history of pain and functional disability in the left knee due to arthritis and has failed non-surgical conservative treatments for greater than 12 weeks to includeNSAID's and/or analgesics, corticosteriod injections, flexibility and strengthening excercises, use of assistive devices, weight reduction as appropriate and activity modification.  Onset of symptoms was gradual, starting 5 years ago with gradually worsening course since that time. The patient noted no past surgery on the left knee(s).  Patient currently rates pain in the left knee(s) at 10 out of 10 with activity. Patient has night pain, worsening of pain with activity and weight bearing, pain that interferes with activities of daily living, crepitus and joint swelling.  Patient has evidence of subchondral cysts, subchondral sclerosis, periarticular osteophytes and joint space narrowing by imaging studies. There is no active infection.  Patient Active Problem List   Diagnosis Date Noted  . Preop cardiovascular exam 05/13/2018  . Chronic diastolic (congestive) heart failure ([place]) 12/30/2017  . GERD (gastroesophageal reflux disease)   . Hyperlipidemia   . S/P TAVR (transcatheter aortic valve replacement)   . Severe aortic stenosis   . Pulmonary nodules   . PAT (paroxysmal atrial tachycardia) ([place]) 02/07/2017  . Chronic fatigue 02/07/2017  . Pulmonary hypertension: Combined secondary as well as lung disease 12/28/2016  . Coronary artery disease, non-occlusive 11/28/2016  . Smoker 06/25/2016  . COPD (chronic obstructive pulmonary disease) ([place]) 05/22/2016  . Osteopenia 08/15/2015  . Chronic pain syndrome 07/16/2015  . Vitamin D deficiency 05/16/2015  . Essential hypertension 07/29/2014   Past Medical History:  Diagnosis Date  . COPD (chronic obstructive  pulmonary disease) ([place]) 05/22/2016  . Coronary artery disease, non-occlusive 11/28/2016   R&LHC: 65% distal LAD lesion -- likely not angiographically significant->medical management. Normal LVEF. Moderately elevated LVEDP. I aortic valve gradient in the Cath Lab, moderate aortic stenosis. This would suggest that the stenosis is on the moderate side of moderate to severe.  Severe pulmonary hypertension (likely mixed).  AVA 0.96 cm; P-P gradient ~ 30 mmHg, Mean ~24.5-27.5 (Moderate AS)   . GERD (gastroesophageal reflux disease)   . Heart murmur   . Hx of adenomatous colonic polyps 10/19/2015  . Hyperlipidemia   . Hypertension   . Moderate mitral regurgitation by prior echocardiography 10/2016   Echocardiogram revealed moderate mitral regurgitation  . Pulmonary hypertension associated Aortic Stenosis, Mitral Regurgitation & COPD    Peak PAP by Echo ~54 mmHg;  by Cath PAP/Mean 71/32 mmHg, 53 mmHg (with PCWP & LVEDP ~24-26 mmHg); TPG ~26 mmHg  . Pulmonary nodules   . S/P TAVR (transcatheter aortic valve replacement)   . Severe aortic stenosis     Past Surgical History:  Procedure Laterality Date  . APPENDECTOMY    . BREAST CYST EXCISION Right 1970  . CARDIAC VALVE REPLACEMENT    . CATARACT EXTRACTION W/ INTRAOCULAR LENS IMPLANT Right   . CHOLECYSTECTOMY OPEN    . COLONOSCOPY  2003, 2017  . CTA Chest  09/2016   Thoracic Aortic Ca2+ w/o dilation.  Coronary Calcification noted. PA normal. Mild Emphysematous changes w/ mild LLL scarring.    [name] FRACTURE SURGERY     2007 -right tib /fib fracture ----07/2009 right femur fx after a fall  . FRACTURE SURGERY Right 2011   Femur  . PERIPHERAL VASCULAR CATHETERIZATION N/A 08/09/2014   Procedure: Aortic Arch Angiography;  Surgeon: Serafina Mitchell, MD;  Location: [place] [facility];  Service: Cardiovascular: Type 1 Arch. No significant stenosis, aneurysmal degeneration or dissection.  No luminal irregularity seen in R or L SubClavian, Brachial or Radial A.  Chronic distal ulnar A occlusion Bilatera.  [name] PERIPHERAL VASCULAR CATHETERIZATION Right 08/09/2014   Procedure: Upper Extremity Angiography;  Surgeon: Serafina Mitchell, MD;  Location: [place] [facility];  Service: Cardiovascular;  Laterality: Right;  . RIGHT/LEFT HEART CATH AND CORONARY ANGIOGRAPHY N/A 12/06/2016   Procedure: RIGHT/LEFT HEART CATH AND CORONARY ANGIOGRAPHY;  Surgeon: Leonie Man, MD;  [facility]: Cor Angio: 65% dLAD (Med Rx). AVA 0.96 cm; P-P gradient ~ 30 mmHg, Mean ~24.5-27.5 (Mod AS); RHC #s: RAP 8 mmHg, RVP/EDP: 71/8/15 mmHg, PCWP: 21-24 mmHg, PAP/mean: 71/32/52 mmHg = Severe Mixed Pulmonary HTN; LVP/EDP 205/17/26 mmHg ; CO/CI by Fick: 4.35, 2.44 - mildly reduced  . RIGHT/LEFT HEART CATH AND CORONARY ANGIOGRAPHY N/A 11/20/2017   Procedure: RIGHT/LEFT HEART CATH AND CORONARY ANGIOGRAPHY;  Surgeon: Larey Dresser, MD;  Location: [place] [facility];  Service: Cardiovascular;  Laterality: N/A;  . TEE WITHOUT CARDIOVERSION N/A 12/30/2017   Procedure: TRANSESOPHAGEAL ECHOCARDIOGRAM (TEE);  Surgeon: Burnell Blanks, MD;  Location: [place];  Service: Open Heart Surgery;  Laterality: N/A;  . TRANSCATHETER AORTIC VALVE REPLACEMENT, TRANSFEMORAL  12/30/2017  . TRANSCATHETER AORTIC VALVE REPLACEMENT, TRANSFEMORAL N/A 12/30/2017   Procedure: TRANSCATHETER AORTIC VALVE REPLACEMENT, TRANSFEMORAL;  Surgeon: Burnell Blanks, MD;  Location: [place];  Service: Open Heart Surgery;  Laterality: N/A;  . TRANSTHORACIC ECHOCARDIOGRAM  10/30/2016   Mild Concentric LVH. EF 55-60%. No RWMA, Gr 2 DD. Mod-Severe AS (mean-peak Gradient 31 mmHG - 64 mmHg), Mod MR. Mod LA dilation, Mod PA HTN - peak pressure ~54 mmHg). = Progression of AS from 2014  . ULTRASOUND GUIDANCE FOR VASCULAR ACCESS  08/09/2014   Procedure: Ultrasound Guidance For Vascular Access;  Surgeon: Serafina Mitchell, MD;  Location: [place] [facility];  Service: Cardiovascular;;    No current facility-administered  medications for this encounter.    Current Outpatient Medications  Medication Sig Dispense Refill Last Dose  . amoxicillin (AMOXIL) 500 MG tablet Take 2,000 mg (four tablets) one hour prior to dental visits. 8 tablet 3   . aspirin 81 MG tablet Take 1 tablet (81 mg total) by mouth daily. 30 tablet    . Azelastine HCl 0.15 % SOLN Place 2 sprays into the nose 2 (two) times daily as needed (allergies). 30 mL PRN   . bisoprolol-hydrochlorothiazide (ZIAC) 10-6.25 MG tablet Take 1 tablet by mouth daily. 30 tablet 6   . fluticasone (FLOVENT HFA) 220 MCG/ACT inhaler Inhale 2 puffs into the lungs 2 (two) times daily as needed (wheezing and shortness of breath).      . furosemide (LASIX) 20 MG tablet Take 1 tablet (20 mg total) by mouth 2 (two) times daily. (Patient taking differently: Take 20 mg by mouth daily. ) 60 tablet 3   . losartan (COZAAR) 100 MG tablet Take 1 tablet (100 mg total) by mouth daily. 90 tablet 3   . oxybutynin (DITROPAN-XL) 10 MG 24 hr tablet TAKE 1 TABLET BY MOUTH AT BEDTIME (Patient taking differently: Take 10 mg by mouth at bedtime as needed (urinary urgency). ) 90 tablet 0   . oxyCODONE-acetaminophen (PERCOCET) 10-325 MG tablet Take 1 tablet by mouth 4 (four) times daily as needed for pain.      . potassium chloride SA (K-DUR,KLOR-CON) 20  MEQ tablet Take 1 tablet (20 mEq total) by mouth daily. 30 tablet 6   . rosuvastatin (CRESTOR) 10 MG tablet Take 1 tablet (10 mg total) by mouth daily. 90 tablet 3   . Vitamin D, Ergocalciferol, (DRISDOL) 1.25 MG (50000 UT) CAPS capsule Take 50,000 Units by mouth every 7 (seven) days.     [name] amLODipine (NORVASC) 10 MG tablet Take 1 tablet (10 mg total) by mouth daily. (Patient not taking: Reported on 11/23/2018) 90 tablet 3 Not Taking at Unknown time  . clopidogrel (PLAVIX) 75 MG tablet TAKE 1 TABLET(75 MG) BY MOUTH DAILY WITH BREAKFAST (Patient not taking: Reported on 11/23/2018) 90 tablet 1 Not Taking at Unknown time  . montelukast (SINGULAIR) 10 MG  tablet Take 1 tablet (10 mg total) by mouth at bedtime. (Patient not taking: Reported on 11/23/2018) 30 tablet 3 Not Taking at Unknown time   Allergies  Allergen Reactions  . Codeine Nausea Only  . Symproic [Naldemedine] Nausea And Vomiting    Social History   Tobacco Use  . Smoking status: Light Tobacco Smoker    Packs/day: 0.10    Years: 40.00    Pack years: 4.00    Types: Cigarettes  . Smokeless tobacco: Never Used  Substance Use Topics  . Alcohol use: Not Currently    Alcohol/week: 0.0 standard drinks    Family History  Problem Relation Age of Onset  . Hypertension Mother   . Heart disease Mother        She is unaware of the details  . Heart disease Father        Unaware of details  . Heart attack Brother   . Colon cancer Neg Hx   . Colon polyps Neg Hx   . Esophageal cancer Neg Hx   . Rectal cancer Neg Hx   . Stomach cancer Neg Hx      Review of Systems  Musculoskeletal: Positive for joint pain.       Left knee  All other systems reviewed and are negative.   Objective:  Physical Exam  Constitutional: She is oriented to person, place, and time. She appears well-developed and well-nourished.  HENT:  Head: Normocephalic and atraumatic.  Eyes: Pupils are equal, round, and reactive to light.  Neck: Normal range of motion.  Cardiovascular: Normal rate and regular rhythm.  Respiratory: Effort normal.  GI: Soft.  Musculoskeletal:     Comments: Left knee has moderate valgus deformity.  She has crepitation and motion from 0-110.  She has lateral joint line pain and 1+ effusion.  Hip motion is full and straight leg raise is negative.  Opposite knee has some well-healed scars.  Sensation and motor function are intact in both feet with palpable pulses on both sides.    Neurological: She is alert and oriented to person, place, and time.  Skin: Skin is warm and dry.  Psychiatric: She has a normal mood and affect. Her behavior is normal. Judgment and thought content  normal.    Vital signs in last 24 hours: BP: ()/()  Arterial Line BP: ()/()   Labs:   Estimated body mass index is 25.11 kg/m as calculated from the following:   Height as of 05/11/18: 5\' 6"  (1.676 m).   Weight as of 06/04/18: 70.6 kg.   Imaging Review Plain radiographs demonstrate severe degenerative joint disease of the left knee(s). The overall alignment isneutral. The bone quality appears to be good for age and reported activity level.      Assessment/Plan:  End stage primary arthritis, left knee   The patient history, physical examination, clinical judgment of the provider and imaging studies are consistent with end stage degenerative joint disease of the left knee(s) and total knee arthroplasty is deemed medically necessary. The treatment options including medical management, injection therapy arthroscopy and arthroplasty were discussed at length. The risks and benefits of total knee arthroplasty were presented and reviewed. The risks due to aseptic loosening, infection, stiffness, patella tracking problems, thromboembolic complications and other imponderables were discussed. The patient acknowledged the explanation, agreed to proceed with the plan and consent was signed. Patient is being admitted for inpatient treatment for surgery, pain control, PT, OT, prophylactic antibiotics, VTE prophylaxis, progressive ambulation and ADL's and discharge planning. The patient is planning to be discharged home with home health services     Patient's anticipated LOS is less than 2 midnights, meeting these requirements: - Younger than 95 - Lives within 1 hour of care - Has a competent adult at home to recover with post-op recover - NO history of  - Chronic pain requiring opiods  - Diabetes  - Coronary Artery Disease  - Heart failure  - Heart attack  - Stroke  - DVT/VTE  - Cardiac arrhythmia  - Respiratory Failure/COPD  - Renal failure  - Anemia  - Advanced Liver  disease

## 2018-11-25 NOTE — Telephone Encounter (Signed)
11/25/18 lmom to call back and set up echo and virtual appointment for Alexandra [name] for 12/03/18 - make the echo first

## 2018-11-26 ENCOUNTER — Other Ambulatory Visit: Payer: Self-pay

## 2018-11-26 ENCOUNTER — Ambulatory Visit (HOSPITAL_COMMUNITY)
Admission: RE | Admit: 2018-11-26 | Discharge: 2018-11-26 | Disposition: A | Discharge status: Home / Self Care | Payer: Medicare Other | Source: Ambulatory Visit | Attending: Orthopaedic Surgery | Admitting: Orthopaedic Surgery

## 2018-11-26 ENCOUNTER — Encounter (INDEPENDENT_AMBULATORY_CARE_PROVIDER_SITE_OTHER): Payer: Self-pay

## 2018-11-26 ENCOUNTER — Encounter (HOSPITAL_COMMUNITY): Payer: Self-pay

## 2018-11-26 ENCOUNTER — Encounter (HOSPITAL_COMMUNITY)
Admission: RE | Admit: 2018-11-26 | Discharge: 2018-11-26 | Disposition: A | Discharge status: Home / Self Care | Payer: Medicare Other | Source: Ambulatory Visit | Attending: Orthopaedic Surgery | Admitting: Orthopaedic Surgery

## 2018-11-26 DIAGNOSIS — F17210 Nicotine dependence, cigarettes, uncomplicated: Secondary | ICD-10-CM | POA: Diagnosis not present

## 2018-11-26 DIAGNOSIS — Z7951 Long term (current) use of inhaled steroids: Secondary | ICD-10-CM | POA: Insufficient documentation

## 2018-11-26 DIAGNOSIS — J449 Chronic obstructive pulmonary disease, unspecified: Secondary | ICD-10-CM | POA: Insufficient documentation

## 2018-11-26 DIAGNOSIS — I2510 Atherosclerotic heart disease of native coronary artery without angina pectoris: Secondary | ICD-10-CM | POA: Insufficient documentation

## 2018-11-26 DIAGNOSIS — M1712 Unilateral primary osteoarthritis, left knee: Secondary | ICD-10-CM | POA: Diagnosis not present

## 2018-11-26 DIAGNOSIS — Z7982 Long term (current) use of aspirin: Secondary | ICD-10-CM | POA: Diagnosis not present

## 2018-11-26 DIAGNOSIS — Z01818 Encounter for other preprocedural examination: Secondary | ICD-10-CM

## 2018-11-26 DIAGNOSIS — K219 Gastro-esophageal reflux disease without esophagitis: Secondary | ICD-10-CM | POA: Insufficient documentation

## 2018-11-26 DIAGNOSIS — I10 Essential (primary) hypertension: Secondary | ICD-10-CM | POA: Insufficient documentation

## 2018-11-26 DIAGNOSIS — E785 Hyperlipidemia, unspecified: Secondary | ICD-10-CM | POA: Diagnosis not present

## 2018-11-26 DIAGNOSIS — Z79899 Other long term (current) drug therapy: Secondary | ICD-10-CM | POA: Insufficient documentation

## 2018-11-26 DIAGNOSIS — Z7902 Long term (current) use of antithrombotics/antiplatelets: Secondary | ICD-10-CM | POA: Insufficient documentation

## 2018-11-26 HISTORY — DX: Unspecified osteoarthritis, unspecified site: M19.90

## 2018-11-26 LAB — CBC WITH DIFFERENTIAL/PLATELET
Abs Immature Granulocytes: 0.03 10*3/uL (ref 0.00–0.07)
Basophils Absolute: 0.1 10*3/uL (ref 0.0–0.1)
Basophils Relative: 1 %
Eosinophils Absolute: 0.5 10*3/uL (ref 0.0–0.5)
Eosinophils Relative: 5 %
HCT: 40.6 % (ref 36.0–46.0)
Hemoglobin: 12.8 g/dL (ref 12.0–15.0)
Immature Granulocytes: 0 %
Lymphocytes Relative: 26 %
Lymphs Abs: 2.4 10*3/uL (ref 0.7–4.0)
MCH: 29.8 pg (ref 26.0–34.0)
MCHC: 31.5 g/dL (ref 30.0–36.0)
MCV: 94.4 fL (ref 80.0–100.0)
Monocytes Absolute: 0.8 10*3/uL (ref 0.1–1.0)
Monocytes Relative: 8 %
Neutro Abs: 5.3 10*3/uL (ref 1.7–7.7)
Neutrophils Relative %: 60 %
Platelets: 186 10*3/uL (ref 150–400)
RBC: 4.3 MIL/uL (ref 3.87–5.11)
RDW: 13.2 % (ref 11.5–15.5)
WBC: 9 10*3/uL (ref 4.0–10.5)
nRBC: 0 % (ref 0.0–0.2)

## 2018-11-26 LAB — URINALYSIS, ROUTINE W REFLEX MICROSCOPIC
Bilirubin Urine: NEGATIVE
Glucose, UA: NEGATIVE mg/dL
Hgb urine dipstick: NEGATIVE
Ketones, ur: NEGATIVE mg/dL
Nitrite: NEGATIVE
Protein, ur: NEGATIVE mg/dL
Specific Gravity, Urine: 1.009 (ref 1.005–1.030)
pH: 6 (ref 5.0–8.0)

## 2018-11-26 LAB — PROTIME-INR
INR: 1 (ref 0.8–1.2)
Prothrombin Time: 13.1 seconds (ref 11.4–15.2)

## 2018-11-26 LAB — BASIC METABOLIC PANEL
Anion gap: 10 (ref 5–15)
BUN: 19 mg/dL (ref 8–23)
CO2: 25 mmol/L (ref 22–32)
Calcium: 9.2 mg/dL (ref 8.9–10.3)
Chloride: 102 mmol/L (ref 98–111)
Creatinine, Ser: 1.15 mg/dL — ABNORMAL HIGH (ref 0.44–1.00)
GFR calc Af Amer: 56 mL/min — ABNORMAL LOW (ref >60)
GFR calc non Af Amer: 49 mL/min — ABNORMAL LOW (ref >60)
Glucose, Bld: 91 mg/dL (ref 70–99)
Potassium: 4.1 mmol/L (ref 3.5–5.1)
Sodium: 137 mmol/L (ref 135–145)

## 2018-11-26 LAB — SURGICAL PCR SCREEN
MRSA, PCR: NEGATIVE
Staphylococcus aureus: NEGATIVE

## 2018-11-26 LAB — APTT: aPTT: 33 seconds (ref 24–36)

## 2018-11-26 NOTE — Progress Notes (Signed)
PCP -Dr. Maryella Shivers [facility] [place], [place]  Cardiologist - Dr. Ellyn Hack Dr. Aundra Dubin (HF clinic)  Chest x-ray - 09/03/20220 EKG - 03/05/2018 Stress Test - N/A ECHO - 02/04/2018 Cardiac Cath - 11/20/2017- Right/Left heart cath  Sleep Study - N/A CPAP - N/A  Fasting Blood Sugar -N/A  Checks Blood Sugar _____ times a day  Blood Thinner Instructions: Aspirin is prescribed for heart  Aspirin Instructions:Aspirin 81 mg daily  Last Dose:Patient instructed to call Dr. Allison Quarry office and get instructions from them as to when she needs to stop the Aspirin.  Anesthesia review:   Patient denies shortness of breath, fever, cough and chest pain at PAT appointment   Patient verbalized understanding of instructions that were given to them at the PAT appointment. Patient was also instructed that they will need to review over the PAT instructions again at home before surgery.

## 2018-11-27 ENCOUNTER — Other Ambulatory Visit (HOSPITAL_COMMUNITY): Payer: Self-pay | Admitting: *Deleted

## 2018-11-27 ENCOUNTER — Other Ambulatory Visit (HOSPITAL_COMMUNITY)
Admission: RE | Admit: 2018-11-27 | Discharge: 2018-11-27 | Disposition: A | Discharge status: Home / Self Care | Payer: Medicare Other | Source: Ambulatory Visit | Attending: Orthopaedic Surgery | Admitting: Orthopaedic Surgery

## 2018-11-27 DIAGNOSIS — Z20828 Contact with and (suspected) exposure to other viral communicable diseases: Secondary | ICD-10-CM | POA: Insufficient documentation

## 2018-11-27 DIAGNOSIS — M1712 Unilateral primary osteoarthritis, left knee: Secondary | ICD-10-CM | POA: Insufficient documentation

## 2018-11-27 DIAGNOSIS — Z01812 Encounter for preprocedural laboratory examination: Secondary | ICD-10-CM | POA: Diagnosis present

## 2018-11-27 NOTE — Progress Notes (Signed)
Anesthesia Chart Review   Case: [id number] Date/Time: 12/01/18 1157   Procedure: Left Knee Arthroplasty (Left Knee)   Anesthesia type: Spinal   Pre-op diagnosis: Left Knee Degenerative Joint Disease   Location: [place] [facility] / [facility]   Surgeon: Melrose Nakayama, MD      DISCUSSION:68 y.o. light tobacco smoker (4 pack years) with h/o HTN, GERD, HLD, CAD, COPD, s/p TAVR 12/30/17, left knee DJD scheduled for above procedure 12/01/2018 with Melrose Nakayama.   Pt last seen by cardiologist, Dr. Glenetta Hew, 05/13/2018.  Per OV note, "Elyzabeth is now status post TAVR with notably improved aortic valve gradients.  This has notably improved her diastolic dysfunction making her lower risk than she had previously been for any particular surgery.  She has nonobstructive CAD by cath.  At this point, she is on Plavix post TAVR for 6 months.  By mid April she should be okay holding Plavix 5 to 7 days preop.  She would not require any further cardiac evaluation as she is quite asymptomatic from a cardiac standpoint at this time.  More concerns would be based on her pulmonary issues. She would be a low risk patient for a low risk operation."  Last seen by PCP 11/10/2018.  Stable at this visit.  Per OV note, "Left knee arthroplasty with spinal anesthesia planned. She is medically cleared, but needs cardiology clearance for general anesthesia."  Anticipate pt can proceed with planned procedure barring acute status change.   VS: BP 128/65   Pulse 60   Temp 36.8 C (Oral)   Resp 18   Ht 5\' 5"  (1.651 m)   Wt 71.3 kg   SpO2 99%   BMI 26.15 kg/m   PROVIDERS: [facility]  Glenetta Hew, MD, is Cardiologist  LABS: Labs reviewed: Acceptable for surgery. (all labs ordered are listed, but only abnormal results are displayed)  Labs Reviewed  BASIC METABOLIC PANEL - Abnormal; Notable for the following components:      Result Value   Creatinine, Ser 1.15 (*)    GFR calc non Af Amer 49  (*)    GFR calc Af Amer 56 (*)    All other components within normal limits  URINALYSIS, ROUTINE W REFLEX MICROSCOPIC - Abnormal; Notable for the following components:   Leukocytes,Ua TRACE (*)    Bacteria, UA RARE (*)    All other components within normal limits  SURGICAL PCR SCREEN  APTT  CBC WITH DIFFERENTIAL/PLATELET  PROTIME-INR     IMAGES: Chest Xray 11/26/2018 FINDINGS: The heart size and mediastinal contours are within normal limits. Cardiac vascular stent unchanged. Calcific aortic knob. Both lungs are clear. The visualized skeletal structures are unremarkable.  IMPRESSION: No active cardiopulmonary disease.  EKG:   CV: Echo 02/04/18 Study Conclusions  - Left ventricle: The cavity size was normal. There was mild focal   basal hypertrophy of the septum. Systolic function was normal.   The estimated ejection fraction was in the range of 55% to 60%.   Wall motion was normal; there were no regional wall motion   abnormalities. Doppler parameters are consistent with abnormal   left ventricular relaxation (grade 1 diastolic dysfunction). - Aortic valve: A prosthesis was present and functioning normally.   The prosthesis had a normal range of motion. The sewing ring   appeared normal, had no rocking motion, and showed no evidence of   dehiscence. There was mild stenosis. Mean gradient (S): 19 mm Hg.   Peak gradient (S):  36 mm Hg. - Mitral valve: Calcified annulus. There was mild to moderate   regurgitation. - Left atrium: The atrium was severely dilated. - Right ventricle: Systolic function was normal. - Atrial septum: No defect or patent foramen ovale was identified. - Tricuspid valve: There was trivial regurgitation. - Pulmonic valve: There was no significant regurgitation. - Pulmonary arteries: PA peak pressure: 33 mm Hg (S).  Impressions:  - S/P TAVR. Valve mean gradient has gone from 7 (procedure)->25->19   mmHg and peak gradient from 16 (procedure) ->45  -> 36 mmHg.   Otherwise study largely unchanged from prior.  Cardiac Cath 11/20/2017 1. Low filling pressures.  2. Mild pulmonary hypertension, suspect group 3 PH due to COPD.  PVR only 3.78 WU.  3. Nonobstructive CAD.  Past Medical History:  Diagnosis Date  . Arthritis   . COPD (chronic obstructive pulmonary disease) ([place]) 05/22/2016  . Coronary artery disease, non-occlusive 11/28/2016   R&LHC: 65% distal LAD lesion -- likely not angiographically significant->medical management. Normal LVEF. Moderately elevated LVEDP. I aortic valve gradient in the Cath Lab, moderate aortic stenosis. This would suggest that the stenosis is on the moderate side of moderate to severe.  Severe pulmonary hypertension (likely mixed).  AVA 0.96 cm; P-P gradient ~ 30 mmHg, Mean ~24.5-27.5 (Moderate AS)   . GERD (gastroesophageal reflux disease)   . Heart murmur   . Hx of adenomatous colonic polyps 10/19/2015  . Hyperlipidemia   . Hypertension   . Moderate mitral regurgitation by prior echocardiography 10/2016   Echocardiogram revealed moderate mitral regurgitation  . Pulmonary hypertension associated Aortic Stenosis, Mitral Regurgitation & COPD    Peak PAP by Echo ~54 mmHg;  by Cath PAP/Mean 71/32 mmHg, 53 mmHg (with PCWP & LVEDP ~24-26 mmHg); TPG ~26 mmHg  . Pulmonary nodules   . S/P TAVR (transcatheter aortic valve replacement)   . Severe aortic stenosis     Past Surgical History:  Procedure Laterality Date  . APPENDECTOMY    . BREAST CYST EXCISION Right 1970  . CARDIAC VALVE REPLACEMENT    . CATARACT EXTRACTION W/ INTRAOCULAR LENS IMPLANT Right   . CHOLECYSTECTOMY OPEN    . COLONOSCOPY  2003, 2017  . CTA Chest  09/2016   Thoracic Aortic Ca2+ w/o dilation.  Coronary Calcification noted. PA normal. Mild Emphysematous changes w/ mild LLL scarring.    [name] FRACTURE SURGERY     2007 -right tib /fib fracture ----07/2009 right femur fx after a fall  . FRACTURE SURGERY Right 2011   Femur  . PERIPHERAL VASCULAR  CATHETERIZATION N/A 08/09/2014   Procedure: Aortic Arch Angiography;  Surgeon: Serafina Mitchell, MD;  Location: [place] [facility];  Service: Cardiovascular: Type 1 Arch. No significant stenosis, aneurysmal degeneration or dissection.  No luminal irregularity seen in R or L SubClavian, Brachial or Radial A. Chronic distal ulnar A occlusion Bilatera.  [name] PERIPHERAL VASCULAR CATHETERIZATION Right 08/09/2014   Procedure: Upper Extremity Angiography;  Surgeon: Serafina Mitchell, MD;  Location: [place] [facility];  Service: Cardiovascular;  Laterality: Right;  . RIGHT/LEFT HEART CATH AND CORONARY ANGIOGRAPHY N/A 12/06/2016   Procedure: RIGHT/LEFT HEART CATH AND CORONARY ANGIOGRAPHY;  Surgeon: Leonie Man, MD;  [facility]: Cor Angio: 65% dLAD (Med Rx). AVA 0.96 cm; P-P gradient ~ 30 mmHg, Mean ~24.5-27.5 (Mod AS); RHC #s: RAP 8 mmHg, RVP/EDP: 71/8/15 mmHg, PCWP: 21-24 mmHg, PAP/mean: 71/32/52 mmHg = Severe Mixed Pulmonary HTN; LVP/EDP 205/17/26 mmHg ; CO/CI by Fick: 4.35, 2.44 -  mildly reduced  . RIGHT/LEFT HEART CATH AND CORONARY ANGIOGRAPHY N/A 11/20/2017   Procedure: RIGHT/LEFT HEART CATH AND CORONARY ANGIOGRAPHY;  Surgeon: Larey Dresser, MD;  Location: [place] [facility];  Service: Cardiovascular;  Laterality: N/A;  . TEE WITHOUT CARDIOVERSION N/A 12/30/2017   Procedure: TRANSESOPHAGEAL ECHOCARDIOGRAM (TEE);  Surgeon: Burnell Blanks, MD;  Location: [place];  Service: Open Heart Surgery;  Laterality: N/A;  . TRANSCATHETER AORTIC VALVE REPLACEMENT, TRANSFEMORAL  12/30/2017  . TRANSCATHETER AORTIC VALVE REPLACEMENT, TRANSFEMORAL N/A 12/30/2017   Procedure: TRANSCATHETER AORTIC VALVE REPLACEMENT, TRANSFEMORAL;  Surgeon: Burnell Blanks, MD;  Location: [place];  Service: Open Heart Surgery;  Laterality: N/A;  . TRANSTHORACIC ECHOCARDIOGRAM  10/30/2016   Mild Concentric LVH. EF 55-60%. No RWMA, Gr 2 DD. Mod-Severe AS (mean-peak Gradient 31 mmHG - 64 mmHg), Mod MR. Mod LA dilation,  Mod PA HTN - peak pressure ~54 mmHg). = Progression of AS from 2014  . ULTRASOUND GUIDANCE FOR VASCULAR ACCESS  08/09/2014   Procedure: Ultrasound Guidance For Vascular Access;  Surgeon: Serafina Mitchell, MD;  Location: [place] [facility];  Service: Cardiovascular;;    MEDICATIONS: . amLODipine (NORVASC) 10 MG tablet  . amoxicillin (AMOXIL) 500 MG tablet  . aspirin 81 MG tablet  . Azelastine HCl 0.15 % SOLN  . bisoprolol-hydrochlorothiazide (ZIAC) 10-6.25 MG tablet  . clopidogrel (PLAVIX) 75 MG tablet  . fluticasone (FLOVENT HFA) 220 MCG/ACT inhaler  . furosemide (LASIX) 20 MG tablet  . losartan (COZAAR) 100 MG tablet  . montelukast (SINGULAIR) 10 MG tablet  . oxybutynin (DITROPAN-XL) 10 MG 24 hr tablet  . oxyCODONE-acetaminophen (PERCOCET) 10-325 MG tablet  . potassium chloride SA (K-DUR,KLOR-CON) 20 MEQ tablet  . rosuvastatin (CRESTOR) 10 MG tablet  . Vitamin D, Ergocalciferol, (DRISDOL) 1.25 MG (50000 UT) CAPS capsule   No current facility-administered medications for this encounter.      Maia Plan [facility] Pre-Surgical Testing [phone] 11/27/18  11:22 AM

## 2018-11-28 LAB — NOVEL CORONAVIRUS, NAA (HOSP ORDER, SEND-OUT TO REF LAB; TAT 18-24 HRS): SARS-CoV-2, NAA: NOT DETECTED

## 2018-11-30 MED ORDER — BUPIVACAINE LIPOSOME 1.3 % IJ SUSP
20.0000 mL | INTRAMUSCULAR | Status: AC
  Filled 2018-11-30: qty 20

## 2018-11-30 MED ORDER — TRANEXAMIC ACID 1000 MG/10ML IV SOLN
2000.0000 mg | INTRAVENOUS | Status: AC
  Filled 2018-11-30: qty 20

## 2018-11-30 NOTE — Anesthesia Preprocedure Evaluation (Addendum)
Anesthesia Evaluation    Reviewed: Allergy & Precautions, Patient's Chart, lab work & pertinent test results  Airway Mallampati: II  TM Distance: >3 FB Neck ROM: Full    Dental no notable dental hx.    Pulmonary COPD, Current Smoker and Patient abstained from smoking.,    Pulmonary exam normal breath sounds clear to auscultation       Cardiovascular Exercise Tolerance: Good hypertension, Pt. on medications + CAD   Rhythm:Regular Rate:Normal + Systolic murmurs [id number] Echo  Left ventricle: The cavity size was normal. There was mild focal   basal hypertrophy of the septum. Systolic function was normal.   The estimated ejection fraction was in the range of 55% to 60%.   Wall motion was normal; there were no regional wall motion   abnormalities. Doppler parameters are consistent with abnormal   left ventricular relaxation (grade 1 diastolic dysfunction).   Neuro/Psych negative neurological ROS  negative psych ROS   GI/Hepatic Neg liver ROS, GERD  ,  Endo/Other  negative endocrine ROS  Renal/GU negative Renal ROS     Musculoskeletal negative musculoskeletal ROS (+)   Abdominal   Peds  Hematology negative hematology ROS (+)   Anesthesia Other Findings   Reproductive/Obstetrics negative OB ROS                            Lab Results  Component Value Date   WBC 9.0 11/26/2018   HGB 12.8 11/26/2018   HCT 40.6 11/26/2018   MCV 94.4 11/26/2018   PLT 186 11/26/2018   Lab Results  Component Value Date   CREATININE 1.15 (H) 11/26/2018   BUN 19 11/26/2018   NA 137 11/26/2018   K 4.1 11/26/2018   CL 102 11/26/2018   CO2 25 11/26/2018     Anesthesia Physical Anesthesia Plan  ASA: III  Anesthesia Plan: Spinal   Post-op Pain Management:  Regional for Post-op pain   Induction:   PONV Risk Score and Plan: 3 and Treatment may vary due to age or medical condition and  Ondansetron  Airway Management Planned: Natural Airway and Nasal Cannula  Additional Equipment: None  Intra-op Plan:   Post-operative Plan:   Informed Consent:     Dental advisory given  Plan Discussed with: Surgeon  Anesthesia Plan Comments: (check last plavixw L adductor canal Spiunal vs GA)       Anesthesia Quick Evaluation

## 2018-12-01 ENCOUNTER — Ambulatory Visit (HOSPITAL_COMMUNITY): Payer: Medicare Other | Admitting: Certified Registered"

## 2018-12-01 ENCOUNTER — Observation Stay (HOSPITAL_COMMUNITY)
Admission: RE | Admit: 2018-12-01 | Discharge: 2018-12-02 | Disposition: A | Discharge status: Home / Self Care | Payer: Medicare Other | Attending: Orthopaedic Surgery | Admitting: Orthopaedic Surgery

## 2018-12-01 ENCOUNTER — Other Ambulatory Visit: Payer: Self-pay

## 2018-12-01 ENCOUNTER — Ambulatory Visit (HOSPITAL_COMMUNITY): Payer: Medicare Other | Admitting: Physician Assistant

## 2018-12-01 ENCOUNTER — Other Ambulatory Visit: Payer: Self-pay | Admitting: Physician Assistant

## 2018-12-01 ENCOUNTER — Telehealth (HOSPITAL_COMMUNITY): Payer: Self-pay | Admitting: *Deleted

## 2018-12-01 ENCOUNTER — Encounter (HOSPITAL_COMMUNITY): Payer: Self-pay

## 2018-12-01 ENCOUNTER — Encounter (HOSPITAL_COMMUNITY)
Admission: RE | Disposition: A | Discharge status: Home / Self Care | Payer: Self-pay | Source: Home / Self Care | Attending: Orthopaedic Surgery

## 2018-12-01 DIAGNOSIS — Z952 Presence of prosthetic heart valve: Secondary | ICD-10-CM | POA: Diagnosis not present

## 2018-12-01 DIAGNOSIS — M1712 Unilateral primary osteoarthritis, left knee: Principal | ICD-10-CM | POA: Insufficient documentation

## 2018-12-01 DIAGNOSIS — G894 Chronic pain syndrome: Secondary | ICD-10-CM | POA: Insufficient documentation

## 2018-12-01 DIAGNOSIS — E785 Hyperlipidemia, unspecified: Secondary | ICD-10-CM | POA: Diagnosis not present

## 2018-12-01 DIAGNOSIS — Z23 Encounter for immunization: Secondary | ICD-10-CM | POA: Diagnosis not present

## 2018-12-01 DIAGNOSIS — I272 Pulmonary hypertension, unspecified: Secondary | ICD-10-CM | POA: Diagnosis not present

## 2018-12-01 DIAGNOSIS — I110 Hypertensive heart disease with heart failure: Secondary | ICD-10-CM | POA: Diagnosis not present

## 2018-12-01 DIAGNOSIS — I2510 Atherosclerotic heart disease of native coronary artery without angina pectoris: Secondary | ICD-10-CM | POA: Insufficient documentation

## 2018-12-01 DIAGNOSIS — F17210 Nicotine dependence, cigarettes, uncomplicated: Secondary | ICD-10-CM | POA: Insufficient documentation

## 2018-12-01 DIAGNOSIS — I5032 Chronic diastolic (congestive) heart failure: Secondary | ICD-10-CM | POA: Diagnosis not present

## 2018-12-01 DIAGNOSIS — E559 Vitamin D deficiency, unspecified: Secondary | ICD-10-CM | POA: Insufficient documentation

## 2018-12-01 DIAGNOSIS — Z7982 Long term (current) use of aspirin: Secondary | ICD-10-CM | POA: Diagnosis not present

## 2018-12-01 DIAGNOSIS — J449 Chronic obstructive pulmonary disease, unspecified: Secondary | ICD-10-CM | POA: Insufficient documentation

## 2018-12-01 DIAGNOSIS — Z79899 Other long term (current) drug therapy: Secondary | ICD-10-CM | POA: Diagnosis not present

## 2018-12-01 HISTORY — PX: TOTAL KNEE ARTHROPLASTY: SHX125

## 2018-12-01 LAB — ABO/RH: ABO/RH(D): A NEG

## 2018-12-01 LAB — TYPE AND SCREEN
ABO/RH(D): A NEG
Antibody Screen: NEGATIVE
Weak D: POSITIVE

## 2018-12-01 SURGERY — ARTHROPLASTY, KNEE, TOTAL
Anesthesia: Spinal | Site: Knee | Laterality: Left

## 2018-12-01 MED ORDER — ACETAMINOPHEN 325 MG PO TABS: 325.0000 mg | ORAL_TABLET | Freq: Four times a day (QID) | ORAL | Status: DC | PRN

## 2018-12-01 MED ORDER — ASPIRIN 81 MG PO CHEW
81.0000 mg | CHEWABLE_TABLET | Freq: Two times a day (BID) | ORAL | Status: DC
  Administered 2018-12-02: 08:00:00 81 mg via ORAL
  Filled 2018-12-01: qty 1

## 2018-12-01 MED ORDER — ROPIVACAINE HCL 5 MG/ML IJ SOLN
INTRAMUSCULAR | Status: DC | PRN
  Administered 2018-12-01: 20 mL via PERINEURAL

## 2018-12-01 MED ORDER — METHOCARBAMOL 500 MG PO TABS
500.0000 mg | ORAL_TABLET | Freq: Four times a day (QID) | ORAL | Status: DC | PRN
  Administered 2018-12-01 – 2018-12-02 (×2): 500 mg via ORAL
  Filled 2018-12-01 (×2): qty 1

## 2018-12-01 MED ORDER — BUPIVACAINE IN DEXTROSE 0.75-8.25 % IT SOLN
INTRATHECAL | Status: DC | PRN
  Administered 2018-12-01: 1.6 mL via INTRATHECAL

## 2018-12-01 MED ORDER — LOSARTAN POTASSIUM 50 MG PO TABS
100.0000 mg | ORAL_TABLET | Freq: Every day | ORAL | Status: DC
  Administered 2018-12-02: 100 mg via ORAL
  Filled 2018-12-01: qty 2

## 2018-12-01 MED ORDER — MIDAZOLAM HCL 2 MG/2ML IJ SOLN
INTRAMUSCULAR | Status: AC
  Filled 2018-12-01: qty 2

## 2018-12-01 MED ORDER — BISACODYL 5 MG PO TBEC: 5.0000 mg | DELAYED_RELEASE_TABLET | Freq: Every day | ORAL | Status: DC | PRN

## 2018-12-01 MED ORDER — TRANEXAMIC ACID 1000 MG/10ML IV SOLN
INTRAVENOUS | Status: DC | PRN
  Administered 2018-12-01: 12:00:00 2000 mg via TOPICAL

## 2018-12-01 MED ORDER — CHLORHEXIDINE GLUCONATE 4 % EX LIQD: 60.0000 mL | Freq: Once | CUTANEOUS | Status: DC

## 2018-12-01 MED ORDER — METOCLOPRAMIDE HCL 5 MG/ML IJ SOLN
5.0000 mg | Freq: Three times a day (TID) | INTRAMUSCULAR | Status: DC | PRN
  Administered 2018-12-02: 08:00:00 10 mg via INTRAVENOUS
  Filled 2018-12-01: qty 2

## 2018-12-01 MED ORDER — BISOPROLOL FUMARATE 10 MG PO TABS
10.0000 mg | ORAL_TABLET | Freq: Once | ORAL | Status: DC
  Filled 2018-12-01: qty 1

## 2018-12-01 MED ORDER — OXYCODONE HCL 5 MG PO TABS
5.0000 mg | ORAL_TABLET | ORAL | Status: DC | PRN
  Administered 2018-12-02: 13:00:00 10 mg via ORAL
  Filled 2018-12-01: qty 2

## 2018-12-01 MED ORDER — LACTATED RINGERS IV SOLN
INTRAVENOUS | Status: DC
  Administered 2018-12-01: 10:00:00 via INTRAVENOUS

## 2018-12-01 MED ORDER — STERILE WATER FOR IRRIGATION IR SOLN
Status: DC | PRN
  Administered 2018-12-01 (×2): 1000 mL

## 2018-12-01 MED ORDER — TRANEXAMIC ACID-NACL 1000-0.7 MG/100ML-% IV SOLN
1000.0000 mg | INTRAVENOUS | Status: AC
  Administered 2018-12-01: 1000 mg via INTRAVENOUS
  Filled 2018-12-01: qty 100

## 2018-12-01 MED ORDER — ONDANSETRON HCL 4 MG/2ML IJ SOLN
INTRAMUSCULAR | Status: DC | PRN
  Administered 2018-12-01: 4 mg via INTRAVENOUS

## 2018-12-01 MED ORDER — ALUM & MAG HYDROXIDE-SIMETH 200-200-20 MG/5ML PO SUSP: 30.0000 mL | ORAL | Status: DC | PRN

## 2018-12-01 MED ORDER — MIDAZOLAM HCL 2 MG/2ML IJ SOLN
1.0000 mg | INTRAMUSCULAR | Status: DC
  Administered 2018-12-01: 1 mg via INTRAVENOUS
  Filled 2018-12-01: qty 2

## 2018-12-01 MED ORDER — HYDROMORPHONE HCL 1 MG/ML IJ SOLN
0.5000 mg | INTRAMUSCULAR | Status: DC | PRN
  Administered 2018-12-01 (×2): 1 mg via INTRAVENOUS
  Filled 2018-12-01 (×2): qty 1

## 2018-12-01 MED ORDER — FENTANYL CITRATE (PF) 100 MCG/2ML IJ SOLN: 25.0000 ug | INTRAMUSCULAR | Status: DC | PRN

## 2018-12-01 MED ORDER — INFLUENZA VAC A&B SA ADJ QUAD 0.5 ML IM PRSY
0.5000 mL | PREFILLED_SYRINGE | INTRAMUSCULAR | Status: AC
  Administered 2018-12-02: 0.5 mL via INTRAMUSCULAR
  Filled 2018-12-01: qty 0.5

## 2018-12-01 MED ORDER — OXYBUTYNIN CHLORIDE ER 5 MG PO TB24: 10.0000 mg | ORAL_TABLET | Freq: Every evening | ORAL | Status: DC | PRN

## 2018-12-01 MED ORDER — BUPIVACAINE LIPOSOME 1.3 % IJ SUSP
INTRAMUSCULAR | Status: DC | PRN
  Administered 2018-12-01: 20 mL

## 2018-12-01 MED ORDER — LACTATED RINGERS IV SOLN
INTRAVENOUS | Status: DC
  Administered 2018-12-01: 15:00:00 via INTRAVENOUS

## 2018-12-01 MED ORDER — ONDANSETRON HCL 4 MG/2ML IJ SOLN
4.0000 mg | Freq: Four times a day (QID) | INTRAMUSCULAR | Status: DC | PRN
  Administered 2018-12-02: 4 mg via INTRAVENOUS
  Filled 2018-12-01: qty 2

## 2018-12-01 MED ORDER — SODIUM CHLORIDE 0.9 % IR SOLN
Status: DC | PRN
  Administered 2018-12-01: 1000 mL

## 2018-12-01 MED ORDER — PHENOL 1.4 % MT LIQD
1.0000 | OROMUCOSAL | Status: DC | PRN
  Filled 2018-12-01: qty 177

## 2018-12-01 MED ORDER — ROSUVASTATIN CALCIUM 10 MG PO TABS
10.0000 mg | ORAL_TABLET | Freq: Every day | ORAL | Status: DC
  Administered 2018-12-01: 10 mg via ORAL
  Filled 2018-12-01: qty 1

## 2018-12-01 MED ORDER — BUDESONIDE 0.5 MG/2ML IN SUSP: 1.0000 mg | Freq: Two times a day (BID) | RESPIRATORY_TRACT | Status: DC | PRN

## 2018-12-01 MED ORDER — BUPIVACAINE-EPINEPHRINE (PF) 0.5% -1:200000 IJ SOLN
INTRAMUSCULAR | Status: AC
  Filled 2018-12-01: qty 30

## 2018-12-01 MED ORDER — ONDANSETRON HCL 4 MG/2ML IJ SOLN
INTRAMUSCULAR | Status: AC
  Filled 2018-12-01: qty 2

## 2018-12-01 MED ORDER — DOCUSATE SODIUM 100 MG PO CAPS
100.0000 mg | ORAL_CAPSULE | Freq: Two times a day (BID) | ORAL | Status: DC
  Administered 2018-12-01 – 2018-12-02 (×2): 100 mg via ORAL
  Filled 2018-12-01 (×2): qty 1

## 2018-12-01 MED ORDER — SODIUM CHLORIDE (PF) 0.9 % IJ SOLN
INTRAMUSCULAR | Status: AC
  Filled 2018-12-01: qty 50

## 2018-12-01 MED ORDER — PROPOFOL 10 MG/ML IV BOLUS
INTRAVENOUS | Status: AC
  Filled 2018-12-01: qty 20

## 2018-12-01 MED ORDER — CEFAZOLIN SODIUM-DEXTROSE 2-4 GM/100ML-% IV SOLN
2.0000 g | Freq: Four times a day (QID) | INTRAVENOUS | Status: AC
  Administered 2018-12-01 (×2): 2 g via INTRAVENOUS
  Filled 2018-12-01 (×2): qty 100

## 2018-12-01 MED ORDER — MIDAZOLAM HCL 2 MG/2ML IJ SOLN
INTRAMUSCULAR | Status: DC | PRN
  Administered 2018-12-01 (×2): 1 mg via INTRAVENOUS

## 2018-12-01 MED ORDER — FUROSEMIDE 20 MG PO TABS
20.0000 mg | ORAL_TABLET | Freq: Every day | ORAL | Status: DC
  Administered 2018-12-02: 20 mg via ORAL
  Filled 2018-12-01: qty 1

## 2018-12-01 MED ORDER — METHOCARBAMOL 500 MG IVPB - SIMPLE MED
500.0000 mg | Freq: Four times a day (QID) | INTRAVENOUS | Status: DC | PRN
  Administered 2018-12-01: 14:00:00 500 mg via INTRAVENOUS
  Filled 2018-12-01: qty 50

## 2018-12-01 MED ORDER — ONDANSETRON HCL 4 MG PO TABS: 4.0000 mg | ORAL_TABLET | Freq: Four times a day (QID) | ORAL | Status: DC | PRN

## 2018-12-01 MED ORDER — SODIUM CHLORIDE 0.9 % IR SOLN
Status: DC | PRN
  Administered 2018-12-01: 3000 mL

## 2018-12-01 MED ORDER — DEXAMETHASONE SODIUM PHOSPHATE 10 MG/ML IJ SOLN
INTRAMUSCULAR | Status: DC | PRN
  Administered 2018-12-01: 8 mg via INTRAVENOUS

## 2018-12-01 MED ORDER — DIPHENHYDRAMINE HCL 12.5 MG/5ML PO ELIX: 12.5000 mg | ORAL_SOLUTION | ORAL | Status: DC | PRN

## 2018-12-01 MED ORDER — BUPIVACAINE-EPINEPHRINE 0.5% -1:200000 IJ SOLN
INTRAMUSCULAR | Status: DC | PRN
  Administered 2018-12-01: 30 mL

## 2018-12-01 MED ORDER — CEFAZOLIN SODIUM-DEXTROSE 2-4 GM/100ML-% IV SOLN
2.0000 g | INTRAVENOUS | Status: AC
  Administered 2018-12-01: 12:00:00 2 g via INTRAVENOUS
  Filled 2018-12-01: qty 100

## 2018-12-01 MED ORDER — OXYCODONE HCL 5 MG PO TABS
10.0000 mg | ORAL_TABLET | ORAL | Status: DC | PRN
  Administered 2018-12-01 – 2018-12-02 (×2): 15 mg via ORAL
  Filled 2018-12-01 (×2): qty 3

## 2018-12-01 MED ORDER — BISOPROLOL-HYDROCHLOROTHIAZIDE 10-6.25 MG PO TABS
1.0000 | ORAL_TABLET | Freq: Every day | ORAL | Status: DC
  Administered 2018-12-02: 10:00:00 1 via ORAL
  Filled 2018-12-01: qty 1

## 2018-12-01 MED ORDER — FENTANYL CITRATE (PF) 100 MCG/2ML IJ SOLN
INTRAMUSCULAR | Status: AC
  Filled 2018-12-01: qty 2

## 2018-12-01 MED ORDER — METHOCARBAMOL 500 MG IVPB - SIMPLE MED
INTRAVENOUS | Status: AC
  Filled 2018-12-01: qty 50

## 2018-12-01 MED ORDER — MENTHOL 3 MG MT LOZG: 1.0000 | LOZENGE | OROMUCOSAL | Status: DC | PRN

## 2018-12-01 MED ORDER — SODIUM CHLORIDE (PF) 0.9 % IJ SOLN
INTRAMUSCULAR | Status: DC | PRN
  Administered 2018-12-01: 30 mL

## 2018-12-01 MED ORDER — FENTANYL CITRATE (PF) 100 MCG/2ML IJ SOLN
50.0000 ug | INTRAMUSCULAR | Status: DC
  Administered 2018-12-01 (×2): 50 ug via INTRAVENOUS
  Filled 2018-12-01: qty 2

## 2018-12-01 MED ORDER — PROPOFOL 10 MG/ML IV BOLUS
INTRAVENOUS | Status: AC
  Filled 2018-12-01: qty 40

## 2018-12-01 MED ORDER — EPHEDRINE SULFATE-NACL 50-0.9 MG/10ML-% IV SOSY
PREFILLED_SYRINGE | INTRAVENOUS | Status: DC | PRN
  Administered 2018-12-01: 5 mg via INTRAVENOUS
  Administered 2018-12-01: 10 mg via INTRAVENOUS
  Administered 2018-12-01 (×2): 5 mg via INTRAVENOUS

## 2018-12-01 MED ORDER — ONDANSETRON HCL 4 MG/2ML IJ SOLN: 4.0000 mg | Freq: Once | INTRAMUSCULAR | Status: DC | PRN

## 2018-12-01 MED ORDER — POTASSIUM CHLORIDE CRYS ER 20 MEQ PO TBCR
20.0000 meq | EXTENDED_RELEASE_TABLET | Freq: Every day | ORAL | Status: DC
  Administered 2018-12-01 – 2018-12-02 (×2): 20 meq via ORAL
  Filled 2018-12-01 (×2): qty 1

## 2018-12-01 MED ORDER — TRANEXAMIC ACID-NACL 1000-0.7 MG/100ML-% IV SOLN
1000.0000 mg | Freq: Once | INTRAVENOUS | Status: AC
  Administered 2018-12-01: 16:00:00 1000 mg via INTRAVENOUS
  Filled 2018-12-01: qty 100

## 2018-12-01 MED ORDER — DEXAMETHASONE SODIUM PHOSPHATE 10 MG/ML IJ SOLN
INTRAMUSCULAR | Status: AC
  Filled 2018-12-01: qty 1

## 2018-12-01 MED ORDER — ACETAMINOPHEN 10 MG/ML IV SOLN: 1000.0000 mg | Freq: Once | INTRAVENOUS | Status: DC | PRN

## 2018-12-01 MED ORDER — ACETAMINOPHEN 500 MG PO TABS
1000.0000 mg | ORAL_TABLET | Freq: Four times a day (QID) | ORAL | Status: AC
  Administered 2018-12-01 – 2018-12-02 (×4): 1000 mg via ORAL
  Filled 2018-12-01 (×6): qty 2

## 2018-12-01 MED ORDER — KETOROLAC TROMETHAMINE 15 MG/ML IJ SOLN
7.5000 mg | Freq: Four times a day (QID) | INTRAMUSCULAR | Status: AC
  Administered 2018-12-01 – 2018-12-02 (×4): 7.5 mg via INTRAVENOUS
  Filled 2018-12-01 (×4): qty 1

## 2018-12-01 MED ORDER — LIDOCAINE 2% (20 MG/ML) 5 ML SYRINGE
INTRAMUSCULAR | Status: AC
  Filled 2018-12-01: qty 5

## 2018-12-01 MED ORDER — POVIDONE-IODINE 10 % EX SWAB: 2.0000 "application " | Freq: Once | CUTANEOUS | Status: DC

## 2018-12-01 MED ORDER — METOCLOPRAMIDE HCL 5 MG PO TABS: 5.0000 mg | ORAL_TABLET | Freq: Three times a day (TID) | ORAL | Status: DC | PRN

## 2018-12-01 MED ORDER — PROPOFOL 500 MG/50ML IV EMUL
INTRAVENOUS | Status: DC | PRN
  Administered 2018-12-01: 100 ug/kg/min via INTRAVENOUS

## 2018-12-01 SURGICAL SUPPLY — 55 items
ATTUNE MED DOME PAT 32 KNEE (Knees) ×1 IMPLANT
ATTUNE MED DOME PAT 32MM KNEE (Knees) ×1 IMPLANT
ATTUNE PS FEM LT SZ 3 CEM KNEE (Femur) ×2 IMPLANT
ATTUNE PSRP INSR SZ3 6 KNEE (Insert) ×1 IMPLANT
ATTUNE PSRP INSR SZ3 6MM KNEE (Insert) ×1 IMPLANT
BAG DECANTER FOR FLEXI CONT (MISCELLANEOUS) ×3 IMPLANT
BAG ZIPLOCK 12X15 (MISCELLANEOUS) ×3 IMPLANT
BASE TIBIAL ROT PLAT SZ 5 KNEE (Knees) IMPLANT
BLADE SAGITTAL 25.0X1.19X90 (BLADE) ×2 IMPLANT
BLADE SAGITTAL 25.0X1.19X90MM (BLADE) ×1
BLADE SAW SGTL 13.0X1.19X90.0M (BLADE) ×3 IMPLANT
BNDG ELASTIC 6X10 VLCR STRL LF (GAUZE/BANDAGES/DRESSINGS) ×2 IMPLANT
BNDG ELASTIC 6X5.8 VLCR STR LF (GAUZE/BANDAGES/DRESSINGS) ×3 IMPLANT
BOOTIES KNEE HIGH SLOAN (MISCELLANEOUS) ×3 IMPLANT
BOWL SMART MIX CTS (DISPOSABLE) ×3 IMPLANT
CEMENT HV SMART SET (Cement) ×6 IMPLANT
COVER SURGICAL LIGHT HANDLE (MISCELLANEOUS) ×3 IMPLANT
COVER WAND RF STERILE (DRAPES) IMPLANT
CUFF TOURN SGL QUICK 34 (TOURNIQUET CUFF) ×2
CUFF TRNQT CYL 34X4.125X (TOURNIQUET CUFF) ×1 IMPLANT
DECANTER SPIKE VIAL GLASS SM (MISCELLANEOUS) ×6 IMPLANT
DRAPE SHEET LG 3/4 BI-LAMINATE (DRAPES) ×3 IMPLANT
DRAPE TOP 10253 STERILE (DRAPES) ×3 IMPLANT
DRAPE U-SHAPE 47X51 STRL (DRAPES) ×3 IMPLANT
DRESSING AQUACEL AG SP 3.5X10 (GAUZE/BANDAGES/DRESSINGS) IMPLANT
DRSG AQUACEL AG ADV 3.5X10 (GAUZE/BANDAGES/DRESSINGS) ×3 IMPLANT
DRSG AQUACEL AG SP 3.5X10 (GAUZE/BANDAGES/DRESSINGS) ×3
DURAPREP 26ML APPLICATOR (WOUND CARE) ×6 IMPLANT
ELECT REM PT RETURN 15FT ADLT (MISCELLANEOUS) ×3 IMPLANT
GLOVE BIO SURGEON STRL SZ8 (GLOVE) ×6 IMPLANT
GLOVE BIOGEL PI IND STRL 8 (GLOVE) ×2 IMPLANT
GLOVE BIOGEL PI INDICATOR 8 (GLOVE) ×4
GOWN STRL REUS W/TWL XL LVL3 (GOWN DISPOSABLE) ×6 IMPLANT
HANDPIECE INTERPULSE COAX TIP (DISPOSABLE) ×2
HOLDER FOLEY CATH W/STRAP (MISCELLANEOUS) ×2 IMPLANT
HOOD PEEL AWAY FLYTE STAYCOOL (MISCELLANEOUS) ×9 IMPLANT
KIT TURNOVER KIT A (KITS) IMPLANT
MANIFOLD NEPTUNE II (INSTRUMENTS) ×3 IMPLANT
NS IRRIG 1000ML POUR BTL (IV SOLUTION) ×3 IMPLANT
PACK TOTAL KNEE CUSTOM (KITS) ×3 IMPLANT
PAD ARMBOARD 7.5X6 YLW CONV (MISCELLANEOUS) ×3 IMPLANT
PIN STEINMAN FIXATION KNEE (PIN) ×2 IMPLANT
PIN THREADED HEADED SIGMA (PIN) ×2 IMPLANT
PROTECTOR NERVE ULNAR (MISCELLANEOUS) ×3 IMPLANT
SET HNDPC FAN SPRY TIP SCT (DISPOSABLE) ×1 IMPLANT
SUT ETHIBOND NAB CT1 #1 30IN (SUTURE) ×5 IMPLANT
SUT VIC AB 0 CT1 36 (SUTURE) ×3 IMPLANT
SUT VIC AB 2-0 CT1 27 (SUTURE) ×2
SUT VIC AB 2-0 CT1 TAPERPNT 27 (SUTURE) ×1 IMPLANT
SUT VIC AB 3-0 CT1 27 (SUTURE) ×2
SUT VIC AB 3-0 CT1 TAPERPNT 27 (SUTURE) ×1 IMPLANT
TIBIAL BASE ROT PLAT SZ 5 KNEE (Knees) ×3 IMPLANT
TRAY FOLEY MTR SLVR 16FR STAT (SET/KITS/TRAYS/PACK) IMPLANT
WATER STERILE IRR 1000ML POUR (IV SOLUTION) ×3 IMPLANT
WRAP KNEE MAXI GEL POST OP (GAUZE/BANDAGES/DRESSINGS) ×3 IMPLANT

## 2018-12-01 NOTE — Anesthesia Procedure Notes (Signed)
Spinal  Patient location during procedure: OR Start time: 12/01/2018 11:37 AM End time: 12/01/2018 11:43 AM Staffing Anesthesiologist: Myrtie Soman, MD Performed: anesthesiologist  Preanesthetic Checklist Completed: patient identified, site marked, surgical consent, pre-op evaluation, timeout performed, IV checked, risks and benefits discussed and monitors and equipment checked Spinal Block Patient position: sitting Prep: Betadine Patient monitoring: heart rate, continuous pulse ox and blood pressure Approach: midline Location: L3-4 Injection technique: single-shot Needle Needle type: Sprotte  Needle gauge: 24 G Needle length: 9 cm Additional Notes Expiration date of kit checked and confirmed. Patient tolerated procedure well, without complications.

## 2018-12-01 NOTE — Transfer of Care (Signed)
Immediate Anesthesia Transfer of Care Note  Patient: Alexandra [name]  Procedure(s) Performed: Left Knee Arthroplasty (Left Knee)  Patient Location: PACU  Anesthesia Type:Spinal  Level of Consciousness: awake, alert  and oriented  Airway & Oxygen Therapy: Patient Spontanous Breathing and Patient connected to face mask oxygen  Post-op Assessment: Report given to RN and Post -op Vital signs reviewed and stable  Post vital signs: Reviewed and stable  Last Vitals:  Vitals Value Taken Time  BP 109/68 12/01/18 1331  Temp    Pulse 66 12/01/18 1331  Resp 15 12/01/18 1331  SpO2 99 % 12/01/18 1331  Vitals shown include unvalidated device data.  Last Pain:  Vitals:   12/01/18 1059  TempSrc:   PainSc: 0-No pain      Patients Stated Pain Goal: 6 ([phone] [id number])  Complications: No apparent anesthesia complications

## 2018-12-01 NOTE — Anesthesia Postprocedure Evaluation (Signed)
Anesthesia Post Note  Patient: Alexandra [name]  Procedure(s) Performed: Left Knee Arthroplasty (Left Knee)     Patient location during evaluation: PACU Anesthesia Type: Spinal Level of consciousness: awake and alert Pain management: pain level controlled Vital Signs Assessment: post-procedure vital signs reviewed and stable Respiratory status: spontaneous breathing and respiratory function stable Cardiovascular status: blood pressure returned to baseline and stable Postop Assessment: spinal receding Anesthetic complications: no    Last Vitals:  Vitals:   12/01/18 1400 12/01/18 1415  BP: (!) 127/57 138/78  Pulse: (!) 56 (!) 58  Resp: 18 20  Temp:  (!) 36.3 C  SpO2: 95% 97%    Last Pain:  Vitals:   12/01/18 1415  TempSrc:   PainSc: 4                  Taline Nass DANIEL

## 2018-12-01 NOTE — Anesthesia Procedure Notes (Signed)
Procedure Name: MAC Date/Time: 12/01/2018 11:38 AM Performed by: Eben Burow, CRNA Pre-anesthesia Checklist: Patient identified, Emergency Drugs available, Suction available, Patient being monitored and Timeout performed Oxygen Delivery Method: Simple face mask Dental Injury: Teeth and Oropharynx as per pre-operative assessment

## 2018-12-01 NOTE — Interval H&P Note (Signed)
History and Physical Interval Note:  12/01/2018 11:18 AM  Alexandra [name]  has presented today for surgery, with the diagnosis of Left Knee Degenerative Joint Disease.  The various methods of treatment have been discussed with the patient and family. After consideration of risks, benefits and other options for treatment, the patient has consented to  Procedure(s): Left Knee Arthroplasty (Left) as a surgical intervention.  The patient's history has been reviewed, patient examined, no change in status, stable for surgery.  I have reviewed the patient's chart and labs.  Questions were answered to the patient's satisfaction.     [name]

## 2018-12-01 NOTE — Anesthesia Procedure Notes (Signed)
Anesthesia Regional Block: Adductor canal block   Pre-Anesthetic Checklist: ,, timeout performed, Correct Patient, Correct Site, Correct Laterality, Correct Procedure, Correct Position, site marked, Risks and benefits discussed,  Surgical consent,  Pre-op evaluation,  At surgeon's request and post-op pain management  Laterality: Left  Prep: chloraprep       Needles:  Injection technique: Single-shot  Needle Type: Echogenic Needle     Needle Length: 9cm      Additional Needles:   Procedures:,,,, ultrasound used (permanent image in chart),,,,  Narrative:  Start time: 12/01/2018 10:44 AM End time: 12/01/2018 10:50 AM Injection made incrementally with aspirations every 5 mL.  Performed by: Personally  Anesthesiologist: Myrtie Soman, MD  Additional Notes: Patient tolerated the procedure well without complications

## 2018-12-01 NOTE — Progress Notes (Signed)
Assisted Dr. Rose with left, ultrasound guided, adductor canal block. Side rails up, monitors on throughout procedure. See vital signs in flow sheet. Tolerated Procedure well.  

## 2018-12-01 NOTE — Op Note (Signed)
PREOP DIAGNOSIS: DJD LEFT KNEE POSTOP DIAGNOSIS:  same PROCEDURE: LEFT TKR ANESTHESIA: Spinal and MAC ATTENDING SURGEON: [name] ASSISTANT: Loni Dolly PA  INDICATIONS FOR PROCEDURE: Alexandra [name] is a 67 y.o. female who has struggled for a long time with pain due to degenerative arthritis of the left knee.  The patient has failed many conservative non-operative measures and at this point has pain which limits the ability to sleep and walk.  The patient is offered total knee replacement.  Informed operative consent was obtained after discussion of possible risks of anesthesia, infection, neurovascular injury, DVT, and death.  The importance of the post-operative rehabilitation protocol to optimize result was stressed extensively with the patient.  SUMMARY OF FINDINGS AND PROCEDURE:  MEILANI EDMUNDSON was taken to the operative suite where under the above anesthesia a left knee replacement was performed.  There were advanced degenerative changes and the bone quality was poor.  We used the DePuyAttune system and placed size 3 femur, 5 tibia, 32 mm all polyethylene patella, and a size 6 mm spacer.  Loni Dolly PA-C assisted throughout and was invaluable to the completion of the case in that he helped retract and maintain exposure while I placed the components.  He also helped close thereby minimizing OR time.  The patient was admitted for appropriate post-op care to include perioperative antibiotics and mechanical and pharmacologic measures for DVT prophylaxis.  DESCRIPTION OF PROCEDURE:  Alexandra [name] was taken to the operative suite where the above anesthesia was applied.  The patient was positioned supine and prepped and draped in normal sterile fashion.  An appropriate time out was performed.  After the administration of kefzol pre-op antibiotic the leg was elevated and exsanguinated and a tourniquet inflated.  A standard longitudinal incision was made on the anterior knee.  Dissection was  carried down to the extensor mechanism.  All appropriate anti-infective measures were used including the pre-operative antibiotic, betadine impregnated drape, and closed hooded exhaust systems for each member of the surgical team.  A medial parapatellar incision was made in the extensor mechanism and the knee cap flipped and the knee flexed.  Some residual meniscal tissues were removed along with any remaining ACL/PCL tissue.  A guide was placed on the tibia and a flat cut was made on it's superior surface.  An intramedullary guide was placed in the femur and was utilized to make anterior and posterior cuts creating an appropriate flexion gap.  A second intramedullary guide was placed in the femur to make a distal cut properly balancing the knee with an extension gap equal to the flexion gap.  The three bones sized to the above mentioned sizes and the appropriate guides were placed and utilized.  A trial reduction was done and the knee easily came to full extension and the patella tracked well on flexion.  The trial components were removed and all bones were cleaned with pulsatile lavage and then dried thoroughly.  Cement was mixed and was pressurized onto the bones followed by placement of the aforementioned components.  Excess cement was trimmed and pressure was held on the components until the cement had hardened.  The tourniquet was deflated and a small amount of bleeding was controlled with cautery and pressure.  The knee was irrigated thoroughly.  The extensor mechanism was re-approximated with #1 ethibond in interrupted fashion.  The knee was flexed and the repair was solid.  The subcutaneous tissues were re-approximated with #0 and #2-0 vicryl and the skin  closed with a subcuticular stitch and steristrips.  A sterile dressing was applied.  Intraoperative fluids, EBL, and tourniquet time can be obtained from anesthesia records.  DISPOSITION:  The patient was taken to recovery room in stable condition and  admitted for appropriate post-op care to include peri-operative antibiotic and DVT prophylaxis with mechanical and pharmacologic measures.  Monico Blitz Usama Harkless 12/01/2018, 1:00 PM

## 2018-12-01 NOTE — Evaluation (Signed)
Physical Therapy Evaluation Patient Details Name: Alexandra [name] MRN: [id number] DOB: Mar 09, 1951 Today's Date: 12/01/2018   History of Present Illness  L TKA; PMH R ankle and femur fxs  Clinical Impression  Pt is s/p TKA resulting in the deficits listed below (see PT Problem List). Pt ambulated 64' with RW, initiated TKA HEP. Good progress expected.  Pt will benefit from skilled PT to increase their independence and safety with mobility to allow discharge to the venue listed below.      Follow Up Recommendations Follow surgeon's recommendation for DC plan and follow-up therapies    Equipment Recommendations  Rolling walker with 5" wheels;3in1 (PT)    Recommendations for Other Services       Precautions / Restrictions Precautions Precautions: Knee;Fall Precaution Comments: reviewed no pillow under knee Restrictions Weight Bearing Restrictions: No Other Position/Activity Restrictions: WBAT      Mobility  Bed Mobility Overal bed mobility: Modified Independent             General bed mobility comments: HOB up  Transfers Overall transfer level: Needs assistance Equipment used: Rolling walker (2 wheeled) Transfers: Sit to/from Stand Sit to Stand: Min guard;From elevated surface         General transfer comment: VCs hand placement  Ambulation/Gait Ambulation/Gait assistance: Min guard Gait Distance (Feet): 40 Feet Assistive device: Rolling walker (2 wheeled) Gait Pattern/deviations: Step-to pattern;Decreased stride length;Decreased weight shift to left Gait velocity: decr   General Gait Details: VCs sequencing, no loss of balance, distance limited by pain  Stairs            Wheelchair Mobility    Modified Rankin (Stroke Patients Only)       Balance Overall balance assessment: Modified Independent                                           Pertinent Vitals/Pain Pain Assessment: 0-10 Pain Score: 7  Pain Location: L knee Pain  Descriptors / Indicators: Sore Pain Intervention(s): Limited activity within patient's tolerance;Monitored during session;Premedicated before session;Ice applied    Home Living Family/patient expects to be discharged to:: (P) Private residence Living Arrangements: (P) Spouse/significant other     Home Access: (P) Stairs to enter   Entrance Stairs-Number of Steps: (P) 3 Home Layout: (P) One level Home Equipment: (P) Cane - single point      Prior Function Level of Independence: (P) Independent with assistive device(s)               Hand Dominance        Extremity/Trunk Assessment   Upper Extremity Assessment Upper Extremity Assessment: Overall WFL for tasks assessed    Lower Extremity Assessment Lower Extremity Assessment: LLE deficits/detail LLE Deficits / Details: 5-45* AAROM L knee LLE Sensation: WNL LLE Coordination: WNL    Cervical / Trunk Assessment Cervical / Trunk Assessment: Normal  Communication      Cognition Arousal/Alertness: Awake/alert Behavior During Therapy: WFL for tasks assessed/performed Overall Cognitive Status: Within Functional Limits for tasks assessed                                        General Comments      Exercises Total Joint Exercises Ankle Circles/Pumps: AROM;Both;10 reps;Supine Heel Slides: AAROM;Left;10 reps;Supine Straight Leg Raises: AAROM;Left;5 reps;Supine  Assessment/Plan    PT Assessment Patient needs continued PT services  PT Problem List Decreased strength;Decreased range of motion;Decreased activity tolerance;Decreased mobility;Pain;Decreased knowledge of use of DME       PT Treatment Interventions DME instruction;Gait training;Stair training;Functional mobility training;Therapeutic exercise;Therapeutic activities;Patient/family education    PT Goals (Current goals can be found in the Care Plan section)  Acute Rehab PT Goals Patient Stated Goal: house cleaning, cooking PT Goal  Formulation: With patient Time For Goal Achievement: 12/08/18 Potential to Achieve Goals: Good    Frequency 7X/week   Barriers to discharge        Co-evaluation               AM-PAC PT "6 Clicks" Mobility  Outcome Measure Help needed turning from your back to your side while in a flat bed without using bedrails?: A Little Help needed moving from lying on your back to sitting on the side of a flat bed without using bedrails?: A Little Help needed moving to and from a bed to a chair (including a wheelchair)?: A Little Help needed standing up from a chair using your arms (e.g., wheelchair or bedside chair)?: A Little Help needed to walk in hospital room?: A Little Help needed climbing 3-5 steps with a railing? : A Lot 6 Click Score: 17    End of Session Equipment Utilized During Treatment: Gait belt Activity Tolerance: Patient tolerated treatment well Patient left: in chair;with call bell/phone within reach Nurse Communication: Mobility status PT Visit Diagnosis: Muscle weakness (generalized) (M62.81);Difficulty in walking, not elsewhere classified (R26.2);Pain Pain - Right/Left: Left Pain - part of body: Knee    Time: 1725-1752 PT Time Calculation (min) (ACUTE ONLY): 27 min   Charges:   PT Evaluation $PT Eval Low Complexity: 1 Low PT Treatments $Gait Training: 8-22 mins       Blondell Reveal Kistler PT 12/01/2018  Acute Rehabilitation Services Pager [phone] Office [phone]

## 2018-12-01 NOTE — Anesthesia Procedure Notes (Signed)
Anesthesia Procedure Image    

## 2018-12-02 ENCOUNTER — Encounter (HOSPITAL_COMMUNITY): Payer: Self-pay | Admitting: Orthopaedic Surgery

## 2018-12-02 DIAGNOSIS — M1712 Unilateral primary osteoarthritis, left knee: Secondary | ICD-10-CM | POA: Diagnosis not present

## 2018-12-02 MED ORDER — ASPIRIN 81 MG PO TABS: 81.0000 mg | ORAL_TABLET | Freq: Two times a day (BID) | ORAL | 0 refills | Status: DC

## 2018-12-02 MED ORDER — PROMETHAZINE HCL 12.5 MG PO TABS: 12.5000 mg | ORAL_TABLET | Freq: Four times a day (QID) | ORAL | 1 refills | Status: DC | PRN

## 2018-12-02 MED ORDER — OXYCODONE-ACETAMINOPHEN 10-325 MG PO TABS: 1.0000 | ORAL_TABLET | Freq: Four times a day (QID) | ORAL | 0 refills | Status: DC | PRN

## 2018-12-02 MED ORDER — TIZANIDINE HCL 4 MG PO TABS: 4.0000 mg | ORAL_TABLET | Freq: Four times a day (QID) | ORAL | 1 refills | Status: AC | PRN

## 2018-12-02 NOTE — Progress Notes (Signed)
Physical Therapy Treatment Patient Details Name: Alexandra [name] MRN: [id number] DOB: [birth year] Today's Date: 12/02/2018    History of Present Illness L TKA; PMH R ankle and femur fxs    PT Comments    Pt ambulated in hallway and practiced safe stair technique again.  Pt provided with HEP handout and had no further questions.  Pt feels ready for d/c home today and plans to f/u with OPPT.   Follow Up Recommendations  Follow surgeon's recommendation for DC plan and follow-up therapies     Equipment Recommendations  Rolling walker with 5" wheels;3in1 (PT)    Recommendations for Other Services       Precautions / Restrictions Precautions Precautions: Knee;Fall Restrictions Other Position/Activity Restrictions: WBAT    Mobility  Bed Mobility Overal bed mobility: Needs Assistance Bed Mobility: Supine to Sit     Supine to sit: Supervision;HOB elevated        Transfers Overall transfer level: Needs assistance Equipment used: Rolling walker (2 wheeled) Transfers: Sit to/from Stand Sit to Stand: Min guard;From elevated surface         General transfer comment: verbal cues for safe technique  Ambulation/Gait Ambulation/Gait assistance: Min guard;Supervision Gait Distance (Feet): 160 Feet Assistive device: Rolling walker (2 wheeled) Gait Pattern/deviations: Step-to pattern;Decreased stride length;Decreased stance time - left Gait velocity: decr   General Gait Details: verbal cues for sequence, RW positioning, step length   Stairs Stairs: Yes Stairs assistance: Min guard Stair Management: Step to pattern;Forwards;One rail Left Number of Stairs: 3 General stair comments: verbal cues for sequence, rail on L and provided HHA for R UE, pt performed well and reports understanding   Wheelchair Mobility    Modified Rankin (Stroke Patients Only)       Balance                                            Cognition Arousal/Alertness:  Awake/alert Behavior During Therapy: WFL for tasks assessed/performed Overall Cognitive Status: Within Functional Limits for tasks assessed                                        Exercises     General Comments        Pertinent Vitals/Pain Pain Assessment: 0-10 Pain Score: 5  Pain Location: L knee Pain Descriptors / Indicators: Sore;Aching Pain Intervention(s): Repositioned;Monitored during session    Home Living                      Prior Function            PT Goals (current goals can now be found in the care plan section) Progress towards PT goals: Progressing toward goals    Frequency    7X/week      PT Plan Current plan remains appropriate    Co-evaluation              AM-PAC PT "6 Clicks" Mobility   Outcome Measure  Help needed turning from your back to your side while in a flat bed without using bedrails?: A Little Help needed moving from lying on your back to sitting on the side of a flat bed without using bedrails?: A Little Help needed moving to and from a bed to a chair (including a  wheelchair)?: A Little Help needed standing up from a chair using your arms (e.g., wheelchair or bedside chair)?: A Little Help needed to walk in hospital room?: A Little Help needed climbing 3-5 steps with a railing? : A Little 6 Click Score: 18    End of Session Equipment Utilized During Treatment: Gait belt Activity Tolerance: Patient tolerated treatment well Patient left: in chair;with call bell/phone within reach;with chair alarm set Nurse Communication: Mobility status PT Visit Diagnosis: Muscle weakness (generalized) (M62.81);Difficulty in walking, not elsewhere classified (R26.2)     Time: 1219-1229 PT Time Calculation (min) (ACUTE ONLY): 10 min  Charges:  $Gait Training: 8-22 mins                     Carmelia Bake, PT, DPT Acute Rehabilitation Services Office: [phone] Pager: [phone]  Trena Platt 12/02/2018, 1:22 PM

## 2018-12-02 NOTE — Progress Notes (Signed)
Physical Therapy Treatment Patient Details Name: Alexandra [name] MRN: [id number] DOB: December 03, 1950 Today's Date: 12/02/2018    History of Present Illness L TKA; PMH R ankle and femur fxs    PT Comments    Pt ambulated in hallway, practiced safe stair technique and performed LE exercises.  Pt plans to d/c home after second session.   Follow Up Recommendations  Follow surgeon's recommendation for DC plan and follow-up therapies     Equipment Recommendations  Rolling walker with 5" wheels;3in1 (PT)    Recommendations for Other Services       Precautions / Restrictions Precautions Precautions: Knee;Fall Restrictions Other Position/Activity Restrictions: WBAT    Mobility  Bed Mobility Overal bed mobility: Needs Assistance Bed Mobility: Supine to Sit     Supine to sit: Supervision;HOB elevated        Transfers Overall transfer level: Needs assistance Equipment used: Rolling walker (2 wheeled) Transfers: Sit to/from Stand Sit to Stand: Min guard;From elevated surface         General transfer comment: verbal cues for safe technique  Ambulation/Gait Ambulation/Gait assistance: Min guard Gait Distance (Feet): 160 Feet Assistive device: Rolling walker (2 wheeled) Gait Pattern/deviations: Step-to pattern;Decreased stride length;Decreased stance time - left Gait velocity: decr   General Gait Details: verbal cues for sequence, RW positioning, use of UEs through RW for stability (pt reports one instance of L knee buckling however no physical assist required)   Stairs Stairs: Yes Stairs assistance: Min guard Stair Management: Step to pattern;Forwards;One rail Left Number of Stairs: 3 General stair comments: verbal cues for sequence, pt has rail on left at home so utilized left rail, pt states son can assist her into home so provided HHA for R UE, pt performed well   Wheelchair Mobility    Modified Rankin (Stroke Patients Only)       Balance                                             Cognition Arousal/Alertness: Awake/alert Behavior During Therapy: WFL for tasks assessed/performed Overall Cognitive Status: Within Functional Limits for tasks assessed                                        Exercises Total Joint Exercises Ankle Circles/Pumps: AROM;Both;10 reps Quad Sets: AROM;Both;10 reps Heel Slides: AAROM;Left;10 reps;Supine Hip ABduction/ADduction: AROM;Left;10 reps Straight Leg Raises: 10 reps;AAROM;Left Long Arc Quad: AROM;Left;10 reps Knee Flexion: AAROM;Left;Seated;10 reps Goniometric ROM: approx 100* AAROM L knee flexion    General Comments        Pertinent Vitals/Pain Pain Assessment: 0-10 Pain Score: 6  Pain Location: L knee Pain Descriptors / Indicators: Sore;Aching Pain Intervention(s): Monitored during session;Repositioned;Premedicated before session;Ice applied    Home Living                      Prior Function            PT Goals (current goals can now be found in the care plan section) Progress towards PT goals: Progressing toward goals    Frequency    7X/week      PT Plan Current plan remains appropriate    Co-evaluation              AM-PAC PT "6 Clicks"  Mobility   Outcome Measure  Help needed turning from your back to your side while in a flat bed without using bedrails?: A Little Help needed moving from lying on your back to sitting on the side of a flat bed without using bedrails?: A Little Help needed moving to and from a bed to a chair (including a wheelchair)?: A Little Help needed standing up from a chair using your arms (e.g., wheelchair or bedside chair)?: A Little Help needed to walk in hospital room?: A Little Help needed climbing 3-5 steps with a railing? : A Little 6 Click Score: 18    End of Session Equipment Utilized During Treatment: Gait belt Activity Tolerance: Patient tolerated treatment well Patient left: in chair;with call  bell/phone within reach;with chair alarm set Nurse Communication: Mobility status PT Visit Diagnosis: Muscle weakness (generalized) (M62.81);Difficulty in walking, not elsewhere classified (R26.2)     Time: [id number] PT Time Calculation (min) (ACUTE ONLY): 31 min  Charges:  $Gait Training: 8-22 mins $Therapeutic Exercise: 8-22 mins                    Carmelia Bake, PT, DPT Acute Rehabilitation Services Office: [phone] Pager: [phone]   Trena Platt 12/02/2018, 1:19 PM

## 2018-12-02 NOTE — Progress Notes (Signed)
Subjective: 1 Day Post-Op Procedure(s) (LRB): Left Knee Arthroplasty (Left)   Patient doing well. She is having some nausea this morning but is still hoping to go home. We need to talk to pain clinic about her discharge pain meds.  Activity level:  wbat Diet tolerance:  ok Voiding:  Foley out this morning Patient reports pain as mild and moderate.    Objective: Vital signs in last 24 hours: Temp:  [97.4 F (36.3 C)-98.4 F (36.9 C)] 97.9 F (36.6 C) (09/09 0428) Pulse Rate:  [51-66] 60 (09/09 0428) Resp:  [12-23] 14 (09/09 0428) BP: (106-153)/(54-82) 153/69 (09/09 0428) SpO2:  [95 %-100 %] 99 % (09/09 0428) Weight:  [71.3 kg] 71.3 kg (09/08 0937)  Labs: No results for input(s): HGB in the last 72 hours. No results for input(s): WBC, RBC, HCT, PLT in the last 72 hours. No results for input(s): NA, K, CL, CO2, BUN, CREATININE, GLUCOSE, CALCIUM in the last 72 hours. No results for input(s): LABPT, INR in the last 72 hours.  Physical Exam:  Neurologically intact ABD soft Neurovascular intact Sensation intact distally Intact pulses distally Dorsiflexion/Plantar flexion intact Incision: dressing C/D/I and no drainage No cellulitis present Compartment soft  Assessment/Plan:  1 Day Post-Op Procedure(s) (LRB): Left Knee Arthroplasty (Left) Advance diet Up with therapy D/C IV fluids Discharge home with home health possibly today if doing well and cleared by PT. I will speak with her pain management today to see about home pain medication. Continue on 81mg  asa BID x 2 weeks for DVT prevention.  Follow up in office 2 weeks post op.   Larwance Sachs Lateasha Breuer 12/02/2018, 7:48 AM

## 2018-12-02 NOTE — Plan of Care (Signed)
  Problem: Health Behavior/Discharge Planning: Goal: Ability to manage health-related needs will improve Outcome: Not Applicable   Problem: Clinical Measurements: Goal: Ability to maintain clinical measurements within normal limits will improve Outcome: Not Applicable Goal: Will remain free from infection Outcome: Not Applicable Goal: Diagnostic test results will improve Outcome: Not Applicable Goal: Respiratory complications will improve Outcome: Not Applicable Goal: Cardiovascular complication will be avoided Outcome: Not Applicable   Problem: Activity: Goal: Risk for activity intolerance will decrease Outcome: Not Applicable   Problem: Nutrition: Goal: Adequate nutrition will be maintained Outcome: Not Applicable   Problem: Elimination: Goal: Will not experience complications related to bowel motility Outcome: Not Applicable Goal: Will not experience complications related to urinary retention Outcome: Not Applicable   Problem: Pain Managment: Goal: General experience of comfort will improve Outcome: Not Applicable   Problem: Safety: Goal: Ability to remain free from injury will improve Outcome: Not Applicable   Problem: Skin Integrity: Goal: Risk for impaired skin integrity will decrease Outcome: Not Applicable   Problem: Education: Goal: Knowledge of the prescribed therapeutic regimen will improve Outcome: Not Applicable   Problem: Activity: Goal: Ability to avoid complications of mobility impairment will improve Outcome: Not Applicable Goal: Range of joint motion will improve Outcome: Not Applicable   Problem: Clinical Measurements: Goal: Postoperative complications will be avoided or minimized Outcome: Not Applicable   Problem: Pain Management: Goal: Pain level will decrease with appropriate interventions Outcome: Not Applicable   Problem: Skin Integrity: Goal: Will show signs of wound healing Outcome: Not Applicable

## 2018-12-02 NOTE — Discharge Summary (Signed)
Patient ID: Alexandra [name] MRN: [id number] DOB/AGE: [id number] y.o.  Admit date: 12/01/2018 Discharge date: 12/02/2018  Admission Diagnoses:  Principal Problem:   Primary osteoarthritis of left knee   Discharge Diagnoses:  Same  Past Medical History:  Diagnosis Date  . Arthritis   . COPD (chronic obstructive pulmonary disease) ([place]) 05/22/2016  . Coronary artery disease, non-occlusive 11/28/2016   R&LHC: 65% distal LAD lesion -- likely not angiographically significant->medical management. Normal LVEF. Moderately elevated LVEDP. I aortic valve gradient in the Cath Lab, moderate aortic stenosis. This would suggest that the stenosis is on the moderate side of moderate to severe.  Severe pulmonary hypertension (likely mixed).  AVA 0.96 cm; P-P gradient ~ 30 mmHg, Mean ~24.5-27.5 (Moderate AS)   . GERD (gastroesophageal reflux disease)   . Heart murmur   . Hx of adenomatous colonic polyps 10/19/2015  . Hyperlipidemia   . Hypertension   . Moderate mitral regurgitation by prior echocardiography 10/2016   Echocardiogram revealed moderate mitral regurgitation  . Pulmonary hypertension associated Aortic Stenosis, Mitral Regurgitation & COPD    Peak PAP by Echo ~54 mmHg;  by Cath PAP/Mean 71/32 mmHg, 53 mmHg (with PCWP & LVEDP ~24-26 mmHg); TPG ~26 mmHg  . Pulmonary nodules   . S/P TAVR (transcatheter aortic valve replacement)   . Severe aortic stenosis     Surgeries: Procedure(s): Left Knee Arthroplasty on 12/01/2018   Consultants:   Discharged Condition: Improved  Hospital Course: Alexandra [name] is an 68 y.o. female who was admitted 12/01/2018 for operative treatment ofPrimary osteoarthritis of left knee. Patient has severe unremitting pain that affects sleep, daily activities, and work/hobbies. After pre-op clearance the patient was taken to the operating room on 12/01/2018 and underwent  Procedure(s): Left Knee Arthroplasty.    Patient was given perioperative antibiotics:  Anti-infectives  (From admission, onward)   Start     Dose/Rate Route Frequency Ordered Stop   12/01/18 1800  ceFAZolin (ANCEF) IVPB 2g/100 mL premix     2 g 200 mL/hr over 30 Minutes Intravenous Every 6 hours 12/01/18 1432 12/02/18 0011   12/01/18 0945  ceFAZolin (ANCEF) IVPB 2g/100 mL premix     2 g 200 mL/hr over 30 Minutes Intravenous On call to O.R. 12/01/18 0935 12/01/18 1141       Patient was given sequential compression devices, early ambulation, and chemoprophylaxis to prevent DVT.  Patient benefited maximally from hospital stay and there were no complications.    Recent vital signs:  Patient Vitals for the past 24 hrs:  BP Temp Temp src Pulse Resp SpO2  12/02/18 1357 (!) 154/53 98.1 F (36.7 C) - (!) 53 16 100 %  12/02/18 0812 (!) 160/81 98 F (36.7 C) Oral 65 15 -  12/02/18 0428 (!) 153/69 97.9 F (36.6 C) Oral 60 14 99 %  12/02/18 0107 (!) 106/54 98.4 F (36.9 C) Oral (!) 58 14 99 %  12/01/18 2136 112/71 97.7 F (36.5 C) Oral (!) 57 17 96 %  12/01/18 1645 128/68 97.8 F (36.6 C) Oral (!) 52 20 100 %  12/01/18 1526 (!) 146/75 98.1 F (36.7 C) Oral (!) 55 18 99 %  12/01/18 1415 138/78 (!) 97.4 F (36.3 C) - (!) 58 20 97 %     Recent laboratory studies: No results for input(s): WBC, HGB, HCT, PLT, NA, K, CL, CO2, BUN, CREATININE, GLUCOSE, INR, CALCIUM in the last 72 hours.  Invalid input(s): PT, 2   Discharge Medications:   Allergies as of  12/02/2018      Reactions   Codeine Nausea Only   Symproic [naldemedine] Nausea And Vomiting      Medication List    TAKE these medications   amLODipine 10 MG tablet Commonly known as: NORVASC Take 1 tablet (10 mg total) by mouth daily.   amoxicillin 500 MG tablet Commonly known as: AMOXIL Take 2,000 mg (four tablets) one hour prior to dental visits.   aspirin 81 MG tablet Take 1 tablet (81 mg total) by mouth 2 (two) times daily at 10 AM and 5 PM. What changed: when to take this   Azelastine HCl 0.15 % Soln Place 2 sprays into  the nose 2 (two) times daily as needed (allergies).   bisoprolol-hydrochlorothiazide 10-6.25 MG tablet Commonly known as: Ziac Take 1 tablet by mouth daily.   clopidogrel 75 MG tablet Commonly known as: PLAVIX TAKE 1 TABLET(75 MG) BY MOUTH DAILY WITH BREAKFAST   fluticasone 220 MCG/ACT inhaler Commonly known as: FLOVENT HFA Inhale 2 puffs into the lungs 2 (two) times daily as needed (wheezing and shortness of breath).   furosemide 20 MG tablet Commonly known as: LASIX Take 1 tablet (20 mg total) by mouth 2 (two) times daily. What changed: when to take this   losartan 100 MG tablet Commonly known as: COZAAR Take 1 tablet (100 mg total) by mouth daily.   montelukast 10 MG tablet Commonly known as: SINGULAIR Take 1 tablet (10 mg total) by mouth at bedtime.   oxybutynin 10 MG 24 hr tablet Commonly known as: DITROPAN-XL TAKE 1 TABLET BY MOUTH AT BEDTIME What changed:   when to take this  reasons to take this   oxyCODONE-acetaminophen 10-325 MG tablet Commonly known as: PERCOCET Take 1-2 tablets by mouth every 6 (six) hours as needed for pain. What changed:   how much to take  when to take this   potassium chloride SA 20 MEQ tablet Commonly known as: K-DUR Take 1 tablet (20 mEq total) by mouth daily.   promethazine 12.5 MG tablet Commonly known as: PHENERGAN Take 1-2 tablets (12.5-25 mg total) by mouth every 6 (six) hours as needed for nausea or vomiting.   rosuvastatin 10 MG tablet Commonly known as: CRESTOR Take 1 tablet (10 mg total) by mouth daily. What changed: when to take this   tiZANidine 4 MG tablet Commonly known as: Zanaflex Take 1 tablet (4 mg total) by mouth every 6 (six) hours as needed.   Vitamin D (Ergocalciferol) 1.25 MG (50000 UT) Caps capsule Commonly known as: DRISDOL Take 50,000 Units by mouth every 7 (seven) days.            Durable Medical Equipment  (From admission, onward)         Start     Ordered   12/01/18 1433  DME  Walker rolling  Once    Question:  Patient needs a walker to treat with the following condition  Answer:  Primary osteoarthritis of left knee   12/01/18 1432   12/01/18 1433  DME 3 n 1  Once     12/01/18 1432   12/01/18 1433  DME Bedside commode  Once    Question:  Patient needs a bedside commode to treat with the following condition  Answer:  Primary osteoarthritis of left knee   12/01/18 1432          Diagnostic Studies: Dg Chest 2 View  Result Date: 11/26/2018 CLINICAL DATA:  Hypertension, smoking history EXAM: CHEST - 2 VIEW COMPARISON:  12/30/2017 FINDINGS: The heart size and mediastinal contours are within normal limits. Cardiac vascular stent unchanged. Calcific aortic knob. Both lungs are clear. The visualized skeletal structures are unremarkable. IMPRESSION: No active cardiopulmonary disease. Electronically Signed   By: Davina Poke M.D.   On: 11/26/2018 17:01    Disposition: Discharge disposition: 01-Home or Self Care       Discharge Instructions    Call MD / Call 911   Complete by: As directed    If you experience chest pain or shortness of breath, CALL 911 and be transported to the hospital emergency room.  If you develope a fever above 101 F, pus (white drainage) or increased drainage or redness at the wound, or calf pain, call your surgeon's office.   Constipation Prevention   Complete by: As directed    Drink plenty of fluids.  Prune juice may be helpful.  You may use a stool softener, such as Colace (over the counter) 100 mg twice a day.  Use MiraLax (over the counter) for constipation as needed.   Diet - low sodium heart healthy   Complete by: As directed    Discharge instructions   Complete by: As directed    INSTRUCTIONS AFTER JOINT REPLACEMENT   Remove items at home which could result in a fall. This includes throw rugs or furniture in walking pathways ICE to the affected joint every three hours while awake for 30 minutes at a time, for at least the first  3-5 days, and then as needed for pain and swelling.  Continue to use ice for pain and swelling. You may notice swelling that will progress down to the foot and ankle.  This is normal after surgery.  Elevate your leg when you are not up walking on it.   Continue to use the breathing machine you got in the hospital (incentive spirometer) which will help keep your temperature down.  It is common for your temperature to cycle up and down following surgery, especially at night when you are not up moving around and exerting yourself.  The breathing machine keeps your lungs expanded and your temperature down.   DIET:  As you were doing prior to hospitalization, we recommend a well-balanced diet.  DRESSING / WOUND CARE / SHOWERING  You may shower 3 days after surgery, but keep the wounds dry during showering.  You may use an occlusive plastic wrap (Press'n Seal for example), NO SOAKING/SUBMERGING IN THE BATHTUB.  If the bandage gets wet, change with a clean dry gauze.  If the incision gets wet, pat the wound dry with a clean towel.  ACTIVITY  Increase activity slowly as tolerated, but follow the weight bearing instructions below.   No driving for 6 weeks or until further direction given by your physician.  You cannot drive while taking narcotics.  No lifting or carrying greater than 10 lbs. until further directed by your surgeon. Avoid periods of inactivity such as sitting longer than an hour when not asleep. This helps prevent blood clots.  You may return to work once you are authorized by your doctor.     WEIGHT BEARING   Weight bearing as tolerated with assist device (walker, cane, etc) as directed, use it as long as suggested by your surgeon or therapist, typically at least 4-6 weeks.   EXERCISES  Results after joint replacement surgery are often greatly improved when you follow the exercise, range of motion and muscle strengthening exercises prescribed by your doctor. Safety measures are also  important to protect the joint from further injury. Any time any of these exercises cause you to have increased pain or swelling, decrease what you are doing until you are comfortable again and then slowly increase them. If you have problems or questions, call your caregiver or physical therapist for advice.   Rehabilitation is important following a joint replacement. After just a few days of immobilization, the muscles of the leg can become weakened and shrink (atrophy).  These exercises are designed to build up the tone and strength of the thigh and leg muscles and to improve motion. Often times heat used for twenty to thirty minutes before working out will loosen up your tissues and help with improving the range of motion but do not use heat for the first two weeks following surgery (sometimes heat can increase post-operative swelling).   These exercises can be done on a training (exercise) mat, on the floor, on a table or on a bed. Use whatever works the best and is most comfortable for you.    Use music or television while you are exercising so that the exercises are a pleasant break in your day. This will make your life better with the exercises acting as a break in your routine that you can look forward to.   Perform all exercises about fifteen times, three times per day or as directed.  You should exercise both the operative leg and the other leg as well.   Exercises include:   Quad Sets - Tighten up the muscle on the front of the thigh (Quad) and hold for 5-10 seconds.   Straight Leg Raises - With your knee straight (if you were given a brace, keep it on), lift the leg to 60 degrees, hold for 3 seconds, and slowly lower the leg.  Perform this exercise against resistance later as your leg gets stronger.  Leg Slides: Lying on your back, slowly slide your foot toward your buttocks, bending your knee up off the floor (only go as far as is comfortable). Then slowly slide your foot back down until your  leg is flat on the floor again.  Angel Wings: Lying on your back spread your legs to the side as far apart as you can without causing discomfort.  Hamstring Strength:  Lying on your back, push your heel against the floor with your leg straight by tightening up the muscles of your buttocks.  Repeat, but this time bend your knee to a comfortable angle, and push your heel against the floor.  You may put a pillow under the heel to make it more comfortable if necessary.   A rehabilitation program following joint replacement surgery can speed recovery and prevent re-injury in the future due to weakened muscles. Contact your doctor or a physical therapist for more information on knee rehabilitation.    CONSTIPATION  Constipation is defined medically as fewer than three stools per week and severe constipation as less than one stool per week.  Even if you have a regular bowel pattern at home, your normal regimen is likely to be disrupted due to multiple reasons following surgery.  Combination of anesthesia, postoperative narcotics, change in appetite and fluid intake all can affect your bowels.   YOU MUST use at least one of the following options; they are listed in order of increasing strength to get the job done.  They are all available over the counter, and you may need to use some, POSSIBLY even all of these options:  Drink plenty of fluids (prune juice may be helpful) and high fiber foods Colace 100 mg by mouth twice a day  Senokot for constipation as directed and as needed Dulcolax (bisacodyl), take with full glass of water  Miralax (polyethylene glycol) once or twice a day as needed.  If you have tried all these things and are unable to have a bowel movement in the first 3-4 days after surgery call either your surgeon or your primary doctor.    If you experience loose stools or diarrhea, hold the medications until you stool forms back up.  If your symptoms do not get better within 1 week or if  they get worse, check with your doctor.  If you experience "the worst abdominal pain ever" or develop nausea or vomiting, please contact the office immediately for further recommendations for treatment.   ITCHING:  If you experience itching with your medications, try taking only a single pain pill, or even half a pain pill at a time.  You can also use Benadryl over the counter for itching or also to help with sleep.   TED HOSE STOCKINGS:  Use stockings on both legs until for at least 2 weeks or as directed by physician office. They may be removed at night for sleeping.  MEDICATIONS:  See your medication summary on the "After Visit Summary" that nursing will review with you.  You may have some home medications which will be placed on hold until you complete the course of blood thinner medication.  It is important for you to complete the blood thinner medication as prescribed.  PRECAUTIONS:  If you experience chest pain or shortness of breath - call 911 immediately for transfer to the hospital emergency department.   If you develop a fever greater that 101 F, purulent drainage from wound, increased redness or drainage from wound, foul odor from the wound/dressing, or calf pain - CONTACT YOUR SURGEON.                                                   FOLLOW-UP APPOINTMENTS:  If you do not already have a post-op appointment, please call the office for an appointment to be seen by your surgeon.  Guidelines for how soon to be seen are listed in your "After Visit Summary", but are typically between 1-4 weeks after surgery.  OTHER INSTRUCTIONS:   Knee Replacement:  Do not place pillow under knee, focus on keeping the knee straight while resting. CPM instructions: 0-90 degrees, 2 hours in the morning, 2 hours in the afternoon, and 2 hours in the evening. Place foam block, curve side up under heel at all times except when in CPM or when walking.  DO NOT modify, tear, cut, or change the foam block in any  way.  MAKE SURE YOU:  Understand these instructions.  Get help right away if you are not doing well or get worse.    Thank you for letting us be a part of your medical care team.  It is a privilege we respect greatly.  We hope these instructions will help you stay on track for a fast and full recovery!   Increase activity slowly as tolerated   Complete by: As directed       Follow-up Information    Melrose Nakayama, MD. Schedule an appointment as soon as possible for  a visit in 2 weeks.   Specialty: Orthopedic Surgery Contact information: [place] [id number] [phone]        Eileen Stanford, PA-C. Go on 12/31/2018.   Specialties: Cardiology, Radiology Why: @ 1:45 for an echocardiogram and office visit with Nell Range for 1 year follow up after your TAVR. Contact information: [address] [place] [address] [phone]            Signed: Larwance Sachs Alexandra [name] 12/02/2018, 2:00 PM

## 2018-12-21 ENCOUNTER — Other Ambulatory Visit: Payer: Medicare HMO

## 2018-12-28 ENCOUNTER — Ambulatory Visit: Payer: Medicare Other | Attending: Orthopaedic Surgery | Admitting: Physical Therapy

## 2018-12-28 ENCOUNTER — Other Ambulatory Visit: Payer: Self-pay

## 2018-12-28 ENCOUNTER — Encounter: Payer: Self-pay | Admitting: Physical Therapy

## 2018-12-28 DIAGNOSIS — R600 Localized edema: Secondary | ICD-10-CM

## 2018-12-28 DIAGNOSIS — R262 Difficulty in walking, not elsewhere classified: Secondary | ICD-10-CM | POA: Diagnosis present

## 2018-12-28 DIAGNOSIS — M25562 Pain in left knee: Secondary | ICD-10-CM | POA: Diagnosis not present

## 2018-12-28 DIAGNOSIS — M25662 Stiffness of left knee, not elsewhere classified: Secondary | ICD-10-CM | POA: Insufficient documentation

## 2018-12-28 NOTE — Therapy (Signed)
[place], [place], [address] Phone: [phone]   Fax:  [phone]  Physical Therapy Evaluation  Patient Details  Name: Alexandra [name] MRN: [id number] Date of Birth: [birth year] Referring Provider (PT): Dalldorf   Encounter Date: 12/28/2018  PT End of Session - 12/28/18 1600    Visit Number  1    Date for PT Re-Evaluation  02/27/19    PT Start Time  1528    PT Stop Time  1615    PT Time Calculation (min)  47 min    Activity Tolerance  Patient limited by pain    Behavior During Therapy  [facility] for tasks assessed/performed;Agitated;Anxious       Past Medical History:  Diagnosis Date  . Arthritis   . COPD (chronic obstructive pulmonary disease) ([place]) 05/22/2016  . Coronary artery disease, non-occlusive 11/28/2016   R&LHC: 65% distal LAD lesion -- likely not angiographically significant->medical management. Normal LVEF. Moderately elevated LVEDP. I aortic valve gradient in the Cath Lab, moderate aortic stenosis. This would suggest that the stenosis is on the moderate side of moderate to severe.  Severe pulmonary hypertension (likely mixed).  AVA 0.96 cm; P-P gradient ~ 30 mmHg, Mean ~24.5-27.5 (Moderate AS)   . GERD (gastroesophageal reflux disease)   . Heart murmur   . Hx of adenomatous colonic polyps 10/19/2015  . Hyperlipidemia   . Hypertension   . Moderate mitral regurgitation by prior echocardiography 10/2016   Echocardiogram revealed moderate mitral regurgitation  . Pulmonary hypertension associated Aortic Stenosis, Mitral Regurgitation & COPD    Peak PAP by Echo ~54 mmHg;  by Cath PAP/Mean 71/32 mmHg, 53 mmHg (with PCWP & LVEDP ~24-26 mmHg); TPG ~26 mmHg  . Pulmonary nodules   . S/P TAVR (transcatheter aortic valve replacement)   . Severe aortic stenosis     Past Surgical History:  Procedure Laterality Date  . APPENDECTOMY    . BREAST CYST EXCISION Right 1970  . CARDIAC VALVE REPLACEMENT     . CATARACT EXTRACTION W/ INTRAOCULAR LENS IMPLANT Right   . CHOLECYSTECTOMY OPEN    . COLONOSCOPY  2003, 2017  . CTA Chest  09/2016   Thoracic Aortic Ca2+ w/o dilation.  Coronary Calcification noted. PA normal. Mild Emphysematous changes w/ mild LLL scarring.    [name] FRACTURE SURGERY     2007 -right tib /fib fracture ----07/2009 right femur fx after a fall  . FRACTURE SURGERY Right 2011   Femur  . PERIPHERAL VASCULAR CATHETERIZATION N/A 08/09/2014   Procedure: Aortic Arch Angiography;  Surgeon: Serafina Mitchell, MD;  Location: [place] [facility];  Service: Cardiovascular: Type 1 Arch. No significant stenosis, aneurysmal degeneration or dissection.  No luminal irregularity seen in R or L SubClavian, Brachial or Radial A. Chronic distal ulnar A occlusion [place].  [name] PERIPHERAL VASCULAR CATHETERIZATION Right 08/09/2014   Procedure: Upper Extremity Angiography;  Surgeon: Serafina Mitchell, MD;  Location: [place] [facility];  Service: Cardiovascular;  Laterality: Right;  . RIGHT/LEFT HEART CATH AND CORONARY ANGIOGRAPHY N/A 12/06/2016   Procedure: RIGHT/LEFT HEART CATH AND CORONARY ANGIOGRAPHY;  Surgeon: Leonie Man, MD;  [facility]: Cor Angio: 65% dLAD (Med Rx). AVA 0.96 cm; P-P gradient ~ 30 mmHg, Mean ~24.5-27.5 (Mod AS); RHC #s: RAP 8 mmHg, RVP/EDP: 71/8/15 mmHg, PCWP: 21-24 mmHg, PAP/mean: 71/32/52 mmHg = Severe Mixed Pulmonary HTN; LVP/EDP 205/17/26 mmHg ; CO/CI by Fick: 4.35, 2.44 - mildly reduced  . RIGHT/LEFT  HEART CATH AND CORONARY ANGIOGRAPHY N/A 11/20/2017   Procedure: RIGHT/LEFT HEART CATH AND CORONARY ANGIOGRAPHY;  Surgeon: Larey Dresser, MD;  Location: [place] [facility];  Service: Cardiovascular;  Laterality: N/A;  . TEE WITHOUT CARDIOVERSION N/A 12/30/2017   Procedure: TRANSESOPHAGEAL ECHOCARDIOGRAM (TEE);  Surgeon: Burnell Blanks, MD;  Location: [place];  Service: Open Heart Surgery;  Laterality: N/A;  . TOTAL KNEE ARTHROPLASTY Left 12/01/2018   Procedure: Left  Knee Arthroplasty;  Surgeon: [place] Nakayama, MD;  Location: [facility];  Service: Orthopedics;  Laterality: Left;  . TRANSCATHETER AORTIC VALVE REPLACEMENT, TRANSFEMORAL  12/30/2017  . TRANSCATHETER AORTIC VALVE REPLACEMENT, TRANSFEMORAL N/A 12/30/2017   Procedure: TRANSCATHETER AORTIC VALVE REPLACEMENT, TRANSFEMORAL;  Surgeon: Burnell Blanks, MD;  Location: [place];  Service: Open Heart Surgery;  Laterality: N/A;  . TRANSTHORACIC ECHOCARDIOGRAM  10/30/2016   Mild Concentric LVH. EF 55-60%. No RWMA, Gr 2 DD. Mod-Severe AS (mean-peak Gradient 31 mmHG - 64 mmHg), Mod MR. Mod LA dilation, Mod PA HTN - peak pressure ~54 mmHg). = Progression of AS from 2014  . ULTRASOUND GUIDANCE FOR VASCULAR ACCESS  08/09/2014   Procedure: Ultrasound Guidance For Vascular Access;  Surgeon: Serafina Mitchell, MD;  Location: [place] [facility];  Service: Cardiovascular;;    There were no vitals filed for this visit.   Subjective Assessment - 12/28/18 1531    Subjective  Patient had a left TKR on 12/01/18.  She had a one night hospital stay.  She was to be set up with home PT but she reports that she was not ready for it and denied them coming out.  She comes in today saying she is really hurting and that she sawthe MD and feels that she may need a manipulation if she does not get it moving    Pertinent History  TAVR 12/2018, COPD, GERD, past ORIF of the femur, and tib/fib    Limitations  Standing;Lifting;Walking;House hold activities    Patient Stated Goals  have less pain and walk better    Currently in Pain?  Yes    Pain Score  9     Pain Location  Knee    Pain Orientation  Left    Pain Descriptors / Indicators  Aching    Pain Type  Acute pain;Surgical pain    Pain Radiating Towards  denies    Pain Onset  More than a month ago    Pain Frequency  Constant    Aggravating Factors   walking, bending, laying down pain up to 10/10    Pain Relieving Factors  ice, compression hose, percoset at best pain a 6/10     Effect of Pain on Daily Activities  limits everything         [facility] PT Assessment - 12/28/18 0001      Assessment   Medical Diagnosis  s/p left TKA    Referring Provider (PT)  Dalldorf    Onset Date/Surgical Date  12/01/18    Prior Therapy  no      Precautions   Precautions  None      Balance Screen   Has the patient fallen in the past 6 months  No    Has the patient had a decrease in activity level because of a fear of falling?   No    Is the patient reluctant to leave their home because of a fear of falling?   No      Home Environment   Living Environment  Private residence    Additional Comments  has stairs, some housework      Prior Function   Level of Independence  Independent    Vocation  Retired    Leisure  no exercise      Observation/Other Assessments-Edema    Edema  Circumferential      Circumferential Edema   Circumferential - Right  38.5 cm    Circumferential - Left   44cm      ROM / Strength   AROM / PROM / Strength  PROM      AROM   Right/Left Knee  Left    Left Knee Extension  29    Left Knee Flexion  70      PROM   PROM Assessment Site  Knee    Right/Left Knee  Left    Left Knee Extension  20    Left Knee Flexion  90      Strength   Overall Strength Comments  very poor, very painful      Palpation   Palpation comment  tender and painful in the left knee and the medial knee area, scar is tight, mild warmth      Ambulation/Gait   Gait Comments  with FWW, slow, stiff legged      Timed Up and Go Test   Normal TUG (seconds)  35                Objective measurements completed on examination: See above findings.      [facility] Adult PT Treatment/Exercise - 12/28/18 0001      Knee/Hip Exercises: Aerobic   Nustep  L1 x 5 minutes      Modalities   Modalities  Vasopneumatic      Vasopneumatic   Number Minutes Vasopneumatic   10 minutes    Vasopnuematic Location   Knee    Vasopneumatic Pressure  Medium    Vasopneumatic  Temperature   38             PT Education - 12/28/18 1600    Education Details  LLLD stretches for flexion and extension    Person(s) Educated  Patient    Methods  Explanation;Demonstration;Verbal cues;Handout    Comprehension  Verbalized understanding       PT Short Term Goals - 12/28/18 1603      PT SHORT TERM GOAL #1   Title  independent with intial HEP    Time  2    Period  Weeks    Status  New        PT Long Term Goals - 12/28/18 1603      PT LONG TERM GOAL #1   Title  decrease pain 50%    Time  8    Period  Weeks    Status  New      PT LONG TERM GOAL #2   Title  walk with SPC x 400 feet    Time  8    Period  Weeks    Status  New      PT LONG TERM GOAL #3   Title  increase AROM of the left knee to 10 - 110 degrees flexion    Time  12    Period  Weeks    Status  New      PT LONG TERM GOAL #4   Title  be able to cook a meal without having to sit and rest    Time  8  Period  Weeks      PT LONG TERM GOAL #5   Title  go up and down stairs step over step    Time  12    Period  Weeks    Status  New             Plan - 12/28/18 1601    Clinical Impression Statement  Patient underwent a left TKR on 12/01/18, she reports that she was not ready to move the knee so she cancelled PT at the home, she reports that she has been in a lot of pain, rates pain an 8-9/10 at all times, she is very timid in her movments but I was able to get her to allow some passive motions  AROM was 30-70 degrees.  The scar is tender and tight especially distally, she is walkin with a FWW, sloe and stiff legged, TUG was 33 seconds    Personal Factors and Comorbidities  Fitness;Comorbidity 3+    Comorbidities  TAVR, COPD, GERD, ORIF right LE    Examination-Activity Limitations  Transfers;Locomotion Level;Stand;Stairs;Squat;Sleep    Examination-Participation Restrictions  Cleaning;Shop;Laundry    Clinical Decision Making  Moderate    Rehab Potential  Good    PT Frequency  3x /  week    PT Duration  8 weeks    PT Treatment/Interventions  ADLs/Self Care Home Management;Cryotherapy;Electrical Stimulation;Moist Heat;Ultrasound;Gait training;Stair training;Functional mobility training;Therapeutic activities;Therapeutic exercise;Balance training;Neuromuscular re-education;Patient/family education;Manual techniques;Vasopneumatic Device;Scar mobilization    PT Next Visit Plan  Start activity to gain ROM, strength and function    Consulted and Agree with Plan of Care  Patient       Patient will benefit from skilled therapeutic intervention in order to improve the following deficits and impairments:  Abnormal gait, Decreased range of motion, Difficulty walking, Decreased activity tolerance, Pain, Impaired flexibility, Decreased balance, Decreased mobility, Decreased strength, Increased edema  Visit Diagnosis: Acute pain of left knee - Plan: PT plan of care cert/re-cert  Stiffness of left knee, not elsewhere classified - Plan: PT plan of care cert/re-cert  Difficulty in walking, not elsewhere classified - Plan: PT plan of care cert/re-cert  Localized edema - Plan: PT plan of care cert/re-cert     Problem List Patient Active Problem List   Diagnosis Date Noted  . Primary osteoarthritis of left knee 12/01/2018  . Preop cardiovascular exam 05/13/2018  . Chronic diastolic (congestive) heart failure ([place]) 12/30/2017  . GERD (gastroesophageal reflux disease)   . Hyperlipidemia   . S/P TAVR (transcatheter aortic valve replacement)   . Severe aortic stenosis   . Pulmonary nodules   . PAT (paroxysmal atrial tachycardia) ([place]) 02/07/2017  . Chronic fatigue 02/07/2017  . Pulmonary hypertension: Combined secondary as well as lung disease 12/28/2016  . Coronary artery disease, non-occlusive 11/28/2016  . Smoker 06/25/2016  . COPD (chronic obstructive pulmonary disease) ([place]) 05/22/2016  . Osteopenia 08/15/2015  . Chronic pain syndrome 07/16/2015  . Vitamin D deficiency  05/16/2015  . Essential hypertension 07/29/2014    Sumner Boast., PT 12/28/2018, 4:10 PM  [place] [address] [place], [place], [address] Phone: [phone]   Fax:  [phone]  Name: Alexandra [name] MRN: [id number] Date of Birth: 03-Oct-1950

## 2018-12-30 ENCOUNTER — Ambulatory Visit: Payer: Medicare Other | Admitting: Physical Therapy

## 2018-12-30 ENCOUNTER — Other Ambulatory Visit: Payer: Self-pay

## 2018-12-30 ENCOUNTER — Encounter: Payer: Self-pay | Admitting: Physical Therapy

## 2018-12-30 DIAGNOSIS — M25562 Pain in left knee: Secondary | ICD-10-CM | POA: Diagnosis not present

## 2018-12-30 DIAGNOSIS — M25662 Stiffness of left knee, not elsewhere classified: Secondary | ICD-10-CM

## 2018-12-30 DIAGNOSIS — R262 Difficulty in walking, not elsewhere classified: Secondary | ICD-10-CM

## 2018-12-30 DIAGNOSIS — R600 Localized edema: Secondary | ICD-10-CM

## 2018-12-30 NOTE — Therapy (Signed)
[place], [place], [address] Phone: [phone]   Fax:  [phone]  Physical Therapy Treatment  Patient Details  Name: Alexandra [name] MRN: [id number] Date of Birth: 10/31/1950 Referring Provider (PT): Dalldorf   Encounter Date: 12/30/2018  PT End of Session - 12/30/18 1057    Visit Number  2    Date for PT Re-Evaluation  02/27/19    PT Start Time  [id number]    PT Stop Time  1056    PT Time Calculation (min)  41 min    Activity Tolerance  Patient tolerated treatment well    Behavior During Therapy  [facility] for tasks assessed/performed;Anxious       Past Medical History:  Diagnosis Date  . Arthritis   . COPD (chronic obstructive pulmonary disease) ([place]) 05/22/2016  . Coronary artery disease, non-occlusive 11/28/2016   R&LHC: 65% distal LAD lesion -- likely not angiographically significant->medical management. Normal LVEF. Moderately elevated LVEDP. I aortic valve gradient in the Cath Lab, moderate aortic stenosis. This would suggest that the stenosis is on the moderate side of moderate to severe.  Severe pulmonary hypertension (likely mixed).  AVA 0.96 cm; P-P gradient ~ 30 mmHg, Mean ~24.5-27.5 (Moderate AS)   . GERD (gastroesophageal reflux disease)   . Heart murmur   . Hx of adenomatous colonic polyps 10/19/2015  . Hyperlipidemia   . Hypertension   . Moderate mitral regurgitation by prior echocardiography 10/2016   Echocardiogram revealed moderate mitral regurgitation  . Pulmonary hypertension associated Aortic Stenosis, Mitral Regurgitation & COPD    Peak PAP by Echo ~54 mmHg;  by Cath PAP/Mean 71/32 mmHg, 53 mmHg (with PCWP & LVEDP ~24-26 mmHg); TPG ~26 mmHg  . Pulmonary nodules   . S/P TAVR (transcatheter aortic valve replacement)   . Severe aortic stenosis     Past Surgical History:  Procedure Laterality Date  . APPENDECTOMY    . BREAST CYST EXCISION Right 1970  . CARDIAC VALVE REPLACEMENT     . CATARACT EXTRACTION W/ INTRAOCULAR LENS IMPLANT Right   . CHOLECYSTECTOMY OPEN    . COLONOSCOPY  2003, 2017  . CTA Chest  09/2016   Thoracic Aortic Ca2+ w/o dilation.  Coronary Calcification noted. PA normal. Mild Emphysematous changes w/ mild LLL scarring.    [name] FRACTURE SURGERY     2007 -right tib /fib fracture ----07/2009 right femur fx after a fall  . FRACTURE SURGERY Right 2011   Femur  . PERIPHERAL VASCULAR CATHETERIZATION N/A 08/09/2014   Procedure: Aortic Arch Angiography;  Surgeon: Serafina Mitchell, MD;  Location: [place] [facility];  Service: Cardiovascular: Type 1 Arch. No significant stenosis, aneurysmal degeneration or dissection.  No luminal irregularity seen in R or L SubClavian, Brachial or Radial A. Chronic distal ulnar A occlusion [place].  [name] PERIPHERAL VASCULAR CATHETERIZATION Right 08/09/2014   Procedure: Upper Extremity Angiography;  Surgeon: Serafina Mitchell, MD;  Location: [place] [facility];  Service: Cardiovascular;  Laterality: Right;  . RIGHT/LEFT HEART CATH AND CORONARY ANGIOGRAPHY N/A 12/06/2016   Procedure: RIGHT/LEFT HEART CATH AND CORONARY ANGIOGRAPHY;  Surgeon: Leonie Man, MD;  [facility]: Cor Angio: 65% dLAD (Med Rx). AVA 0.96 cm; P-P gradient ~ 30 mmHg, Mean ~24.5-27.5 (Mod AS); RHC #s: RAP 8 mmHg, RVP/EDP: 71/8/15 mmHg, PCWP: 21-24 mmHg, PAP/mean: 71/32/52 mmHg = Severe Mixed Pulmonary HTN; LVP/EDP 205/17/26 mmHg ; CO/CI by Fick: 4.35, 2.44 - mildly reduced  . RIGHT/LEFT  HEART CATH AND CORONARY ANGIOGRAPHY N/A 11/20/2017   Procedure: RIGHT/LEFT HEART CATH AND CORONARY ANGIOGRAPHY;  Surgeon: Larey Dresser, MD;  Location: [place] [facility];  Service: Cardiovascular;  Laterality: N/A;  . TEE WITHOUT CARDIOVERSION N/A 12/30/2017   Procedure: TRANSESOPHAGEAL ECHOCARDIOGRAM (TEE);  Surgeon: Burnell Blanks, MD;  Location: [facility];  Service: Open Heart Surgery;  Laterality: N/A;  . TOTAL KNEE ARTHROPLASTY Left 12/01/2018   Procedure: Left  Knee Arthroplasty;  Surgeon: [place] Nakayama, MD;  [facility];  Service: Orthopedics;  Laterality: Left;  . TRANSCATHETER AORTIC VALVE REPLACEMENT, TRANSFEMORAL  12/30/2017  . TRANSCATHETER AORTIC VALVE REPLACEMENT, TRANSFEMORAL N/A 12/30/2017   Procedure: TRANSCATHETER AORTIC VALVE REPLACEMENT, TRANSFEMORAL;  Surgeon: Burnell Blanks, MD;  Location: [facility];  Service: Open Heart Surgery;  Laterality: N/A;  . TRANSTHORACIC ECHOCARDIOGRAM  10/30/2016   Mild Concentric LVH. EF 55-60%. No RWMA, Gr 2 DD. Mod-Severe AS (mean-peak Gradient 31 mmHG - 64 mmHg), Mod MR. Mod LA dilation, Mod PA HTN - peak pressure ~54 mmHg). = Progression of AS from 2014  . ULTRASOUND GUIDANCE FOR VASCULAR ACCESS  08/09/2014   Procedure: Ultrasound Guidance For Vascular Access;  Surgeon: Serafina Mitchell, MD;  Location: [facility] [facility];  Service: Cardiovascular;;    There were no vitals filed for this visit.  Subjective Assessment - 12/30/18 1021    Subjective  Pt stated she is having 7/10 pain in the left knee. "it is hurting today".    Currently in Pain?  Yes    Pain Score  7     Pain Location  Knee    Pain Orientation  Left    Pain Descriptors / Indicators  Tightness         [facility] PT Assessment - 12/30/18 0001      ROM / Strength   AROM / PROM / Strength  PROM      AROM   Left Knee Extension  17    Left Knee Flexion  100                   [facility] Adult PT Treatment/Exercise - 12/30/18 0001      Exercises   Exercises  Knee/Hip      Knee/Hip Exercises: Aerobic   Nustep  L1 x 6 minutes      Knee/Hip Exercises: Seated   Long Arc Quad  Left;2 sets;10 reps    Other Seated Knee/Hip Exercises  Quad sets LLE x10     Hamstring Curl  Left;2 sets;10 reps    Hamstring Limitations  yellow tband      Knee/Hip Exercises: Supine   Short Arc Quad Sets  Strengthening;Left;2 sets;10 reps;AROM      Manual Therapy   Manual Therapy  Passive ROM;Joint mobilization    Manual therapy comments   Some PROM taken to end range and held     Joint Mobilization  Anterior/posterior grades 1     Passive ROM  L knee flex and Ext                PT Short Term Goals - 12/28/18 1603      PT SHORT TERM GOAL #1   Title  independent with intial HEP    Time  2    Period  Weeks    Status  New        PT Long Term Goals - 12/28/18 1603      PT LONG TERM GOAL #1   Title  decrease pain 50%    Time  8    Period  Weeks    Status  New      PT LONG TERM GOAL #2   Title  walk with SPC x 400 feet    Time  8    Period  Weeks    Status  New      PT LONG TERM GOAL #3   Title  increase AROM of the left knee to 10 - 110 degrees flexion    Time  12    Period  Weeks    Status  New      PT LONG TERM GOAL #4   Title  be able to cook a meal without having to sit and rest    Time  8    Period  Weeks      PT LONG TERM GOAL #5   Title  go up and down stairs step over step    Time  12    Period  Weeks    Status  New            Plan - 12/30/18 1058    Clinical Impression Statement  Pt tolerated treatment well despite being anxious.  She did report a slight increase in pain with L knee active extension. Pt requires cues for hamstring curls and SAQ to increase ROM and for good quad contraction. Pt making progress with PROM of knee flexion/extension. CGA needed for standing wt shifts Pt denied modality post treatment, she reports that she will ice at home.    Personal Factors and Comorbidities  Fitness;Comorbidity 3+    Comorbidities  TAVR, COPD, GERD, ORIF right LE    Examination-Activity Limitations  Transfers;Locomotion Level;Stand;Stairs;Squat;Sleep    Examination-Participation Restrictions  Cleaning;Shop;Laundry    Rehab Potential  Good    PT Frequency  3x / week    PT Duration  8 weeks    PT Treatment/Interventions  ADLs/Self Care Home Management;Cryotherapy;Electrical Stimulation;Moist Heat;Ultrasound;Gait training;Stair training;Functional mobility training;Therapeutic  activities;Therapeutic exercise;Balance training;Neuromuscular re-education;Patient/family education;Manual techniques;Vasopneumatic Device;Scar mobilization    PT Next Visit Plan  L knee ROM, strength and function       Patient will benefit from skilled therapeutic intervention in order to improve the following deficits and impairments:  Abnormal gait, Decreased range of motion, Difficulty walking, Decreased activity tolerance, Pain, Impaired flexibility, Decreased balance, Decreased mobility, Decreased strength, Increased edema  Visit Diagnosis: Stiffness of left knee, not elsewhere classified  Acute pain of left knee  Difficulty in walking, not elsewhere classified  Localized edema     Problem List Patient Active Problem List   Diagnosis Date Noted  . Primary osteoarthritis of left knee 12/01/2018  . Preop cardiovascular exam 05/13/2018  . Chronic diastolic (congestive) heart failure ([place]) 12/30/2017  . GERD (gastroesophageal reflux disease)   . Hyperlipidemia   . S/P TAVR (transcatheter aortic valve replacement)   . Severe aortic stenosis   . Pulmonary nodules   . PAT (paroxysmal atrial tachycardia) ([place]) 02/07/2017  . Chronic fatigue 02/07/2017  . Pulmonary hypertension: Combined secondary as well as lung disease 12/28/2016  . Coronary artery disease, non-occlusive 11/28/2016  . Smoker 06/25/2016  . COPD (chronic obstructive pulmonary disease) ([place]) 05/22/2016  . Osteopenia 08/15/2015  . Chronic pain syndrome 07/16/2015  . Vitamin D deficiency 05/16/2015  . Essential hypertension 07/29/2014    Scot Jun, PTA 12/30/2018, 11:09 AM  [place], [place], [address]  Phone: [phone]   Fax:  [phone]  Name: Alexandra [name] MRN: [id number] Date of Birth: 1950/08/20

## 2018-12-31 ENCOUNTER — Other Ambulatory Visit (HOSPITAL_COMMUNITY): Payer: Medicare Other

## 2018-12-31 ENCOUNTER — Ambulatory Visit: Payer: Medicare Other | Admitting: Physician Assistant

## 2018-12-31 ENCOUNTER — Other Ambulatory Visit: Payer: Self-pay

## 2018-12-31 NOTE — Progress Notes (Deleted)
[facility]                                       Cardiology Office Note    Date:  12/31/2018   ID:  DODY DINGA, DOB Jun 03, 1950, MRN [id number]  PCP:  [facility]  Cardiologist: Dr. Ellyn Hack Dr. Angelena Form & Dr. Cyndia Bent (TAVR)  CC: 1 year s/p TAVR   History of Present Illness:  Alexandra [name] is a 68 y.o. female with a history of HTN, HLD,COPD, chronic diastolic CHF, pulmonary HTN, LBBB, ongoing tobacco abuseandsevereaortic stenosiss/p TAVR (12/30/17) who presents to clinic for follow up.  An echo in 10/2016 showed a mean aortic valve gradient of 31 mmHg with a dimensionless index of 0.24. She was seen in our office at that time by Dr. Prescott Gum who was seeing her for a right lower lobe nodular density which resolved on subsequent CT scan. She was not having any clear symptoms of congestive heart failure and was therefore referred back to cardiology for follow-up. Over the past several months she developed exertional fatigue and shortness of breath as well as two-pillow orthopnea. She has been followed by Dr. Aundra Dubin and was treated for volume overload with Lasix. She had a follow-up echocardiogram on 10/31/2017 showed normal LV function with severe AS with a mean valve gradient of 32 mmHg with a peak of 62 mmHg.  R/LHC 11/20/2017 showed nonobstructive coronary disease, mild pulmonary hypertension and low right heart pressures. Lasix was reduced.  She was evaluated by the multidisciplinary valve team and ultimately underwent successful TAVR with a26mm Edwards Sapien 3 THV via the TF approach on 12/30/17. Post operative echo showed EF 60%, normally functioning TAVR valve with mean gradient of 22 mm Hg and no PVL. Diltiazem was discontinued at discharge given sinus bradycardia.  She underwent left knee arthroplasty on 12/01/18.  Today she presents to clinic for follow up.    Past Medical History:  Diagnosis  Date  . Arthritis   . COPD (chronic obstructive pulmonary disease) ([place]) 05/22/2016  . Coronary artery disease, non-occlusive 11/28/2016   R&LHC: 65% distal LAD lesion -- likely not angiographically significant->medical management. Normal LVEF. Moderately elevated LVEDP. I aortic valve gradient in the Cath Lab, moderate aortic stenosis. This would suggest that the stenosis is on the moderate side of moderate to severe.  Severe pulmonary hypertension (likely mixed).  AVA 0.96 cm; P-P gradient ~ 30 mmHg, Mean ~24.5-27.5 (Moderate AS)   . GERD (gastroesophageal reflux disease)   . Heart murmur   . Hx of adenomatous colonic polyps 10/19/2015  . Hyperlipidemia   . Hypertension   . Moderate mitral regurgitation by prior echocardiography 10/2016   Echocardiogram revealed moderate mitral regurgitation  . Pulmonary hypertension associated Aortic Stenosis, Mitral Regurgitation & COPD    Peak PAP by Echo ~54 mmHg;  by Cath PAP/Mean 71/32 mmHg, 53 mmHg (with PCWP & LVEDP ~24-26 mmHg); TPG ~26 mmHg  . Pulmonary nodules   . S/P TAVR (transcatheter aortic valve replacement)   . Severe aortic stenosis     Past Surgical History:  Procedure Laterality Date  . APPENDECTOMY    . BREAST CYST EXCISION Right 1970  . CARDIAC VALVE REPLACEMENT    . CATARACT EXTRACTION W/ INTRAOCULAR LENS IMPLANT Right   . CHOLECYSTECTOMY OPEN    . COLONOSCOPY  2003, 2017  .  CTA Chest  09/2016   Thoracic Aortic Ca2+ w/o dilation.  Coronary Calcification noted. PA normal. Mild Emphysematous changes w/ mild LLL scarring.    [name] FRACTURE SURGERY     2007 -right tib /fib fracture ----07/2009 right femur fx after a fall  . FRACTURE SURGERY Right 2011   Femur  . PERIPHERAL VASCULAR CATHETERIZATION N/A 08/09/2014   Procedure: Aortic Arch Angiography;  Surgeon: Serafina Mitchell, MD;  Location: [facility];  Service: Cardiovascular: Type 1 Arch. No significant stenosis, aneurysmal degeneration or dissection.  No luminal irregularity  seen in R or L SubClavian, Brachial or Radial A. Chronic distal ulnar A occlusion Bilatera.  [name] PERIPHERAL VASCULAR CATHETERIZATION Right 08/09/2014   Procedure: Upper Extremity Angiography;  Surgeon: Serafina Mitchell, MD;  Location: [place];  Service: Cardiovascular;  Laterality: Right;  . RIGHT/LEFT HEART CATH AND CORONARY ANGIOGRAPHY N/A 12/06/2016   Procedure: RIGHT/LEFT HEART CATH AND CORONARY ANGIOGRAPHY;  Surgeon: Leonie Man, MD;  [facility]: Cor Angio: 65% dLAD (Med Rx). AVA 0.96 cm; P-P gradient ~ 30 mmHg, Mean ~24.5-27.5 (Mod AS); RHC #s: RAP 8 mmHg, RVP/EDP: 71/8/15 mmHg, PCWP: 21-24 mmHg, PAP/mean: 71/32/52 mmHg = Severe Mixed Pulmonary HTN; LVP/EDP 205/17/26 mmHg ; CO/CI by Fick: 4.35, 2.44 - mildly reduced  . RIGHT/LEFT HEART CATH AND CORONARY ANGIOGRAPHY N/A 11/20/2017   Procedure: RIGHT/LEFT HEART CATH AND CORONARY ANGIOGRAPHY;  Surgeon: Larey Dresser, MD;  Location: [facility];  Service: Cardiovascular;  Laterality: N/A;  . TEE WITHOUT CARDIOVERSION N/A 12/30/2017   Procedure: TRANSESOPHAGEAL ECHOCARDIOGRAM (TEE);  Surgeon: Burnell Blanks, MD;  Location: [facility];  Service: Open Heart Surgery;  Laterality: N/A;  . TOTAL KNEE ARTHROPLASTY Left 12/01/2018   Procedure: Left Knee Arthroplasty;  Surgeon: Melrose Nakayama, MD;  Location: [facility];  Service: Orthopedics;  Laterality: Left;  . TRANSCATHETER AORTIC VALVE REPLACEMENT, TRANSFEMORAL  12/30/2017  . TRANSCATHETER AORTIC VALVE REPLACEMENT, TRANSFEMORAL N/A 12/30/2017   Procedure: TRANSCATHETER AORTIC VALVE REPLACEMENT, TRANSFEMORAL;  Surgeon: Burnell Blanks, MD;  Location: [facility];  Service: Open Heart Surgery;  Laterality: N/A;  . TRANSTHORACIC ECHOCARDIOGRAM  10/30/2016   Mild Concentric LVH. EF 55-60%. No RWMA, Gr 2 DD. Mod-Severe AS (mean-peak Gradient 31 mmHG - 64 mmHg), Mod MR. Mod LA dilation, Mod PA HTN - peak pressure ~54 mmHg). = Progression of AS from 2014  . ULTRASOUND  GUIDANCE FOR VASCULAR ACCESS  08/09/2014   Procedure: Ultrasound Guidance For Vascular Access;  Surgeon: Serafina Mitchell, MD;  Location: [facility];  Service: Cardiovascular;;    Current Medications: Outpatient Medications Prior to Visit  Medication Sig Dispense Refill  . amLODipine (NORVASC) 10 MG tablet Take 1 tablet (10 mg total) by mouth daily. (Patient not taking: Reported on 11/23/2018) 90 tablet 3  . amoxicillin (AMOXIL) 500 MG tablet Take 2,000 mg (four tablets) one hour prior to dental visits. 8 tablet 3  . aspirin 81 MG tablet Take 1 tablet (81 mg total) by mouth 2 (two) times daily at 10 AM and 5 PM. 30 tablet 0  . Azelastine HCl 0.15 % SOLN Place 2 sprays into the nose 2 (two) times daily as needed (allergies). 30 mL PRN  . bisoprolol-hydrochlorothiazide (ZIAC) 10-6.25 MG tablet Take 1 tablet by mouth daily. 30 tablet 6  . clopidogrel (PLAVIX) 75 MG tablet TAKE 1 TABLET(75 MG) BY MOUTH DAILY WITH BREAKFAST (Patient not taking: Reported on 11/23/2018) 90 tablet 1  . fluticasone (FLOVENT  HFA) 220 MCG/ACT inhaler Inhale 2 puffs into the lungs 2 (two) times daily as needed (wheezing and shortness of breath).     . furosemide (LASIX) 20 MG tablet Take 1 tablet (20 mg total) by mouth 2 (two) times daily. (Patient taking differently: Take 20 mg by mouth daily. ) 60 tablet 3  . losartan (COZAAR) 100 MG tablet Take 1 tablet (100 mg total) by mouth daily. 90 tablet 3  . montelukast (SINGULAIR) 10 MG tablet Take 1 tablet (10 mg total) by mouth at bedtime. (Patient not taking: Reported on 11/23/2018) 30 tablet 3  . oxybutynin (DITROPAN-XL) 10 MG 24 hr tablet TAKE 1 TABLET BY MOUTH AT BEDTIME (Patient taking differently: Take 10 mg by mouth at bedtime as needed (urinary urgency). ) 90 tablet 0  . oxyCODONE-acetaminophen (PERCOCET) 10-325 MG tablet Take 1-2 tablets by mouth every 6 (six) hours as needed for pain. 40 tablet 0  . potassium chloride SA (K-DUR,KLOR-CON) 20 MEQ tablet Take 1 tablet  (20 mEq total) by mouth daily. 30 tablet 6  . promethazine (PHENERGAN) 12.5 MG tablet Take 1-2 tablets (12.5-25 mg total) by mouth every 6 (six) hours as needed for nausea or vomiting. 30 tablet 1  . rosuvastatin (CRESTOR) 10 MG tablet Take 1 tablet (10 mg total) by mouth daily. (Patient taking differently: Take 10 mg by mouth daily after supper. ) 90 tablet 3  . tiZANidine (ZANAFLEX) 4 MG tablet Take 1 tablet (4 mg total) by mouth every 6 (six) hours as needed. 40 tablet 1  . Vitamin D, Ergocalciferol, (DRISDOL) 1.25 MG (50000 UT) CAPS capsule Take 50,000 Units by mouth every 7 (seven) days.     No facility-administered medications prior to visit.      Allergies:   Codeine and Symproic [naldemedine]   Social History   Socioeconomic History  . Marital status: Married    Spouse name: [name]  . Number of children: 3  . Years of education: 12th grade  . Highest education level: Not on file  Occupational History  . 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 Financial resource strain: Not on file  . Food insecurity    Worry: Not on file    Inability: Not on file  . Transportation needs    Medical: Not on file    Non-medical: Not on file  Tobacco Use  . Smoking status: Light Tobacco Smoker    Packs/day: 0.10    Years: 40.00    Pack years: 4.00    Types: Cigarettes  . Smokeless tobacco: Never Used  . Tobacco comment: 1-2 cigarettes a day  Substance and Sexual Activity  . Alcohol use: Not Currently    Alcohol/week: 0.0 standard drinks  . Drug use: Never    Comment: +cocaine in urine 12/19/2014; pt denies this hx on 12/30/2017  . Sexual activity: Not Currently    Partners: Male  Lifestyle  . Physical activity    Days per week: Not on file    Minutes per session: Not on file  . Stress: Not on file  Relationships  . Social Herbalist on phone: Not on file    Gets together: Not on  file    Attends religious service: Not on file    Active member of club or organization: Not on file    Attends meetings of clubs or organizations: Not on file    Relationship status: Not  on file  Other Topics Concern  . Not on file  Social History Narrative   Lives with her husband and her middle son.   Her two older sons are products of a previous relationship.   Her youngest son is from her current marriage.   Oldest and youngest sons live nearby.   She has total of 3 children and 6 grandchildren with 2 great-grandchildren.   She does not exercise because she has pain in her right leg.     Family History:  The patient's family history includes Heart attack in her brother; Heart disease in her father and mother; Hypertension in her mother.      ROS:   Please see the history of present illness.    ROS All other systems reviewed and are negative.   PHYSICAL EXAM:   VS:  There were no vitals taken for this visit.   GEN: Well nourished, well developed, in no acute distress HEENT: normal Neck: no JVD or masses Cardiac: RRR; ***. No rubs, or gallops,no edema  Respiratory:  clear to auscultation bilaterally, normal work of breathing GI: soft, nontender, nondistended, + BS MS: no deformity or atrophy Skin: warm and dry, no rash Neuro:  Alert and Oriented x 3, Strength and sensation are intact Psych: euthymic mood, full affect   Wt Readings from Last 3 Encounters:  12/01/18 157 lb 2 oz (71.3 kg)  11/26/18 157 lb 2 oz (71.3 kg)  06/04/18 155 lb 9.6 oz (70.6 kg)      Studies/Labs Reviewed:   EKG:  EKG is NOT ordered today.    Recent Labs: 01/14/2018: TSH 1.620 11/26/2018: BUN 19; Creatinine, Ser 1.15; Hemoglobin 12.8; Platelets 186; Potassium 4.1; Sodium 137   Lipid Panel    Component Value Date/Time   CHOL 133 03/05/2018 1135   CHOL 234 (H) 06/17/2017 1116   TRIG 109 03/05/2018 1135   HDL 41 03/05/2018 1135   HDL 39 (L) 06/17/2017 1116   CHOLHDL 3.2 03/05/2018 1135    VLDL 22 03/05/2018 1135   LDLCALC 70 03/05/2018 1135   LDLCALC 166 (H) 06/17/2017 1116    Additional studies/ records that were reviewed today include:   TAVR OPERATIVE NOTE   Date of Procedure:12/30/2017  Preoperative Diagnosis:Severe Aortic Stenosis   Postoperative Diagnosis:Same   Procedure:   Transcatheter Aortic Valve Replacement - Transfemoral Approach Edwards Sapien 3 THV (size 40mm, model # [id number], serial [id number])  Co-Surgeons:Christopher Angelena Form, MD and Gaye Pollack, MD   Pre-operative Echo Findings: ? Severe aortic stenosis ? Normalleft ventricular systolic function  Post-operative Echo Findings: ? Mildparavalvular leak ? Normalleft ventricular systolic function  _______________  Echo 02/04/18  Study Conclusions - Left ventricle: The cavity size was normal. There was mild focal   basal hypertrophy of the septum. Systolic function was normal.   The estimated ejection fraction was in the range of 55% to 60%.   Wall motion was normal; there were no regional wall motion   abnormalities. Doppler parameters are consistent with abnormal   left ventricular relaxation (grade 1 diastolic dysfunction). - Aortic valve: A prosthesis was present and functioning normally.   The prosthesis had a normal range of motion. The sewing ring   appeared normal, had no rocking motion, and showed no evidence of   dehiscence. There was mild stenosis. Mean gradient (S): 19 mm Hg.   Peak gradient (S): 36 mm Hg. - Mitral valve: Calcified annulus. There was mild to moderate   regurgitation. - Left atrium: The atrium  was severely dilated. - Right ventricle: Systolic function was normal. - Atrial septum: No defect or patent foramen ovale was identified. - Tricuspid valve: There was trivial regurgitation. - Pulmonic valve: There was no significant regurgitation. - Pulmonary arteries: PA peak  pressure: 33 mm Hg (S). Impressions: - S/P TAVR. Valve mean gradient has gone from 7 (procedure)->25->19   mmHg and peak gradient from 16 (procedure) ->45 -> 36 mmHg.   Otherwise study largely unchanged from prior.   __________________   Echo 12/31/18 ***   ASSESSMENT & PLAN:   Severe AS s/p TAVR:   HTN:  Chronic diastolic CHF:  COPD:   Pulmonary nodules: multiple small pulmonary nodules noted throughout the lungs bilaterally, largest of which measures 5 mm in the right lower lobe.seen on pre op TAVR scans. She has been followed for a right pulmonary nodule in the past that apparently resolved on its own. Radiology has recommended follow up with a non contrast CT at 12 months if high risk (she is a long time, heavy smoker.) I had this set up for her but she canceled it. We will have this rescheduled.    Medication Adjustments/Labs and Tests Ordered: Current medicines are reviewed at length with the patient today.  Concerns regarding medicines are outlined above.  Medication changes, Labs and Tests ordered today are listed in the Patient Instructions below. There are no Patient Instructions on file for this visit.   Signed, Angelena Form, PA-C  12/31/2018 12:15 PM    [facility] [place], [place], [place]  [address] Phone: [phone]; Fax: [phone]

## 2019-01-01 ENCOUNTER — Other Ambulatory Visit: Payer: Self-pay

## 2019-01-01 ENCOUNTER — Ambulatory Visit: Payer: Medicare Other | Admitting: Physical Therapy

## 2019-01-01 DIAGNOSIS — M25562 Pain in left knee: Secondary | ICD-10-CM | POA: Diagnosis not present

## 2019-01-01 DIAGNOSIS — M25662 Stiffness of left knee, not elsewhere classified: Secondary | ICD-10-CM

## 2019-01-01 NOTE — Therapy (Cosign Needed)
[facility] [place], [place], [address] Phone: [phone]   Fax:  [phone]  Physical Therapy Treatment  Patient Details  Name: Alexandra [name] MRN: [id number] Date of Birth: June 02, 1950 Referring Provider (PT): Dalldorf   Encounter Date: 01/01/2019  PT End of Session - 01/01/19 1106    Visit Number  3    Date for PT Re-Evaluation  02/27/19    PT Start Time  1020    PT Stop Time  1115    PT Time Calculation (min)  55 min       Past Medical History:  Diagnosis Date  . Arthritis   . COPD (chronic obstructive pulmonary disease) ([place]) 05/22/2016  . Coronary artery disease, non-occlusive 11/28/2016   R&LHC: 65% distal LAD lesion -- likely not angiographically significant->medical management. Normal LVEF. Moderately elevated LVEDP. I aortic valve gradient in the Cath Lab, moderate aortic stenosis. This would suggest that the stenosis is on the moderate side of moderate to severe.  Severe pulmonary hypertension (likely mixed).  AVA 0.96 cm; P-P gradient ~ 30 mmHg, Mean ~24.5-27.5 (Moderate AS)   . GERD (gastroesophageal reflux disease)   . Heart murmur   . Hx of adenomatous colonic polyps 10/19/2015  . Hyperlipidemia   . Hypertension   . Moderate mitral regurgitation by prior echocardiography 10/2016   Echocardiogram revealed moderate mitral regurgitation  . Pulmonary hypertension associated Aortic Stenosis, Mitral Regurgitation & COPD    Peak PAP by Echo ~54 mmHg;  by Cath PAP/Mean 71/32 mmHg, 53 mmHg (with PCWP & LVEDP ~24-26 mmHg); TPG ~26 mmHg  . Pulmonary nodules   . S/P TAVR (transcatheter aortic valve replacement)   . Severe aortic stenosis     Past Surgical History:  Procedure Laterality Date  . APPENDECTOMY    . BREAST CYST EXCISION Right 1970  . CARDIAC VALVE REPLACEMENT    . CATARACT EXTRACTION W/ INTRAOCULAR LENS IMPLANT Right   . CHOLECYSTECTOMY OPEN    . COLONOSCOPY  2003, 2017  . CTA  Chest  09/2016   Thoracic Aortic Ca2+ w/o dilation.  Coronary Calcification noted. PA normal. Mild Emphysematous changes w/ mild LLL scarring.    [name] FRACTURE SURGERY     2007 -right tib /fib fracture ----07/2009 right femur fx after a fall  . FRACTURE SURGERY Right 2011   Femur  . PERIPHERAL VASCULAR CATHETERIZATION N/A 08/09/2014   Procedure: Aortic Arch Angiography;  Surgeon: Serafina Mitchell, MD;  Location: [facility];  Service: Cardiovascular: Type 1 Arch. No significant stenosis, aneurysmal degeneration or dissection.  No luminal irregularity seen in R or L SubClavian, Brachial or Radial A. Chronic distal ulnar A occlusion Bilatera.  [name] PERIPHERAL VASCULAR CATHETERIZATION Right 08/09/2014   Procedure: Upper Extremity Angiography;  Surgeon: Serafina Mitchell, MD;  Location: [facility];  Service: Cardiovascular;  Laterality: Right;  . RIGHT/LEFT HEART CATH AND CORONARY ANGIOGRAPHY N/A 12/06/2016   Procedure: RIGHT/LEFT HEART CATH AND CORONARY ANGIOGRAPHY;  Surgeon: Leonie Man, MD;  [facility]: Cor Angio: 65% dLAD (Med Rx). AVA 0.96 cm; P-P gradient ~ 30 mmHg, Mean ~24.5-27.5 (Mod AS); RHC #s: RAP 8 mmHg, RVP/EDP: 71/8/15 mmHg, PCWP: 21-24 mmHg, PAP/mean: 71/32/52 mmHg = Severe Mixed Pulmonary HTN; LVP/EDP 205/17/26 mmHg ; CO/CI by Fick: 4.35, 2.44 - mildly reduced  . RIGHT/LEFT HEART CATH AND CORONARY ANGIOGRAPHY N/A 11/20/2017   Procedure: RIGHT/LEFT HEART CATH AND CORONARY ANGIOGRAPHY;  Surgeon: Larey Dresser,  MD;  Location: [facility];  Service: Cardiovascular;  Laterality: N/A;  . TEE WITHOUT CARDIOVERSION N/A 12/30/2017   Procedure: TRANSESOPHAGEAL ECHOCARDIOGRAM (TEE);  Surgeon: Burnell Blanks, MD;  Location: [facility];  Service: Open Heart Surgery;  Laterality: N/A;  . TOTAL KNEE ARTHROPLASTY Left 12/01/2018   Procedure: Left Knee Arthroplasty;  Surgeon: Melrose Nakayama, MD;  [facility];  Service: Orthopedics;  Laterality: Left;  .  TRANSCATHETER AORTIC VALVE REPLACEMENT, TRANSFEMORAL  12/30/2017  . TRANSCATHETER AORTIC VALVE REPLACEMENT, TRANSFEMORAL N/A 12/30/2017   Procedure: TRANSCATHETER AORTIC VALVE REPLACEMENT, TRANSFEMORAL;  Surgeon: Burnell Blanks, MD;  Location: [facility];  Service: Open Heart Surgery;  Laterality: N/A;  . TRANSTHORACIC ECHOCARDIOGRAM  10/30/2016   Mild Concentric LVH. EF 55-60%. No RWMA, Gr 2 DD. Mod-Severe AS (mean-peak Gradient 31 mmHG - 64 mmHg), Mod MR. Mod LA dilation, Mod PA HTN - peak pressure ~54 mmHg). = Progression of AS from 2014  . ULTRASOUND GUIDANCE FOR VASCULAR ACCESS  08/09/2014   Procedure: Ultrasound Guidance For Vascular Access;  Surgeon: Serafina Mitchell, MD;  Location: [facility];  Service: Cardiovascular;;    There were no vitals filed for this visit.  Subjective Assessment - 01/01/19 1018    Subjective  Pt stated she is feeling pretty good. She rates her L knee pain as a 7/10. "knee was a little sore after last session"    Currently in Pain?  Yes    Pain Score  7     Pain Location  Knee    Pain Orientation  Left                       [facility] Adult PT Treatment/Exercise - 01/01/19 0001      Knee/Hip Exercises: Aerobic   Nustep  L1 x 6 minutes      Knee/Hip Exercises: Machines for Strengthening   Total Gym Leg Press  20# L8 10 reps x2   weight taken off for ROM, PTA assisted      Knee/Hip Exercises: Seated   Long Arc Quad  Left;2 sets;10 reps    Hamstring Curl  Left;2 sets;10 reps    Hamstring Limitations  yellow tband      Modalities   Modalities  Vasopneumatic      Vasopneumatic   Number Minutes Vasopneumatic   15 minutes    Vasopnuematic Location   Knee    Vasopneumatic Pressure  Medium      Manual Therapy   Manual Therapy  Passive ROM;Joint mobilization;Soft tissue mobilization    Manual therapy comments  Some PROM taken to end range and held     Joint Mobilization  Anterior/posterior grades 1     Passive ROM  L knee flex  and Ext                PT Short Term Goals - 12/28/18 1603      PT SHORT TERM GOAL #1   Title  independent with intial HEP    Time  2    Period  Weeks    Status  New        PT Long Term Goals - 12/28/18 1603      PT LONG TERM GOAL #1   Title  decrease pain 50%    Time  8    Period  Weeks    Status  New      PT LONG TERM GOAL #2   Title  walk with  SPC x 400 feet    Time  8    Period  Weeks    Status  New      PT LONG TERM GOAL #3   Title  increase AROM of the left knee to 10 - 110 degrees flexion    Time  12    Period  Weeks    Status  New      PT LONG TERM GOAL #4   Title  be able to cook a meal without having to sit and rest    Time  8    Period  Weeks      PT LONG TERM GOAL #5   Title  go up and down stairs step over step    Time  12    Period  Weeks    Status  New            Plan - 01/01/19 1102    Clinical Impression Statement  Pt tolerates treatment well as indicated by no increase in pain during interventions. Pt progressing toward goals. Pt able to lift 20# on leg press. Pt continues to work on increasing ROM through exercises and PROM. Pt was educated on the use of massage to break up scar tissue    Comorbidities  TAVR, COPD, GERD, ORIF right LE    Examination-Activity Limitations  Transfers;Locomotion Level;Stand;Stairs;Squat;Sleep    Examination-Participation Restrictions  Cleaning;Shop;Laundry       Patient will benefit from skilled therapeutic intervention in order to improve the following deficits and impairments:  Abnormal gait, Decreased range of motion, Difficulty walking, Decreased activity tolerance, Pain, Impaired flexibility, Decreased balance, Decreased mobility, Decreased strength, Increased edema  Visit Diagnosis: Decreased range of motion (ROM) of left knee     Problem List Patient Active Problem List   Diagnosis Date Noted  . Primary osteoarthritis of left knee 12/01/2018  . Preop cardiovascular exam 05/13/2018   . Chronic diastolic (congestive) heart failure ([place]) 12/30/2017  . GERD (gastroesophageal reflux disease)   . Hyperlipidemia   . S/P TAVR (transcatheter aortic valve replacement)   . Severe aortic stenosis   . Pulmonary nodules   . PAT (paroxysmal atrial tachycardia) ([place]) 02/07/2017  . Chronic fatigue 02/07/2017  . Pulmonary hypertension: Combined secondary as well as lung disease 12/28/2016  . Coronary artery disease, non-occlusive 11/28/2016  . Smoker 06/25/2016  . COPD (chronic obstructive pulmonary disease) ([place]) 05/22/2016  . Osteopenia 08/15/2015  . Chronic pain syndrome 07/16/2015  . Vitamin D deficiency 05/16/2015  . Essential hypertension 07/29/2014   Student was directly supervised throughout session. PTA administered STW to knee as well as CFM and educated pt to do at home [facility] [place] PTA 01/01/2019, 11:[address] [facility] [place], [place], [address] Phone: [phone]   Fax:  [phone]  Name: LAMYIAH OMOTO MRN: [id number] Date of Birth: 01-07-1951

## 2019-01-04 ENCOUNTER — Ambulatory Visit: Payer: Medicare Other | Admitting: Physical Therapy

## 2019-01-04 ENCOUNTER — Encounter: Payer: Self-pay | Admitting: Physical Therapy

## 2019-01-04 ENCOUNTER — Other Ambulatory Visit: Payer: Self-pay

## 2019-01-04 DIAGNOSIS — M25562 Pain in left knee: Secondary | ICD-10-CM | POA: Diagnosis not present

## 2019-01-04 DIAGNOSIS — M25662 Stiffness of left knee, not elsewhere classified: Secondary | ICD-10-CM

## 2019-01-04 NOTE — Therapy (Addendum)
[place], [place], [address] Phone: [phone]   Fax:  [phone]  Physical Therapy Treatment  Patient Details  Name: Alexandra [name] MRN: [id number] Date of Birth: 01/04/1951 Referring Provider (PT): Dalldorf   Encounter Date: 01/04/2019  PT End of Session - 01/04/19 1605    Visit Number  4    Date for PT Re-Evaluation  02/27/19    PT Start Time  [id number]    PT Stop Time  1556    PT Time Calculation (min)  43 min       Past Medical History:  Diagnosis Date  . Arthritis   . COPD (chronic obstructive pulmonary disease) ([place]) 05/22/2016  . Coronary artery disease, non-occlusive 11/28/2016   R&LHC: 65% distal LAD lesion -- likely not angiographically significant->medical management. Normal LVEF. Moderately elevated LVEDP. I aortic valve gradient in the Cath Lab, moderate aortic stenosis. This would suggest that the stenosis is on the moderate side of moderate to severe.  Severe pulmonary hypertension (likely mixed).  AVA 0.96 cm; P-P gradient ~ 30 mmHg, Mean ~24.5-27.5 (Moderate AS)   . GERD (gastroesophageal reflux disease)   . Heart murmur   . Hx of adenomatous colonic polyps 10/19/2015  . Hyperlipidemia   . Hypertension   . Moderate mitral regurgitation by prior echocardiography 10/2016   Echocardiogram revealed moderate mitral regurgitation  . Pulmonary hypertension associated Aortic Stenosis, Mitral Regurgitation & COPD    Peak PAP by Echo ~54 mmHg;  by Cath PAP/Mean 71/32 mmHg, 53 mmHg (with PCWP & LVEDP ~24-26 mmHg); TPG ~26 mmHg  . Pulmonary nodules   . S/P TAVR (transcatheter aortic valve replacement)   . Severe aortic stenosis     Past Surgical History:  Procedure Laterality Date  . APPENDECTOMY    . BREAST CYST EXCISION Right 1970  . CARDIAC VALVE REPLACEMENT    . CATARACT EXTRACTION W/ INTRAOCULAR LENS IMPLANT Right   . CHOLECYSTECTOMY OPEN    . COLONOSCOPY  2003, 2017  . CTA  Chest  09/2016   Thoracic Aortic Ca2+ w/o dilation.  Coronary Calcification noted. PA normal. Mild Emphysematous changes w/ mild LLL scarring.    [name] FRACTURE SURGERY     2007 -right tib /fib fracture ----07/2009 right femur fx after a fall  . FRACTURE SURGERY Right 2011   Femur  . PERIPHERAL VASCULAR CATHETERIZATION N/A 08/09/2014   Procedure: Aortic Arch Angiography;  Surgeon: Serafina Mitchell, MD;  Location: [facility];  Service: Cardiovascular: Type 1 Arch. No significant stenosis, aneurysmal degeneration or dissection.  No luminal irregularity seen in R or L SubClavian, Brachial or Radial A. Chronic distal ulnar A occlusion Bilatera.  [name] PERIPHERAL VASCULAR CATHETERIZATION Right 08/09/2014   Procedure: Upper Extremity Angiography;  Surgeon: Serafina Mitchell, MD;  Location: [facility];  Service: Cardiovascular;  Laterality: Right;  . RIGHT/LEFT HEART CATH AND CORONARY ANGIOGRAPHY N/A 12/06/2016   Procedure: RIGHT/LEFT HEART CATH AND CORONARY ANGIOGRAPHY;  Surgeon: Leonie Man, MD;  [facility]: Cor Angio: 65% dLAD (Med Rx). AVA 0.96 cm; P-P gradient ~ 30 mmHg, Mean ~24.5-27.5 (Mod AS); RHC #s: RAP 8 mmHg, RVP/EDP: 71/8/15 mmHg, PCWP: 21-24 mmHg, PAP/mean: 71/32/52 mmHg = Severe Mixed Pulmonary HTN; LVP/EDP 205/17/26 mmHg ; CO/CI by Fick: 4.35, 2.44 - mildly reduced  . RIGHT/LEFT HEART CATH AND CORONARY ANGIOGRAPHY N/A 11/20/2017   Procedure: RIGHT/LEFT HEART CATH AND CORONARY ANGIOGRAPHY;  Surgeon: Larey Dresser,  MD;  Location: [facility];  Service: Cardiovascular;  Laterality: N/A;  . TEE WITHOUT CARDIOVERSION N/A 12/30/2017   Procedure: TRANSESOPHAGEAL ECHOCARDIOGRAM (TEE);  Surgeon: Burnell Blanks, MD;  Location: [facility];  Service: Open Heart Surgery;  Laterality: N/A;  . TOTAL KNEE ARTHROPLASTY Left 12/01/2018   Procedure: Left Knee Arthroplasty;  Surgeon: Melrose Nakayama, MD;  Location: [facility];  Service: Orthopedics;  Laterality: Left;  .  TRANSCATHETER AORTIC VALVE REPLACEMENT, TRANSFEMORAL  12/30/2017  . TRANSCATHETER AORTIC VALVE REPLACEMENT, TRANSFEMORAL N/A 12/30/2017   Procedure: TRANSCATHETER AORTIC VALVE REPLACEMENT, TRANSFEMORAL;  Surgeon: Burnell Blanks, MD;  Location: [facility];  Service: Open Heart Surgery;  Laterality: N/A;  . TRANSTHORACIC ECHOCARDIOGRAM  10/30/2016   Mild Concentric LVH. EF 55-60%. No RWMA, Gr 2 DD. Mod-Severe AS (mean-peak Gradient 31 mmHG - 64 mmHg), Mod MR. Mod LA dilation, Mod PA HTN - peak pressure ~54 mmHg). = Progression of AS from 2014  . ULTRASOUND GUIDANCE FOR VASCULAR ACCESS  08/09/2014   Procedure: Ultrasound Guidance For Vascular Access;  Surgeon: Serafina Mitchell, MD;  Location: [facility];  Service: Cardiovascular;;    There were no vitals filed for this visit.  Subjective Assessment - 01/04/19 1513    Subjective  "knee is a little sore" She rates her pain as a 6/10 on the left knee    Pertinent History  TAVR 12/2018, COPD, GERD, past ORIF of the femur, and tib/fib    Pain Score  6     Pain Location  Knee    Pain Orientation  Left                       [facility] Adult PT Treatment/Exercise - 01/04/19 0001      Ambulation/Gait   Ambulation/Gait  Yes    Ambulation/Gait Assistance  6: Modified independent (Device/Increase time)    Ambulation Distance (Feet)  90 Feet    Assistive device  Straight cane    Gait Pattern  Step-through pattern    Ambulation Surface  Indoor;Level      Knee/Hip Exercises: Aerobic   Nustep  L1 x 6 minutes      Knee/Hip Exercises: Machines for Strengthening   Cybex Knee Extension  5# 2x 10    Cybex Knee Flexion  20# 2x10    Total Gym Leg Press  20# L8 10 reps x2    Hip Cybex         Knee/Hip Exercises: Seated   Long Arc Quad  Left;2 sets;10 reps    Hamstring Curl  Left;2 sets;10 reps    Hamstring Limitations  yellow tband      Manual Therapy   Manual Therapy  Passive ROM    Manual therapy comments  Some PROM taken to  end range and held     Passive ROM  L knee flex and Ext                PT Short Term Goals - 12/28/18 1603      PT SHORT TERM GOAL #1   Title  independent with intial HEP    Time  2    Period  Weeks    Status  New        PT Long Term Goals - 12/28/18 1603      PT LONG TERM GOAL #1   Title  decrease pain 50%    Time  8    Period  Weeks  Status  New      PT LONG TERM GOAL #2   Title  walk with SPC x 400 feet    Time  8    Period  Weeks    Status  New      PT LONG TERM GOAL #3   Title  increase AROM of the left knee to 10 - 110 degrees flexion    Time  12    Period  Weeks    Status  New      PT LONG TERM GOAL #4   Title  be able to cook a meal without having to sit and rest    Time  8    Period  Weeks      PT LONG TERM GOAL #5   Title  go up and down stairs step over step    Time  12    Period  Weeks    Status  New            Plan - 01/04/19 1601    Clinical Impression Statement  Pt able to ambulate with straight cane and without an assistive device. Pt requires verbal and tactile cues for LAQ. Pt continues to work towards  Increasing her  ROM with exercises and PROM. Pt progressed to strengthen exercises with 20# knee flexion and 5# knee extension.    Personal Factors and Comorbidities  Fitness;Comorbidity 3+    Examination-Activity Limitations  Transfers;Locomotion Level;Stand;Stairs;Squat;Sleep    Examination-Participation Restrictions  Cleaning;Shop;Laundry    Rehab Potential  Good    PT Frequency  3x / week    PT Duration  8 weeks    PT Treatment/Interventions  ADLs/Self Care Home Management;Cryotherapy;Electrical Stimulation;Moist Heat;Ultrasound;Gait training;Stair training;Functional mobility training;Therapeutic activities;Therapeutic exercise;Balance training;Neuromuscular re-education;Patient/family education;Manual techniques;Vasopneumatic Device;Scar mobilization    PT Next Visit Plan  L knee ROM, strength and function        Patient will benefit from skilled therapeutic intervention in order to improve the following deficits and impairments:  Abnormal gait, Decreased range of motion, Difficulty walking, Decreased activity tolerance, Pain, Impaired flexibility, Decreased balance, Decreased mobility, Decreased strength, Increased edema  Visit Diagnosis: Decreased range of motion (ROM) of left knee  Stiffness of left knee, not elsewhere classified     Problem List Patient Active Problem List   Diagnosis Date Noted  . Primary osteoarthritis of left knee 12/01/2018  . Preop cardiovascular exam 05/13/2018  . Chronic diastolic (congestive) heart failure ([place]) 12/30/2017  . GERD (gastroesophageal reflux disease)   . Hyperlipidemia   . S/P TAVR (transcatheter aortic valve replacement)   . Severe aortic stenosis   . Pulmonary nodules   . PAT (paroxysmal atrial tachycardia) ([place]) 02/07/2017  . Chronic fatigue 02/07/2017  . Pulmonary hypertension: Combined secondary as well as lung disease 12/28/2016  . Coronary artery disease, non-occlusive 11/28/2016  . Smoker 06/25/2016  . COPD (chronic obstructive pulmonary disease) ([place]) 05/22/2016  . Osteopenia 08/15/2015  . Chronic pain syndrome 07/16/2015  . Vitamin D deficiency 05/16/2015  . Essential hypertension 07/29/2014    Barrett Henle, [place] 01/04/2019, 4:09 PM  [place], [place], [address] Phone: [phone]   Fax:  [phone]  Name: TU FERENCE MRN: [id number] Date of Birth: [birth year]

## 2019-01-06 ENCOUNTER — Encounter: Payer: Self-pay | Admitting: Physical Therapy

## 2019-01-06 ENCOUNTER — Other Ambulatory Visit: Payer: Self-pay

## 2019-01-06 ENCOUNTER — Ambulatory Visit: Payer: Medicare Other | Admitting: Physical Therapy

## 2019-01-06 DIAGNOSIS — M25562 Pain in left knee: Secondary | ICD-10-CM

## 2019-01-06 DIAGNOSIS — R262 Difficulty in walking, not elsewhere classified: Secondary | ICD-10-CM

## 2019-01-06 DIAGNOSIS — R600 Localized edema: Secondary | ICD-10-CM

## 2019-01-06 DIAGNOSIS — M25662 Stiffness of left knee, not elsewhere classified: Secondary | ICD-10-CM

## 2019-01-06 NOTE — Therapy (Signed)
[place], [place], [address] Phone: [phone]   Fax:  [phone]  Physical Therapy Treatment  Patient Details  Name: Alexandra [name] MRN: [id number] Date of Birth: 09-Nov-1950 Referring Provider (PT): Dalldorf   Encounter Date: 01/06/2019  PT End of Session - 01/06/19 1138    Visit Number  5    Date for PT Re-Evaluation  02/27/19    PT Start Time  1055    PT Stop Time  1143    PT Time Calculation (min)  48 min    Behavior During Therapy  [facility] for tasks assessed/performed;Anxious       Past Medical History:  Diagnosis Date  . Arthritis   . COPD (chronic obstructive pulmonary disease) ([place]) 05/22/2016  . Coronary artery disease, non-occlusive 11/28/2016   R&LHC: 65% distal LAD lesion -- likely not angiographically significant->medical management. Normal LVEF. Moderately elevated LVEDP. I aortic valve gradient in the Cath Lab, moderate aortic stenosis. This would suggest that the stenosis is on the moderate side of moderate to severe.  Severe pulmonary hypertension (likely mixed).  AVA 0.96 cm; P-P gradient ~ 30 mmHg, Mean ~24.5-27.5 (Moderate AS)   . GERD (gastroesophageal reflux disease)   . Heart murmur   . Hx of adenomatous colonic polyps 10/19/2015  . Hyperlipidemia   . Hypertension   . Moderate mitral regurgitation by prior echocardiography 10/2016   Echocardiogram revealed moderate mitral regurgitation  . Pulmonary hypertension associated Aortic Stenosis, Mitral Regurgitation & COPD    Peak PAP by Echo ~54 mmHg;  by Cath PAP/Mean 71/32 mmHg, 53 mmHg (with PCWP & LVEDP ~24-26 mmHg); TPG ~26 mmHg  . Pulmonary nodules   . S/P TAVR (transcatheter aortic valve replacement)   . Severe aortic stenosis     Past Surgical History:  Procedure Laterality Date  . APPENDECTOMY    . BREAST CYST EXCISION Right 1970  . CARDIAC VALVE REPLACEMENT    . CATARACT EXTRACTION W/ INTRAOCULAR LENS IMPLANT  Right   . CHOLECYSTECTOMY OPEN    . COLONOSCOPY  2003, 2017  . CTA Chest  09/2016   Thoracic Aortic Ca2+ w/o dilation.  Coronary Calcification noted. PA normal. Mild Emphysematous changes w/ mild LLL scarring.    [name] FRACTURE SURGERY     2007 -right tib /fib fracture ----07/2009 right femur fx after a fall  . FRACTURE SURGERY Right 2011   Femur  . PERIPHERAL VASCULAR CATHETERIZATION N/A 08/09/2014   Procedure: Aortic Arch Angiography;  Surgeon: Serafina Mitchell, MD;  Location: [facility];  Service: Cardiovascular: Type 1 Arch. No significant stenosis, aneurysmal degeneration or dissection.  No luminal irregularity seen in R or L SubClavian, Brachial or Radial A. Chronic distal ulnar A occlusion Bilatera.  [name] PERIPHERAL VASCULAR CATHETERIZATION Right 08/09/2014   Procedure: Upper Extremity Angiography;  Surgeon: Serafina Mitchell, MD;  Location: [facility];  Service: Cardiovascular;  Laterality: Right;  . RIGHT/LEFT HEART CATH AND CORONARY ANGIOGRAPHY N/A 12/06/2016   Procedure: RIGHT/LEFT HEART CATH AND CORONARY ANGIOGRAPHY;  Surgeon: Leonie Man, MD;  [facility]: Cor Angio: 65% dLAD (Med Rx). AVA 0.96 cm; P-P gradient ~ 30 mmHg, Mean ~24.5-27.5 (Mod AS); RHC #s: RAP 8 mmHg, RVP/EDP: 71/8/15 mmHg, PCWP: 21-24 mmHg, PAP/mean: 71/32/52 mmHg = Severe Mixed Pulmonary HTN; LVP/EDP 205/17/26 mmHg ; CO/CI by Fick: 4.35, 2.44 - mildly reduced  . RIGHT/LEFT HEART CATH AND CORONARY ANGIOGRAPHY N/A 11/20/2017   Procedure:  RIGHT/LEFT HEART CATH AND CORONARY ANGIOGRAPHY;  Surgeon: Larey Dresser, MD;  Location: [facility];  Service: Cardiovascular;  Laterality: N/A;  . TEE WITHOUT CARDIOVERSION N/A 12/30/2017   Procedure: TRANSESOPHAGEAL ECHOCARDIOGRAM (TEE);  Surgeon: Burnell Blanks, MD;  Location: [facility];  Service: Open Heart Surgery;  Laterality: N/A;  . TOTAL KNEE ARTHROPLASTY Left 12/01/2018   Procedure: Left Knee Arthroplasty;  Surgeon: Melrose Nakayama, MD;   Location[facility];  Service: Orthopedics;  Laterality: Left;  . TRANSCATHETER AORTIC VALVE REPLACEMENT, TRANSFEMORAL  12/30/2017  . TRANSCATHETER AORTIC VALVE REPLACEMENT, TRANSFEMORAL N/A 12/30/2017   Procedure: TRANSCATHETER AORTIC VALVE REPLACEMENT, TRANSFEMORAL;  Surgeon: Burnell Blanks, MD;  Location: [facility];  Service: Open Heart Surgery;  Laterality: N/A;  . TRANSTHORACIC ECHOCARDIOGRAM  10/30/2016   Mild Concentric LVH. EF 55-60%. No RWMA, Gr 2 DD. Mod-Severe AS (mean-peak Gradient 31 mmHG - 64 mmHg), Mod MR. Mod LA dilation, Mod PA HTN - peak pressure ~54 mmHg). = Progression of AS from 2014  . ULTRASOUND GUIDANCE FOR VASCULAR ACCESS  08/09/2014   Procedure: Ultrasound Guidance For Vascular Access;  Surgeon: Serafina Mitchell, MD;  Location: [facility];  Service: Cardiovascular;;    There were no vitals filed for this visit.  Subjective Assessment - 01/06/19 1101    Subjective  Patient reports that she is trying to walk some without the walker, she is still sore and tentative to bending the knee    Currently in Pain?  Yes    Pain Score  6     Pain Location  Knee    Pain Orientation  Left    Aggravating Factors   bending and walking         [facility] PT Assessment - 01/06/19 0001      AROM   Left Knee Extension  10    Left Knee Flexion  104      PROM   Left Knee Extension  5    Left Knee Flexion  111                   [facility] Adult PT Treatment/Exercise - 01/06/19 0001      Ambulation/Gait   Gait Comments  gait with a SPC in the hall cues to bend knee and for sequencing, did some walking outside on pavement, tried some walking in the hall without a device with HHA and trying to increase her speed and normalcy of the gait pattern, she is very hesitant      Knee/Hip Exercises: Aerobic   Nustep  L4 x 6 minutes      Knee/Hip Exercises: Machines for Strengthening   Cybex Knee Extension  5# 2x 10    Cybex Knee Flexion  20# 2x10    Total Gym Leg Press  no  weight worked on coming low, and with toes even with top of foot plate she could go to the bottom      Manual Therapy   Manual Therapy  Passive ROM    Passive ROM  L knee flex and Ext                PT Short Term Goals - 12/28/18 1603      PT SHORT TERM GOAL #1   Title  independent with intial HEP    Time  2    Period  Weeks    Status  New        PT Long Term Goals - 01/06/19 1152  PT LONG TERM GOAL #1   Title  decrease pain 50%    Status  On-going      PT LONG TERM GOAL #2   Title  walk with SPC x 400 feet    Status  Partially Met      PT LONG TERM GOAL #3   Title  increase AROM of the left knee to 10 - 110 degrees flexion    Status  Partially Met            Plan - 01/06/19 1138    Clinical Impression Statement  Patient has made really great gains in her ROM and function, she is still very hesitant with ROMa nd gait.  With the SPc is was a little difficult to get her sequencing correct and again she is very hesitant and fearful of walking.    PT Next Visit Plan  may need MD note,       Patient will benefit from skilled therapeutic intervention in order to improve the following deficits and impairments:  Abnormal gait, Decreased range of motion, Difficulty walking, Decreased activity tolerance, Pain, Impaired flexibility, Decreased balance, Decreased mobility, Decreased strength, Increased edema  Visit Diagnosis: Decreased range of motion (ROM) of left knee  Stiffness of left knee, not elsewhere classified  Acute pain of left knee  Difficulty in walking, not elsewhere classified  Localized edema     Problem List Patient Active Problem List   Diagnosis Date Noted  . Primary osteoarthritis of left knee 12/01/2018  . Preop cardiovascular exam 05/13/2018  . Chronic diastolic (congestive) heart failure ([place]) 12/30/2017  . GERD (gastroesophageal reflux disease)   . Hyperlipidemia   . S/P TAVR (transcatheter aortic valve replacement)   .  Severe aortic stenosis   . Pulmonary nodules   . PAT (paroxysmal atrial tachycardia) ([place]) 02/07/2017  . Chronic fatigue 02/07/2017  . Pulmonary hypertension: Combined secondary as well as lung disease 12/28/2016  . Coronary artery disease, non-occlusive 11/28/2016  . Smoker 06/25/2016  . COPD (chronic obstructive pulmonary disease) ([place]) 05/22/2016  . Osteopenia 08/15/2015  . Chronic pain syndrome 07/16/2015  . Vitamin D deficiency 05/16/2015  . Essential hypertension 07/29/2014    Sumner Boast., PT 01/06/2019, 11:55 AM  [address] [place], [place], [address] Phone: [phone]   Fax:  [phone]  Name: Alexandra [name] MRN: [id number] Date of Birth: 11-10-1950

## 2019-01-07 ENCOUNTER — Ambulatory Visit: Payer: Medicare Other | Admitting: Physical Therapy

## 2019-01-07 DIAGNOSIS — M25562 Pain in left knee: Secondary | ICD-10-CM | POA: Diagnosis not present

## 2019-01-07 DIAGNOSIS — M25662 Stiffness of left knee, not elsewhere classified: Secondary | ICD-10-CM

## 2019-01-07 NOTE — Therapy (Signed)
[place], [place], [address] Phone: [phone]   Fax:  [phone]  Physical Therapy Treatment  Patient Details  Name: Alexandra [name] MRN: [id number] Date of Birth: 01/20/1951 Referring Provider (PT): Dalldorf   Encounter Date: 01/07/2019  PT End of Session - 01/07/19 1017    Visit Number  6    PT Start Time  6962    PT Stop Time  1010    PT Time Calculation (min)  45 min       Past Medical History:  Diagnosis Date  . Arthritis   . COPD (chronic obstructive pulmonary disease) ([place]) 05/22/2016  . Coronary artery disease, non-occlusive 11/28/2016   R&LHC: 65% distal LAD lesion -- likely not angiographically significant->medical management. Normal LVEF. Moderately elevated LVEDP. I aortic valve gradient in the Cath Lab, moderate aortic stenosis. This would suggest that the stenosis is on the moderate side of moderate to severe.  Severe pulmonary hypertension (likely mixed).  AVA 0.96 cm; P-P gradient ~ 30 mmHg, Mean ~24.5-27.5 (Moderate AS)   . GERD (gastroesophageal reflux disease)   . Heart murmur   . Hx of adenomatous colonic polyps 10/19/2015  . Hyperlipidemia   . Hypertension   . Moderate mitral regurgitation by prior echocardiography 10/2016   Echocardiogram revealed moderate mitral regurgitation  . Pulmonary hypertension associated Aortic Stenosis, Mitral Regurgitation & COPD    Peak PAP by Echo ~54 mmHg;  by Cath PAP/Mean 71/32 mmHg, 53 mmHg (with PCWP & LVEDP ~24-26 mmHg); TPG ~26 mmHg  . Pulmonary nodules   . S/P TAVR (transcatheter aortic valve replacement)   . Severe aortic stenosis     Past Surgical History:  Procedure Laterality Date  . APPENDECTOMY    . BREAST CYST EXCISION Right 1970  . CARDIAC VALVE REPLACEMENT    . CATARACT EXTRACTION W/ INTRAOCULAR LENS IMPLANT Right   . CHOLECYSTECTOMY OPEN    . COLONOSCOPY  2003, 2017  . CTA Chest  09/2016   Thoracic Aortic Ca2+ w/o  dilation.  Coronary Calcification noted. PA normal. Mild Emphysematous changes w/ mild LLL scarring.    [name] FRACTURE SURGERY     2007 -right tib /fib fracture ----07/2009 right femur fx after a fall  . FRACTURE SURGERY Right 2011   Femur  . PERIPHERAL VASCULAR CATHETERIZATION N/A 08/09/2014   Procedure: Aortic Arch Angiography;  Surgeon: Serafina Mitchell, MD;  Location: [facility];  Service: Cardiovascular: Type 1 Arch. No significant stenosis, aneurysmal degeneration or dissection.  No luminal irregularity seen in R or L SubClavian, Brachial or Radial A. Chronic distal ulnar A occlusion [place].  [name] PERIPHERAL VASCULAR CATHETERIZATION Right 08/09/2014   Procedure: Upper Extremity Angiography;  Surgeon: Serafina Mitchell, MD;  Location: [facility];  Service: Cardiovascular;  Laterality: Right;  . RIGHT/LEFT HEART CATH AND CORONARY ANGIOGRAPHY N/A 12/06/2016   Procedure: RIGHT/LEFT HEART CATH AND CORONARY ANGIOGRAPHY;  Surgeon: Leonie Man, MD;  [facility]: Cor Angio: 65% dLAD (Med Rx). AVA 0.96 cm; P-P gradient ~ 30 mmHg, Mean ~24.5-27.5 (Mod AS); RHC #s: RAP 8 mmHg, RVP/EDP: 71/8/15 mmHg, PCWP: 21-24 mmHg, PAP/mean: 71/32/52 mmHg = Severe Mixed Pulmonary HTN; LVP/EDP 205/17/26 mmHg ; CO/CI by Fick: 4.35, 2.44 - mildly reduced  . RIGHT/LEFT HEART CATH AND CORONARY ANGIOGRAPHY N/A 11/20/2017   Procedure: RIGHT/LEFT HEART CATH AND CORONARY ANGIOGRAPHY;  Surgeon: Larey Dresser, MD;  Location: [facility];  Service:  Cardiovascular;  Laterality: N/A;  . TEE WITHOUT CARDIOVERSION N/A 12/30/2017   Procedure: TRANSESOPHAGEAL ECHOCARDIOGRAM (TEE);  Surgeon: Burnell Blanks, MD;  Location: [facility];  Service: Open Heart Surgery;  Laterality: N/A;  . TOTAL KNEE ARTHROPLASTY Left 12/01/2018   Procedure: Left Knee Arthroplasty;  Surgeon: Melrose Nakayama, MD;  Location: [facility];  Service: Orthopedics;  Laterality: Left;  . TRANSCATHETER AORTIC VALVE REPLACEMENT,  TRANSFEMORAL  12/30/2017  . TRANSCATHETER AORTIC VALVE REPLACEMENT, TRANSFEMORAL N/A 12/30/2017   Procedure: TRANSCATHETER AORTIC VALVE REPLACEMENT, TRANSFEMORAL;  Surgeon: Burnell Blanks, MD;  Location: [facility];  Service: Open Heart Surgery;  Laterality: N/A;  . TRANSTHORACIC ECHOCARDIOGRAM  10/30/2016   Mild Concentric LVH. EF 55-60%. No RWMA, Gr 2 DD. Mod-Severe AS (mean-peak Gradient 31 mmHG - 64 mmHg), Mod MR. Mod LA dilation, Mod PA HTN - peak pressure ~54 mmHg). = Progression of AS from 2014  . ULTRASOUND GUIDANCE FOR VASCULAR ACCESS  08/09/2014   Procedure: Ultrasound Guidance For Vascular Access;  Surgeon: Serafina Mitchell, MD;  Location: [facility];  Service: Cardiovascular;;    There were no vitals filed for this visit.  Subjective Assessment - 01/07/19 0928    Subjective  "knee is a little sore today" 'didn't sleep well last night"    Currently in Pain?  Yes    Pain Score  7     Pain Location  Knee    Pain Orientation  Left                       [facility] Adult PT Treatment/Exercise - 01/07/19 0001      Ambulation/Gait   Ambulation/Gait  Yes    Ambulation/Gait Assistance  6: Modified independent (Device/Increase time)    Ambulation Distance (Feet)  200 Feet    Assistive device  Straight cane    Gait Pattern  Step-to pattern    Ambulation Surface  Indoor;Level    Gait Comments  gait with a SPC in the hall cues for sequencing, and AD negoiation. she required standin rest breaks       Knee/Hip Exercises: Aerobic   Nustep  L4 x 6 minutes      Knee/Hip Exercises: Seated   Long Arc Quad  Left;2 sets;10 reps    Long Arc Quad Weight  3 lbs.    Other Seated Knee/Hip Exercises  Quad sets LLE x10     Hamstring Curl  Left;2 sets;10 reps    Hamstring Limitations  red band      Manual Therapy   Manual Therapy  Passive ROM    Manual therapy comments  Some PROM taken to end range and held     Passive ROM  L knee flex and Ext                PT  Short Term Goals - 12/28/18 1603      PT SHORT TERM GOAL #1   Title  independent with intial HEP    Time  2    Period  Weeks    Status  New        PT Long Term Goals - 01/06/19 1152      PT LONG TERM GOAL #1   Title  decrease pain 50%    Status  On-going      PT LONG TERM GOAL #2   Title  walk with SPC x 400 feet    Status  Partially Met      PT  LONG TERM GOAL #3   Title  increase AROM of the left knee to 10 - 110 degrees flexion    Status  Partially Met            Plan - 01/07/19 1019    Clinical Impression Statement  Pt able to walk with SPC. she is making improvements with her sequencing. Pt requires standing rest breaks with gait. She continues to make gains with her ROM. pt needs cues with forward step ups for sequencing.    Personal Factors and Comorbidities  Fitness;Comorbidity 3+    Rehab Potential  Good    PT Frequency  3x / week    PT Treatment/Interventions  ADLs/Self Care Home Management;Cryotherapy;Electrical Stimulation;Moist Heat;Ultrasound;Gait training;Stair training;Functional mobility training;Therapeutic activities;Therapeutic exercise;Balance training;Neuromuscular re-education;Patient/family education;Manual techniques;Vasopneumatic Device;Scar mobilization       Patient will benefit from skilled therapeutic intervention in order to improve the following deficits and impairments:  Abnormal gait, Decreased range of motion, Difficulty walking, Decreased activity tolerance, Pain, Impaired flexibility, Decreased balance, Decreased mobility, Decreased strength, Increased edema  Visit Diagnosis: Decreased range of motion (ROM) of left knee  Acute pain of left knee     Problem List Patient Active Problem List   Diagnosis Date Noted  . Primary osteoarthritis of left knee 12/01/2018  . Preop cardiovascular exam 05/13/2018  . Chronic diastolic (congestive) heart failure ([place]) 12/30/2017  . GERD (gastroesophageal reflux disease)   . Hyperlipidemia    . S/P TAVR (transcatheter aortic valve replacement)   . Severe aortic stenosis   . Pulmonary nodules   . PAT (paroxysmal atrial tachycardia) ([place]) 02/07/2017  . Chronic fatigue 02/07/2017  . Pulmonary hypertension: Combined secondary as well as lung disease 12/28/2016  . Coronary artery disease, non-occlusive 11/28/2016  . Smoker 06/25/2016  . COPD (chronic obstructive pulmonary disease) ([place]) 05/22/2016  . Osteopenia 08/15/2015  . Chronic pain syndrome 07/16/2015  . Vitamin D deficiency 05/16/2015  . Essential hypertension 07/29/2014    Barrett Henle, [place] 01/07/2019, 10:23 AM  [place], [place], [address] Phone: [phone]   Fax:  [phone]  Name: Alexandra [name] MRN: [id number] Date of Birth: 1950-03-27

## 2019-01-08 ENCOUNTER — Encounter: Payer: Medicare Other | Admitting: Physical Therapy

## 2019-01-11 ENCOUNTER — Ambulatory Visit: Payer: Medicare Other | Admitting: Physical Therapy

## 2019-01-11 ENCOUNTER — Other Ambulatory Visit: Payer: Self-pay

## 2019-01-11 DIAGNOSIS — M25662 Stiffness of left knee, not elsewhere classified: Secondary | ICD-10-CM

## 2019-01-11 DIAGNOSIS — M25562 Pain in left knee: Secondary | ICD-10-CM | POA: Diagnosis not present

## 2019-01-11 NOTE — Therapy (Signed)
[place], [place], [address] Phone: [phone]   Fax:  [phone]  Physical Therapy Treatment  Patient Details  Name: Alexandra [name] MRN: [id number] Date of Birth: [birth year] Referring Provider (PT): Dalldorf   Encounter Date: 01/11/2019  PT End of Session - 01/11/19 1054    Visit Number  7    PT Start Time  8937    PT Stop Time  1055    PT Time Calculation (min)  40 min       Past Medical History:  Diagnosis Date  . Arthritis   . COPD (chronic obstructive pulmonary disease) ([place]) 05/22/2016  . Coronary artery disease, non-occlusive 11/28/2016   R&LHC: 65% distal LAD lesion -- likely not angiographically significant->medical management. Normal LVEF. Moderately elevated LVEDP. I aortic valve gradient in the Cath Lab, moderate aortic stenosis. This would suggest that the stenosis is on the moderate side of moderate to severe.  Severe pulmonary hypertension (likely mixed).  AVA 0.96 cm; P-P gradient ~ 30 mmHg, Mean ~24.5-27.5 (Moderate AS)   . GERD (gastroesophageal reflux disease)   . Heart murmur   . Hx of adenomatous colonic polyps 10/19/2015  . Hyperlipidemia   . Hypertension   . Moderate mitral regurgitation by prior echocardiography 10/2016   Echocardiogram revealed moderate mitral regurgitation  . Pulmonary hypertension associated Aortic Stenosis, Mitral Regurgitation & COPD    Peak PAP by Echo ~54 mmHg;  by Cath PAP/Mean 71/32 mmHg, 53 mmHg (with PCWP & LVEDP ~24-26 mmHg); TPG ~26 mmHg  . Pulmonary nodules   . S/P TAVR (transcatheter aortic valve replacement)   . Severe aortic stenosis     Past Surgical History:  Procedure Laterality Date  . APPENDECTOMY    . BREAST CYST EXCISION Right 1970  . CARDIAC VALVE REPLACEMENT    . CATARACT EXTRACTION W/ INTRAOCULAR LENS IMPLANT Right   . CHOLECYSTECTOMY OPEN    . COLONOSCOPY  2003, 2017  . CTA Chest  09/2016   Thoracic Aortic Ca2+ w/o  dilation.  Coronary Calcification noted. PA normal. Mild Emphysematous changes w/ mild LLL scarring.    [name] FRACTURE SURGERY     2007 -right tib /fib fracture ----07/2009 right femur fx after a fall  . FRACTURE SURGERY Right 2011   Femur  . PERIPHERAL VASCULAR CATHETERIZATION N/A 08/09/2014   Procedure: Aortic Arch Angiography;  Surgeon: Serafina Mitchell, MD;  Location: [facility];  Service: Cardiovascular: Type 1 Arch. No significant stenosis, aneurysmal degeneration or dissection.  No luminal irregularity seen in R or L SubClavian, Brachial or Radial A. Chronic distal ulnar A occlusion [place].  [name] PERIPHERAL VASCULAR CATHETERIZATION Right 08/09/2014   Procedure: Upper Extremity Angiography;  Surgeon: Serafina Mitchell, MD;  Location: [facility];  Service: Cardiovascular;  Laterality: Right;  . RIGHT/LEFT HEART CATH AND CORONARY ANGIOGRAPHY N/A 12/06/2016   Procedure: RIGHT/LEFT HEART CATH AND CORONARY ANGIOGRAPHY;  Surgeon: Leonie Man, MD;  [facility]: Cor Angio: 65% dLAD (Med Rx). AVA 0.96 cm; P-P gradient ~ 30 mmHg, Mean ~24.5-27.5 (Mod AS); RHC #s: RAP 8 mmHg, RVP/EDP: 71/8/15 mmHg, PCWP: 21-24 mmHg, PAP/mean: 71/32/52 mmHg = Severe Mixed Pulmonary HTN; LVP/EDP 205/17/26 mmHg ; CO/CI by Fick: 4.35, 2.44 - mildly reduced  . RIGHT/LEFT HEART CATH AND CORONARY ANGIOGRAPHY N/A 11/20/2017   Procedure: RIGHT/LEFT HEART CATH AND CORONARY ANGIOGRAPHY;  Surgeon: Larey Dresser, MD;  Location: [facility];  Service:  Cardiovascular;  Laterality: N/A;  . TEE WITHOUT CARDIOVERSION N/A 12/30/2017   Procedure: TRANSESOPHAGEAL ECHOCARDIOGRAM (TEE);  Surgeon: Burnell Blanks, MD;  Location: [facility];  Service: Open Heart Surgery;  Laterality: N/A;  . TOTAL KNEE ARTHROPLASTY Left 12/01/2018   Procedure: Left Knee Arthroplasty;  Surgeon: Melrose Nakayama, MD;  [facility];  Service: Orthopedics;  Laterality: Left;  . TRANSCATHETER AORTIC VALVE REPLACEMENT,  TRANSFEMORAL  12/30/2017  . TRANSCATHETER AORTIC VALVE REPLACEMENT, TRANSFEMORAL N/A 12/30/2017   Procedure: TRANSCATHETER AORTIC VALVE REPLACEMENT, TRANSFEMORAL;  Surgeon: Burnell Blanks, MD;  Location: [facility];  Service: Open Heart Surgery;  Laterality: N/A;  . TRANSTHORACIC ECHOCARDIOGRAM  10/30/2016   Mild Concentric LVH. EF 55-60%. No RWMA, Gr 2 DD. Mod-Severe AS (mean-peak Gradient 31 mmHG - 64 mmHg), Mod MR. Mod LA dilation, Mod PA HTN - peak pressure ~54 mmHg). = Progression of AS from 2014  . ULTRASOUND GUIDANCE FOR VASCULAR ACCESS  08/09/2014   Procedure: Ultrasound Guidance For Vascular Access;  Surgeon: Serafina Mitchell, MD;  Location: [facility];  Service: Cardiovascular;;    There were no vitals filed for this visit.  Subjective Assessment - 01/11/19 1018    Subjective  "do not sleep well last night, was hoping for half the time today". Pt reports pain in the left knee. "doctor's appt went well"    Patient Stated Goals  have less pain and walk better    Currently in Pain?  Yes    Pain Score  7     Pain Location  Knee    Pain Orientation  Left                       [facility] Adult PT Treatment/Exercise - 01/11/19 0001      Knee/Hip Exercises: Aerobic   Nustep  L4 x 6 minutes      Knee/Hip Exercises: Machines for Strengthening   Cybex Knee Extension  5# 2x 10, 3 sec hold at the top    Cybex Knee Flexion  20# 2x10, 10 reps RLE only 10#      Knee/Hip Exercises: Standing   Terminal Knee Extension  AROM;Left;20 reps   ball on the wall     Knee/Hip Exercises: Seated   Other Seated Knee/Hip Exercises  Quad sets LLE 2 x10       Manual Therapy   Manual Therapy  Passive ROM    Manual therapy comments  Some PROM taken to end range and held     Passive ROM  L knee flex and Ext                PT Short Term Goals - 12/28/18 1603      PT SHORT TERM GOAL #1   Title  independent with intial HEP    Time  2    Period  Weeks    Status  New         PT Long Term Goals - 01/06/19 1152      PT LONG TERM GOAL #1   Title  decrease pain 50%    Status  On-going      PT LONG TERM GOAL #2   Title  walk with SPC x 400 feet    Status  Partially Met      PT LONG TERM GOAL #3   Title  increase AROM of the left knee to 10 - 110 degrees flexion    Status  Partially Met  Plan - 01/11/19 1056    Clinical Impression Statement  Pt came in using her [place]. Pt requires rest breaks between exercises due to fatigue. Focused on increasing TKE. Exercises were kept the same due to pt fatigue.    Examination-Activity Limitations  Transfers;Locomotion Level;Stand;Stairs;Squat;Sleep    Examination-Participation Restrictions  Cleaning;Shop;Laundry    Rehab Potential  Good    PT Frequency  3x / week    PT Duration  8 weeks    PT Treatment/Interventions  ADLs/Self Care Home Management;Cryotherapy;Electrical Stimulation;Moist Heat;Ultrasound;Gait training;Stair training;Functional mobility training;Therapeutic activities;Therapeutic exercise;Balance training;Neuromuscular re-education;Patient/family education;Manual techniques;Vasopneumatic Device;Scar mobilization    PT Next Visit Plan  continue to work on ROM and progress functional exercises       Patient will benefit from skilled therapeutic intervention in order to improve the following deficits and impairments:  Abnormal gait, Decreased range of motion, Difficulty walking, Decreased activity tolerance, Pain, Impaired flexibility, Decreased balance, Decreased mobility, Decreased strength, Increased edema  Visit Diagnosis: Decreased range of motion (ROM) of left knee  Stiffness of left knee, not elsewhere classified     Problem List Patient Active Problem List   Diagnosis Date Noted  . Primary osteoarthritis of left knee 12/01/2018  . Preop cardiovascular exam 05/13/2018  . Chronic diastolic (congestive) heart failure ([place]) 12/30/2017  . GERD (gastroesophageal reflux  disease)   . Hyperlipidemia   . S/P TAVR (transcatheter aortic valve replacement)   . Severe aortic stenosis   . Pulmonary nodules   . PAT (paroxysmal atrial tachycardia) ([place]) 02/07/2017  . Chronic fatigue 02/07/2017  . Pulmonary hypertension: Combined secondary as well as lung disease 12/28/2016  . Coronary artery disease, non-occlusive 11/28/2016  . Smoker 06/25/2016  . COPD (chronic obstructive pulmonary disease) ([place]) 05/22/2016  . Osteopenia 08/15/2015  . Chronic pain syndrome 07/16/2015  . Vitamin D deficiency 05/16/2015  . Essential hypertension 07/29/2014    Barrett Henle, [place] 01/11/2019, 1:14 PM  [place], [place], [address] Phone: [phone]   Fax:  [phone]  Name: Alexandra [name] MRN: [id number] Date of Birth: [birth year]

## 2019-01-11 NOTE — Therapy (Deleted)
[place], [place], [address] Phone: [phone]   Fax:  [phone]  Physical Therapy Treatment  Patient Details  Name: Alexandra [name] MRN: [id number] Date of Birth: [birth year] Referring Provider (PT): Dalldorf   Encounter Date: 01/11/2019  PT End of Session - 01/11/19 1054    Visit Number  7    PT Start Time  8937    PT Stop Time  1055    PT Time Calculation (min)  40 min       Past Medical History:  Diagnosis Date  . Arthritis   . COPD (chronic obstructive pulmonary disease) ([place]) 05/22/2016  . Coronary artery disease, non-occlusive 11/28/2016   R&LHC: 65% distal LAD lesion -- likely not angiographically significant->medical management. Normal LVEF. Moderately elevated LVEDP. I aortic valve gradient in the Cath Lab, moderate aortic stenosis. This would suggest that the stenosis is on the moderate side of moderate to severe.  Severe pulmonary hypertension (likely mixed).  AVA 0.96 cm; P-P gradient ~ 30 mmHg, Mean ~24.5-27.5 (Moderate AS)   . GERD (gastroesophageal reflux disease)   . Heart murmur   . Hx of adenomatous colonic polyps 10/19/2015  . Hyperlipidemia   . Hypertension   . Moderate mitral regurgitation by prior echocardiography 10/2016   Echocardiogram revealed moderate mitral regurgitation  . Pulmonary hypertension associated Aortic Stenosis, Mitral Regurgitation & COPD    Peak PAP by Echo ~54 mmHg;  by Cath PAP/Mean 71/32 mmHg, 53 mmHg (with PCWP & LVEDP ~24-26 mmHg); TPG ~26 mmHg  . Pulmonary nodules   . S/P TAVR (transcatheter aortic valve replacement)   . Severe aortic stenosis     Past Surgical History:  Procedure Laterality Date  . APPENDECTOMY    . BREAST CYST EXCISION Right 1970  . CARDIAC VALVE REPLACEMENT    . CATARACT EXTRACTION W/ INTRAOCULAR LENS IMPLANT Right   . CHOLECYSTECTOMY OPEN    . COLONOSCOPY  2003, 2017  . CTA Chest  09/2016   Thoracic Aortic Ca2+ w/o  dilation.  Coronary Calcification noted. PA normal. Mild Emphysematous changes w/ mild LLL scarring.    [name] FRACTURE SURGERY     2007 -right tib /fib fracture ----07/2009 right femur fx after a fall  . FRACTURE SURGERY Right 2011   Femur  . PERIPHERAL VASCULAR CATHETERIZATION N/A 08/09/2014   Procedure: Aortic Arch Angiography;  Surgeon: Serafina Mitchell, MD;  Location: [facility];  Service: Cardiovascular: Type 1 Arch. No significant stenosis, aneurysmal degeneration or dissection.  No luminal irregularity seen in R or L SubClavian, Brachial or Radial A. Chronic distal ulnar A occlusion [place].  [name] PERIPHERAL VASCULAR CATHETERIZATION Right 08/09/2014   Procedure: Upper Extremity Angiography;  Surgeon: Serafina Mitchell, MD;  Location: [facility];  Service: Cardiovascular;  Laterality: Right;  . RIGHT/LEFT HEART CATH AND CORONARY ANGIOGRAPHY N/A 12/06/2016   Procedure: RIGHT/LEFT HEART CATH AND CORONARY ANGIOGRAPHY;  Surgeon: Leonie Man, MD;  [facility]: Cor Angio: 65% dLAD (Med Rx). AVA 0.96 cm; P-P gradient ~ 30 mmHg, Mean ~24.5-27.5 (Mod AS); RHC #s: RAP 8 mmHg, RVP/EDP: 71/8/15 mmHg, PCWP: 21-24 mmHg, PAP/mean: 71/32/52 mmHg = Severe Mixed Pulmonary HTN; LVP/EDP 205/17/26 mmHg ; CO/CI by Fick: 4.35, 2.44 - mildly reduced  . RIGHT/LEFT HEART CATH AND CORONARY ANGIOGRAPHY N/A 11/20/2017   Procedure: RIGHT/LEFT HEART CATH AND CORONARY ANGIOGRAPHY;  Surgeon: Larey Dresser, MD;  Location: [facility];  Service:  Cardiovascular;  Laterality: N/A;  . TEE WITHOUT CARDIOVERSION N/A 12/30/2017   Procedure: TRANSESOPHAGEAL ECHOCARDIOGRAM (TEE);  Surgeon: Burnell Blanks, MD;  Location: [facility];  Service: Open Heart Surgery;  Laterality: N/A;  . TOTAL KNEE ARTHROPLASTY Left 12/01/2018   Procedure: Left Knee Arthroplasty;  Surgeon: Melrose Nakayama, MD;  [facility]: [facility];  Service: Orthopedics;  Laterality: Left;  . TRANSCATHETER AORTIC VALVE REPLACEMENT,  TRANSFEMORAL  12/30/2017  . TRANSCATHETER AORTIC VALVE REPLACEMENT, TRANSFEMORAL N/A 12/30/2017   Procedure: TRANSCATHETER AORTIC VALVE REPLACEMENT, TRANSFEMORAL;  Surgeon: Burnell Blanks, MD;  Location: [facility];  Service: Open Heart Surgery;  Laterality: N/A;  . TRANSTHORACIC ECHOCARDIOGRAM  10/30/2016   Mild Concentric LVH. EF 55-60%. No RWMA, Gr 2 DD. Mod-Severe AS (mean-peak Gradient 31 mmHG - 64 mmHg), Mod MR. Mod LA dilation, Mod PA HTN - peak pressure ~54 mmHg). = Progression of AS from 2014  . ULTRASOUND GUIDANCE FOR VASCULAR ACCESS  08/09/2014   Procedure: Ultrasound Guidance For Vascular Access;  Surgeon: Serafina Mitchell, MD;  Location: [facility];  Service: Cardiovascular;;    There were no vitals filed for this visit.  Subjective Assessment - 01/11/19 1018    Subjective  "do not sleep well last night, was hoping for half the time today". Pt reports pain in the left knee. "doctor's appt went well"    Patient Stated Goals  have less pain and walk better    Currently in Pain?  Yes    Pain Score  7     Pain Location  Knee    Pain Orientation  Left                       [facility] Adult PT Treatment/Exercise - 01/11/19 0001      Knee/Hip Exercises: Aerobic   Nustep  L4 x 6 minutes      Knee/Hip Exercises: Machines for Strengthening   Cybex Knee Extension  5# 2x 10, 3 sec hold at the top    Cybex Knee Flexion  20# 2x10, 10 reps RLE only 10#      Knee/Hip Exercises: Standing   Terminal Knee Extension  AROM;Left;20 reps   ball on the wall     Knee/Hip Exercises: Seated   Other Seated Knee/Hip Exercises  Quad sets LLE 2 x10       Manual Therapy   Manual Therapy  Passive ROM    Manual therapy comments  Some PROM taken to end range and held     Passive ROM  L knee flex and Ext                PT Short Term Goals - 12/28/18 1603      PT SHORT TERM GOAL #1   Title  independent with intial HEP    Time  2    Period  Weeks    Status  New         PT Long Term Goals - 01/06/19 1152      PT LONG TERM GOAL #1   Title  decrease pain 50%    Status  On-going      PT LONG TERM GOAL #2   Title  walk with SPC x 400 feet    Status  Partially Met      PT LONG TERM GOAL #3   Title  increase AROM of the left knee to 10 - 110 degrees flexion    Status  Partially Met  Plan - 01/11/19 1056    Clinical Impression Statement  Pt came in using her [place]. Pt requires rest breaks between exercises due to fatigue. Focused on increasing TKE. Exercises were kept the same due to pt fatigue.       Patient will benefit from skilled therapeutic intervention in order to improve the following deficits and impairments:     Visit Diagnosis: Decreased range of motion (ROM) of left knee  Stiffness of left knee, not elsewhere classified     Problem List Patient Active Problem List   Diagnosis Date Noted  . Primary osteoarthritis of left knee 12/01/2018  . Preop cardiovascular exam 05/13/2018  . Chronic diastolic (congestive) heart failure ([place]) 12/30/2017  . GERD (gastroesophageal reflux disease)   . Hyperlipidemia   . S/P TAVR (transcatheter aortic valve replacement)   . Severe aortic stenosis   . Pulmonary nodules   . PAT (paroxysmal atrial tachycardia) ([place]) 02/07/2017  . Chronic fatigue 02/07/2017  . Pulmonary hypertension: Combined secondary as well as lung disease 12/28/2016  . Coronary artery disease, non-occlusive 11/28/2016  . Smoker 06/25/2016  . COPD (chronic obstructive pulmonary disease) ([place]) 05/22/2016  . Osteopenia 08/15/2015  . Chronic pain syndrome 07/16/2015  . Vitamin D deficiency 05/16/2015  . Essential hypertension 07/29/2014    Barrett Henle, [employer] 01/11/2019, 12:04 PM  [place], [place], [address] Phone: [phone]   Fax:  [phone]  Name: Alexandra [name] MRN: [id number] Date of Birth: 09-29-1950

## 2019-01-12 ENCOUNTER — Other Ambulatory Visit (HOSPITAL_COMMUNITY): Payer: Self-pay

## 2019-01-12 MED ORDER — ROSUVASTATIN CALCIUM 10 MG PO TABS: 10.0000 mg | ORAL_TABLET | Freq: Every day | ORAL | 3 refills | Status: DC

## 2019-01-14 ENCOUNTER — Ambulatory Visit: Payer: Medicare Other | Admitting: Physical Therapy

## 2019-01-18 ENCOUNTER — Other Ambulatory Visit: Payer: Self-pay

## 2019-01-18 ENCOUNTER — Ambulatory Visit: Payer: Medicare Other | Admitting: Physical Therapy

## 2019-01-18 DIAGNOSIS — R262 Difficulty in walking, not elsewhere classified: Secondary | ICD-10-CM

## 2019-01-18 DIAGNOSIS — M25562 Pain in left knee: Secondary | ICD-10-CM | POA: Diagnosis not present

## 2019-01-18 DIAGNOSIS — M25662 Stiffness of left knee, not elsewhere classified: Secondary | ICD-10-CM

## 2019-01-18 NOTE — Therapy (Signed)
[place], [place], [address] Phone: [phone]   Fax:  [phone]  Physical Therapy Treatment  Patient Details  Name: Alexandra [name] MRN: [id number] Date of Birth: [birth year] Referring Provider (PT): Dalldorf   Encounter Date: 01/18/2019  PT End of Session - 01/18/19 1100    Visit Number  8    PT Start Time  0045    PT Stop Time  9977    PT Time Calculation (min)  43 min       Past Medical History:  Diagnosis Date  . Arthritis   . COPD (chronic obstructive pulmonary disease) ([place]) 05/22/2016  . Coronary artery disease, non-occlusive 11/28/2016   R&LHC: 65% distal LAD lesion -- likely not angiographically significant->medical management. Normal LVEF. Moderately elevated LVEDP. I aortic valve gradient in the Cath Lab, moderate aortic stenosis. This would suggest that the stenosis is on the moderate side of moderate to severe.  Severe pulmonary hypertension (likely mixed).  AVA 0.96 cm; P-P gradient ~ 30 mmHg, Mean ~24.5-27.5 (Moderate AS)   . GERD (gastroesophageal reflux disease)   . Heart murmur   . Hx of adenomatous colonic polyps 10/19/2015  . Hyperlipidemia   . Hypertension   . Moderate mitral regurgitation by prior echocardiography 10/2016   Echocardiogram revealed moderate mitral regurgitation  . Pulmonary hypertension associated Aortic Stenosis, Mitral Regurgitation & COPD    Peak PAP by Echo ~54 mmHg;  by Cath PAP/Mean 71/32 mmHg, 53 mmHg (with PCWP & LVEDP ~24-26 mmHg); TPG ~26 mmHg  . Pulmonary nodules   . S/P TAVR (transcatheter aortic valve replacement)   . Severe aortic stenosis     Past Surgical History:  Procedure Laterality Date  . APPENDECTOMY    . BREAST CYST EXCISION Right 1970  . CARDIAC VALVE REPLACEMENT    . CATARACT EXTRACTION W/ INTRAOCULAR LENS IMPLANT Right   . CHOLECYSTECTOMY OPEN    . COLONOSCOPY  2003, 2017  . CTA Chest  09/2016   Thoracic Aortic Ca2+ w/o  dilation.  Coronary Calcification noted. PA normal. Mild Emphysematous changes w/ mild LLL scarring.    [name] FRACTURE SURGERY     2007 -right tib /fib fracture ----07/2009 right femur fx after a fall  . FRACTURE SURGERY Right 2011   Femur  . PERIPHERAL VASCULAR CATHETERIZATION N/A 08/09/2014   Procedure: Aortic Arch Angiography;  Surgeon: Serafina Mitchell, MD;  Location: [facility];  Service: Cardiovascular: Type 1 Arch. No significant stenosis, aneurysmal degeneration or dissection.  No luminal irregularity seen in R or L SubClavian, Brachial or Radial A. Chronic distal ulnar A occlusion [place].  [name] PERIPHERAL VASCULAR CATHETERIZATION Right 08/09/2014   Procedure: Upper Extremity Angiography;  Surgeon: Serafina Mitchell, MD;  Location: [facility];  Service: Cardiovascular;  Laterality: Right;  . RIGHT/LEFT HEART CATH AND CORONARY ANGIOGRAPHY N/A 12/06/2016   Procedure: RIGHT/LEFT HEART CATH AND CORONARY ANGIOGRAPHY;  Surgeon: Leonie Man, MD;  [facility]: Cor Angio: 65% dLAD (Med Rx). AVA 0.96 cm; P-P gradient ~ 30 mmHg, Mean ~24.5-27.5 (Mod AS); RHC #s: RAP 8 mmHg, RVP/EDP: 71/8/15 mmHg, PCWP: 21-24 mmHg, PAP/mean: 71/32/52 mmHg = Severe Mixed Pulmonary HTN; LVP/EDP 205/17/26 mmHg ; CO/CI by Fick: 4.35, 2.44 - mildly reduced  . RIGHT/LEFT HEART CATH AND CORONARY ANGIOGRAPHY N/A 11/20/2017   Procedure: RIGHT/LEFT HEART CATH AND CORONARY ANGIOGRAPHY;  Surgeon: Larey Dresser, MD;  Location: [facility];  Service:  Cardiovascular;  Laterality: N/A;  . TEE WITHOUT CARDIOVERSION N/A 12/30/2017   Procedure: TRANSESOPHAGEAL ECHOCARDIOGRAM (TEE);  Surgeon: Burnell Blanks, MD;  Location: [facility];  Service: Open Heart Surgery;  Laterality: N/A;  . TOTAL KNEE ARTHROPLASTY Left 12/01/2018   Procedure: Left Knee Arthroplasty;  Surgeon: Melrose Nakayama, MD;  [facility];  Service: Orthopedics;  Laterality: Left;  . TRANSCATHETER AORTIC VALVE REPLACEMENT,  TRANSFEMORAL  12/30/2017  . TRANSCATHETER AORTIC VALVE REPLACEMENT, TRANSFEMORAL N/A 12/30/2017   Procedure: TRANSCATHETER AORTIC VALVE REPLACEMENT, TRANSFEMORAL;  Surgeon: Burnell Blanks, MD;  Location: [facility];  Service: Open Heart Surgery;  Laterality: N/A;  . TRANSTHORACIC ECHOCARDIOGRAM  10/30/2016   Mild Concentric LVH. EF 55-60%. No RWMA, Gr 2 DD. Mod-Severe AS (mean-peak Gradient 31 mmHG - 64 mmHg), Mod MR. Mod LA dilation, Mod PA HTN - peak pressure ~54 mmHg). = Progression of AS from 2014  . ULTRASOUND GUIDANCE FOR VASCULAR ACCESS  08/09/2014   Procedure: Ultrasound Guidance For Vascular Access;  Surgeon: Serafina Mitchell, MD;  Location: [facility];  Service: Cardiovascular;;    There were no vitals filed for this visit.  Subjective Assessment - 01/18/19 1027    Subjective  "doing pretty good, a little stiff". Pt reports feeling sore after last time.    Currently in Pain?  Yes    Pain Score  6     Pain Location  Knee    Pain Orientation  Left                       [facility] Adult PT Treatment/Exercise - 01/18/19 0001      Ambulation/Gait   Ambulation/Gait  Yes    Ambulation/Gait Assistance  6: Modified independent (Device/Increase time)    Ambulation Distance (Feet)  120 Feet    Assistive device  None    Gait Pattern  Antalgic    Ambulation Surface  Level;Indoor      Knee/Hip Exercises: Aerobic   Nustep  L4 x 6 minutes      Knee/Hip Exercises: Machines for Strengthening   Cybex Knee Extension  5# 2x 10, 3 sec hold at the top    Cybex Knee Flexion  20# 2x10, 10 reps RLE only 10#   LLE w/ eccentric contraction     Manual Therapy   Manual Therapy  Passive ROM    Manual therapy comments  Some PROM taken to end range and held     Passive ROM  L knee flex and Ext, end range and held               PT Short Term Goals - 12/28/18 1603      PT SHORT TERM GOAL #1   Title  independent with intial HEP    Time  2    Period  Weeks    Status   New        PT Long Term Goals - 01/06/19 1152      PT LONG TERM GOAL #1   Title  decrease pain 50%    Status  On-going      PT LONG TERM GOAL #2   Title  walk with SPC x 400 feet    Status  Partially Met      PT LONG TERM GOAL #3   Title  increase AROM of the left knee to 10 - 110 degrees flexion    Status  Partially Met  Plan - 01/18/19 1102    Clinical Impression Statement  Pt able to walk without assistive device for 100 ft. Pt required one rest break whiule walking. pt did eccentric knee extension on LLE. Pt was guarding during PROM.    Personal Factors and Comorbidities  Fitness;Comorbidity 3+    Examination-Activity Limitations  Transfers;Locomotion Level;Stand;Stairs;Squat;Sleep    Examination-Participation Restrictions  Cleaning;Shop;Laundry    Rehab Potential  Good    PT Frequency  3x / week    PT Duration  8 weeks    PT Treatment/Interventions  ADLs/Self Care Home Management;Cryotherapy;Electrical Stimulation;Moist Heat;Ultrasound;Gait training;Stair training;Functional mobility training;Therapeutic activities;Therapeutic exercise;Balance training;Neuromuscular re-education;Patient/family education;Manual techniques;Vasopneumatic Device;Scar mobilization    PT Next Visit Plan  continue to work on ROM and progress functional exercises       Patient will benefit from skilled therapeutic intervention in order to improve the following deficits and impairments:  Abnormal gait, Decreased range of motion, Difficulty walking, Decreased activity tolerance, Pain, Impaired flexibility, Decreased balance, Decreased mobility, Decreased strength, Increased edema  Visit Diagnosis: Decreased range of motion (ROM) of left knee  Difficulty in walking, not elsewhere classified     Problem List Patient Active Problem List   Diagnosis Date Noted  . Primary osteoarthritis of left knee 12/01/2018  . Preop cardiovascular exam 05/13/2018  . Chronic diastolic  (congestive) heart failure ([place]) 12/30/2017  . GERD (gastroesophageal reflux disease)   . Hyperlipidemia   . S/P TAVR (transcatheter aortic valve replacement)   . Severe aortic stenosis   . Pulmonary nodules   . PAT (paroxysmal atrial tachycardia) ([place]) 02/07/2017  . Chronic fatigue 02/07/2017  . Pulmonary hypertension: Combined secondary as well as lung disease 12/28/2016  . Coronary artery disease, non-occlusive 11/28/2016  . Smoker 06/25/2016  . COPD (chronic obstructive pulmonary disease) ([place]) 05/22/2016  . Osteopenia 08/15/2015  . Chronic pain syndrome 07/16/2015  . Vitamin D deficiency 05/16/2015  . Essential hypertension 07/29/2014    Barrett Henle, [place] 01/18/2019, 11:05 AM  [place], [place], [address] Phone: [phone]   Fax:  [phone]  Name: Alexandra [name] MRN: [id number] Date of Birth: 06-Mar-1951

## 2019-01-21 ENCOUNTER — Ambulatory Visit: Payer: Medicare Other | Admitting: Physical Therapy

## 2019-01-22 ENCOUNTER — Telehealth: Payer: Self-pay | Admitting: *Deleted

## 2019-01-22 DIAGNOSIS — R911 Solitary pulmonary nodule: Secondary | ICD-10-CM

## 2019-01-22 NOTE — Telephone Encounter (Signed)
Order placed for CT to be done at [facility].

## 2019-01-22 NOTE — Telephone Encounter (Signed)
-----   Message from Osvaldo Shipper, [place] sent at 01/21/2019  4:54 PM EDT ----- Regarding: CT need New order and to be Resch Please put in new order and reschedule patient at another facility. We are unable to do CT because of insurance. If at all possible please reschedule patient on 11/12 prior to office visit and ECHO at [facility] per patient request. Pt was scheduled 11/12 for CT Chest WO .     Pt is aware appt was canceled.  Thank you   Erline Levine

## 2019-01-24 HISTORY — PX: TRANSTHORACIC ECHOCARDIOGRAM: SHX275

## 2019-01-25 ENCOUNTER — Encounter: Payer: Medicare Other | Admitting: Physical Therapy

## 2019-01-29 ENCOUNTER — Encounter: Payer: Medicare Other | Admitting: Physical Therapy

## 2019-02-01 NOTE — Progress Notes (Deleted)
[facility] [place]                                       Cardiology Office Note    Date:  02/01/2019   ID:  Alexandra [name], DOB 08/10/1950, MRN [id number]  PCP:  [facility]  Cardiologist: Dr. Ellyn Hack Dr. Angelena Form & Dr. Cyndia Bent (TAVR)  CC: 1 month s/p TAVR   History of Present Illness:  Alexandra [name] is a 68 y.o. female with a history of HTN, HLD,COPD, chronic diastolic CHF, pulmonary HTN, LBBB, ongoing tobacco abuseandsevereaortic stenosiss/p TAVR (12/30/17) who presents to clinic for follow up.   An echo in 10/2016 showed a mean aortic valve gradient of 31 mmHg with a dimensionless index of 0.24. She was seen in our office at that time by Dr. Prescott Gum who was seeing her for a right lower lobe nodular density which resolved on subsequent CT scan. She was not having any clear symptoms of congestive heart failure and was therefore referred back to cardiology for follow-up. Over the past several months she developed exertional fatigue and shortness of breath as well as two-pillow orthopnea. She has been followed by Dr. Aundra Dubin and was treated for volume overload with Lasix. She had a follow-up echocardiogram on 10/31/2017 showed normal LV function with severe AS with a mean valve gradient of 32 mmHg with a peak of 62 mmHg. R/LHC 11/20/2017 showed nonobstructive coronary disease, mild pulmonary hypertension and low right heart pressures. Lasix was reduced.   She was evaluated by the multidisciplinary valve team and ultimately underwent successful TAVR with a53mm Edwards Sapien 3 THV via the TF approach on 12/30/17. Post operative echo showed EF 60%, normally functioning TAVR valve with mean gradient of 22 mm Hg and no PVL. Diltiazem was discontinued at discharge given sinus bradycardia.   She underwent left knee arthroplasty on 12/01/18.   Today she presents to clinic for follow up.    Past Medical History:   Diagnosis Date  . Arthritis   . COPD (chronic obstructive pulmonary disease) ([place]) 05/22/2016  . Coronary artery disease, non-occlusive 11/28/2016   R&LHC: 65% distal LAD lesion -- likely not angiographically significant->medical management. Normal LVEF. Moderately elevated LVEDP. I aortic valve gradient in the Cath Lab, moderate aortic stenosis. This would suggest that the stenosis is on the moderate side of moderate to severe.  Severe pulmonary hypertension (likely mixed).  AVA 0.96 cm; P-P gradient ~ 30 mmHg, Mean ~24.5-27.5 (Moderate AS)   . GERD (gastroesophageal reflux disease)   . Heart murmur   . Hx of adenomatous colonic polyps 10/19/2015  . Hyperlipidemia   . Hypertension   . Moderate mitral regurgitation by prior echocardiography 10/2016   Echocardiogram revealed moderate mitral regurgitation  . Pulmonary hypertension associated Aortic Stenosis, Mitral Regurgitation & COPD    Peak PAP by Echo ~54 mmHg;  by Cath PAP/Mean 71/32 mmHg, 53 mmHg (with PCWP & LVEDP ~24-26 mmHg); TPG ~26 mmHg  . Pulmonary nodules   . S/P TAVR (transcatheter aortic valve replacement)   . Severe aortic stenosis     Past Surgical History:  Procedure Laterality Date  . APPENDECTOMY    . BREAST CYST EXCISION Right 1970  . CARDIAC VALVE REPLACEMENT    . CATARACT EXTRACTION W/ INTRAOCULAR LENS IMPLANT Right   . CHOLECYSTECTOMY OPEN    . COLONOSCOPY  2003, 2017  . CTA Chest  09/2016   Thoracic Aortic Ca2+ w/o dilation.  Coronary Calcification noted. PA normal. Mild Emphysematous changes w/ mild LLL scarring.    [name] FRACTURE SURGERY     2007 -right tib /fib fracture ----07/2009 right femur fx after a fall  . FRACTURE SURGERY Right 2011   Femur  . PERIPHERAL VASCULAR CATHETERIZATION N/A 08/09/2014   Procedure: Aortic Arch Angiography;  Surgeon: Serafina Mitchell, MD;  Location: [facility];  Service: Cardiovascular: Type 1 Arch. No significant stenosis, aneurysmal degeneration or dissection.  No luminal  irregularity seen in R or L SubClavian, Brachial or Radial A. Chronic distal ulnar A occlusion [place].  [name] PERIPHERAL VASCULAR CATHETERIZATION Right 08/09/2014   Procedure: Upper Extremity Angiography;  Surgeon: Serafina Mitchell, MD;  Location: [facility];  Service: Cardiovascular;  Laterality: Right;  . RIGHT/LEFT HEART CATH AND CORONARY ANGIOGRAPHY N/A 12/06/2016   Procedure: RIGHT/LEFT HEART CATH AND CORONARY ANGIOGRAPHY;  Surgeon: Leonie Man, MD;  Location: [facility]: Cor Angio: 65% dLAD (Med Rx). AVA 0.96 cm; P-P gradient ~ 30 mmHg, Mean ~24.5-27.5 (Mod AS); RHC #s: RAP 8 mmHg, RVP/EDP: 71/8/15 mmHg, PCWP: 21-24 mmHg, PAP/mean: 71/32/52 mmHg = Severe Mixed Pulmonary HTN; LVP/EDP 205/17/26 mmHg ; CO/CI by Fick: 4.35, 2.44 - mildly reduced  . RIGHT/LEFT HEART CATH AND CORONARY ANGIOGRAPHY N/A 11/20/2017   Procedure: RIGHT/LEFT HEART CATH AND CORONARY ANGIOGRAPHY;  Surgeon: Larey Dresser, MD;  Location: [facility];  Service: Cardiovascular;  Laterality: N/A;  . TEE WITHOUT CARDIOVERSION N/A 12/30/2017   Procedure: TRANSESOPHAGEAL ECHOCARDIOGRAM (TEE);  Surgeon: Burnell Blanks, MD;  Location: [facility];  Service: Open Heart Surgery;  Laterality: N/A;  . TOTAL KNEE ARTHROPLASTY Left 12/01/2018   Procedure: Left Knee Arthroplasty;  Surgeon: Melrose Nakayama, MD;  Location: [facility];  Service: Orthopedics;  Laterality: Left;  . TRANSCATHETER AORTIC VALVE REPLACEMENT, TRANSFEMORAL  12/30/2017  . TRANSCATHETER AORTIC VALVE REPLACEMENT, TRANSFEMORAL N/A 12/30/2017   Procedure: TRANSCATHETER AORTIC VALVE REPLACEMENT, TRANSFEMORAL;  Surgeon: Burnell Blanks, MD;  Location: [facility];  Service: Open Heart Surgery;  Laterality: N/A;  . TRANSTHORACIC ECHOCARDIOGRAM  10/30/2016   Mild Concentric LVH. EF 55-60%. No RWMA, Gr 2 DD. Mod-Severe AS (mean-peak Gradient 31 mmHG - 64 mmHg), Mod MR. Mod LA dilation, Mod PA HTN - peak pressure ~54 mmHg). = Progression of AS from 2014  .  ULTRASOUND GUIDANCE FOR VASCULAR ACCESS  08/09/2014   Procedure: Ultrasound Guidance For Vascular Access;  Surgeon: Serafina Mitchell, MD;  Location: [facility];  Service: Cardiovascular;;    Current Medications: Outpatient Medications Prior to Visit  Medication Sig Dispense Refill  . amLODipine (NORVASC) 10 MG tablet Take 1 tablet (10 mg total) by mouth daily. (Patient not taking: Reported on 11/23/2018) 90 tablet 3  . amoxicillin (AMOXIL) 500 MG tablet Take 2,000 mg (four tablets) one hour prior to dental visits. 8 tablet 3  . aspirin 81 MG tablet Take 1 tablet (81 mg total) by mouth 2 (two) times daily at 10 AM and 5 PM. 30 tablet 0  . Azelastine HCl 0.15 % SOLN Place 2 sprays into the nose 2 (two) times daily as needed (allergies). 30 mL PRN  . bisoprolol-hydrochlorothiazide (ZIAC) 10-6.25 MG tablet Take 1 tablet by mouth daily. 30 tablet 6  . clopidogrel (PLAVIX) 75 MG tablet TAKE 1 TABLET(75 MG) BY MOUTH DAILY WITH BREAKFAST (Patient not taking: Reported on 11/23/2018) 90 tablet 1  .  fluticasone (FLOVENT HFA) 220 MCG/ACT inhaler Inhale 2 puffs into the lungs 2 (two) times daily as needed (wheezing and shortness of breath).     . furosemide (LASIX) 20 MG tablet Take 1 tablet (20 mg total) by mouth 2 (two) times daily. (Patient taking differently: Take 20 mg by mouth daily. ) 60 tablet 3  . losartan (COZAAR) 100 MG tablet Take 1 tablet (100 mg total) by mouth daily. 90 tablet 3  . montelukast (SINGULAIR) 10 MG tablet Take 1 tablet (10 mg total) by mouth at bedtime. (Patient not taking: Reported on 11/23/2018) 30 tablet 3  . oxybutynin (DITROPAN-XL) 10 MG 24 hr tablet TAKE 1 TABLET BY MOUTH AT BEDTIME (Patient taking differently: Take 10 mg by mouth at bedtime as needed (urinary urgency). ) 90 tablet 0  . oxyCODONE-acetaminophen (PERCOCET) 10-325 MG tablet Take 1-2 tablets by mouth every 6 (six) hours as needed for pain. 40 tablet 0  . potassium chloride SA (K-DUR,KLOR-CON) 20 MEQ tablet Take  1 tablet (20 mEq total) by mouth daily. 30 tablet 6  . promethazine (PHENERGAN) 12.5 MG tablet Take 1-2 tablets (12.5-25 mg total) by mouth every 6 (six) hours as needed for nausea or vomiting. 30 tablet 1  . rosuvastatin (CRESTOR) 10 MG tablet Take 1 tablet (10 mg total) by mouth daily. 90 tablet 3  . tiZANidine (ZANAFLEX) 4 MG tablet Take 1 tablet (4 mg total) by mouth every 6 (six) hours as needed. 40 tablet 1  . Vitamin D, Ergocalciferol, (DRISDOL) 1.25 MG (50000 UT) CAPS capsule Take 50,000 Units by mouth every 7 (seven) days.     No facility-administered medications prior to visit.      Allergies:   Codeine and Symproic [naldemedine]   Social History   Socioeconomic History  . Marital status: Married    Spouse name: [name]  . Number of children: 3  . Years of education: 12th grade  . Highest education level: Not on file  Occupational History  . 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 Financial resource strain: Not on file  . Food insecurity    Worry: Not on file    Inability: Not on file  . Transportation needs    Medical: Not on file    Non-medical: Not on file  Tobacco Use  . Smoking status: Light Tobacco Smoker    Packs/day: 0.10    Years: 40.00    Pack years: 4.00    Types: Cigarettes  . Smokeless tobacco: Never Used  . Tobacco comment: 1-2 cigarettes a day  Substance and Sexual Activity  . Alcohol use: Not Currently    Alcohol/week: 0.0 standard drinks  . Drug use: Never    Comment: +cocaine in urine 12/19/2014; pt denies this hx on 12/30/2017  . Sexual activity: Not Currently    Partners: Male  Lifestyle  . Physical activity    Days per week: Not on file    Minutes per session: Not on file  . Stress: Not on file  Relationships  . Social Herbalist on phone: Not on file    Gets together: Not on file    Attends religious service: Not on file    Active  member of club or organization: Not on file    Attends meetings of clubs or organizations: Not on file    Relationship status: Not on file  Other Topics Concern  . Not on  file  Social History Narrative   Lives with her husband and her middle son.   Her two older sons are products of a previous relationship.   Her youngest son is from her current marriage.   Oldest and youngest sons live nearby.   She has total of 3 children and 6 grandchildren with 2 great-grandchildren.   She does not exercise because she has pain in her right leg.     Family History:  The patient's family history includes Heart attack in her brother; Heart disease in her father and mother; Hypertension in her mother.     ROS:   Please see the history of present illness.    ROS All other systems reviewed and are negative.   PHYSICAL EXAM:   VS:  There were no vitals taken for this visit.   GEN: Well nourished, well developed, in no acute distress HEENT: normal Neck: no JVD or masses Cardiac: ***RRR; no murmurs, rubs, or gallops,no edema  Respiratory:  clear to auscultation bilaterally, normal work of breathing GI: soft, nontender, nondistended, + BS MS: no deformity or atrophy Skin: warm and dry, no rash Neuro:  Alert and Oriented x 3, Strength and sensation are intact Psych: euthymic mood, full affect   Wt Readings from Last 3 Encounters:  12/01/18 157 lb 2 oz (71.3 kg)  11/26/18 157 lb 2 oz (71.3 kg)  06/04/18 155 lb 9.6 oz (70.6 kg)      Studies/Labs Reviewed:   EKG:  EKG is*** ordered today.  The ekg ordered today demonstrates ***  Recent Labs: 11/26/2018: BUN 19; Creatinine, Ser 1.15; Hemoglobin 12.8; Platelets 186; Potassium 4.1; Sodium 137   Lipid Panel    Component Value Date/Time   CHOL 133 03/05/2018 1135   CHOL 234 (H) 06/17/2017 1116   TRIG 109 03/05/2018 1135   HDL 41 03/05/2018 1135   HDL 39 (L) 06/17/2017 1116   CHOLHDL 3.2 03/05/2018 1135   VLDL 22 03/05/2018 1135   LDLCALC 70  03/05/2018 1135   LDLCALC 166 (H) 06/17/2017 1116    Additional studies/ records that were reviewed today include:  TAVR OPERATIVE NOTE   Date of Procedure:12/30/2017  Preoperative Diagnosis:Severe Aortic Stenosis   Postoperative Diagnosis:Same   Procedure:   Transcatheter Aortic Valve Replacement - Transfemoral Approach Edwards Sapien 3 THV (size 58mm, model # [id number], serial [id number])  Co-Surgeons:Christopher Angelena Form, MD and Gaye Pollack, MD   Pre-operative Echo Findings: ? Severe aortic stenosis ? Normalleft ventricular systolic function  Post-operative Echo Findings: ? Mildparavalvular leak ? Normalleft ventricular systolic function  _______________  Echo 02/04/18 Study Conclusions - Left ventricle: The cavity size was normal. There was mild focal basal hypertrophy of the septum. Systolic function was normal. The estimated ejection fraction was in the range of 55% to 60%. Wall motion was normal; there were no regional wall motion abnormalities. Doppler parameters are consistent with abnormal left ventricular relaxation (grade 1 diastolic dysfunction). - Aortic valve: A prosthesis was present and functioning normally. The prosthesis had a normal range of motion. The sewing ring appeared normal, had no rocking motion, and showed no evidence of dehiscence. There was mild stenosis. Mean gradient (S): 19 mm Hg. Peak gradient (S): 36 mm Hg. - Mitral valve: Calcified annulus. There was mild to moderate regurgitation. - Left atrium: The atrium was severely dilated. - Right ventricle: Systolic function was normal. - Atrial septum: No defect or patent foramen ovale was identified. - Tricuspid valve: There was trivial regurgitation. - Pulmonic valve: There  was no significant regurgitation. - Pulmonary arteries: PA peak pressure: 33 mm Hg (S). Impressions: - S/P  TAVR. Valve mean gradient has gone from 7 (procedure)->25->19 mmHg and peak gradient from 16 (procedure) ->45 ->36 mmHg. Otherwise study largely unchanged from prior.  _______________  Echo 02/04/19 ***   ASSESSMENT & PLAN:   Severe AS s/p TAVR:    HTN:    Chronic diastolic CHF:    COPD:    Pulmonary nodules: multiple small pulmonary nodules noted throughout the lungs bilaterally, largest of which measures 5 mm in the right lower lobe.seen on pre op TAVR scans. She has been followed for a right pulmonary nodule in the past that apparently resolved on its own. Radiology has recommended follow up with a non contrast CT at 12 months if high risk (she is a long time, heavy smoker.) CT done today.     Medication Adjustments/Labs and Tests Ordered: Current medicines are reviewed at length with the patient today.  Concerns regarding medicines are outlined above.  Medication changes, Labs and Tests ordered today are listed in the Patient Instructions below. There are no Patient Instructions on file for this visit.   Signed, Angelena Form, PA-C  02/01/2019 9:14 AM    [facility] [address], [place], [place]  [address] Phone: [phone]; Fax: [phone]

## 2019-02-02 ENCOUNTER — Other Ambulatory Visit (HOSPITAL_COMMUNITY): Payer: Medicare Other

## 2019-02-04 ENCOUNTER — Inpatient Hospital Stay: Admission: RE | Admit: 2019-02-04 | Payer: Medicare Other | Source: Ambulatory Visit

## 2019-02-04 ENCOUNTER — Ambulatory Visit: Payer: Medicare Other | Admitting: Physician Assistant

## 2019-02-04 ENCOUNTER — Other Ambulatory Visit: Payer: Medicare Other

## 2019-02-04 ENCOUNTER — Other Ambulatory Visit (HOSPITAL_COMMUNITY): Payer: Medicare Other

## 2019-02-11 ENCOUNTER — Ambulatory Visit
Admission: RE | Admit: 2019-02-11 | Discharge: 2019-02-11 | Disposition: A | Discharge status: Home / Self Care | Payer: Medicare Other | Source: Ambulatory Visit | Attending: Physician Assistant | Admitting: Physician Assistant

## 2019-02-11 DIAGNOSIS — R911 Solitary pulmonary nodule: Secondary | ICD-10-CM

## 2019-02-15 ENCOUNTER — Other Ambulatory Visit (HOSPITAL_COMMUNITY): Payer: Medicare Other

## 2019-02-17 ENCOUNTER — Other Ambulatory Visit: Payer: Self-pay

## 2019-02-17 ENCOUNTER — Ambulatory Visit (HOSPITAL_COMMUNITY): Payer: Medicare Other | Attending: Cardiovascular Disease

## 2019-02-17 DIAGNOSIS — Z952 Presence of prosthetic heart valve: Secondary | ICD-10-CM

## 2019-02-22 ENCOUNTER — Other Ambulatory Visit (HOSPITAL_COMMUNITY): Payer: Self-pay

## 2019-02-22 MED ORDER — POTASSIUM CHLORIDE CRYS ER 20 MEQ PO TBCR: 20.0000 meq | EXTENDED_RELEASE_TABLET | Freq: Every day | ORAL | 6 refills | Status: DC

## 2019-02-23 ENCOUNTER — Other Ambulatory Visit (HOSPITAL_COMMUNITY): Payer: Medicare Other

## 2019-02-25 NOTE — Progress Notes (Signed)
[facility]   MULTIDISCIPLINARY HEART VALVE TEAM  Virtual Visit via Telephone Note   This visit type was conducted due to national recommendations for restrictions regarding the COVID-19 Pandemic (e.g. social distancing) in an effort to limit this patient's exposure and mitigate transmission in our community.  Due to her co-morbid illnesses, this patient is at least at moderate risk for complications without adequate follow up.  This format is felt to be most appropriate for this patient at this time.  The patient did not have access to video technology/had technical difficulties with video requiring transitioning to audio format only (telephone).  All issues noted in this document were discussed and addressed.  No physical exam could be performed with this format.  Please refer to the patient's chart for her  consent to telehealth for [facility].   Evaluation Performed:  Follow-up visit  Date:  02/26/2019   ID:  Alexandra [name], DOB 07/11/1950, MRN [id number]  Patient Location: Home Provider Location: Office  PCP:  [facility]  Cardiologist:  Glenetta Hew, MD / Dr. Angelena Form & Dr. Cyndia Bent (TAVR) Electrophysiologist:  None   Chief Complaint:  1 year s/p TAVR  History of Present Illness:    Alexandra [name] is a 68 y.o. female with a history of HTN, HLD, COPD, chronic diastolic CHF, pulmonary HTN, LBBB, ongoing tobacco abuse and severe aortic stenosis s/p TAVR (12/30/17) who presents to clinic for follow up.   An echo in 10/2016 showed a mean aortic valve gradient of 31 mmHg with a dimensionless index of 0.24. She was seen in our office at that time by Dr. Prescott Gum who was seeing her for a right lower lobe nodular density which resolved on subsequent CT scan. She was not having any clear symptoms of congestive heart failure and was therefore referred back to cardiology for follow-up. Over the past several months she developed exertional fatigue and  shortness of breath as well as two-pillow orthopnea. She has been followed by Dr. Aundra Dubin and was treated for volume overload with Lasix. She had a follow-up echocardiogram on 10/31/2017 showed normal LV function with severe AS with a mean valve gradient of 32 mmHg with a peak of 62 mmHg. R/LHC 11/20/2017 showed nonobstructive coronary disease, mild pulmonary hypertension and low right heart pressures. Lasix was reduced.   She was evaluated by the multidisciplinary valve team and ultimately underwent successful TAVR with a 23 mm Edwards Sapien 3 THV via the TF approach on 12/30/17. Post operative echo showed EF 60%, normally functioning TAVR valve with mean gradient of 22 mm Hg and no PVL. Diltiazem was discontinued at discharge given sinus bradycardia.   She underwent left knee arthroplasty on 12/01/18.   Today she presents to clinic for follow up. She is still a little wobbly from knee surgery. Hasn't been as active. She has had swelling in her left leg after surgery. No chest pain. No orthopnea or PND. No dizziness or syncope. She is just very worried about her husband who is currently admitted to the hospital right now and upset she cannot go to see him.  The patient does not have symptoms concerning for COVID-19 infection (fever, chills, cough, or new shortness of breath).    Past Medical History:  Diagnosis Date   Arthritis    COPD (chronic obstructive pulmonary disease) ([place]) 05/22/2016   Coronary artery disease, non-occlusive 11/28/2016   R&LHC: 65% distal LAD lesion -- likely not angiographically significant->medical management. Normal LVEF.  Moderately elevated LVEDP. I aortic valve gradient in the Cath Lab, moderate aortic stenosis. This would suggest that the stenosis is on the moderate side of moderate to severe.  Severe pulmonary hypertension (likely mixed).  AVA 0.96 cm; P-P gradient ~ 30 mmHg, Mean ~24.5-27.5 (Moderate AS)    GERD (gastroesophageal reflux disease)    Heart murmur     Hx of adenomatous colonic polyps 10/19/2015   Hyperlipidemia    Hypertension    Moderate mitral regurgitation by prior echocardiography 10/2016   Echocardiogram revealed moderate mitral regurgitation   Pulmonary hypertension associated Aortic Stenosis, Mitral Regurgitation & COPD    Peak PAP by Echo ~54 mmHg;  by Cath PAP/Mean 71/32 mmHg, 53 mmHg (with PCWP & LVEDP ~24-26 mmHg); TPG ~26 mmHg   Pulmonary nodules    S/P TAVR (transcatheter aortic valve replacement)    Severe aortic stenosis    Past Surgical History:  Procedure Laterality Date   APPENDECTOMY     BREAST CYST EXCISION Right 1970   CARDIAC VALVE REPLACEMENT     CATARACT EXTRACTION W/ INTRAOCULAR LENS IMPLANT Right    CHOLECYSTECTOMY OPEN     COLONOSCOPY  2003, 2017   CTA Chest  09/2016   Thoracic Aortic Ca2+ w/o dilation.  Coronary Calcification noted. PA normal. Mild Emphysematous changes w/ mild LLL scarring.     FRACTURE SURGERY     2007 -right tib /fib fracture ----07/2009 right femur fx after a fall   FRACTURE SURGERY Right 2011   Femur   PERIPHERAL VASCULAR CATHETERIZATION N/A 08/09/2014   Procedure: Aortic Arch Angiography;  Surgeon: Serafina Mitchell, MD;  Location: [place] [facility];  Service: Cardiovascular: Type 1 Arch. No significant stenosis, aneurysmal degeneration or dissection.  No luminal irregularity seen in R or L SubClavian, Brachial or Radial A. Chronic distal ulnar A occlusion [place].   PERIPHERAL VASCULAR CATHETERIZATION Right 08/09/2014   Procedure: Upper Extremity Angiography;  Surgeon: Serafina Mitchell, MD;  Location: [place] [facility];  Service: Cardiovascular;  Laterality: Right;   RIGHT/LEFT HEART CATH AND CORONARY ANGIOGRAPHY N/A 12/06/2016   Procedure: RIGHT/LEFT HEART CATH AND CORONARY ANGIOGRAPHY;  Surgeon: Leonie Man, MD;  Location: [facility]: Cor Angio: 65% dLAD (Med Rx). AVA 0.96 cm; P-P gradient ~ 30 mmHg, Mean ~24.5-27.5 (Mod AS); RHC #s: RAP 8 mmHg,  RVP/EDP: 71/8/15 mmHg, PCWP: 21-24 mmHg, PAP/mean: 71/32/52 mmHg = Severe Mixed Pulmonary HTN; LVP/EDP 205/17/26 mmHg ; CO/CI by Fick: 4.35, 2.44 - mildly reduced   RIGHT/LEFT HEART CATH AND CORONARY ANGIOGRAPHY N/A 11/20/2017   Procedure: RIGHT/LEFT HEART CATH AND CORONARY ANGIOGRAPHY;  Surgeon: Larey Dresser, MD;  Location: [place] [facility];  Service: Cardiovascular;  Laterality: N/A;   TEE WITHOUT CARDIOVERSION N/A 12/30/2017   Procedure: TRANSESOPHAGEAL ECHOCARDIOGRAM (TEE);  Surgeon: Burnell Blanks, MD;  Location: [place];  Service: Open Heart Surgery;  Laterality: N/A;   TOTAL KNEE ARTHROPLASTY Left 12/01/2018   Procedure: Left Knee Arthroplasty;  Surgeon: Melrose Nakayama, MD;  Location: [facility];  Service: Orthopedics;  Laterality: Left;   TRANSCATHETER AORTIC VALVE REPLACEMENT, TRANSFEMORAL  12/30/2017   TRANSCATHETER AORTIC VALVE REPLACEMENT, TRANSFEMORAL N/A 12/30/2017   Procedure: TRANSCATHETER AORTIC VALVE REPLACEMENT, TRANSFEMORAL;  Surgeon: Burnell Blanks, MD;  Location: [place];  Service: Open Heart Surgery;  Laterality: N/A;   TRANSTHORACIC ECHOCARDIOGRAM  10/30/2016   Mild Concentric LVH. EF 55-60%. No RWMA, Gr 2 DD. Mod-Severe AS (mean-peak Gradient 31 mmHG - 64 mmHg), Mod MR. Mod LA dilation,  Mod PA HTN - peak pressure ~54 mmHg). = Progression of AS from 2014   [place]  08/09/2014   Procedure: Ultrasound Guidance For Vascular Access;  Surgeon: Serafina Mitchell, MD;  Location: [place] [facility];  Service: Cardiovascular;;     Current Meds  Medication Sig   amLODipine (NORVASC) 10 MG tablet Take 1 tablet (10 mg total) by mouth daily.   amoxicillin (AMOXIL) 500 MG tablet Take 2,000 mg (four tablets) one hour prior to dental visits.   aspirin 81 MG tablet Take 1 tablet (81 mg total) by mouth 2 (two) times daily at 10 AM and 5 PM.   Azelastine HCl 0.15 % SOLN Place 2 sprays into the nose 2 (two) times daily as needed  (allergies).   bisoprolol-hydrochlorothiazide (ZIAC) 10-6.25 MG tablet Take 1 tablet by mouth daily.   clopidogrel (PLAVIX) 75 MG tablet TAKE 1 TABLET(75 MG) BY MOUTH DAILY WITH BREAKFAST   fluticasone (FLOVENT HFA) 220 MCG/ACT inhaler Inhale 2 puffs into the lungs 2 (two) times daily as needed (wheezing and shortness of breath).    furosemide (LASIX) 20 MG tablet Take 1 tablet (20 mg total) by mouth 2 (two) times daily. (Patient taking differently: Take 20 mg by mouth daily. )   losartan (COZAAR) 100 MG tablet Take 1 tablet (100 mg total) by mouth daily.   montelukast (SINGULAIR) 10 MG tablet Take 1 tablet (10 mg total) by mouth at bedtime.   oxybutynin (DITROPAN-XL) 10 MG 24 hr tablet TAKE 1 TABLET BY MOUTH AT BEDTIME (Patient taking differently: Take 10 mg by mouth at bedtime as needed (urinary urgency). )   oxyCODONE-acetaminophen (PERCOCET) 10-325 MG tablet Take 1-2 tablets by mouth every 6 (six) hours as needed for pain.   potassium chloride SA (KLOR-CON) 20 MEQ tablet Take 1 tablet (20 mEq total) by mouth daily.   promethazine (PHENERGAN) 12.5 MG tablet Take 1-2 tablets (12.5-25 mg total) by mouth every 6 (six) hours as needed for nausea or vomiting.   rosuvastatin (CRESTOR) 10 MG tablet Take 1 tablet (10 mg total) by mouth daily.   tiZANidine (ZANAFLEX) 4 MG tablet Take 1 tablet (4 mg total) by mouth every 6 (six) hours as needed.   Vitamin D, Ergocalciferol, (DRISDOL) 1.25 MG (50000 UT) CAPS capsule Take 50,000 Units by mouth every 7 (seven) days.   [DISCONTINUED] amoxicillin (AMOXIL) 500 MG tablet Take 2,000 mg (four tablets) one hour prior to dental visits.     Allergies:   Codeine and Symproic [naldemedine]   Social History   Tobacco Use   Smoking status: Light Tobacco Smoker    Packs/day: 0.10    Years: 40.00    Pack years: 4.00    Types: Cigarettes   Smokeless tobacco: Never Used   Tobacco comment: 1-2 cigarettes a day  Substance Use Topics   Alcohol use:  Not Currently    Alcohol/week: 0.0 standard drinks   Drug use: Never    Comment: +cocaine in urine 12/19/2014; pt denies this hx on 12/30/2017     Family Hx: The patient's family history includes Heart attack in her brother; Heart disease in her father and mother; Hypertension in her mother. There is no history of Colon cancer, Colon polyps, Esophageal cancer, Rectal cancer, or Stomach cancer.  ROS:   Please see the history of present illness.    All other systems reviewed and are negative.   Prior CV studies:   The following studies were reviewed today:  TAVR OPERATIVE  NOTE      Date of Procedure:        12/30/2017    Preoperative Diagnosis:   Severe Aortic Stenosis    Postoperative Diagnosis:  Same    Procedure:      " Transcatheter Aortic Valve Replacement - Transfemoral Approach       Edwards Sapien 3 THV (size 23 mm, model # [id number], serial # [id number])    Co-Surgeons:            Lauree Chandler, MD and Gaye Pollack, MD    Pre-operative Echo Findings:  ?Severe aortic stenosis  ? Normal left ventricular systolic function    Post-operative Echo Findings:  ? Mild paravalvular leak  ? Normal left ventricular systolic function    _______________    Echo 02/04/18  Study Conclusions  - Left ventricle: The cavity size was normal. There was mild focal  basal hypertrophy of the septum. Systolic function was normal.  The estimated ejection fraction was in the range of 55% to 60%.  Wall motion was normal; there were no regional wall motion  abnormalities. Doppler parameters are consistent with abnormal  left ventricular relaxation (grade 1 diastolic dysfunction).  - Aortic valve: A prosthesis was present and functioning normally.  The prosthesis had a normal range of motion. The sewing ring  appeared normal, had no rocking motion, and showed no evidence of  dehiscence. There was mild stenosis. Mean  gradient (S): 19 mm Hg.  Peak gradient (S): 36 mm Hg.  - Mitral valve: Calcified annulus. There was mild to moderate  regurgitation.  - Left atrium: The atrium was severely dilated.  - Right ventricle: Systolic function was normal.  - Atrial septum: No defect or patent foramen ovale was identified.  - Tricuspid valve: There was trivial regurgitation.  - Pulmonic valve: There was no significant regurgitation.  - Pulmonary arteries: PA peak pressure: 33 mm Hg (S).  Impressions:  - S/P TAVR. Valve mean gradient has gone from 7 (procedure)->25->19  mmHg and peak gradient from 16 (procedure) ->45 -> 36 mmHg.  Otherwise study largely unchanged from prior.    _______________    Echo 02/04/19  IMPRESSIONS  1. Left ventricular ejection fraction, by visual estimation, is 55 to 60%. The left ventricle has normal function. There is no left ventricular hypertrophy.  2. Elevated left atrial pressure.  3. Left ventricular diastolic parameters are consistent with Grade I diastolic dysfunction (impaired relaxation).  4. The left ventricle has no regional wall motion abnormalities.  5. Global right ventricle has normal systolic function.The right ventricular size is normal. No increase in right ventricular wall thickness.  6. Left atrial size was moderately dilated.  7. Right atrial size was normal.  8. Moderate mitral annular calcification.  9. The mitral valve is normal in structure. Moderate mitral valve regurgitation. No evidence of mitral stenosis. 10. The tricuspid valve is normal in structure. Tricuspid valve regurgitation is trivial. 11. Aortic valve regurgitation is not visualized. 12. The pulmonic valve was grossly normal. Pulmonic valve regurgitation is not visualized. 13. Moderately elevated pulmonary artery systolic pressure. 14. The inferior vena cava is dilated in size with >50% respiratory variability, suggesting right atrial pressure of 8 mmHg.  In comparison to the  previous echocardiogram(s): 02/04/18 EF 55-60%. AV 7mmHg mean, 33mmHg peak.  Aortic Valve: The aortic valve has been repaired/replaced. Aortic valve regurgitation is not visualized. Aortic valve mean gradient measures 13.0 mmHg. Aortic valve peak gradient measures 20.6 mmHg. Aortic valve area, by VTI measures  1.88 cm. 86mm Edwards Sapien bioprosthetic, stented aortic valve (TAVR) valve is present in the aortic position. Procedure Date: 12/30/17.  _______________    CT chest WO contrast 02/11/19 IMPRESSION: 1. Similar bilateral pulmonary nodules, consistent with a benign etiology. 2. Aortic atherosclerosis (ICD10-I70.0), coronary artery atherosclerosis and emphysema (ICD10-J43.9). 3. Bilateral nephrolithiasis.   Labs/Other Tests and Data Reviewed:    EKG:  No ECG reviewed.  Recent Labs: 11/26/2018: BUN 19; Creatinine, Ser 1.15; Hemoglobin 12.8; Platelets 186; Potassium 4.1; Sodium 137   Recent Lipid Panel Lab Results  Component Value Date/Time   CHOL 133 03/05/2018 11:35 AM   CHOL 234 (H) 06/17/2017 11:16 AM   TRIG 109 03/05/2018 11:35 AM   HDL 41 03/05/2018 11:35 AM   HDL 39 (L) 06/17/2017 11:16 AM   CHOLHDL 3.2 03/05/2018 11:35 AM   LDLCALC 70 03/05/2018 11:35 AM   LDLCALC 166 (H) 06/17/2017 11:16 AM    Wt Readings from Last 3 Encounters:  02/26/19 153 lb (69.4 kg)  12/01/18 157 lb 2 oz (71.3 kg)  11/26/18 157 lb 2 oz (71.3 kg)     Objective:    Vital Signs:  BP 135/85    Wt 153 lb (69.4 kg)    BMI 25.46 kg/m    ASSESSMENT & PLAN:    Severe AS s/p TAVR: doing well. Echo 02/17/19 showed EF 55-60%, mod MR, normally functioning TAVR with mean gradient 45mm Hg and no PVL. She is doing well with NYHA class I symptoms. Continue baby aspirin 81mg  indefinitely. She has amoxicillin for SBE prophylaxis. Continue regular follow up with Dr. Ellyn Hack.    HTN: BP 135/85 today. No changes made.    Chronic diastolic CHF: no s/s CHF.    COPD: stable.    Pulmonary  nodules: multiple small pulmonary nodules noted throughout the lungs bilaterally, largest of which measures 5 mm in the right lower lobe.seen on pre op TAVR scans. She has been followed for a right pulmonary nodule in the past that apparently resolved on its own. Repeat CT on 02/11/19 showed stable similar bilateral pulmonary nodules c/w a benign etiology.   COVID-19 Education: The signs and symptoms of COVID-19 were discussed with the patient and how to seek care for testing (follow up with PCP or arrange E-visit).  The importance of social distancing was discussed today.  Time:   Today, I have spent 15 minutes with the patient with telehealth technology discussing the above problems.     Medication Adjustments/Labs and Tests Ordered: Current medicines are reviewed at length with the patient today.  Concerns regarding medicines are outlined above.   Tests Ordered: No orders of the defined types were placed in this encounter.   Medication Changes: Meds ordered this encounter  Medications   amoxicillin (AMOXIL) 500 MG tablet    Sig: Take 2,000 mg (four tablets) one hour prior to dental visits.    Dispense:  8 tablet    Refill:  3    Order Specific Question:   Supervising Provider    Answer:   Burt Knack, MICHAEL [id number]    Disposition:  Follow up prn  Signed, Angelena Form, PA-C  02/26/2019 11:47 AM    [place]

## 2019-02-26 ENCOUNTER — Telehealth (INDEPENDENT_AMBULATORY_CARE_PROVIDER_SITE_OTHER): Payer: Medicare Other | Admitting: Physician Assistant

## 2019-02-26 ENCOUNTER — Other Ambulatory Visit: Payer: Self-pay

## 2019-02-26 VITALS — BP 135/85 | Wt 153.0 lb

## 2019-02-26 DIAGNOSIS — J449 Chronic obstructive pulmonary disease, unspecified: Secondary | ICD-10-CM

## 2019-02-26 DIAGNOSIS — R911 Solitary pulmonary nodule: Secondary | ICD-10-CM

## 2019-02-26 DIAGNOSIS — Z952 Presence of prosthetic heart valve: Secondary | ICD-10-CM

## 2019-02-26 DIAGNOSIS — I5032 Chronic diastolic (congestive) heart failure: Secondary | ICD-10-CM

## 2019-02-26 DIAGNOSIS — I10 Essential (primary) hypertension: Secondary | ICD-10-CM

## 2019-02-26 MED ORDER — AMOXICILLIN 500 MG PO TABS: ORAL_TABLET | ORAL | 3 refills | Status: DC

## 2019-02-26 NOTE — Progress Notes (Signed)
Thank you very much\  Glenetta Hew, MD

## 2019-02-26 NOTE — Patient Instructions (Signed)
Continue on your current medications. A refill for amoxicillin has been called in. Please continue regular follow up with Dr. Ellyn Hack.

## 2019-04-21 ENCOUNTER — Other Ambulatory Visit: Payer: Self-pay | Admitting: Physician Assistant

## 2019-07-05 ENCOUNTER — Other Ambulatory Visit: Payer: Self-pay

## 2019-07-05 ENCOUNTER — Ambulatory Visit (INDEPENDENT_AMBULATORY_CARE_PROVIDER_SITE_OTHER): Payer: Medicare Other | Admitting: Cardiology

## 2019-07-05 ENCOUNTER — Encounter: Payer: Self-pay | Admitting: Cardiology

## 2019-07-05 VITALS — BP 140/88 | HR 85 | Temp 97.7°F | Ht 66.0 in | Wt 154.4 lb

## 2019-07-05 DIAGNOSIS — I471 Supraventricular tachycardia: Secondary | ICD-10-CM | POA: Diagnosis not present

## 2019-07-05 DIAGNOSIS — R5382 Chronic fatigue, unspecified: Secondary | ICD-10-CM

## 2019-07-05 DIAGNOSIS — I10 Essential (primary) hypertension: Secondary | ICD-10-CM | POA: Diagnosis not present

## 2019-07-05 DIAGNOSIS — I2510 Atherosclerotic heart disease of native coronary artery without angina pectoris: Secondary | ICD-10-CM | POA: Diagnosis not present

## 2019-07-05 DIAGNOSIS — I5032 Chronic diastolic (congestive) heart failure: Secondary | ICD-10-CM

## 2019-07-05 DIAGNOSIS — Z952 Presence of prosthetic heart valve: Secondary | ICD-10-CM | POA: Diagnosis not present

## 2019-07-05 DIAGNOSIS — E782 Mixed hyperlipidemia: Secondary | ICD-10-CM

## 2019-07-05 DIAGNOSIS — F17200 Nicotine dependence, unspecified, uncomplicated: Secondary | ICD-10-CM

## 2019-07-05 MED ORDER — HYDROCHLOROTHIAZIDE 25 MG PO TABS: 25.0000 mg | ORAL_TABLET | Freq: Every day | ORAL | 3 refills | Status: DC

## 2019-07-05 NOTE — Progress Notes (Signed)
Primary Care Provider: [facility] Cardiologist: Glenetta Hew, MD  Heart Failure/Pulmonary Hypertension: Dr. Aundra Dubin TAVR: Dr. Angelena Form Electrophysiologist: None  Clinic Note: Chief Complaint  Patient presents with  . Follow-up    Annual  . Cardiac Valve Problem    Status post TAVR  . Congestive Heart Failure    Chronic HFpEF with pulmonary hypertension    HPI:    Alexandra [name] is a 69 y.o. female with a PMH notable for severe COPD and aortic stenosis s/p TAVR with chronic HFpEF and pulmonary hypertension below who presents today for essentially annual follow-up   12/30/2017: TAVR (23 mm Edwards sapient 3THV via TF approach.  Alexandra [name] was last seen by me in Feb 2020.  Doing fairly well.  I chose not to make any changes since there was any follow-up visit with Dr. Aundra Dubin; consider reasonable risk for surgery.  Plan was just Plavix as of April 2020  Seen in follow-up June 04, 2018 by Dr. Aundra Dubin.  Noted that the patient was down to 102 cigarettes a day after previous heavy history.  Severely decreased DLCO by PFTs in April 2019 but only mild obstructive disease. -->  She denied any significant shortness of breath.  Mild ankle edema on increased dose of amlodipine.  No orthopnea, PND, chest pain or dizziness.  (Had run out of bisoprolol-hctz) --> Noted advised to CHF symptoms, improved since TAVR.  Suspect his COPD plays role in dyspnea.  Smoking cessation counseling given.  No treatment with pulmonary vasodilators.  Recent Hospitalizations:   L TKA 12/11/2018  She had a telemedicine follow-up visit with the TAVR clinic -[place], [place].  Noted that she was still little bit unsteady in her gait post surgery.  Echo showed stable aortic valve.  (Was concerned because her husband was in the hospital)  Reiterated need for SBE prophylaxis (oxacillin).  Reviewed  CV studies:    The following studies were reviewed today: (if available,  images/films reviewed: From Epic Chart or Care Everywhere) . (February 17, 2019) TTE: EF 55 to 60%.  Elevated LVEDP.  GR 1 DD.  No R WMA.  Moderate LA dilation.  Well-seated stented bioprosthetic aortic valve.  Mean gradient 13 mercury.  Moderate MAC moderate MR.  No evidence of mildly elevated RAP/CVP and moderately elevated PA P . (February 11, 2019) CT Chest without Contrast: Aortic and coronary atherosclerosis.  Emphysema.  Similar, likely benign bilateral pulmonary nodules.  Bilateral nephrolithiasis.  Interval History:   Alexandra [name] is here today for follow-up overall pretty stable from a cardiac standpoint.  She is really having a hard time recovering from her surgery.  Apparently about a week ago she fell and injured her other leg where she has had a history of right sided tib-fib fracture..  Both legs are pretty weak and she is a hard time with balance.  As a result, she is not really doing much in the way of any activity for exercise.  She has been noting off-and-on ankle swelling that is probably more related to her injury than actual edema.  She occasionally notes having episodes of increased heart rate oftentimes occurs in the morning.  It is not prolonged, not associated with lightheadedness or dizziness.  Just occasional bursts.  No chest pain or pressure with rest or exertion.  She does have baseline dyspnea that is worse with exertion.  She notes in general poor energy levels, but has not been very active over  the last 8 months or so.  Cardiovascular Review of Symptoms (Summary): positive for - dyspnea on exertion, edema, rapid heart rate and shortness of breath negative for - chest pain, irregular heartbeat, orthopnea, paroxysmal nocturnal dyspnea or Syncope/near syncope, TIA/amaurosis fugax, claudication  --> Still smoking about 2 to 4 cigarettes a day.  Does not seem to be all that interested in stopping.  The patient does not have symptoms concerning for COVID-19 infection  (fever, chills, cough, or new shortness of breath).  The patient is practicing social distancing & Masking.  --> She has been holding off going to get her Covid vaccine mostly because she is having to deal with all of her knee issues and balance etc.  Once that starts feeling better, she will probably schedule an appointment.   REVIEWED OF SYSTEMS   Review of Systems  Constitutional: Positive for malaise/fatigue (Low energy level.). Negative for weight loss.  HENT: Negative for congestion and nosebleeds.   Respiratory: Positive for cough and shortness of breath. Negative for sputum production.   Cardiovascular: Positive for leg swelling.  Gastrointestinal: Negative for abdominal pain, blood in stool and melena.  Genitourinary: Negative for hematuria.  Musculoskeletal: Positive for falls (Last week) and joint pain (Now both knees hurt).  Neurological: Positive for dizziness (Sometimes when she stands up fast). Negative for focal weakness and weakness.  Psychiatric/Behavioral: Negative for memory loss. The patient has insomnia. The patient is not nervous/anxious.    I have reviewed and (if needed) personally updated the patient's problem list, medications, allergies, past medical and surgical history, social and family history.   PAST MEDICAL HISTORY   Past Medical History:  Diagnosis Date  . Arthritis   . COPD (chronic obstructive pulmonary disease) ([place]) 05/22/2016  . Coronary artery disease, non-occlusive 11/28/2016   R&LHC: 65% distal LAD lesion -- likely not angiographically significant->medical management. Normal LVEF. Moderately elevated LVEDP. I aortic valve gradient in the Cath Lab, moderate aortic stenosis. This would suggest that the stenosis is on the moderate side of moderate to severe.  Severe pulmonary hypertension (likely mixed).  AVA 0.96 cm; P-P gradient ~ 30 mmHg, Mean ~24.5-27.5 (Moderate AS)   . GERD (gastroesophageal reflux disease)   . Heart murmur   . Hx of  adenomatous colonic polyps 10/19/2015  . Hyperlipidemia   . Hypertension   . Moderate mitral regurgitation by prior echocardiography 10/2016   Echocardiogram revealed moderate mitral regurgitation; most recent Echo 02/12/2019- MAC with moderate MR  . Pulmonary hypertension associated Aortic Stenosis, Mitral Regurgitation & COPD    Peak PAP by Echo ~54 mmHg;  by Cath PAP/Mean 71/32 mmHg, 53 mmHg (with PCWP & LVEDP ~24-26 mmHg); TPG ~26 mmHg  . Pulmonary nodules   . S/P TAVR (transcatheter aortic valve replacement) 12/30/2017   Edward's Sapien 3 bioprosthetic THB via TF approach  . Severe aortic stenosis 2018   Underwent TAVR in 2019    PAST SURGICAL HISTORY   Past Surgical History:  Procedure Laterality Date  . APPENDECTOMY    . BREAST CYST EXCISION Right 1970  . CARDIAC VALVE REPLACEMENT    . CATARACT EXTRACTION W/ INTRAOCULAR LENS IMPLANT Right   . CHOLECYSTECTOMY OPEN    . COLONOSCOPY  2003, 2017  . CTA Chest  09/2016   Thoracic Aortic Ca2+ w/o dilation.  Coronary Calcification noted. PA normal. Mild Emphysematous changes w/ mild LLL scarring.    [name] FRACTURE SURGERY     2007 -right tib /fib fracture ----07/2009 right femur fx after a fall  .  FRACTURE SURGERY Right 2011   Femur  . PERIPHERAL VASCULAR CATHETERIZATION N/A 08/09/2014   Procedure: Aortic Arch Angiography;  Surgeon: Serafina Mitchell, MD;  Location: [facility];  Service: Cardiovascular: Type 1 Arch. No significant stenosis, aneurysmal degeneration or dissection.  No luminal irregularity seen in R or L SubClavian, Brachial or Radial A. Chronic distal ulnar A occlusion [place].  [name] PERIPHERAL VASCULAR CATHETERIZATION Right 08/09/2014   Procedure: Upper Extremity Angiography;  Surgeon: Serafina Mitchell, MD;  Location: [facility];  Service: Cardiovascular;  Laterality: Right;  . RIGHT/LEFT HEART CATH AND CORONARY ANGIOGRAPHY N/A 12/06/2016   Procedure: RIGHT/LEFT HEART CATH AND CORONARY ANGIOGRAPHY;  Surgeon:  Leonie Man, MD;  [facility]: [facility]: Cor Angio: 65% dLAD (Med Rx). AVA 0.96 cm; P-P gradient ~ 30 mmHg, Mean ~24.5-27.5 (Mod AS); RHC #s: RAP 8 mmHg, RVP/EDP: 71/8/15 mmHg, PCWP: 21-24 mmHg, PAP/mean: 71/32/52 mmHg = Severe Mixed Pulmonary HTN; LVP/EDP 205/17/26 mmHg ; CO/CI by Fick: 4.35, 2.44 - mildly reduced  . RIGHT/LEFT HEART CATH AND CORONARY ANGIOGRAPHY N/A 11/20/2017   Procedure: RIGHT/LEFT HEART CATH AND CORONARY ANGIOGRAPHY;  Surgeon: Larey Dresser, MD;  Location: [facility];  Service: Cardiovascular;  Laterality: N/A;  . TEE WITHOUT CARDIOVERSION N/A 12/30/2017   Procedure: (Intra-Op) TRANSESOPHAGEAL ECHOCARDIOGRAM (TEE);  Surgeon: Burnell Blanks, MD;  Location: [facility];  Service: Open Heart Surgery; Post TAVR good position of 23 mm AP and 3 valve.  No new or WMA.  EF 55-60%.  No effusion.  Mild perivalvular regurgitation seen.  Mean gradient 7 mmHg.  Peak 16 mmHg.  [name] TOTAL KNEE ARTHROPLASTY Left 12/01/2018   Procedure: Left Knee Arthroplasty;  Surgeon: Melrose Nakayama, MD;  Location: [facility];  Service: Orthopedics;  Laterality: Left;  . TRANSCATHETER AORTIC VALVE REPLACEMENT, TRANSFEMORAL  12/30/2017  . TRANSCATHETER AORTIC VALVE REPLACEMENT, TRANSFEMORAL N/A 12/30/2017   Procedure: TRANSCATHETER AORTIC VALVE REPLACEMENT, TRANSFEMORAL;  Surgeon: Burnell Blanks, MD;  Location: [facility];  Service: Open Heart Surgery;  Laterality: N/A;  . TRANSTHORACIC ECHOCARDIOGRAM  10/30/2016   Mild Concentric LVH. EF 55-60%. No RWMA, Gr 2 DD. Mod-Severe AS (mean-peak Gradient 31 mmHG - 64 mmHg), Mod MR. Mod LA dilation, Mod PA HTN - peak pressure ~54 mmHg). = Progression of AS from 2014  . TRANSTHORACIC ECHOCARDIOGRAM  01/2019   EF 55 to 60%.  Elevated LVEDP.  GR 1 DD.  No R WMA.  Moderate LA dilation.  Well-seated stented bioprosthetic aortic valve.  Mean gradient 13 mercury.  Moderate MAC moderate MR.  No evidence of mildly elevated RAP/CVP and moderately elevated PA P    . TRANSTHORACIC ECHOCARDIOGRAM  10/2017   a) Pre-TAVR 10/31/17: EF 55 to 60%.  GR 1 DD.  Calcific severe AS.  Peak Grad 62 mmHg, mean 32 mmHg.  Progression of Dz.  AVA ~0.8 cm.; b) Post TAVR 10/9/'19: EF 60-65%.  GR 1 DD.  Stable appearing 23 mm Edwards's Sapien AV bioprosthesis.  No perivalvular AI.  Mean Grad 22 mmHg; c) 11/13/'19: AoV mean Grad 19 mmHg,, Peak 36 mmHg. PAP ~33 mmHg  . ULTRASOUND GUIDANCE FOR VASCULAR ACCESS  08/09/2014   Procedure: Ultrasound Guidance For Vascular Access;  Surgeon: Serafina Mitchell, MD;  Location: [facility];  Service: Cardiovascular;;    Pre-TAVR Echo October 31, 2017: EF 55 to 60%.  GR 1 DD.  Calcific severe aortic stenosis.  Peak gradient 62 million mercury, mean 32 mmHg.  Progression of disease.  AVA estimated 0.8 cm.  Pre-TAVR R&LHC: Low filling pressures with mild pulmonary pretension (status post diuresis) suspect group 3 PAH due to COPD.  PVR only 3.78 [place] units.  Nonobstructive CAD.  Valve not crossed.  Plan to proceed with TAVR.  Pre-/post TAVR TEE 12/30/2017: Post TAVR good position of 23 mm AP and 3 valve.  No new or WMA.  EF 55-60%.  No effusion.  Mild perivalvular regurgitation seen.  Mean gradient 7 mmHg.  Peak 16 mmHg  Echo 12/31/2017: EF 60-65%.  GR 1 DD.  Stable appearing 23 mm Edwards's Sapien AV bioprosthesis.  No perivalvular AI.  Mean gradient 22 mmHg.  Echo 02/04/2018: Ef 55-60%. Gr1 DD. AoV mean gradient 19 mmHg, Peak 36 mmHg. PAP ~33 mmHg   MEDICATIONS/ALLERGIES   Current Meds  Medication Sig  . amLODipine (NORVASC) 10 MG tablet Take by mouth.  [name] aspirin 81 MG tablet Take 1 tablet (81 mg total) by mouth 2 (two) times daily at 10 AM and 5 PM.  . Azelastine HCl 0.15 % SOLN two sprays by Both Nostrils route 2 (two) times daily.  . bisoprolol-hydrochlorothiazide (ZIAC) 10-6.25 MG tablet Take 1 tablet by mouth daily.  . diclofenac Sodium (VOLTAREN) 1 % GEL APP 4 GRAMS EXT AA UP TO TID PRN  . ergocalciferol (VITAMIN D2) 1.25 MG  (50000 UT) capsule Take by mouth.  . fluticasone (FLOVENT HFA) 220 MCG/ACT inhaler Inhale into the lungs.  [name] losartan (COZAAR) 100 MG tablet Take by mouth.  . oxybutynin (DITROPAN-XL) 10 MG 24 hr tablet Take by mouth.  . potassium chloride SA (KLOR-CON) 20 MEQ tablet Take 1 tablet (20 mEq total) by mouth daily.  . rosuvastatin (CRESTOR) 10 MG tablet Take by mouth.  [name] tiZANidine (ZANAFLEX) 4 MG tablet Take 1 tablet (4 mg total) by mouth every 6 (six) hours as needed.    Allergies  Allergen Reactions  . Codeine Nausea Only  . Symproic [Naldemedine] Nausea And Vomiting    SOCIAL HISTORY/FAMILY HISTORY   Reviewed in Epic:  Pertinent findings: Lives with her husband.  Retired.  Down to 1-2 cigarettes a day.  OBJCTIVE -PE, EKG, labs   Wt Readings from Last 3 Encounters:  07/05/19 154 lb 6.4 oz (70 kg)  02/26/19 153 lb (69.4 kg)  12/01/18 157 lb 2 oz (71.3 kg)    Physical Exam: BP 140/88   Pulse 85   Temp 97.7 F (36.5 C)   Ht _0  (1.676 m)   Wt 154 lb 6.4 oz (70 kg)   SpO2 97%   BMI 24.92 kg/m  Physical Exam  Constitutional: She is oriented to person, place, and time. She appears well-developed and well-nourished. No distress.  Chronic ill-appearing elderly woman.  Well-groomed.  Has put on back some weight that she had lost.  HENT:  Head: Normocephalic and atraumatic.  Neck: No hepatojugular reflux and no JVD present. Carotid bruit is not present (Radiated aortic murmur).  Cardiovascular: Normal rate, regular rhythm, S1 normal, S2 normal and intact distal pulses.  No extrasystoles are present. PMI is not displaced. Exam reveals distant heart sounds. Exam reveals no gallop and no friction rub.  Murmur (2/6 SEM at RUSB--neck.) heard. Pulmonary/Chest: Effort normal. No respiratory distress. She exhibits no tenderness.  Mild diffuse coarse interstitial sounds with occasional expiratory wheezing.  No rales or rhonchi.  Abdominal: Soft. Bowel sounds are normal. She exhibits no  distension. There is no abdominal tenderness.  Musculoskeletal:        General: Edema (Trivial  1+ ankle swelling.) present. Normal range of motion.     Cervical back: Normal range of motion and neck supple.  Neurological: She is alert and oriented to person, place, and time.  Psychiatric: She has a normal mood and affect. Her behavior is normal. Judgment and thought content normal.  Vitals reviewed.    Adult ECG Report  Rate: 85;  Rhythm: normal sinus rhythm and IVCD/LBBB pattern.  Cannot rule out septal MI, age-indeterminate.  T wave inversions in anterolateral leads less prominent.;   Narrative Interpretation: Stable EKG.  Recent Labs:   Lab Results  Component Value Date   CHOL 133 03/05/2018   HDL 41 03/05/2018   LDLCALC 70 03/05/2018   TRIG 109 03/05/2018   CHOLHDL 3.2 03/05/2018   Lab Results  Component Value Date   CREATININE 1.15 (H) 11/26/2018   BUN 19 11/26/2018   NA 137 11/26/2018   K 4.1 11/26/2018   CL 102 11/26/2018   CO2 25 11/26/2018   Lab Results  Component Value Date   TSH 1.620 01/14/2018    ASSESSMENT/PLAN    Problem List Items Addressed This Visit    Essential hypertension (Chronic)    On max dose amlodipine along with losartan.  On bisoprolol-HCTZ.  Blood pressure little high today and significant edema.  Will bump up HCTZ to add 25 mg tab  - recheck chemistry in 2 weeks.      Relevant Medications   amLODipine (NORVASC) 10 MG tablet   losartan (COZAAR) 100 MG tablet   rosuvastatin (CRESTOR) 10 MG tablet   hydrochlorothiazide (HYDRODIURIL) 25 MG tablet   Chronic diastolic (congestive) heart failure (HCC) - Primary (Chronic)    Not overly symptomatic.  Is on losartan at max dose along with bisoprolol-HCTZ.  No longer on Lasix.  We will actually start her on HCTZ 25 mg daily (could probably convert her bisoprolol-HCTZ dose to 10/25 if she tolerates)  -> BNP checked today 279, but seems relatively euvolemic.--> may need to go back to doing  intermittent dose of Lasix but will see how she is doing after being on higher dose HCTZ.      Relevant Medications   amLODipine (NORVASC) 10 MG tablet   losartan (COZAAR) 100 MG tablet   rosuvastatin (CRESTOR) 10 MG tablet   hydrochlorothiazide (HYDRODIURIL) 25 MG tablet   Other Relevant Orders   EKG 12-Lead (Completed)   Brain natriuretic peptide (Completed)   Comprehensive metabolic panel   Smoker (Chronic)    Encourage smoking cessation, however I do not think she is at this stage of being interested yet.      Coronary artery disease, non-occlusive (Chronic)    No angina symptoms.  Moderate CAD.  Plan again document smoking cessation. Is back on statin now-needs labs to be checked. Continue beta-blocker and ARB.      Relevant Medications   amLODipine (NORVASC) 10 MG tablet   losartan (COZAAR) 100 MG tablet   rosuvastatin (CRESTOR) 10 MG tablet   hydrochlorothiazide (HYDRODIURIL) 25 MG tablet   Other Relevant Orders   EKG 12-Lead (Completed)   Brain natriuretic peptide (Completed)   PAT (paroxysmal atrial tachycardia) (HCC) (Chronic)    Had been on diltiazem, now switched to amlodipine.  But otherwise doing relatively well with bisoprolol at current dose.      Relevant Medications   amLODipine (NORVASC) 10 MG tablet   losartan (COZAAR) 100 MG tablet   rosuvastatin (CRESTOR) 10 MG tablet   hydrochlorothiazide (HYDRODIURIL) 25 MG tablet   Other  Relevant Orders   EKG 12-Lead (Completed)   Chronic fatigue (Chronic)    I think essentially at this point I think she is totally deconditioned, especially because of her musculoskeletal issues.      Hyperlipidemia (Chronic)    Check lipids in [redacted] weeks along with chemistry levels to reassess after being on HCTZ. Continue current dose of rosuvastatin pending results.      Relevant Medications   amLODipine (NORVASC) 10 MG tablet   losartan (COZAAR) 100 MG tablet   rosuvastatin (CRESTOR) 10 MG tablet   hydrochlorothiazide  (HYDRODIURIL) 25 MG tablet   Other Relevant Orders   Lipid panel   S/P TAVR (transcatheter aortic valve replacement) (Chronic)    Post TAVR echo showed stable EF.  Mild stenosis by gradient.  Valve working well.      Relevant Orders   EKG 12-Lead (Completed)   Brain natriuretic peptide (Completed)   Comprehensive metabolic panel       [id number] Education: The signs and symptoms of COVID-19 were discussed with the patient and how to seek care for testing (follow up with PCP or arrange E-visit).   The importance of social distancing was discussed today.  I spent a total of 5mnutes with the patient. >  50% of the time was spent in direct patient consultation.  Additional time spent with chart review  / charting (studies, outside notes, etc): 9 Total Time: 31 min   Current medicines are reviewed at length with the patient today.  (+/- concerns) none  Notice: This dictation was prepared with Dragon dictation along with smaller phrase technology. Any transcriptional errors that result from this process are unintentional and may not be corrected upon review.  Patient Instructions / Medication Changes & Studies & Tests Ordered   Patient Instructions  Medication Instructions:   START HYDROCHLOROTHIAZIDE 25 MG ONE TABLET DAILY  CONTINUE ALL OTHER MEDICATIONS   *If you need a refill on your cardiac medications before your next appointment, please call your pharmacy*   Lab Work: BNP - TODAY  IN 2 WEEKS -- CMP ,LIPID -- FASTING  WEEK OF April 26 ,2021 If you have labs (blood work) drawn today and your tests are completely normal, you will receive your results only by: .Marland KitchenMyChart Message (if you have MyChart) OR . A paper copy in the mail If you have any lab test that is abnormal or we need to change your treatment, we will call you to review the results.   Testing/Procedures: NOT NEEDED   Follow-Up: [facility] you and your health needs are our priority.  As part of  our continuing mission to provide you with exceptional heart care, we have created designated Provider Care Teams.  These Care Teams include your primary Cardiologist (physician) and Advanced Practice Providers (APPs -  Physician Assistants and Nurse Practitioners) who all work together to provide you with the care you need, when you need it.  We recommend signing up for the patient portal called "MyChart".  Sign up information is provided on this After Visit Summary.  MyChart is used to connect with patients for Virtual Visits (Telemedicine).  Patients are able to view lab/test results, encounter notes, upcoming appointments, etc.  Non-urgent messages can be sent to your provider as well.   To learn more about what you can do with MyChart, go to [email or web address]    Your next appointment:   3 TO 4 month(s)  The format for your next appointment:   In Person  Provider:  Glenetta Hew, MD    Other Instructions  KEEP AN EYE OUT FOR PALPATIONS GET WORSE CONTACT OFFICE.    Studies Ordered:   Orders Placed This Encounter  Procedures  . Brain natriuretic peptide  . Lipid panel  . Comprehensive metabolic panel  . EKG 12-Lead     Glenetta Hew, M.D., M.S. Interventional Cardiologist   Pager # [phone] Phone # [phone] [address] [place], [place] [address]   Thank you for choosing [facility] at [facility]!!

## 2019-07-05 NOTE — Patient Instructions (Signed)
Medication Instructions:   START HYDROCHLOROTHIAZIDE 25 MG ONE TABLET DAILY  CONTINUE ALL OTHER MEDICATIONS   *If you need a refill on your cardiac medications before your next appointment, please call your pharmacy*   Lab Work: BNP - TODAY  IN 2 WEEKS -- CMP ,LIPID -- FASTING  WEEK OF April 26 ,2021 If you have labs (blood work) drawn today and your tests are completely normal, you will receive your results only by: [name] MyChart Message (if you have MyChart) OR . A paper copy in the mail If you have any lab test that is abnormal or we need to change your treatment, we will call you to review the results.   Testing/Procedures: NOT NEEDED   Follow-Up: At [facility], you and your health needs are our priority.  As part of our continuing mission to provide you with exceptional heart care, we have created designated Provider Care Teams.  These Care Teams include your primary Cardiologist (physician) and Advanced Practice Providers (APPs -  Physician Assistants and Nurse Practitioners) who all work together to provide you with the care you need, when you need it.  We recommend signing up for the patient portal called "MyChart".  Sign up information is provided on this After Visit Summary.  MyChart is used to connect with patients for Virtual Visits (Telemedicine).  Patients are able to view lab/test results, encounter notes, upcoming appointments, etc.  Non-urgent messages can be sent to your provider as well.   To learn more about what you can do with MyChart, go to [email or web address].    Your next appointment:   3 TO 4 month(s)  The format for your next appointment:   In Person  Provider:    Glenetta Hew, MD    Other Instructions  KEEP AN EYE OUT FOR PALPATIONS GET [place].

## 2019-07-06 LAB — BRAIN NATRIURETIC PEPTIDE: BNP: 279.9 pg/mL — ABNORMAL HIGH (ref 0.0–100.0)

## 2019-07-07 ENCOUNTER — Encounter: Payer: Self-pay | Admitting: Cardiology

## 2019-07-07 NOTE — Assessment & Plan Note (Signed)
Post TAVR echo showed stable EF.  Mild stenosis by gradient.  Valve working well.

## 2019-07-07 NOTE — Assessment & Plan Note (Addendum)
Not overly symptomatic.  Is on losartan at max dose along with bisoprolol-HCTZ.  No longer on Lasix.  We will actually start her on HCTZ 25 mg daily (could probably convert her bisoprolol-HCTZ dose to 10/25 if she tolerates)  -> BNP checked today 279, but seems relatively euvolemic.--> may need to go back to doing intermittent dose of Lasix but will see how she is doing after being on higher dose HCTZ.

## 2019-07-07 NOTE — Assessment & Plan Note (Signed)
Encourage smoking cessation, however I do not think she is at this stage of being interested yet.

## 2019-07-07 NOTE — Assessment & Plan Note (Signed)
Check lipids in [redacted] weeks along with chemistry levels to reassess after being on HCTZ. Continue current dose of rosuvastatin pending results.

## 2019-07-07 NOTE — Assessment & Plan Note (Signed)
Had been on diltiazem, now switched to amlodipine.  But otherwise doing relatively well with bisoprolol at current dose.

## 2019-07-07 NOTE — Assessment & Plan Note (Signed)
I think essentially at this point I think she is totally deconditioned, especially because of her musculoskeletal issues.

## 2019-07-07 NOTE — Assessment & Plan Note (Signed)
On max dose amlodipine along with losartan.  On bisoprolol-HCTZ.  Blood pressure little high today and significant edema.  Will bump up HCTZ to add 25 mg tab  - recheck chemistry in 2 weeks.

## 2019-07-07 NOTE — Assessment & Plan Note (Signed)
No angina symptoms.  Moderate CAD.  Plan again document smoking cessation. Is back on statin now-needs labs to be checked. Continue beta-blocker and ARB.

## 2019-07-08 ENCOUNTER — Telehealth: Payer: Self-pay | Admitting: *Deleted

## 2019-07-08 DIAGNOSIS — I5032 Chronic diastolic (congestive) heart failure: Secondary | ICD-10-CM

## 2019-07-08 DIAGNOSIS — R0609 Other forms of dyspnea: Secondary | ICD-10-CM

## 2019-07-08 NOTE — Telephone Encounter (Signed)
-----   Message from Leonie Man, MD sent at 07/07/2019 11:52 PM EDT ----- BNP level is up a little bit -(suggests that she may be holding onto little fluid)> however she seems to be relatively euvolemic..  We have just started HCTZ.  She is due to come back in 2 weeks to reassess with fasting lipid panel and chemistry panel.  Add BNP to that level as well.

## 2019-07-08 NOTE — Telephone Encounter (Signed)
Left detailed message about labwork  on voicemail .  BNP ordered for 07/19/19. Any question may call back

## 2019-07-09 ENCOUNTER — Other Ambulatory Visit: Payer: Self-pay | Admitting: *Deleted

## 2019-07-09 DIAGNOSIS — I5032 Chronic diastolic (congestive) heart failure: Secondary | ICD-10-CM

## 2019-07-09 DIAGNOSIS — R0609 Other forms of dyspnea: Secondary | ICD-10-CM

## 2019-07-23 LAB — LIPID PANEL
Chol/HDL Ratio: 3 ratio (ref 0.0–4.4)
Cholesterol, Total: 137 mg/dL (ref 100–199)
HDL: 46 mg/dL (ref >39)
LDL Chol Calc (NIH): 72 mg/dL (ref 0–99)
Triglycerides: 104 mg/dL (ref 0–149)
VLDL Cholesterol Cal: 19 mg/dL (ref 5–40)

## 2019-07-23 LAB — COMPREHENSIVE METABOLIC PANEL
ALT: 7 IU/L (ref 0–32)
AST: 11 IU/L (ref 0–40)
Albumin/Globulin Ratio: 1.8 (ref 1.2–2.2)
Albumin: 4.1 g/dL (ref 3.8–4.8)
Alkaline Phosphatase: 125 IU/L — ABNORMAL HIGH (ref 39–117)
BUN/Creatinine Ratio: 21 (ref 12–28)
BUN: 29 mg/dL — ABNORMAL HIGH (ref 8–27)
Bilirubin Total: <0.2 mg/dL (ref 0.0–1.2)
CO2: 23 mmol/L (ref 20–29)
Calcium: 9.1 mg/dL (ref 8.7–10.3)
Chloride: 103 mmol/L (ref 96–106)
Creatinine, Ser: 1.4 mg/dL — ABNORMAL HIGH (ref 0.57–1.00)
GFR calc Af Amer: 44 mL/min/{1.73_m2} — ABNORMAL LOW (ref >59)
GFR calc non Af Amer: 38 mL/min/{1.73_m2} — ABNORMAL LOW (ref >59)
Globulin, Total: 2.3 g/dL (ref 1.5–4.5)
Glucose: 87 mg/dL (ref 65–99)
Potassium: 4.4 mmol/L (ref 3.5–5.2)
Sodium: 140 mmol/L (ref 134–144)
Total Protein: 6.4 g/dL (ref 6.0–8.5)

## 2019-07-28 ENCOUNTER — Telehealth: Payer: Self-pay | Admitting: *Deleted

## 2019-07-28 MED ORDER — BISOPROLOL-HYDROCHLOROTHIAZIDE 10-6.25 MG PO TABS: 2.0000 | ORAL_TABLET | Freq: Every day | ORAL | 6 refills | Status: DC

## 2019-07-28 NOTE — Telephone Encounter (Signed)
The patient has been notified of the result and verbalized understanding.  All questions (if any) were answered. Patient is aware to stop hctz 25 mg   ( she said her swelling had stop while using medication.  She is aware to take 2 tablets of bisoprolol/hctz 10/6.25 mg starting 3 days  from now per Dr Ellyn Hack. She will call if swelling returns. Raiford Simmonds, RN 07/28/2019 4:28 PM

## 2019-07-28 NOTE — Telephone Encounter (Signed)
Called patient is not at home . Left message with husband -please have patient to call back for information

## 2019-07-28 NOTE — Telephone Encounter (Addendum)
Left message to call back about results  ask to RN or triage nurses   ( Need clarification - patient has hctz 25 mg  Daily and  bisoprolol-hctz 10 /6.25mg  daily on medication list ) ? Both are needed.  RN Spoke  to Dr Ellyn Hack, -  clarification  patient is to stop  taking HCTZ 25 MG all together after 3 days increas Bisoprolol-Hctz 10 mg/6.25 mg to double the dose  20mg /12.5 mg daily     Per Dr Ellyn Hack -- will need stop taking Hctz 25 mg for and increase hydration see above- patient will have to take 2 tablets daily of Bisoprolol/hctz to equal (20 mg /12.5 mg)   medication des not come in th that tablet.

## 2019-07-28 NOTE — Telephone Encounter (Signed)
-----   Message from Leonie Man, MD sent at 07/27/2019  1:02 PM EDT ----- Chemistry panel shows that the kidney function is a little bit off.  She looks a little bit dry.  Lets hold HCTZ for 3 days, increase hydration.  Otherwise sodium/potassium etc. look fine.  Lipid panel looks great.  LDL is almost at goal at 72" is 137.  This is previous back to everywhere in May 2018.  Continue current cholesterol medications.  Glenetta Hew, MD

## 2019-08-03 ENCOUNTER — Other Ambulatory Visit: Payer: Self-pay

## 2019-08-03 ENCOUNTER — Telehealth: Payer: Self-pay | Admitting: Cardiology

## 2019-08-03 MED ORDER — BISOPROLOL-HYDROCHLOROTHIAZIDE 10-6.25 MG PO TABS: 2.0000 | ORAL_TABLET | Freq: Every day | ORAL | 6 refills | Status: DC

## 2019-08-03 NOTE — Telephone Encounter (Signed)
Refill for prescription sent

## 2019-08-03 NOTE — Telephone Encounter (Signed)
Pt c/o medication issue:  1. Name of Medication: bisoprolol-hydrochlorothiazide (ZIAC) 10-6.25 MG tablet  2. How are you currently taking this medication (dosage and times per day)? n/a  3. Are you having a reaction (difficulty breathing--STAT)? No  4. What is your medication issue? Patient states that Ivin Booty was to call in this medication and she was to have already started it. She wants to know if it has been sent to her pharmacy because she has not heard anything. Please advise.

## 2019-10-18 ENCOUNTER — Telehealth: Payer: Self-pay | Admitting: Cardiology

## 2019-10-18 NOTE — Telephone Encounter (Signed)
*  STAT* If patient is at the pharmacy, call can be transferred to refill team.   1. Which medications need to be refilled? (please list name of each medication and dose if known) potassium chloride SA (KLOR-CON) 20 MEQ tablet    2. Which pharmacy/location (including street and city if local pharmacy) is medication to be sent to? [facility] [phone] - [place], [place] - [address] AT [address]   3. Do they need a 30 day or 90 day supply? [place]

## 2019-10-19 MED ORDER — POTASSIUM CHLORIDE CRYS ER 20 MEQ PO TBCR: 20.0000 meq | EXTENDED_RELEASE_TABLET | Freq: Every day | ORAL | 3 refills | Status: DC

## 2019-11-11 ENCOUNTER — Encounter: Payer: Self-pay | Admitting: Cardiology

## 2019-11-11 ENCOUNTER — Ambulatory Visit (INDEPENDENT_AMBULATORY_CARE_PROVIDER_SITE_OTHER): Payer: Medicare Other | Admitting: Cardiology

## 2019-11-11 ENCOUNTER — Other Ambulatory Visit: Payer: Self-pay

## 2019-11-11 VITALS — BP 138/70 | HR 53 | Ht 66.0 in | Wt 153.0 lb

## 2019-11-11 DIAGNOSIS — I5032 Chronic diastolic (congestive) heart failure: Secondary | ICD-10-CM

## 2019-11-11 DIAGNOSIS — Z952 Presence of prosthetic heart valve: Secondary | ICD-10-CM

## 2019-11-11 DIAGNOSIS — I10 Essential (primary) hypertension: Secondary | ICD-10-CM

## 2019-11-11 DIAGNOSIS — I471 Supraventricular tachycardia: Secondary | ICD-10-CM

## 2019-11-11 DIAGNOSIS — I272 Pulmonary hypertension, unspecified: Secondary | ICD-10-CM

## 2019-11-11 DIAGNOSIS — R0609 Other forms of dyspnea: Secondary | ICD-10-CM

## 2019-11-11 DIAGNOSIS — I2510 Atherosclerotic heart disease of native coronary artery without angina pectoris: Secondary | ICD-10-CM

## 2019-11-11 DIAGNOSIS — R0600 Dyspnea, unspecified: Secondary | ICD-10-CM

## 2019-11-11 DIAGNOSIS — E782 Mixed hyperlipidemia: Secondary | ICD-10-CM

## 2019-11-11 MED ORDER — FUROSEMIDE 20 MG PO TABS: 20.0000 mg | ORAL_TABLET | ORAL | 6 refills | Status: DC | PRN

## 2019-11-11 NOTE — Patient Instructions (Addendum)
Medication Instructions:   Furosemide 20 mg may take as needed if you have some swelling noted *If you need a refill on your cardiac medications before your next appointment, please call your pharmacy*   Lab Work: BMP today  If you have labs (blood work) drawn today and your tests are completely normal, you will receive your results only by: [name] MyChart Message (if you have MyChart) OR . A paper copy in the mail If you have any lab test that is abnormal or we need to change your treatment, we will call you to review the results.   Testing/Procedures: Not needed   Follow-Up: At [facility], you and your health needs are our priority.  As part of our continuing mission to provide you with exceptional heart care, we have created designated Provider Care Teams.  These Care Teams include your primary Cardiologist (physician) and Advanced Practice Providers (APPs -  Physician Assistants and Nurse Practitioners) who all work together to provide you with the care you need, when you need it.  We recommend signing up for the patient portal called "MyChart".  Sign up information is provided on this After Visit Summary.  MyChart is used to connect with patients for Virtual Visits (Telemedicine).  Patients are able to view lab/test results, encounter notes, upcoming appointments, etc.  Non-urgent messages can be sent to your provider as well.   To learn more about what you can do with MyChart, go to [email or web address].    Your next appointment:   6 month(s)  The format for your next appointment:   In Person  Provider:   Glenetta Hew, MD   Other Instructions   Would like for you to purchase some support /compression socks  Medium weight  10- [phone]

## 2019-11-11 NOTE — Progress Notes (Signed)
Primary Care Provider: [facility] Cardiologist: Glenetta Hew, MD  Heart Failure/Pulmonary Hypertension: Dr. Aundra Dubin TAVR: Dr. Angelena Form Electrophysiologist: None  Clinic Note: Chief Complaint  Patient presents with  . Follow-up    50-month . Aortic Stenosis    Status post TAVR    HPI:    Alexandra FRESQUEZis a 69y.o. female with a PMH notable for severe COPD and aortic stenosis s/p TAVR with chronic HFpEF and pulmonary hypertension below who presents today for essentially annual follow-up   12/30/2017: TAVR (23 mm Edwards sapient 3THV via TF approach.  Alexandra SPAINHOWERwas last seen by me on July 05, 2019 -> she was pretty stable from cardiac standpoint.  Still having a hard time recovering from her left TKA surgery September 2020.  She apparently had a fall and injured her right leg where she had a tib-fib fracture shortly after initial surgery.  This may both her leg is quite weak.  Still having off-and-on swelling. -> She noted off-and-on episodes of increased heart rates oftentimes in the morning.  Not prolonged and not associated with any lightheadedness or dizziness.  Just bursts.  Otherwise no chest pain or pressure.  This noted energy level is just not back to where it had been. --> Still smoking about 2 to 4 cigarettes a day.  Not interested in fully quitting.  Reiterated need for SBE prophylaxis (oxacillin).  Increased HCTZ to 25 mg daily in order labs.  (Dementia this actually was stopped and converted to bisoprolol-HCTZ 10-6.25 mg at home discussed potential using as needed Lasix.  Confirmed that she was back on statin.  No change in fatigue with statin holiday.  Recent Hospitalizations:   None   Reviewed  CV studies:    The following studies were reviewed today: (if available, images/films reviewed: From Epic Chart or Care Everywhere) . No new studies  Interval History:   Alexandra KERCEis here today for follow-up pretty much  stable.  She is limited now by her knees and right leg and the pain.  See cannot do much walking because her stability is down.  Overall her energy level is still down but she feels okay.  She is not having any real symptoms of chest discomfort or dyspnea.  She does still have a little bit of swelling (which she contributes to her previous injuries) but it definitely was better with addition of HCTZ.  No real PND orthopnea.  The palpitation spells seem to be notably improved.  Cardiovascular Review of Symptoms (Summary): positive for - dyspnea on exertion, edema and shortness of breath negative for - chest pain, irregular heartbeat, orthopnea, palpitations, paroxysmal nocturnal dyspnea, rapid heart rate or She may have some lightheadedness but no syncope or near syncope.  No TIA or amaurosis fugax.  Not really walking enough to notice claudication.  --> No real changes to her smoking: Still smoking about 2 to 4 cigarettes a day.  Does not seem to be all that interested in stopping.  The patient DOES NOT have symptoms concerning for COVID-19 infection (fever, chills, cough, or new shortness of breath).  The patient is practicing social distancing & Masking.   Immunization History  Administered Date(s) Administered  . Fluad Quad(high Dose 65+) 12/02/2018  . PFIZER SARS-COV-2 Vaccination 07/08/2019, 07/29/2019  . Pneumococcal Conjugate-13 08/15/2015  . Pneumococcal Polysaccharide-23 12/24/2016  . Td 08/15/2015  . Zoster Recombinat (Shingrix) 07/31/2016, 11/17/2016    REVIEWED OF SYSTEMS   Review of  Systems  Constitutional: Positive for malaise/fatigue (Still complains of low energy levels.-Question of some of this is dysthymia.). Negative for weight loss.  HENT: Negative for congestion and nosebleeds.   Respiratory: Positive for cough (Chronic morning smoker's cough) and shortness of breath. Negative for sputum production (Occasionally some white mucus).   Cardiovascular: Positive for leg  swelling (Both knees and in the right lower leg where her tib-fib fracture was.).  Gastrointestinal: Negative for abdominal pain, blood in stool and melena.  Genitourinary: Negative for hematuria.  Musculoskeletal: Positive for joint pain (Now both knees hurt). Negative for falls (Last week).  Neurological: Positive for dizziness (If she stands up too fast). Negative for focal weakness and weakness.  Psychiatric/Behavioral: Negative for memory loss. The patient has insomnia. The patient is not nervous/anxious.    I have reviewed and (if needed) personally updated the patient's problem list, medications, allergies, past medical and surgical history, social and family history.   PAST MEDICAL HISTORY   Past Medical History:  Diagnosis Date  . Arthritis   . COPD (chronic obstructive pulmonary disease) ([place]) 05/22/2016   Severely decreased DLCO by PFTs in April 2019 but only mild obstructive disease.   . Coronary artery disease, non-occlusive 11/28/2016   R&LHC: 65% distal LAD lesion -- likely not angiographically significant->medical management. Normal LVEF. Moderately elevated LVEDP. I aortic valve gradient in the Cath Lab, moderate aortic stenosis. This would suggest that the stenosis is on the moderate side of moderate to severe.  Severe pulmonary hypertension (likely mixed).  AVA 0.96 cm; P-P gradient ~ 30 mmHg, Mean ~24.5-27.5 (Moderate AS)   . GERD (gastroesophageal reflux disease)   . Heart murmur   . Hx of adenomatous colonic polyps 10/19/2015  . Hyperlipidemia   . Hypertension   . Moderate mitral regurgitation by prior echocardiography 10/2016   Echocardiogram revealed moderate mitral regurgitation; most recent Echo 02/12/2019- MAC with moderate MR  . Pulmonary hypertension associated Aortic Stenosis, Mitral Regurgitation & COPD    Peak PAP by Echo ~54 mmHg;  by Cath PAP/Mean 71/32 mmHg, 53 mmHg (with PCWP & LVEDP ~24-26 mmHg); TPG ~26 mmHg  . Pulmonary nodules   . S/P TAVR  (transcatheter aortic valve replacement) 12/30/2017   Edward's Sapien 3 bioprosthetic THB via TF approach  . Severe aortic stenosis 2018   Underwent TAVR in 2019    PAST SURGICAL HISTORY   Past Surgical History:  Procedure Laterality Date  . APPENDECTOMY    . BREAST CYST EXCISION Right 1970  . CARDIAC VALVE REPLACEMENT    . CATARACT EXTRACTION W/ INTRAOCULAR LENS IMPLANT Right   . CHOLECYSTECTOMY OPEN    . COLONOSCOPY  2003, 2017  . CTA Chest  09/2016   Thoracic Aortic Ca2+ w/o dilation.  Coronary Calcification noted. PA normal. Mild Emphysematous changes w/ mild LLL scarring.    [name] FRACTURE SURGERY     2007 -right tib /fib fracture ----07/2009 right femur fx after a fall  . FRACTURE SURGERY Right 2011   Femur  . PERIPHERAL VASCULAR CATHETERIZATION N/A 08/09/2014   Procedure: Aortic Arch Angiography;  Surgeon: Serafina Mitchell, MD;  Location: [facility];  Service: Cardiovascular: Type 1 Arch. No significant stenosis, aneurysmal degeneration or dissection.  No luminal irregularity seen in R or L SubClavian, Brachial or Radial A. Chronic distal ulnar A occlusion [place].  [name] PERIPHERAL VASCULAR CATHETERIZATION Right 08/09/2014   Procedure: Upper Extremity Angiography;  Surgeon: Serafina Mitchell, MD;  Location: [facility];  Service: Cardiovascular;  Laterality: Right;  . RIGHT/LEFT HEART CATH AND CORONARY ANGIOGRAPHY N/A 12/06/2016   Procedure: RIGHT/LEFT HEART CATH AND CORONARY ANGIOGRAPHY;  Surgeon: Leonie Man, MD;  [facility]: [facility]: Cor Angio: 65% dLAD (Med Rx). AVA 0.96 cm; P-P gradient ~ 30 mmHg, Mean ~24.5-27.5 (Mod AS); RHC #s: RAP 8 mmHg, RVP/EDP: 71/8/15 mmHg, PCWP: 21-24 mmHg, PAP/mean: 71/32/52 mmHg = Severe Mixed Pulmonary HTN; LVP/EDP 205/17/26 mmHg ; CO/CI by Fick: 4.35, 2.44 - mildly reduced  . RIGHT/LEFT HEART CATH AND CORONARY ANGIOGRAPHY N/A 11/20/2017   Procedure: RIGHT/LEFT HEART CATH AND CORONARY ANGIOGRAPHY;  Surgeon: Larey Dresser, MD;   Location: [facility];  Service: Cardiovascular;  Laterality: N/A;  . TEE WITHOUT CARDIOVERSION N/A 12/30/2017   Procedure: (Intra-Op) TRANSESOPHAGEAL ECHOCARDIOGRAM (TEE);  Surgeon: Burnell Blanks, MD;  Location: [facility];  Service: Open Heart Surgery; Post TAVR good position of 23 mm AP and 3 valve.  No new or WMA.  EF 55-60%.  No effusion.  Mild perivalvular regurgitation seen.  Mean gradient 7 mmHg.  Peak 16 mmHg.  [name] TOTAL KNEE ARTHROPLASTY Left 12/01/2018   Procedure: Left Knee Arthroplasty;  Surgeon: Melrose Nakayama, MD;  Location: [facility];  Service: Orthopedics;  Laterality: Left;  . TRANSCATHETER AORTIC VALVE REPLACEMENT, TRANSFEMORAL  12/30/2017  . TRANSCATHETER AORTIC VALVE REPLACEMENT, TRANSFEMORAL N/A 12/30/2017   Procedure: TRANSCATHETER AORTIC VALVE REPLACEMENT, TRANSFEMORAL;  Surgeon: Burnell Blanks, MD;  Location: [facility];  Service: Open Heart Surgery;  Laterality: N/A;  . TRANSTHORACIC ECHOCARDIOGRAM  10/30/2016   Mild Concentric LVH. EF 55-60%. No RWMA, Gr 2 DD. Mod-Severe AS (mean-peak Gradient 31 mmHG - 64 mmHg), Mod MR. Mod LA dilation, Mod PA HTN - peak pressure ~54 mmHg). = Progression of AS from 2014  . TRANSTHORACIC ECHOCARDIOGRAM  01/2019   EF 55 to 60%.  Elevated LVEDP.  GR 1 DD.  No R WMA.  Moderate LA dilation.  Well-seated stented bioprosthetic aortic valve.  Mean gradient 13 mercury.  Moderate MAC moderate MR.  No evidence of mildly elevated RAP/CVP and moderately elevated PA P  . TRANSTHORACIC ECHOCARDIOGRAM  10/2017   a) Pre-TAVR 10/31/17: EF 55 to 60%.  GR 1 DD.  Calcific severe AS.  Peak Grad 62 mmHg, mean 32 mmHg.  Progression of Dz.  AVA ~0.8 cm.; b) Post TAVR 10/9/'19: EF 60-65%.  GR 1 DD.  Stable appearing 23 mm Edwards's Sapien AV bioprosthesis.  No perivalvular AI.  Mean Grad 22 mmHg; c) 11/13/'19: AoV mean Grad 19 mmHg,, Peak 36 mmHg. PAP ~33 mmHg  . ULTRASOUND GUIDANCE FOR VASCULAR ACCESS  08/09/2014   Procedure: Ultrasound Guidance For Vascular  Access;  Surgeon: Serafina Mitchell, MD;  Location: [facility];  Service: Cardiovascular;;    Pre-TAVR Echo October 31, 2017: EF 55 to 60%.  GR 1 DD.  Calcific severe aortic stenosis.  Peak gradient 62 million mercury, mean 32 mmHg.  Progression of disease.  AVA estimated 0.8 cm.  Pre-TAVR R&LHC: Low filling pressures with mild pulmonary pretension (status post diuresis) suspect group 3 PAH due to COPD.  PVR only 3.78 Wood units.  Nonobstructive CAD.  Valve not crossed.  Plan to proceed with TAVR.  Pre-/post TAVR TEE 12/30/2017: Post TAVR good position of 23 mm AP and 3 valve.  No new or WMA.  EF 55-60%.  No effusion.  Mild perivalvular regurgitation seen.  Mean gradient 7 mmHg.  Peak 16 mmHg  Echo 12/31/2017: EF 60-65%.  GR 1 DD.  Stable appearing 23 mm Edwards's Sapien AV bioprosthesis.  No perivalvular AI.  Mean gradient 22 mmHg.  Echo 02/04/2018: Ef 55-60%. Gr1 DD. AoV mean gradient 19 mmHg, Peak 36 mmHg. PAP ~33 mmHg . (February 17, 2019) TTE: EF 55 to 60%.  Elevated LVEDP.  GR 1 DD.  No R WMA.  Moderate LA dilation.  Well-seated stented bioprosthetic aortic valve.  Mean gradient 13 mercury.  Moderate MAC moderate MR.  No evidence of mildly elevated RAP/CVP and moderately elevated PA P . (February 11, 2019) CT Chest without Contrast: Aortic and coronary atherosclerosis.  Emphysema.  Similar, likely benign bilateral pulmonary nodules.  Bilateral nephrolithiasis.  MEDICATIONS/ALLERGIES   Current Meds  Medication Sig  . amLODipine (NORVASC) 10 MG tablet Take by mouth.  [name] aspirin 81 MG tablet Take 1 tablet (81 mg total) by mouth 2 (two) times daily at 10 AM and 5 PM.  . Azelastine HCl 0.15 % SOLN two sprays by Both Nostrils route 2 (two) times daily.  . bisoprolol-hydrochlorothiazide (ZIAC) 10-6.25 MG tablet Take 2 tablets by mouth daily.  . diclofenac Sodium (VOLTAREN) 1 % GEL APP 4 GRAMS EXT AA UP TO TID PRN  . ergocalciferol (VITAMIN D2) 1.25 MG (50000 UT) capsule Take by mouth.  .  losartan (COZAAR) 100 MG tablet Take by mouth.  . oxyCODONE-acetaminophen (PERCOCET) 10-325 MG tablet Take 1 tablet by mouth daily as needed.  . potassium chloride SA (KLOR-CON) 20 MEQ tablet Take 1 tablet (20 mEq total) by mouth daily.  . rosuvastatin (CRESTOR) 10 MG tablet Take by mouth.  [name] tiZANidine (ZANAFLEX) 4 MG tablet Take 1 tablet (4 mg total) by mouth every 6 (six) hours as needed.    Allergies  Allergen Reactions  . Codeine Nausea Only  . Symproic [Naldemedine] Nausea And Vomiting    SOCIAL HISTORY/FAMILY HISTORY   Reviewed in Epic:  Pertinent findings: Lives with her husband.  Retired.  Down to 1-2 cigarettes a day.  OBJCTIVE -PE, EKG, labs   Wt Readings from Last 3 Encounters:  11/11/19 153 lb (69.4 kg)  07/05/19 154 lb 6.4 oz (70 kg)  02/26/19 153 lb (69.4 kg)    Physical Exam: BP 138/70   Pulse (!) 53   Ht _0  (1.676 m)   Wt 153 lb (69.4 kg)   SpO2 95%   BMI 24.69 kg/m  Physical Exam Vitals reviewed.  Constitutional:      General: She is not in acute distress.    Appearance: Normal appearance. She is well-developed and normal weight.     Comments: Less ill-appearing.  Well-groomed.  Very quiet  HENT:     Head: Normocephalic and atraumatic.  Neck:     Vascular: No carotid bruit (Radiated aortic murmur), hepatojugular reflux or JVD.  Cardiovascular:     Rate and Rhythm: Normal rate and regular rhythm.  No extrasystoles are present.    Chest Wall: PMI is not displaced.     Pulses: Decreased pulses (Mildly decreased pedal pulses but intact.).     Heart sounds: S1 normal and S2 normal. Heart sounds are distant. Murmur (3/6C-D SEM at RUSB--neck) heard.  No friction rub. No gallop.   Pulmonary:     Effort: Pulmonary effort is normal. No respiratory distress.     Breath sounds: Normal breath sounds. No wheezing (Only with forced expiration).  Chest:     Chest wall: No tenderness.  Musculoskeletal:        General: Normal range of motion.     Cervical  back: Normal range of motion and neck supple.  Neurological:     Mental Status: She is alert and oriented to person, place, and time.  Psychiatric:        Behavior: Behavior normal.        Thought Content: Thought content normal.        Judgment: Judgment normal.      Adult ECG Report  Rate: 53;  Rhythm: sinus bradycardia and  Left atrial abnormality.  Stable IVCD pattern with likely LVH.Repolarization changes.  ; Normal axis, intervals and durations..  Narrative Interpretation: Relatively stable.  Recent Labs:   Lab Results  Component Value Date   CHOL 137 07/23/2019   HDL 46 07/23/2019   LDLCALC 72 07/23/2019   TRIG 104 07/23/2019   CHOLHDL 3.0 07/23/2019   Lab Results  Component Value Date   CREATININE 1.07 (H) 11/11/2019   BUN 13 11/11/2019   NA 142 11/11/2019   K 4.6 11/11/2019   CL 102 11/11/2019   CO2 27 11/11/2019   Lab Results  Component Value Date   TSH 1.620 01/14/2018    ASSESSMENT/PLAN    Problem List Items Addressed This Visit    Pulmonary hypertension: Combined secondary as well as lung disease (Chronic)    Probably some component of baseline lung disease related to pulmonary hypertension, however a lot of it was due to pulmonary venous congestion.  This has not been reassessed post TAVR but would expect that with improved diastolic function, pressures will have improved.      Relevant Medications   furosemide (LASIX) 20 MG tablet   Essential hypertension (Chronic)    Borderline blood pressure today.  I would prefer to avoid adding new medication since she is a pretty good doses of her current meds.  Can increase beta-blocker any further because heart rate 53.  I will add as needed furosemide since she is having some edema and mild exertional dyspnea which could be related to some mild pulmonary venous hypertension.      Relevant Medications   furosemide (LASIX) 20 MG tablet   Other Relevant Orders   Basic metabolic panel (Completed)   Chronic  diastolic (congestive) heart failure (HCC) - Primary (Chronic)    Again I am not sure how much of the component of her dyspnea is related to diastolic dysfunction/pulmonary venous hypertension versus underlying lung disease.  She had electrolyte and renal function issues with full dose HCTZ.  Therefore went back to 6.25 mg in combination with bisoprolol.  She still has some edema every now and then so therefore will provide as needed Lasix 20 mg .  Also recommended that she use support stockings medium weight to help with some of the swelling that she is having as I think this is more likely be related to venous stasis.      Relevant Medications   furosemide (LASIX) 20 MG tablet   Other Relevant Orders   Basic metabolic panel (Completed)   EKG 12-Lead (Completed)   Coronary artery disease, non-occlusive (Chronic)    Moderate disease at best by preop cath.  Plan: Continue statin, beta-blocker and ARB along with aspirin.  Smoking cessation counseling      Relevant Medications   furosemide (LASIX) 20 MG tablet   Other Relevant Orders   Basic metabolic panel (Completed)   EKG 12-Lead (Completed)   PAT (paroxysmal atrial tachycardia) (HCC) (Chronic)    Seems to be doing okay with current dose of bisoprolol.  With current heart rate, I  think are better off of amlodipine as opposed to combination of bisoprolol plus diltiazem.      Relevant Medications   furosemide (LASIX) 20 MG tablet   Hyperlipidemia (Chronic)    Lipids look great as of April.  She seems to be tolerating the rosuvastatin pretty well.      Relevant Medications   furosemide (LASIX) 20 MG tablet   S/P TAVR (transcatheter aortic valve replacement) (Chronic)    Stable echo as of April.  Functioning well.  Gradient stable.  She has a follow-up echo ordered for November of this year.      Relevant Orders   Basic metabolic panel (Completed)   EKG 12-Lead (Completed)    Other Visit Diagnoses    DOE (dyspnea on  exertion)       Relevant Orders   Basic metabolic panel (Completed)       COVID-19 Education: The signs and symptoms of COVID-19 were discussed with the patient and how to seek care for testing (follow up with PCP or arrange E-visit).   The importance of social distancing was discussed today.  I spent a total of 45mnutes with the patient. >  50% of the time was spent in direct patient consultation.  Additional time spent with chart review  / charting (studies, outside notes, etc): 9 Total Time: 31 min   Current medicines are reviewed at length with the patient today.  (+/- concerns) none  Notice: This dictation was prepared with Dragon dictation along with smaller phrase technology. Any transcriptional errors that result from this process are unintentional and may not be corrected upon review.  Patient Instructions / Medication Changes & Studies & Tests Ordered   Patient Instructions  Medication Instructions:   Furosemide 20 mg may take as needed if you have some swelling noted *If you need a refill on your cardiac medications before your next appointment, please call your pharmacy*   Lab Work: BMP today  If you have labs (blood work) drawn today and your tests are completely normal, you will receive your results only by: .Marland KitchenMyChart Message (if you have MyChart) OR . A paper copy in the mail If you have any lab test that is abnormal or we need to change your treatment, we will call you to review the results.   Testing/Procedures: Not needed   Follow-Up: [facility] you and your health needs are our priority.  As part of our continuing mission to provide you with exceptional heart care, we have created designated Provider Care Teams.  These Care Teams include your primary Cardiologist (physician) and Advanced Practice Providers (APPs -  Physician Assistants and Nurse Practitioners) who all work together to provide you with the care you need, when you need it.  We  recommend signing up for the patient portal called "MyChart".  Sign up information is provided on this After Visit Summary.  MyChart is used to connect with patients for Virtual Visits (Telemedicine).  Patients are able to view lab/test results, encounter notes, upcoming appointments, etc.  Non-urgent messages can be sent to your provider as well.   To learn more about what you can do with MyChart, go to [email or web address]    Your next appointment:   6 month(s)  The format for your next appointment:   In Person  Provider:   DGlenetta Hew MD   Other Instructions   Would like for you to purchase some support /compression socks  Medium weight  10- 153mg    Studies Ordered:  Orders Placed This Encounter  Procedures  . Basic metabolic panel  . EKG 12-Lead     Glenetta Hew, M.D., M.S. Interventional Cardiologist   Pager # [phone] Phone # [phone] [address]. [place], [place] [address]   Thank you for choosing [facility] at [facility]!!

## 2019-11-12 LAB — BASIC METABOLIC PANEL
BUN/Creatinine Ratio: 12 (ref 12–28)
BUN: 13 mg/dL (ref 8–27)
CO2: 27 mmol/L (ref 20–29)
Calcium: 9.5 mg/dL (ref 8.7–10.3)
Chloride: 102 mmol/L (ref 96–106)
Creatinine, Ser: 1.07 mg/dL — ABNORMAL HIGH (ref 0.57–1.00)
GFR calc Af Amer: 61 mL/min/{1.73_m2} (ref >59)
GFR calc non Af Amer: 53 mL/min/{1.73_m2} — ABNORMAL LOW (ref >59)
Glucose: 73 mg/dL (ref 65–99)
Potassium: 4.6 mmol/L (ref 3.5–5.2)
Sodium: 142 mmol/L (ref 134–144)

## 2019-11-19 ENCOUNTER — Encounter: Payer: Self-pay | Admitting: Cardiology

## 2019-11-19 NOTE — Assessment & Plan Note (Signed)
Moderate disease at best by preop cath.  Plan: Continue statin, beta-blocker and ARB along with aspirin.  Smoking cessation counseling

## 2019-11-19 NOTE — Assessment & Plan Note (Addendum)
Again I am not sure how much of the component of her dyspnea is related to diastolic dysfunction/pulmonary venous hypertension versus underlying lung disease.  She had electrolyte and renal function issues with full dose HCTZ.  Therefore went back to 6.25 mg in combination with bisoprolol.  She still has some edema every now and then so therefore will provide as needed Lasix 20 mg .  Also recommended that she use support stockings medium weight to help with some of the swelling that she is having as I think this is more likely be related to venous stasis.

## 2019-11-19 NOTE — Assessment & Plan Note (Signed)
Stable echo as of April.  Functioning well.  Gradient stable.  She has a follow-up echo ordered for November of this year.

## 2019-11-19 NOTE — Assessment & Plan Note (Signed)
Lipids look great as of April.  She seems to be tolerating the rosuvastatin pretty well.

## 2019-11-19 NOTE — Assessment & Plan Note (Signed)
Probably some component of baseline lung disease related to pulmonary hypertension, however a lot of it was due to pulmonary venous congestion.  This has not been reassessed post TAVR but would expect that with improved diastolic function, pressures will have improved.

## 2019-11-19 NOTE — Assessment & Plan Note (Signed)
Seems to be doing okay with current dose of bisoprolol.  With current heart rate, I think are better off of amlodipine as opposed to combination of bisoprolol plus diltiazem.

## 2019-11-19 NOTE — Assessment & Plan Note (Addendum)
Borderline blood pressure today.  I would prefer to avoid adding new medication since she is a pretty good doses of her current meds.  Can increase beta-blocker any further because heart rate 53.  I will add as needed furosemide since she is having some edema and mild exertional dyspnea which could be related to some mild pulmonary venous hypertension.

## 2019-11-25 ENCOUNTER — Telehealth: Payer: Self-pay | Admitting: Cardiology

## 2019-11-25 NOTE — Telephone Encounter (Signed)
Alexandra [name] is returning Alexandra [name]'s call in regards to her lab results.

## 2019-11-25 NOTE — Telephone Encounter (Signed)
The patient has been notified of the result and verbalized understanding.  All questions (if any) were answered. Cleon Gustin, RN 11/25/2019 1:26 PM

## 2019-11-25 NOTE — Telephone Encounter (Signed)
Meryl Crutch, RN  11/25/2019 10:06 AM EDT Back to Top    Left message to call back.   Leonie Man, MD  11/18/2019 10:03 PM EDT     Chemistry panel looks much better. Kidney function looks great. Potassium is stable.  Glenetta Hew, MD

## 2020-02-10 ENCOUNTER — Other Ambulatory Visit: Payer: Self-pay | Admitting: Cardiology

## 2020-02-10 NOTE — Telephone Encounter (Signed)
°*  STAT* If patient is at the pharmacy, call can be transferred to refill team.   1. Which medications need to be refilled? (please list name of each medication and dose if known) rosuvastatin (CRESTOR) 10 MG tablet  2. Which pharmacy/location (including street and city if local pharmacy) is medication to be sent to? [facility] [place], [place] - [address]  3. Do they need a 30 day or 90 day supply? 90 day supply

## 2020-03-07 ENCOUNTER — Ambulatory Visit
Admission: RE | Admit: 2020-03-07 | Discharge: 2020-03-07 | Disposition: A | Discharge status: Home / Self Care | Payer: Medicare HMO | Source: Ambulatory Visit | Attending: Physician Assistant | Admitting: Physician Assistant

## 2020-03-07 ENCOUNTER — Other Ambulatory Visit: Payer: Self-pay | Admitting: Physician Assistant

## 2020-03-07 ENCOUNTER — Other Ambulatory Visit: Payer: Self-pay

## 2020-03-07 DIAGNOSIS — G8929 Other chronic pain: Secondary | ICD-10-CM

## 2020-04-06 IMAGING — CT CT CHEST W/O CM
2 of 4 series · 11 of 36 positions shown, 13 images · non-contrast
Comparison: 11/26/2018 chest radiograph.  Most recent CT 12/18/2017

CLINICAL DATA: Follow-up of pulmonary nodule.  COPD.

EXAM:
CT CHEST WITHOUT CONTRAST
TECHNIQUE: Multidetector CT imaging of the chest was performed following the
standard protocol without IV contrast.

[Series 2: chest 2.00 br40 s3 · axial · 0.57mm/px · z∈[+1463,+1723]mm · 8 of 152 slices shown, 10 images (1 of 2)]
[im 11/152  mediastinal]
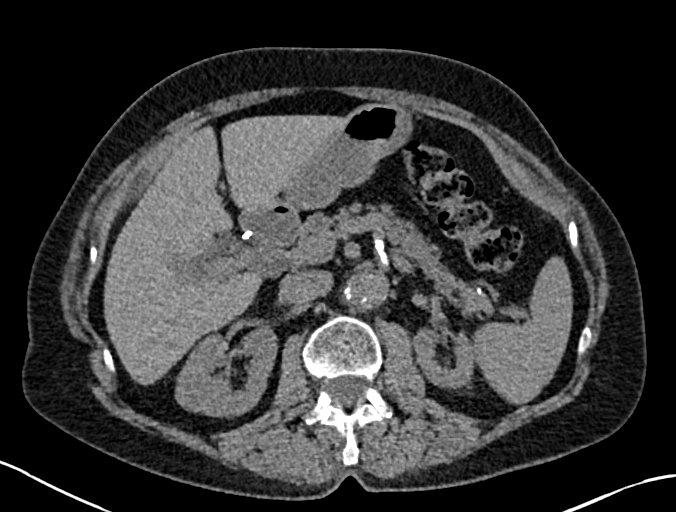
[im 11/152  lung]
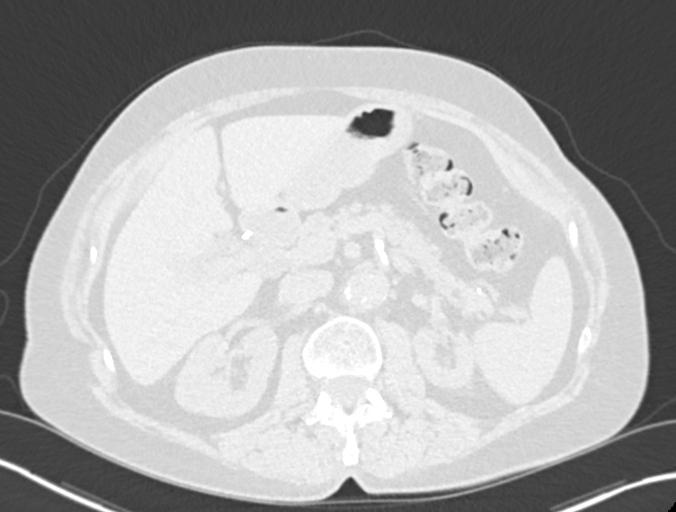
[im 33/152  lung]
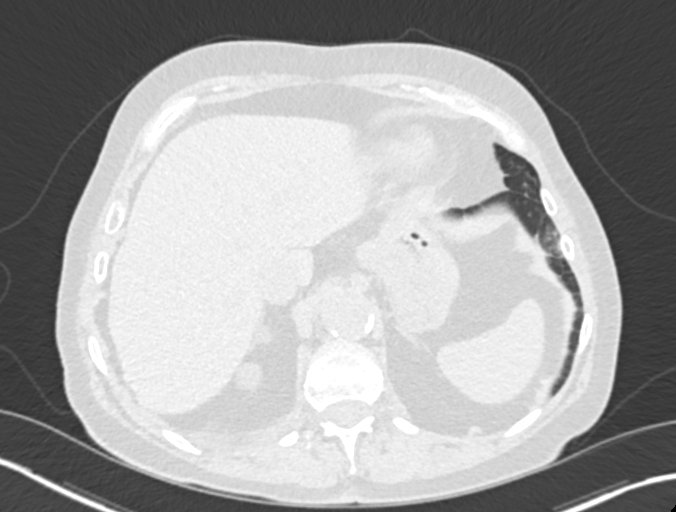
[im 54/152  lung]
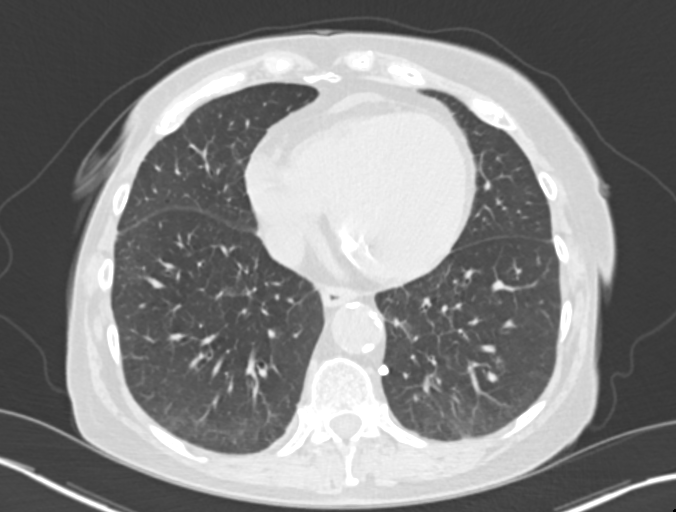
[im 65/152  lung]
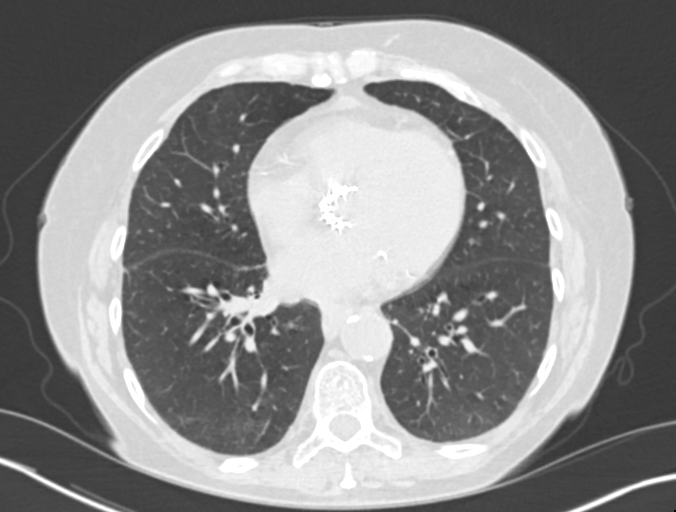
[im 87/152  mediastinal]
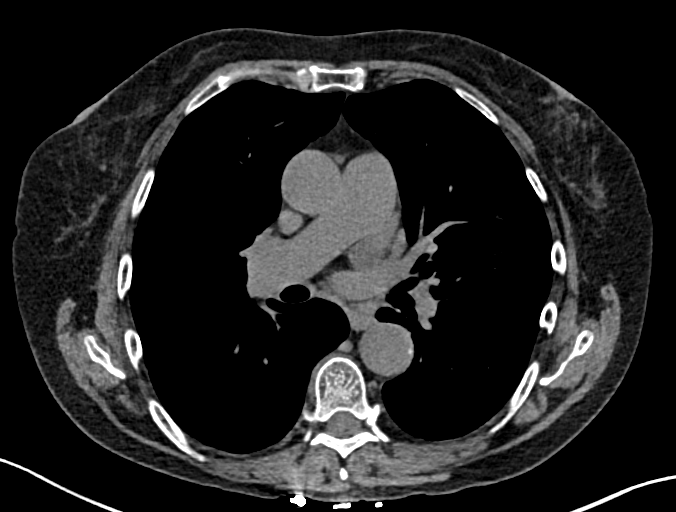
[im 87/152  lung]
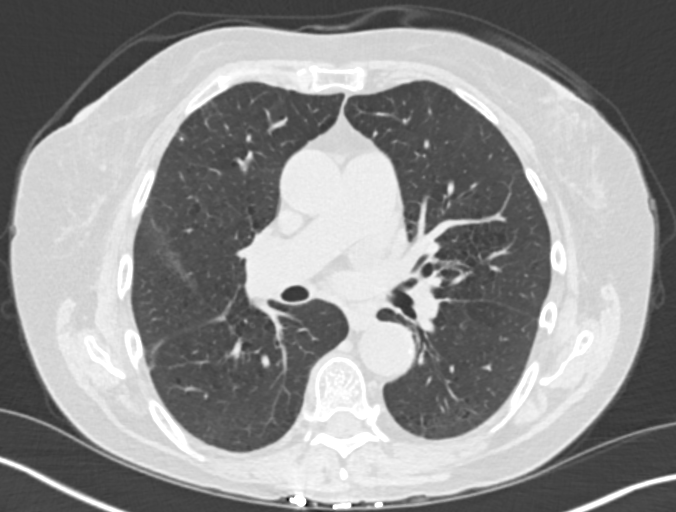
[im 98/152  lung]
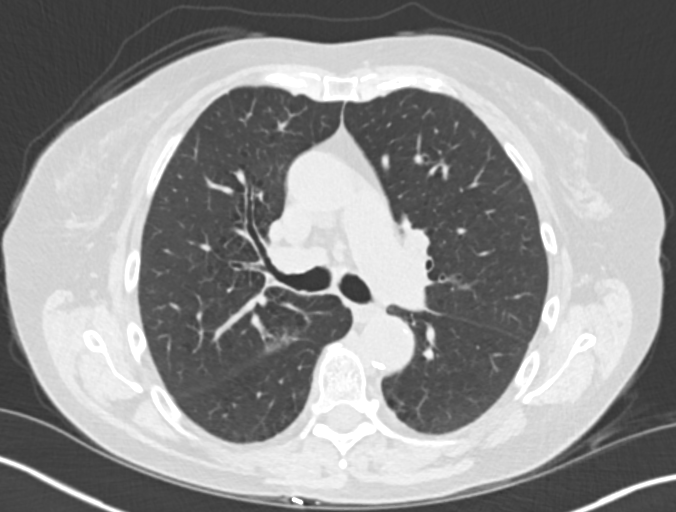
[im 119/152  lung]
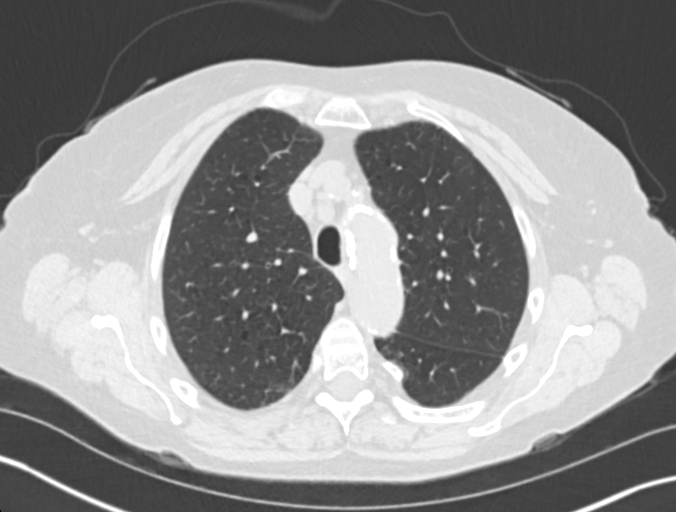
[im 141/152  lung]
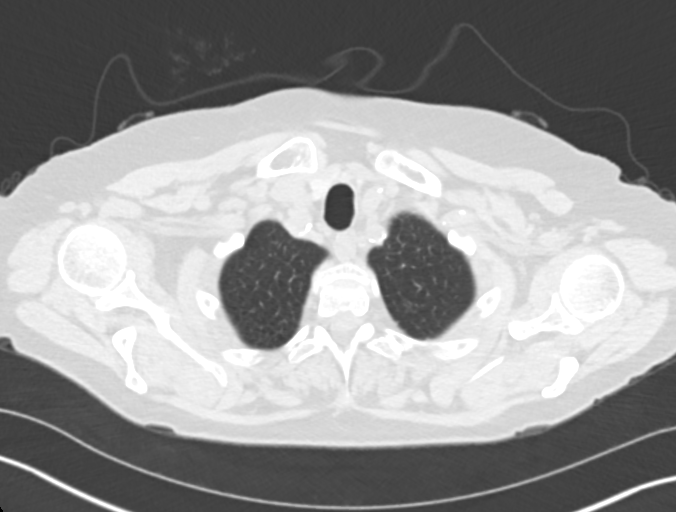

[Series 4: chest 2.00 br40 s3 · coronal · 0.59mm/px · 3 of 145 slices shown (2 of 2)]
[im 29/145  lung]
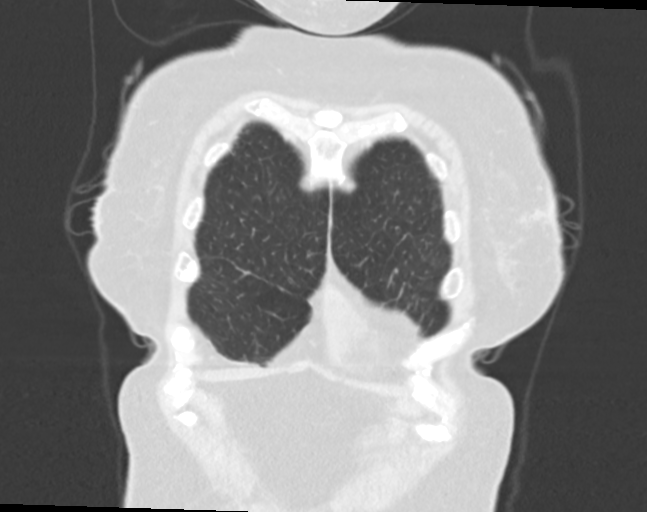
[im 58/145  lung]
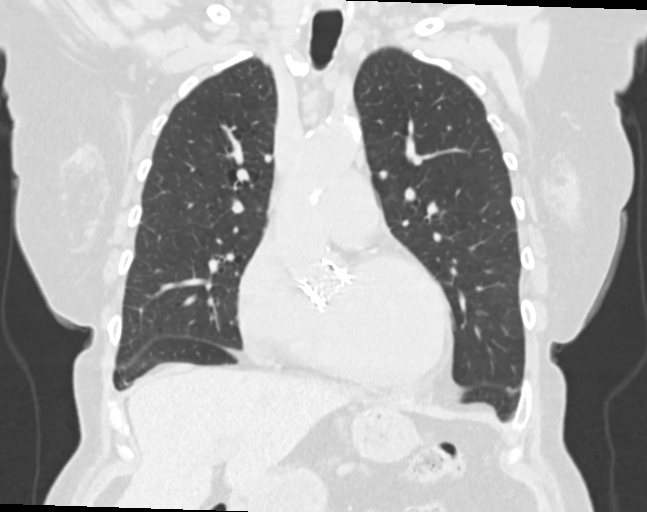
[im 87/145  lung]
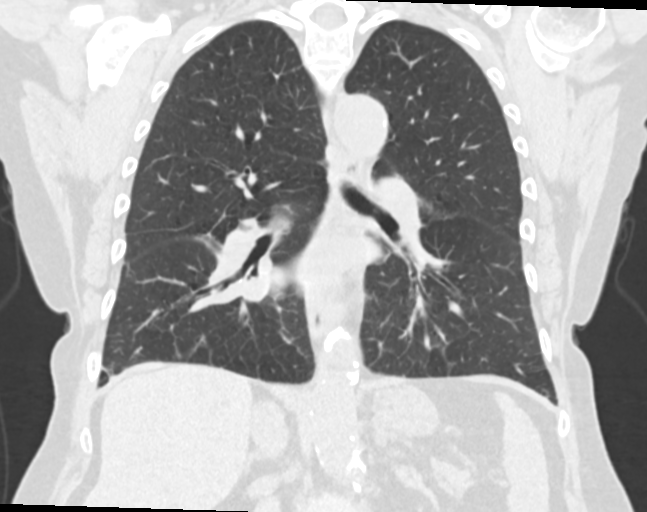

[11 of 36 positions shown; findings below may reference images not displayed]

FINDINGS: Cardiovascular: Advanced aortic and branch vessel atherosclerosis.
Tortuous thoracic aorta. Aortic valve repair. Mild cardiomegaly with
minimal anterior pericardial thickening. Multivessel coronary artery
atherosclerosis.

Mediastinum/Nodes: Prominent but not pathologically sized middle
mediastinal nodes. Hilar regions poorly evaluated without
intravenous contrast.

Lungs/Pleura: No pleural fluid.  Moderate centrilobular emphysema.

Left lower lobe scarring.

Scattered tiny pulmonary nodules are again identified, including on
series 8. The largest is within the anterior right lower lobe at 5
mm on 73/8, similar.

Upper Abdomen: Normal imaged portions of the liver, spleen, stomach,
pancreas, adrenal glands. Cholecystectomy. Right renal cysts. Right
renal vascular calcifications with bilateral punctate renal
collecting system calculi. Mild left renal atrophy.

Musculoskeletal: Remote right ninth posterolateral rib fracture.
IMPRESSION: 1. Similar bilateral pulmonary nodules, consistent with a benign
etiology.
2. Aortic atherosclerosis (NAWH8-VI0.0), coronary artery
atherosclerosis and emphysema (NAWH8-B5W.0).
3. Bilateral nephrolithiasis.

## 2020-04-24 ENCOUNTER — Other Ambulatory Visit: Payer: Self-pay | Admitting: Cardiovascular Disease

## 2020-05-05 ENCOUNTER — Encounter: Payer: Self-pay | Admitting: Cardiology

## 2020-05-05 ENCOUNTER — Other Ambulatory Visit: Payer: Self-pay

## 2020-05-05 ENCOUNTER — Other Ambulatory Visit: Payer: Self-pay | Admitting: Cardiology

## 2020-05-05 ENCOUNTER — Ambulatory Visit: Payer: Medicare HMO | Admitting: Cardiology

## 2020-05-05 VITALS — BP 124/70 | HR 52 | Ht 65.0 in | Wt 164.0 lb

## 2020-05-05 DIAGNOSIS — I471 Supraventricular tachycardia: Secondary | ICD-10-CM | POA: Diagnosis not present

## 2020-05-05 DIAGNOSIS — M79606 Pain in leg, unspecified: Secondary | ICD-10-CM | POA: Insufficient documentation

## 2020-05-05 DIAGNOSIS — I5032 Chronic diastolic (congestive) heart failure: Secondary | ICD-10-CM

## 2020-05-05 DIAGNOSIS — I272 Pulmonary hypertension, unspecified: Secondary | ICD-10-CM | POA: Diagnosis not present

## 2020-05-05 DIAGNOSIS — F17200 Nicotine dependence, unspecified, uncomplicated: Secondary | ICD-10-CM

## 2020-05-05 DIAGNOSIS — Z952 Presence of prosthetic heart valve: Secondary | ICD-10-CM

## 2020-05-05 DIAGNOSIS — R6889 Other general symptoms and signs: Secondary | ICD-10-CM | POA: Insufficient documentation

## 2020-05-05 DIAGNOSIS — R053 Chronic cough: Secondary | ICD-10-CM | POA: Insufficient documentation

## 2020-05-05 DIAGNOSIS — R918 Other nonspecific abnormal finding of lung field: Secondary | ICD-10-CM

## 2020-05-05 DIAGNOSIS — M79604 Pain in right leg: Secondary | ICD-10-CM

## 2020-05-05 DIAGNOSIS — J449 Chronic obstructive pulmonary disease, unspecified: Secondary | ICD-10-CM

## 2020-05-05 DIAGNOSIS — I10 Essential (primary) hypertension: Secondary | ICD-10-CM | POA: Diagnosis not present

## 2020-05-05 DIAGNOSIS — M79605 Pain in left leg: Secondary | ICD-10-CM

## 2020-05-05 DIAGNOSIS — I2510 Atherosclerotic heart disease of native coronary artery without angina pectoris: Secondary | ICD-10-CM

## 2020-05-05 MED ORDER — GUAIFENESIN ER 600 MG PO TB12: 600.0000 mg | ORAL_TABLET | Freq: Two times a day (BID) | ORAL | 11 refills | Status: DC

## 2020-05-05 MED ORDER — CLOPIDOGREL BISULFATE 75 MG PO TABS: 75.0000 mg | ORAL_TABLET | Freq: Every day | ORAL | 3 refills | Status: DC

## 2020-05-05 MED ORDER — CETIRIZINE HCL 10 MG PO TABS: 10.0000 mg | ORAL_TABLET | Freq: Every day | ORAL | 11 refills | Status: DC

## 2020-05-05 MED ORDER — ESOMEPRAZOLE MAGNESIUM 40 MG PO CPDR: 40.0000 mg | DELAYED_RELEASE_CAPSULE | Freq: Every day | ORAL | 2 refills | Status: DC

## 2020-05-05 NOTE — Progress Notes (Signed)
Primary Care Provider: [facility] Cardiologist: Glenetta Hew, MD Electrophysiologist: None  Clinic Note: Chief Complaint  Patient presents with  . Follow-up  . Cardiac Valve Problem    Status post TAVR  . Cough   ===================================  ASSESSMENT/PLAN   Problem List Items Addressed This Visit    Pulmonary hypertension: Combined secondary as well as lung disease - Primary (Chronic)    Underlying lung disease along with valvular heart disease the diastolic function. Regard to some of this would have improved with TAVR.  She is due to recheck echocardiogram.      Relevant Orders   DG Chest 2 View   ECHOCARDIOGRAM COMPLETE   Resistant hypertension (Chronic)    Blood pressure looks good.  She is on losartan, amlodipine and combination bisoprolol/HCTZ. Currently for medications to maintain stable blood pressure.      Chronic diastolic (congestive) heart failure (HCC) (Chronic)    Chief does have some HFpEF with high blood pressure and aortic valve disease. Now noticing what sounds may be orthopnea, but it has morbid cough and rattling sound.  Plan: Check chest x-ray to ensure no pneumonia or bronchitis. Check 2D echo Add furosemide as a more standing medication: For 2 weeks take furosemide 3 days a week and take an extra potassium pill. After the 2 weeks, for another 2 weeks take furosemide for 2 days a week with potassium.  Weigh yourself daily  - and use  Furosemide if weight goes up 3 lbs or  Increase swelling.      Relevant Orders   ECHOCARDIOGRAM COMPLETE   Abnormal ankle brachial index (ABI) (Chronic)   Relevant Orders   VAS [place] LOWER EXTREMITY ARTERIAL DUPLEX   VAS [place] ABI WITH/WO TBI   COPD (chronic obstructive pulmonary disease) ([place]) (Chronic)    She still smoking, I try to encourage her to quit smoking only.  This is clearly mainly reason for cough.  However the change in her cough is what is concerning.  She  may need pulmonary evaluation.  Recommend that she talk with her PCP or pulmonologist-she may be due for a follow-up CT scan of her chest.      Relevant Medications   cetirizine (ZYRTEC) 10 MG tablet   guaiFENesin (MUCINEX) 600 MG 12 hr tablet   Smoker (Chronic)   Relevant Orders   DG Chest 2 View   Coronary artery disease, non-occlusive (Chronic)    No significant disease noted.  TAVR cath.  She is on low-dose rosuvastatin along with aspirin.  She also is on beta-blocker, and amlodipine.      PAT (paroxysmal atrial tachycardia) (HCC) (Chronic)    No symptoms on current dose of beta-blocker.      S/P TAVR (transcatheter aortic valve replacement) (Chronic)    Was supposed to have an echo done last year.  We will schedule recheck echo in May.      Relevant Orders   ECHOCARDIOGRAM COMPLETE   Leg pain    Leg pain in November the sense that she has high risk ABIs. Question if this is claudication or not.  Plan: Check LEA Dopplers.      Relevant Orders   VAS [place] LOWER EXTREMITY ARTERIAL DUPLEX   VAS [place] ABI WITH/WO TBI   Pulmonary nodules   Relevant Orders   DG Chest 2 View   ECHOCARDIOGRAM COMPLETE   Chronic cough    Multiple different potential etiologies for cough.  She has COPD, but also sounds like she  may have symptoms of GERD.  Multifactorial: Check 2D echo and chest x-ray With possible GERD as etiology, asked her to start Nexium 40 mg or Prilosec OTC 40 mg daily. Add Zyrtec 10 mg nightly along with Mucinex with her Robitussin.      Relevant Orders   DG Chest 2 View     ===================================  HPI:    Alexandra [name] is a 70 y.o. female with a PMH notable for COPD, AS-s/p TAVR , Chronic HFpEF & Pulm HTNwho presents today for 6 month f/u.   12/30/2017: TAVR (23 mm Edwards sapient 3THV via TF approach.  LAURISA SAHAKIAN was last seen on 11/11/2019 -> overall stable cardiac standpoint.  Still noticing some instability at following her knee surgery.   Right leg is shorter quite a bit.  No real chest discomfort or change in her baseline dyspnea.  She has baseline swelling from her injuries.  Had not required any diuretic beyond HCTZ.  Palpitations were stable.  Notably improved.;  Still smoking about 2 to 4 cigarettes a day.  Not actually stopping.  Recent Hospitalizations: None  Reviewed  CV studies:    The following studies were reviewed today: (if available, images/films reviewed: From Epic Chart or Care Everywhere) . None:  Interval History:   [name] returns for routine follow-up stating that she is put on some weight about 9 pounds since her last visit.  She is little more short of than usual and started having more swelling.  She does use compression stockings and it helps some.  But she is noticing more over the last month or so that she is been having nagging cough and neck, gurgling sound in her chest when she lays down.  Does not note true orthopnea or PND.  Mild swelling.  No chest pain or pressure.  No palpitations.  She said that some of her menstruation became at her house and they did some testing on her arms and legs and gave her 1 and said she had high risk narrowings in her legs.  (Likely ABIs and TBI's -> with the checkbox in the severe category)  CV Review of Symptoms (Summary): positive for - dyspnea on exertion, edema and Cough; also some leg pain when walking. negative for - chest pain, irregular heartbeat, palpitations, paroxysmal nocturnal dyspnea, rapid heart rate or Lightheadedness or dizziness, syncope/near syncope or TIA/amaurosis fugax,   The patient does not have symptoms concerning for COVID-19 infection (fever, chills, cough, or new shortness of breath).   REVIEWED OF SYSTEMS   Review of Systems  Constitutional: Positive for malaise/fatigue (Still has low energy levels.). Negative for weight loss (Gained weight).  HENT: Positive for congestion and nosebleeds.   Respiratory: Positive for cough and  shortness of breath. Negative for sputum production (Just mucus) and wheezing.   Cardiovascular: Positive for claudication. Negative for palpitations.  Gastrointestinal: Positive for heartburn (Irritates in the mouth when she wakes up). Negative for blood in stool and melena.  Genitourinary: Negative for hematuria.  Musculoskeletal: Positive for myalgias. Negative for falls and joint pain (Knee still hurt.).  Neurological: Positive for dizziness (If she stands up too fast). Negative for focal weakness and weakness.  Psychiatric/Behavioral: Negative for depression (At least dysthymia) and memory loss. The patient is not nervous/anxious and does not have insomnia.     I have reviewed and (if needed) personally updated the patient's problem list, medications, allergies, past medical and surgical history, social and family history.   PAST MEDICAL HISTORY  Past Medical History:  Diagnosis Date  . Arthritis   . COPD (chronic obstructive pulmonary disease) ([place]) 05/22/2016   Severely decreased DLCO by PFTs in April 2019 but only mild obstructive disease.   . Coronary artery disease, non-occlusive 11/28/2016   R&LHC: 65% distal LAD lesion -- likely not angiographically significant->medical management. Normal LVEF. Moderately elevated LVEDP. I aortic valve gradient in the Cath Lab, moderate aortic stenosis. This would suggest that the stenosis is on the moderate side of moderate to severe.  Severe pulmonary hypertension (likely mixed).  AVA 0.96 cm; P-P gradient ~ 30 mmHg, Mean ~24.5-27.5 (Moderate AS)   . GERD (gastroesophageal reflux disease)   . Heart murmur   . Hx of adenomatous colonic polyps 10/19/2015  . Hyperlipidemia   . Hypertension   . Moderate mitral regurgitation by prior echocardiography 10/2016   Echocardiogram revealed moderate mitral regurgitation; most recent Echo 02/12/2019- MAC with moderate MR  . Pulmonary hypertension associated Aortic Stenosis, Mitral Regurgitation & COPD     Peak PAP by Echo ~54 mmHg;  by Cath PAP/Mean 71/32 mmHg, 53 mmHg (with PCWP & LVEDP ~24-26 mmHg); TPG ~26 mmHg  . Pulmonary nodules   . S/P TAVR (transcatheter aortic valve replacement) 12/30/2017   Edward's Sapien 3 bioprosthetic THB via TF approach  . Severe aortic stenosis 2018   Underwent TAVR in 2019    PAST SURGICAL HISTORY   Past Surgical History:  Procedure Laterality Date  . APPENDECTOMY    . BREAST CYST EXCISION Right 1970  . CARDIAC VALVE REPLACEMENT    . CATARACT EXTRACTION W/ INTRAOCULAR LENS IMPLANT Right   . CHOLECYSTECTOMY OPEN    . COLONOSCOPY  2003, 2017  . CTA Chest  09/2016   Thoracic Aortic Ca2+ w/o dilation.  Coronary Calcification noted. PA normal. Mild Emphysematous changes w/ mild LLL scarring.    [name] FRACTURE SURGERY     2007 -right tib /fib fracture ----07/2009 right femur fx after a fall  . FRACTURE SURGERY Right 2011   Femur  . PERIPHERAL VASCULAR CATHETERIZATION N/A 08/09/2014   Procedure: Aortic Arch Angiography;  Surgeon: Serafina Mitchell, MD;  Location: [facility];  Service: Cardiovascular: Type 1 Arch. No significant stenosis, aneurysmal degeneration or dissection.  No luminal irregularity seen in R or L SubClavian, Brachial or Radial A. Chronic distal ulnar A occlusion [place].  [name] PERIPHERAL VASCULAR CATHETERIZATION Right 08/09/2014   Procedure: Upper Extremity Angiography;  Surgeon: Serafina Mitchell, MD;  Location: [facility];  Service: Cardiovascular;  Laterality: Right;  . RIGHT/LEFT HEART CATH AND CORONARY ANGIOGRAPHY N/A 12/06/2016   Procedure: RIGHT/LEFT HEART CATH AND CORONARY ANGIOGRAPHY;  Surgeon: Leonie Man, MD;  Location: [facility]: Cor Angio: 65% dLAD (Med Rx). AVA 0.96 cm; P-P gradient ~ 30 mmHg, Mean ~24.5-27.5 (Mod AS); RHC #s: RAP 8 mmHg, RVP/EDP: 71/8/15 mmHg, PCWP: 21-24 mmHg, PAP/mean: 71/32/52 mmHg = Severe Mixed Pulmonary HTN; LVP/EDP 205/17/26 mmHg ; CO/CI by Fick: 4.35, 2.44 - mildly reduced  . RIGHT/LEFT  HEART CATH AND CORONARY ANGIOGRAPHY N/A 11/20/2017   Procedure: RIGHT/LEFT HEART CATH AND CORONARY ANGIOGRAPHY;  Surgeon: Larey Dresser, MD;  Location: [facility];  Service: Cardiovascular;  Laterality: N/A;  . TEE WITHOUT CARDIOVERSION N/A 12/30/2017   Procedure: (Intra-Op) TRANSESOPHAGEAL ECHOCARDIOGRAM (TEE);  Surgeon: Burnell Blanks, MD;  Location: [facility];  Service: Open Heart Surgery; Post TAVR good position of 23 mm AP and 3 valve.  No new or WMA.  EF 55-60%.  No effusion.  Mild perivalvular regurgitation seen.  Mean gradient 7 mmHg.  Peak 16 mmHg.  [name] TOTAL KNEE ARTHROPLASTY Left 12/01/2018   Procedure: Left Knee Arthroplasty;  Surgeon: Melrose Nakayama, MD;  Location: [facility];  Service: Orthopedics;  Laterality: Left;  . TRANSCATHETER AORTIC VALVE REPLACEMENT, TRANSFEMORAL  12/30/2017  . TRANSCATHETER AORTIC VALVE REPLACEMENT, TRANSFEMORAL N/A 12/30/2017   Procedure: TRANSCATHETER AORTIC VALVE REPLACEMENT, TRANSFEMORAL;  Surgeon: Burnell Blanks, MD;  Location: [facility];  Service: Open Heart Surgery;  Laterality: N/A;  . TRANSTHORACIC ECHOCARDIOGRAM  10/30/2016   Mild Concentric LVH. EF 55-60%. No RWMA, Gr 2 DD. Mod-Severe AS (mean-peak Gradient 31 mmHG - 64 mmHg), Mod MR. Mod LA dilation, Mod PA HTN - peak pressure ~54 mmHg). = Progression of AS from 2014  . TRANSTHORACIC ECHOCARDIOGRAM  01/2019   EF 55 to 60%.  Elevated LVEDP.  GR 1 DD.  No R WMA.  Moderate LA dilation.  Well-seated stented bioprosthetic aortic valve.  Mean gradient 13 mercury.  Moderate MAC moderate MR.  No evidence of mildly elevated RAP/CVP and moderately elevated PA P  . TRANSTHORACIC ECHOCARDIOGRAM  10/2017   a) Pre-TAVR 10/31/17: EF 55 to 60%.  GR 1 DD.  Calcific severe AS.  Peak Grad 62 mmHg, mean 32 mmHg.  Progression of Dz.  AVA ~0.8 cm.; b) Post TAVR 10/9/'19: EF 60-65%.  GR 1 DD.  Stable appearing 23 mm Edwards's Sapien AV bioprosthesis.  No perivalvular AI.  Mean Grad 22 mmHg; c) 11/13/'19: AoV mean  Grad 19 mmHg,, Peak 36 mmHg. PAP ~33 mmHg  . ULTRASOUND GUIDANCE FOR VASCULAR ACCESS  08/09/2014   Procedure: Ultrasound Guidance For Vascular Access;  Surgeon: Serafina Mitchell, MD;  Location: [facility];  Service: Cardiovascular;;    Immunization History  Administered Date(s) Administered  . Fluad Quad(high Dose 65+) 12/02/2018  . Influenza,inj,Quad PF,6+ Mos 02/20/2016  . Influenza-Unspecified 11/17/2016  . PFIZER(Purple Top)SARS-COV-2 Vaccination 07/08/2019, 07/29/2019  . Pneumococcal Conjugate-13 08/15/2015  . Pneumococcal Polysaccharide-23 12/24/2016  . Td 08/15/2015  . Tdap 07/23/2009  . Zoster Recombinat (Shingrix) 07/31/2016, 11/17/2016    MEDICATIONS/ALLERGIES   Current Meds  Medication Sig  . amLODipine (NORVASC) 10 MG tablet Take by mouth.  [name] aspirin 81 MG tablet Take 1 tablet (81 mg total) by mouth 2 (two) times daily at 10 AM and 5 PM.  . Azelastine HCl 0.15 % SOLN two sprays by Both Nostrils route 2 (two) times daily.  . bisoprolol-hydrochlorothiazide (ZIAC) 10-6.25 MG tablet TAKE 2 TABLETS BY MOUTH DAILY  . cetirizine (ZYRTEC) 10 MG tablet Take 1 tablet (10 mg total) by mouth daily.  . Cholecalciferol (VITAMIN D) 50 MCG (2000 UT) CAPS Take 2 capsules by mouth daily.  . diclofenac Sodium (VOLTAREN) 1 % GEL APP 4 GRAMS EXT AA UP TO TID PRN  . esomeprazole (NEXIUM) 40 MG capsule Take 1 capsule (40 mg total) by mouth daily at 12 noon.  . furosemide (LASIX) 20 MG tablet Take 1 tablet (20 mg total) by mouth as needed. For increase  swelling  . guaiFENesin (MUCINEX) 600 MG 12 hr tablet Take 1 tablet (600 mg total) by mouth 2 (two) times daily.  [name] losartan (COZAAR) 100 MG tablet Take by mouth.  . oxyCODONE-acetaminophen (PERCOCET) 10-325 MG tablet Take 1 tablet by mouth daily as needed.  . potassium chloride SA (KLOR-CON) 20 MEQ tablet Take 1 tablet (20 mEq total) by mouth daily.  . rosuvastatin (CRESTOR) 10 MG tablet Take by mouth.  [name]  tiZANidine (ZANAFLEX) 4 MG  tablet Take 4 mg by mouth at bedtime.  . [DISCONTINUED] clopidogrel (PLAVIX) 75 MG tablet Take by mouth daily in the afternoon.  . [DISCONTINUED] ergocalciferol (VITAMIN D2) 1.25 MG (50000 UT) capsule Take by mouth.    Allergies  Allergen Reactions  . Codeine Nausea Only  . Symproic [Naldemedine] Nausea And Vomiting    SOCIAL HISTORY/FAMILY HISTORY   Reviewed in Epic:  Pertinent findings:  Social History   Tobacco Use  . Smoking status: Light Tobacco Smoker    Packs/day: 0.10    Years: 40.00    Pack years: 4.00    Types: Cigarettes  . Smokeless tobacco: Never Used  . Tobacco comment: 1-2 cigarettes a day  Vaping Use  . Vaping Use: Never used  Substance Use Topics  . Alcohol use: Not Currently    Alcohol/week: 0.0 standard drinks  . Drug use: Never    Comment: +cocaine in urine 12/19/2014; pt denies this hx on 12/30/2017   Social History   Social History Narrative   Lives with her husband and her middle son.   Her two older sons are products of a previous relationship.   Her youngest son is from her current marriage.   Oldest and youngest sons live nearby.   She has total of 3 children and 6 grandchildren with 2 great-grandchildren.   She does not exercise because she has pain in her right leg.    OBJCTIVE -PE, EKG, labs   Wt Readings from Last 3 Encounters:  05/05/20 164 lb (74.4 kg)  11/11/19 153 lb (69.4 kg)  07/05/19 154 lb 6.4 oz (70 kg)    Physical Exam: BP 124/70   Pulse (!) 52   Ht _0  (1.651 m)   Wt 164 lb (74.4 kg)   SpO2 97%   BMI 27.29 kg/m  Physical Exam Constitutional:      General: She is not in acute distress.    Appearance: Normal appearance. She is normal weight. She is not ill-appearing or toxic-appearing.     Comments: Notable weight gain since last visit, but not dramatic.  Well-groomed.  HENT:     Head: Normocephalic and atraumatic.  Neck:     Vascular: No carotid bruit (Radiated aortic murmur).  Cardiovascular:     Rate and  Rhythm: Normal rate and regular rhythm.     Pulses: Normal pulses.     Heart sounds: Murmur (3/6 C-D SEM at RUSB) heard.  No friction rub. No gallop.   Pulmonary:     Effort: Pulmonary effort is normal.     Comments: Mild interstitial breath sounds, but no wheezes, rales or rhonchi. Chest:     Chest wall: No tenderness.  Musculoskeletal:        General: Swelling (1+ bilateral pitting) present. Normal range of motion.     Cervical back: Normal range of motion and neck supple.  Neurological:     General: No focal deficit present.     Mental Status: She is alert and oriented to person, place, and time.     Gait: Gait abnormal (Mild antalgic).  Psychiatric:        Mood and Affect: Mood normal.        Behavior: Behavior normal.        Thought Content: Thought content normal.        Judgment: Judgment normal.      Adult ECG Report Not checked  Recent Labs: Reviewed has not had labs in some time now.  Lab Results  Component Value Date   CHOL 137 07/23/2019   HDL 46 07/23/2019   LDLCALC 72 07/23/2019   TRIG 104 07/23/2019   CHOLHDL 3.0 07/23/2019   Lab Results  Component Value Date   CREATININE 1.07 (H) 11/11/2019   BUN 13 11/11/2019   NA 142 11/11/2019   K 4.6 11/11/2019   CL 102 11/11/2019   CO2 27 11/11/2019   CBC Latest Ref Rng & Units 11/26/2018 01/14/2018 12/31/2017  WBC 4.0 - 10.5 K/uL 9.0 8.9 10.1  Hemoglobin 12.0 - 15.0 g/dL 12.8 11.6 10.4(L)  Hematocrit 36.0 - 46.0 % 40.6 33.8(L) 33.8(L)  Platelets 150 - 400 K/uL 186 286 197    Lab Results  Component Value Date   TSH 1.620 01/14/2018    ==================================================  COVID-19 Education: The signs and symptoms of COVID-19 were discussed with the patient and how to seek care for testing (follow up with PCP or arrange E-visit).   The importance of social distancing and COVID-19 vaccination was discussed today. The patient is practicing social distancing & Masking.   I spent a total of 32  minutes with the patient spent in direct patient consultation.  Additional time spent with chart review  / charting (studies, outside notes, etc): 15 min Total Time: 52 min  Current medicines are reviewed at length with the patient today.  (+/- concerns) n/a  This visit occurred during the SARS-CoV-2 public health emergency.  Safety protocols were in place, including screening questions prior to the visit, additional usage of staff PPE, and extensive cleaning of exam room while observing appropriate contact time as indicated for disinfecting solutions.  Notice: This dictation was prepared with Dragon dictation along with smaller phrase technology. Any transcriptional errors that result from this process are unintentional and may not be corrected upon review.  Patient Instructions / Medication Changes & Studies & Tests Ordered   Patient Instructions  Medication Instructions:  Try taking Nexium 40 mg ( prescription)  if not covered then over the counter Prilosec OTC 40 mg take  daily for one month. Try taking generic Zytrec at bedtime   For 2 weeks take furosemide 3 days a week and take an extra potassium pill. After the 2 weeks, for another 2 weeks take furosemide for 2 days a week with potassium.  Weigh yourself daily  - and use  Furosemide if weight goes up 3 lbs or  Increase swelling.  Try generic Mucinex  (Guaifenesin)  For cough as needed follow direction on the package   *If you need a refill on your cardiac medications before your next appointment, please call your pharmacy*   Lab Work: Not needed   Testing/Procedures: [facility] A chest x-ray takes a picture of the organs and structures inside the chest, including the heart, lungs, and blood vessels. This test can show several things, including, whether the heart is enlarges; whether fluid is building up in the lungs; and whether pacemaker / defibrillator leads are still in place.   [facility] has requested that you have an ankle brachial index (ABI). During this test an ultrasound and blood pressure cuff are used to evaluate the arteries that supply the arms and legs with blood. Allow thirty minutes for this exam. There are no restrictions or special instructions. AND  Your physician has requested that you have a lower  extremity arterial duplex. This test is an ultrasound of the  arteries in the legs. It looks at arterial blood flow in the legs. Allow one hour for Lower Arterial scans. There are no restrictions or special instructions  [address]- May 2022 Your physician has requested that you have an echocardiogram. Echocardiography is a painless test that uses sound waves to create images of your heart. It provides your doctor with information about the size and shape of your heart and how well your heart's chambers and valves are working. This procedure takes approximately one hour. There are no restrictions for this procedure.     Follow-Up: At [facility], you and your health needs are our priority.  As part of our continuing mission to provide you with exceptional heart care, we have created designated Provider Care Teams.  These Care Teams include your primary Cardiologist (physician) and Advanced Practice Providers (APPs -  Physician Assistants and Nurse Practitioners) who all work together to provide you with the care you need, when you need it.  We recommend signing up for the patient portal called "MyChart".  Sign up information is provided on this After Visit Summary.  MyChart is used to connect with patients for Virtual Visits (Telemedicine).  Patients are able to view lab/test results, encounter notes, upcoming appointments, etc.  Non-urgent messages can be sent to your provider as well.   To learn more about what you can do with MyChart, go to [email or web address].    Your next appointment:   3 month(s)  May   The format for your next appointment:   In Person  Provider:   Glenetta Hew, MD   Other Instructions Recommend sleeping with 2 pillows at night  Discuss with primary review chest xray and your symptoms    Studies Ordered:   Orders Placed This Encounter  Procedures  . DG Chest 2 View  . ECHOCARDIOGRAM COMPLETE  . VAS [place] LOWER EXTREMITY ARTERIAL DUPLEX  . VAS [place] ABI WITH/WO TBI     Glenetta Hew, M.D., M.S. Interventional Cardiologist   Pager # [phone] Phone # [phone] [address] [place], [place] [address]   Thank you for choosing Heartcare at [facility]!!

## 2020-05-05 NOTE — Patient Instructions (Signed)
Medication Instructions:  Try taking Nexium 40 mg ( prescription)  if not covered then over the counter Prilosec OTC 40 mg take  daily for one month. Try taking generic Zytrec at bedtime   For 2 weeks take furosemide 3 days a week and take an extra potassium pill. After the 2 weeks, for another 2 weeks take furosemide for 2 days a week with potassium.  Weigh yourself daily  - and use  Furosemide if weight goes up 3 lbs or  Increase swelling.  Try generic Mucinex  (Guaifenesin)  For cough as needed follow direction on the package   *If you need a refill on your cardiac medications before your next appointment, please call your pharmacy*   Lab Work: Not needed   Testing/Procedures: [place] A chest x-ray takes a picture of the organs and structures inside the chest, including the heart, lungs, and blood vessels. This test can show several things, including, whether the heart is enlarges; whether fluid is building up in the lungs; and whether pacemaker / defibrillator leads are still in place.   [place] has requested that you have an ankle brachial index (ABI). During this test an ultrasound and blood pressure cuff are used to evaluate the arteries that supply the arms and legs with blood. Allow thirty minutes for this exam. There are no restrictions or special instructions. AND  Your physician has requested that you have a lower  extremity arterial duplex. This test is an ultrasound of the arteries in the legs. It looks at arterial blood flow in the legs. Allow one hour for Lower Arterial scans. There are no restrictions or special instructions  [address]- May 2022 Your physician has requested that you have an echocardiogram. Echocardiography is a painless test that uses sound waves to create images of your heart. It provides your doctor with information about the size and shape of your  heart and how well your heart's chambers and valves are working. This procedure takes approximately one hour. There are no restrictions for this procedure.     Follow-Up: At [facility], you and your health needs are our priority.  As part of our continuing mission to provide you with exceptional heart care, we have created designated Provider Care Teams.  These Care Teams include your primary Cardiologist (physician) and Advanced Practice Providers (APPs -  Physician Assistants and Nurse Practitioners) who all work together to provide you with the care you need, when you need it.  We recommend signing up for the patient portal called "MyChart".  Sign up information is provided on this After Visit Summary.  MyChart is used to connect with patients for Virtual Visits (Telemedicine).  Patients are able to view lab/test results, encounter notes, upcoming appointments, etc.  Non-urgent messages can be sent to your provider as well.   To learn more about what you can do with MyChart, go to [email or web address].    Your next appointment:   3 month(s) May   The format for your next appointment:   In Person  Provider:   Glenetta Hew, MD   Other Instructions Recommend sleeping with 2 pillows at night  Discuss with primary review chest xray and your symptoms

## 2020-05-06 ENCOUNTER — Encounter: Payer: Self-pay | Admitting: Cardiology

## 2020-05-06 NOTE — Assessment & Plan Note (Signed)
Was supposed to have an echo done last year.  We will schedule recheck echo in May.

## 2020-05-06 NOTE — Assessment & Plan Note (Addendum)
No significant disease noted.  TAVR cath.  She is on low-dose rosuvastatin along with aspirin.  She also is on beta-blocker, and amlodipine.

## 2020-05-06 NOTE — Assessment & Plan Note (Signed)
She still smoking, I try to encourage her to quit smoking only.  This is clearly mainly reason for cough.  However the change in her cough is what is concerning.  She may need pulmonary evaluation.  Recommend that she talk with her PCP or pulmonologist-she may be due for a follow-up CT scan of her chest.

## 2020-05-06 NOTE — Assessment & Plan Note (Signed)
Leg pain in November the sense that she has high risk ABIs. Question if this is claudication or not.  Plan: Check LEA Dopplers.

## 2020-05-06 NOTE — Assessment & Plan Note (Addendum)
Blood pressure looks good.  She is on losartan, amlodipine and combination bisoprolol/HCTZ. Currently for medications to maintain stable blood pressure.

## 2020-05-06 NOTE — Assessment & Plan Note (Signed)
Multiple different potential etiologies for cough.  She has COPD, but also sounds like she may have symptoms of GERD.  Multifactorial: Check 2D echo and chest x-ray With possible GERD as etiology, asked her to start Nexium 40 mg or Prilosec OTC 40 mg daily. Add Zyrtec 10 mg nightly along with Mucinex with her Robitussin.

## 2020-05-06 NOTE — Assessment & Plan Note (Signed)
No symptoms on current dose of beta-blocker.

## 2020-05-06 NOTE — Assessment & Plan Note (Signed)
Underlying lung disease along with valvular heart disease the diastolic function. Regard to some of this would have improved with TAVR.  She is due to recheck echocardiogram.

## 2020-05-06 NOTE — Assessment & Plan Note (Signed)
Chief does have some HFpEF with high blood pressure and aortic valve disease. Now noticing what sounds may be orthopnea, but it has morbid cough and rattling sound.  Plan: Check chest x-ray to ensure no pneumonia or bronchitis. Check 2D echo Add furosemide as a more standing medication: For 2 weeks take furosemide 3 days a week and take an extra potassium pill. After the 2 weeks, for another 2 weeks take furosemide for 2 days a week with potassium.  Weigh yourself daily  - and use  Furosemide if weight goes up 3 lbs or  Increase swelling.

## 2020-05-16 ENCOUNTER — Ambulatory Visit (HOSPITAL_COMMUNITY)
Admission: RE | Admit: 2020-05-16 | Discharge: 2020-05-16 | Disposition: A | Discharge status: Home / Self Care | Payer: Medicare HMO | Source: Ambulatory Visit | Attending: Cardiovascular Disease | Admitting: Cardiovascular Disease

## 2020-05-16 ENCOUNTER — Other Ambulatory Visit: Payer: Self-pay

## 2020-05-16 DIAGNOSIS — M79605 Pain in left leg: Secondary | ICD-10-CM

## 2020-05-16 DIAGNOSIS — R6889 Other general symptoms and signs: Secondary | ICD-10-CM | POA: Diagnosis present

## 2020-05-16 DIAGNOSIS — M79604 Pain in right leg: Secondary | ICD-10-CM | POA: Diagnosis present

## 2020-05-22 ENCOUNTER — Telehealth: Payer: Self-pay | Admitting: Cardiology

## 2020-05-22 NOTE — Telephone Encounter (Signed)
Patient returning call for vascular test results. 

## 2020-05-22 NOTE — Telephone Encounter (Signed)
Spoke with pt, aware of abnormal dopplers. Follow up scheduled 06-06-20 with dr Fletcher Anon.

## 2020-06-06 ENCOUNTER — Institutional Professional Consult (permissible substitution): Payer: Medicare HMO | Admitting: Cardiovascular Disease

## 2020-06-23 DIAGNOSIS — I701 Atherosclerosis of renal artery: Secondary | ICD-10-CM

## 2020-06-23 DIAGNOSIS — K551 Chronic vascular disorders of intestine: Secondary | ICD-10-CM

## 2020-06-23 DIAGNOSIS — Q6102 Congenital multiple renal cysts: Secondary | ICD-10-CM

## 2020-06-23 DIAGNOSIS — I779 Disorder of arteries and arterioles, unspecified: Secondary | ICD-10-CM

## 2020-06-23 HISTORY — DX: Disorder of arteries and arterioles, unspecified: I77.9

## 2020-06-23 HISTORY — DX: Congenital multiple renal cysts: Q61.02

## 2020-06-23 HISTORY — DX: Chronic vascular disorders of intestine: K55.1

## 2020-06-23 HISTORY — DX: Atherosclerosis of renal artery: I70.1

## 2020-06-27 ENCOUNTER — Encounter: Payer: Self-pay | Admitting: Cardiovascular Disease

## 2020-06-27 ENCOUNTER — Other Ambulatory Visit: Payer: Self-pay

## 2020-06-27 ENCOUNTER — Ambulatory Visit: Payer: Medicare HMO | Admitting: Cardiovascular Disease

## 2020-06-27 VITALS — BP 110/76 | HR 53 | Ht 65.0 in | Wt 165.6 lb

## 2020-06-27 DIAGNOSIS — Z01812 Encounter for preprocedural laboratory examination: Secondary | ICD-10-CM

## 2020-06-27 DIAGNOSIS — Z720 Tobacco use: Secondary | ICD-10-CM

## 2020-06-27 DIAGNOSIS — I2510 Atherosclerotic heart disease of native coronary artery without angina pectoris: Secondary | ICD-10-CM | POA: Diagnosis not present

## 2020-06-27 DIAGNOSIS — I10 Essential (primary) hypertension: Secondary | ICD-10-CM

## 2020-06-27 DIAGNOSIS — E782 Mixed hyperlipidemia: Secondary | ICD-10-CM

## 2020-06-27 DIAGNOSIS — I739 Peripheral vascular disease, unspecified: Secondary | ICD-10-CM

## 2020-06-27 NOTE — Patient Instructions (Signed)
Medication Instructions:  No changes  *If you need a refill on your cardiac medications before your next appointment, please call your pharmacy*  Testing/Procedures: Your physician has requested that you have a peripheral vascular angiogram. This exam is performed at the hospital. During this exam IV contrast is used to look at arterial blood flow. Please review the information sheet given for details.     Follow-Up: At [facility], you and your health needs are our priority.  As part of our continuing mission to provide you with exceptional heart care, we have created designated Provider Care Teams.  These Care Teams include your primary Cardiologist (physician) and Advanced Practice Providers (APPs -  Physician Assistants and Nurse Practitioners) who all work together to provide you with the care you need, when you need it.  We recommend signing up for the patient portal called "MyChart".  Sign up information is provided on this After Visit Summary.  MyChart is used to connect with patients for Virtual Visits (Telemedicine).  Patients are able to view lab/test results, encounter notes, upcoming appointments, etc.  Non-urgent messages can be sent to your provider as well.   To learn more about what you can do with MyChart, go to [email or web address].    Your next appointment:   Keep your post-procedure follow-up with [name] on May 17 at 10:20am.    Alexandra [name] [id number] Dept: [phone] Loc: [place]  06/27/2020  You are scheduled for a Peripheral Angiogram on Wednesday, April 20 with Dr. [name].  1. Please arrive at the [facility] (Main Entrance A) at [facility]: [address] [place], [place] [address] at 6:30 AM (This time is two hours before your procedure to ensure your preparation). Free valet parking service is  available.   Special note: Every effort is made to have your procedure done on time. Please understand that emergencies sometimes delay scheduled procedures.  2. Diet: Do not eat solid foods after midnight.  The patient may have clear liquids until 5am upon the day of the procedure.  3. Labs: You will need to have blood drawn today.  You will need a Covid test on Monday, April 18 at 9:40am. This is a drive up test only. The drive up test is at [address], [place].   4. Medication instructions in preparation for your procedure: Hold bisoprolol-hydrochlorothiazide the morning of the procedure.  Hold furosemide and potassium the morning of the procedure.     On the morning of your procedure, take your Aspirin and any morning medicines NOT listed above.  You may use sips of water.  5. Plan for one night stay--bring personal belongings. 6. Bring a current list of your medications and current insurance cards. 7. You MUST have a responsible person to drive you home. 8. Someone MUST be with you the first 24 hours after you arrive home or your discharge will be delayed. 9. Please wear clothes that are easy to get on and off and wear slip-on shoes.  Thank you for allowing [place] to care for you!   -- [facility]

## 2020-06-27 NOTE — Progress Notes (Signed)
Cardiology Office Note   Date:  06/27/2020   ID:  Alexandra [name], DOB Jan 20, 1951, MRN [id number]  PCP:  [facility]  Cardiologist: Dr. Ellyn Hack  No chief complaint on file.     History of Present Illness: Alexandra [name] is a 70 y.o. female who was referred by Dr. Ellyn Hack for evaluation and management of peripheral arterial disease.  She has known history of coronary artery disease, aortic valve disease status post TAVR, COPD, tobacco use, chronic diastolic heart failure, resistant hypertension and pulmonary hypertension.  She reports previous injuries and surgeries to both legs with chronic pain.  She noticed severe bilateral calf claudication over the last few years that has gradually worsened especially on the right side.  She can only walk to her mailbox and then has to rest on the way back because of the discomfort.  In addition, she started having spasm in both legs especially on the right side at night.  No lower extremity ulceration.  No chest pain or shortness of breath.  She had recent noninvasive vascular studies in February which showed an ABI of 0.49 on the right and 0.57 on the left.  Duplex showed severe stenosis in the distal right common iliac artery with occluded SFA and occluded right posterior tibial artery.  On the left side, there was moderate iliac disease with occluded SFA and posterior tibial artery.  Past Medical History:  Diagnosis Date  . Arthritis   . COPD (chronic obstructive pulmonary disease) ([place]) 05/22/2016   Severely decreased DLCO by PFTs in April 2019 but only mild obstructive disease.   . Coronary artery disease, non-occlusive 11/28/2016   R&LHC: 65% distal LAD lesion -- likely not angiographically significant->medical management. Normal LVEF. Moderately elevated LVEDP. I aortic valve gradient in the Cath Lab, moderate aortic stenosis. This would suggest that the stenosis is on the moderate side of moderate to severe.   Severe pulmonary hypertension (likely mixed).  AVA 0.96 cm; P-P gradient ~ 30 mmHg, Mean ~24.5-27.5 (Moderate AS)   . GERD (gastroesophageal reflux disease)   . Heart murmur   . Hx of adenomatous colonic polyps 10/19/2015  . Hyperlipidemia   . Hypertension   . Moderate mitral regurgitation by prior echocardiography 10/2016   Echocardiogram revealed moderate mitral regurgitation; most recent Echo 02/12/2019- MAC with moderate MR  . Pulmonary hypertension associated Aortic Stenosis, Mitral Regurgitation & COPD    Peak PAP by Echo ~54 mmHg;  by Cath PAP/Mean 71/32 mmHg, 53 mmHg (with PCWP & LVEDP ~24-26 mmHg); TPG ~26 mmHg  . Pulmonary nodules   . S/P TAVR (transcatheter aortic valve replacement) 12/30/2017   Edward's Sapien 3 bioprosthetic THB via TF approach  . Severe aortic stenosis 2018   Underwent TAVR in 2019    Past Surgical History:  Procedure Laterality Date  . APPENDECTOMY    . BREAST CYST EXCISION Right 1970  . CARDIAC VALVE REPLACEMENT    . CATARACT EXTRACTION W/ INTRAOCULAR LENS IMPLANT Right   . CHOLECYSTECTOMY OPEN    . COLONOSCOPY  2003, 2017  . CTA Chest  09/2016   Thoracic Aortic Ca2+ w/o dilation.  Coronary Calcification noted. PA normal. Mild Emphysematous changes w/ mild LLL scarring.    [name] FRACTURE SURGERY     2007 -right tib /fib fracture ----07/2009 right femur fx after a fall  . FRACTURE SURGERY Right 2011   Femur  . PERIPHERAL VASCULAR CATHETERIZATION N/A 08/09/2014   Procedure: Aortic Arch Angiography;  Surgeon: Durene Fruits  Pierre Bali, MD;  Location: [place] [facility];  Service: Cardiovascular: Type 1 Arch. No significant stenosis, aneurysmal degeneration or dissection.  No luminal irregularity seen in R or L SubClavian, Brachial or Radial A. Chronic distal ulnar A occlusion [place].  [name] PERIPHERAL VASCULAR CATHETERIZATION Right 08/09/2014   Procedure: Upper Extremity Angiography;  Surgeon: Serafina Mitchell, MD;  Location: [place] [facility];  Service: Cardiovascular;   Laterality: Right;  . RIGHT/LEFT HEART CATH AND CORONARY ANGIOGRAPHY N/A 12/06/2016   Procedure: RIGHT/LEFT HEART CATH AND CORONARY ANGIOGRAPHY;  Surgeon: Leonie Man, MD;  Location: [facility]: Cor Angio: 65% dLAD (Med Rx). AVA 0.96 cm; P-P gradient ~ 30 mmHg, Mean ~24.5-27.5 (Mod AS); RHC #s: RAP 8 mmHg, RVP/EDP: 71/8/15 mmHg, PCWP: 21-24 mmHg, PAP/mean: 71/32/52 mmHg = Severe Mixed Pulmonary HTN; LVP/EDP 205/17/26 mmHg ; CO/CI by Fick: 4.35, 2.44 - mildly reduced  . RIGHT/LEFT HEART CATH AND CORONARY ANGIOGRAPHY N/A 11/20/2017   Procedure: RIGHT/LEFT HEART CATH AND CORONARY ANGIOGRAPHY;  Surgeon: Larey Dresser, MD;  Location: [place] [facility];  Service: Cardiovascular;  Laterality: N/A;  . TEE WITHOUT CARDIOVERSION N/A 12/30/2017   Procedure: (Intra-Op) TRANSESOPHAGEAL ECHOCARDIOGRAM (TEE);  Surgeon: Burnell Blanks, MD;  Location: [facility];  Service: Open Heart Surgery; Post TAVR good position of 23 mm AP and 3 valve.  No new or WMA.  EF 55-60%.  No effusion.  Mild perivalvular regurgitation seen.  Mean gradient 7 mmHg.  Peak 16 mmHg.  [name] TOTAL KNEE ARTHROPLASTY Left 12/01/2018   Procedure: Left Knee Arthroplasty;  Surgeon: Melrose Nakayama, MD;  Location: [facility];  Service: Orthopedics;  Laterality: Left;  . TRANSCATHETER AORTIC VALVE REPLACEMENT, TRANSFEMORAL  12/30/2017  . TRANSCATHETER AORTIC VALVE REPLACEMENT, TRANSFEMORAL N/A 12/30/2017   Procedure: TRANSCATHETER AORTIC VALVE REPLACEMENT, TRANSFEMORAL;  Surgeon: Burnell Blanks, MD;  Location: [facility];  Service: Open Heart Surgery;  Laterality: N/A;  . TRANSTHORACIC ECHOCARDIOGRAM  10/30/2016   Mild Concentric LVH. EF 55-60%. No RWMA, Gr 2 DD. Mod-Severe AS (mean-peak Gradient 31 mmHG - 64 mmHg), Mod MR. Mod LA dilation, Mod PA HTN - peak pressure ~54 mmHg). = Progression of AS from 2014  . TRANSTHORACIC ECHOCARDIOGRAM  01/2019   EF 55 to 60%.  Elevated LVEDP.  GR 1 DD.  No R WMA.  Moderate LA dilation.  Well-seated  stented bioprosthetic aortic valve.  Mean gradient 13 mercury.  Moderate MAC moderate MR.  No evidence of mildly elevated RAP/CVP and moderately elevated PA P  . TRANSTHORACIC ECHOCARDIOGRAM  10/2017   a) Pre-TAVR 10/31/17: EF 55 to 60%.  GR 1 DD.  Calcific severe AS.  Peak Grad 62 mmHg, mean 32 mmHg.  Progression of Dz.  AVA ~0.8 cm.; b) Post TAVR 10/9/'19: EF 60-65%.  GR 1 DD.  Stable appearing 23 mm Edwards's Sapien AV bioprosthesis.  No perivalvular AI.  Mean Grad 22 mmHg; c) 11/13/'19: AoV mean Grad 19 mmHg,, Peak 36 mmHg. PAP ~33 mmHg  . ULTRASOUND GUIDANCE FOR VASCULAR ACCESS  08/09/2014   Procedure: Ultrasound Guidance For Vascular Access;  Surgeon: Serafina Mitchell, MD;  Location: [place] [facility];  Service: Cardiovascular;;     Current Outpatient Medications  Medication Sig Dispense Refill  . amLODipine (NORVASC) 10 MG tablet Take by mouth.    [name] aspirin 81 MG tablet Take 1 tablet (81 mg total) by mouth 2 (two) times daily at 10 AM and 5 PM. 30 tablet 0  . Azelastine HCl 0.15 % SOLN  two sprays by Both Nostrils route 2 (two) times daily.    . bisoprolol-hydrochlorothiazide (ZIAC) 10-6.25 MG tablet TAKE 2 TABLETS BY MOUTH DAILY 60 tablet 6  . cetirizine (ZYRTEC) 10 MG tablet Take 1 tablet (10 mg total) by mouth daily. 30 tablet 11  . Cholecalciferol (VITAMIN D) 50 MCG (2000 UT) CAPS Take 2 capsules by mouth daily.    . diclofenac Sodium (VOLTAREN) 1 % GEL APP 4 GRAMS EXT AA UP TO TID PRN    . esomeprazole (NEXIUM) 40 MG capsule Take 1 capsule (40 mg total) by mouth daily at 12 noon. 90 capsule 2  . furosemide (LASIX) 20 MG tablet Take 1 tablet (20 mg total) by mouth as needed. For increase  swelling 30 tablet 6  . guaiFENesin (MUCINEX) 600 MG 12 hr tablet Take 1 tablet (600 mg total) by mouth 2 (two) times daily. 60 tablet 11  . losartan (COZAAR) 100 MG tablet Take by mouth.    . oxyCODONE-acetaminophen (PERCOCET) 10-325 MG tablet Take 1 tablet by mouth daily as needed.    . potassium  chloride SA (KLOR-CON) 20 MEQ tablet Take 1 tablet (20 mEq total) by mouth daily. 90 tablet 3  . rosuvastatin (CRESTOR) 10 MG tablet Take by mouth.    [name] tiZANidine (ZANAFLEX) 4 MG tablet Take 4 mg by mouth at bedtime.     No current facility-administered medications for this visit.    Allergies:   Codeine and Symproic [naldemedine]    Social History:  The patient  reports that she has been smoking cigarettes. She has a 4.00 pack-year smoking history. She has never used smokeless tobacco. She reports previous alcohol use. She reports that she does not use drugs.   Family History:  The patient's family history includes Heart attack in her brother; Heart disease in her father and mother; Hypertension in her mother.    ROS:  Please see the history of present illness.   Otherwise, review of systems are positive for none.   All other systems are reviewed and negative.    PHYSICAL EXAM: VS:  BP 110/76   Pulse (!) 53   Ht _0  (1.651 m)   Wt 165 lb 9.6 oz (75.1 kg)   BMI 27.56 kg/m  , BMI Body mass index is 27.56 kg/m. GEN: Well nourished, well developed, in no acute distress  HEENT: normal  Neck: no JVD, bilateral carotid bruits, or masses Cardiac: RRR; no  rubs, or gallops, 2 out of 6 systolic murmur in the aortic area.  Mild bilateral leg edema Respiratory:  clear to auscultation bilaterally, normal work of breathing GI: soft, nontender, nondistended, + BS MS: no deformity or atrophy  Skin: warm and dry, no rash Neuro:  Strength and sensation are intact Psych: euthymic mood, full affect Vascular: Femoral pulses +1 on the right and +2 on the left.  Distal pulses are not palpable.   EKG:  EKG is ordered today. The ekg ordered today demonstrates sinus bradycardia with left bundle branch block.   Recent Labs: 07/05/2019: BNP 279.9 07/23/2019: ALT 7 11/11/2019: BUN 13; Creatinine, Ser 1.07; Potassium 4.6; Sodium 142    Lipid Panel    Component Value Date/Time   CHOL 137  07/23/2019 1001   TRIG 104 07/23/2019 1001   HDL 46 07/23/2019 1001   CHOLHDL 3.0 07/23/2019 1001   CHOLHDL 3.2 03/05/2018 1135   VLDL 22 03/05/2018 1135   LDLCALC 72 07/23/2019 1001      Wt Readings from Last 3  Encounters:  06/27/20 165 lb 9.6 oz (75.1 kg)  05/05/20 164 lb (74.4 kg)  11/11/19 153 lb (69.4 kg)       PAD Screen 11/28/2016  Previous PAD dx? No  Previous surgical procedure? No  Pain with walking? Yes  Subsides with rest? Yes  Feet/toe relief with dangling? No  Painful, non-healing ulcers? No  Extremities discolored? No      ASSESSMENT AND PLAN:  1.  Peripheral arterial disease with severe bilateral leg claudication worse on the right side with some rest pain at night.  Her symptoms are definitely worse on the right side on the left side.  She has chronically occluded SFAs bilaterally.  In addition, she has high-grade stenosis in the right common iliac artery which is the likely explanation of why her symptoms are worse on the right side than the left.  I discussed with her the natural history and management of peripheral arterial disease.  I suspect that the SFAs have been occluded for a long time with low chance of success with endovascular intervention and long-term patency.  Her right iliac disease is approachable and can likely improve her symptoms on the right side.  Given severity of her symptoms, I recommend proceeding with abdominal aortogram with lower extremity runoff and possible endovascular intervention.  Planned access is via the right common femoral artery.  She is no longer on clopidogrel and I explained to her the need for dual antiplatelet therapy for at least 1 month. The plan is to treat her chronically occluded SFAs medically and we could consider adding cilostazol down the road.  2.  Coronary artery disease involving native coronary artery without angina: She seems to be doing well from a cardiac standpoint.  Continue medical therapy.  3.  Status  post TAVR: Stable.  4.  Essential hypertension: Blood pressure is controlled.  5.  Hyperlipidemia: Continue treatment with rosuvastatin.  Most recent lipid profile showed an LDL of 72.  6.  Tobacco use: She cut down to 2 cigarettes a day.  I explained to her the importance of complete cessation.    Disposition:   FU with me in 1 month  Signed,  [name], MD  06/27/2020 1:41 PM    [facility]

## 2020-06-27 NOTE — H&P (View-Only) (Signed)
Cardiology Office Note   Date:  06/27/2020   ID:  Alexandra [name], DOB January 19, 1951, MRN [id number]  PCP:  [facility]  Cardiologist: Dr. Ellyn Hack  No chief complaint on file.     History of Present Illness: Alexandra [name] is a 70 y.o. female who was referred by Dr. Ellyn Hack for evaluation and management of peripheral arterial disease.  She has known history of coronary artery disease, aortic valve disease status post TAVR, COPD, tobacco use, chronic diastolic heart failure, resistant hypertension and pulmonary hypertension.  She reports previous injuries and surgeries to both legs with chronic pain.  She noticed severe bilateral calf claudication over the last few years that has gradually worsened especially on the right side.  She can only walk to her mailbox and then has to rest on the way back because of the discomfort.  In addition, she started having spasm in both legs especially on the right side at night.  No lower extremity ulceration.  No chest pain or shortness of breath.  She had recent noninvasive vascular studies in February which showed an ABI of 0.49 on the right and 0.57 on the left.  Duplex showed severe stenosis in the distal right common iliac artery with occluded SFA and occluded right posterior tibial artery.  On the left side, there was moderate iliac disease with occluded SFA and posterior tibial artery.  Past Medical History:  Diagnosis Date  . Arthritis   . COPD (chronic obstructive pulmonary disease) ([place]) 05/22/2016   Severely decreased DLCO by PFTs in April 2019 but only mild obstructive disease.   . Coronary artery disease, non-occlusive 11/28/2016   R&LHC: 65% distal LAD lesion -- likely not angiographically significant->medical management. Normal LVEF. Moderately elevated LVEDP. I aortic valve gradient in the Cath Lab, moderate aortic stenosis. This would suggest that the stenosis is on the moderate side of moderate to severe.   Severe pulmonary hypertension (likely mixed).  AVA 0.96 cm; P-P gradient ~ 30 mmHg, Mean ~24.5-27.5 (Moderate AS)   . GERD (gastroesophageal reflux disease)   . Heart murmur   . Hx of adenomatous colonic polyps 10/19/2015  . Hyperlipidemia   . Hypertension   . Moderate mitral regurgitation by prior echocardiography 10/2016   Echocardiogram revealed moderate mitral regurgitation; most recent Echo 02/12/2019- MAC with moderate MR  . Pulmonary hypertension associated Aortic Stenosis, Mitral Regurgitation & COPD    Peak PAP by Echo ~54 mmHg;  by Cath PAP/Mean 71/32 mmHg, 53 mmHg (with PCWP & LVEDP ~24-26 mmHg); TPG ~26 mmHg  . Pulmonary nodules   . S/P TAVR (transcatheter aortic valve replacement) 12/30/2017   Edward's Sapien 3 bioprosthetic THB via TF approach  . Severe aortic stenosis 2018   Underwent TAVR in 2019    Past Surgical History:  Procedure Laterality Date  . APPENDECTOMY    . BREAST CYST EXCISION Right 1970  . CARDIAC VALVE REPLACEMENT    . CATARACT EXTRACTION W/ INTRAOCULAR LENS IMPLANT Right   . CHOLECYSTECTOMY OPEN    . COLONOSCOPY  2003, 2017  . CTA Chest  09/2016   Thoracic Aortic Ca2+ w/o dilation.  Coronary Calcification noted. PA normal. Mild Emphysematous changes w/ mild LLL scarring.    [name] FRACTURE SURGERY     2007 -right tib /fib fracture ----07/2009 right femur fx after a fall  . FRACTURE SURGERY Right 2011   Femur  . PERIPHERAL VASCULAR CATHETERIZATION N/A 08/09/2014   Procedure: Aortic Arch Angiography;  Surgeon: Durene Fruits  Pierre Bali, MD;  Location: [facility];  Service: Cardiovascular: Type 1 Arch. No significant stenosis, aneurysmal degeneration or dissection.  No luminal irregularity seen in R or L SubClavian, Brachial or Radial A. Chronic distal ulnar A occlusion [place].  [name] PERIPHERAL VASCULAR CATHETERIZATION Right 08/09/2014   Procedure: Upper Extremity Angiography;  Surgeon: Serafina Mitchell, MD;  Location: [facility];  Service: Cardiovascular;   Laterality: Right;  . RIGHT/LEFT HEART CATH AND CORONARY ANGIOGRAPHY N/A 12/06/2016   Procedure: RIGHT/LEFT HEART CATH AND CORONARY ANGIOGRAPHY;  Surgeon: Leonie Man, MD;  Location: [facility]: Cor Angio: 65% dLAD (Med Rx). AVA 0.96 cm; P-P gradient ~ 30 mmHg, Mean ~24.5-27.5 (Mod AS); RHC #s: RAP 8 mmHg, RVP/EDP: 71/8/15 mmHg, PCWP: 21-24 mmHg, PAP/mean: 71/32/52 mmHg = Severe Mixed Pulmonary HTN; LVP/EDP 205/17/26 mmHg ; CO/CI by Fick: 4.35, 2.44 - mildly reduced  . RIGHT/LEFT HEART CATH AND CORONARY ANGIOGRAPHY N/A 11/20/2017   Procedure: RIGHT/LEFT HEART CATH AND CORONARY ANGIOGRAPHY;  Surgeon: Larey Dresser, MD;  Location: [facility];  Service: Cardiovascular;  Laterality: N/A;  . TEE WITHOUT CARDIOVERSION N/A 12/30/2017   Procedure: (Intra-Op) TRANSESOPHAGEAL ECHOCARDIOGRAM (TEE);  Surgeon: Burnell Blanks, MD;  Location: [facility];  Service: Open Heart Surgery; Post TAVR good position of 23 mm AP and 3 valve.  No new or WMA.  EF 55-60%.  No effusion.  Mild perivalvular regurgitation seen.  Mean gradient 7 mmHg.  Peak 16 mmHg.  [name] TOTAL KNEE ARTHROPLASTY Left 12/01/2018   Procedure: Left Knee Arthroplasty;  Surgeon: Melrose Nakayama, MD;  Location: [facility];  Service: Orthopedics;  Laterality: Left;  . TRANSCATHETER AORTIC VALVE REPLACEMENT, TRANSFEMORAL  12/30/2017  . TRANSCATHETER AORTIC VALVE REPLACEMENT, TRANSFEMORAL N/A 12/30/2017   Procedure: TRANSCATHETER AORTIC VALVE REPLACEMENT, TRANSFEMORAL;  Surgeon: Burnell Blanks, MD;  Location: [facility];  Service: Open Heart Surgery;  Laterality: N/A;  . TRANSTHORACIC ECHOCARDIOGRAM  10/30/2016   Mild Concentric LVH. EF 55-60%. No RWMA, Gr 2 DD. Mod-Severe AS (mean-peak Gradient 31 mmHG - 64 mmHg), Mod MR. Mod LA dilation, Mod PA HTN - peak pressure ~54 mmHg). = Progression of AS from 2014  . TRANSTHORACIC ECHOCARDIOGRAM  01/2019   EF 55 to 60%.  Elevated LVEDP.  GR 1 DD.  No R WMA.  Moderate LA dilation.  Well-seated  stented bioprosthetic aortic valve.  Mean gradient 13 mercury.  Moderate MAC moderate MR.  No evidence of mildly elevated RAP/CVP and moderately elevated PA P  . TRANSTHORACIC ECHOCARDIOGRAM  10/2017   a) Pre-TAVR 10/31/17: EF 55 to 60%.  GR 1 DD.  Calcific severe AS.  Peak Grad 62 mmHg, mean 32 mmHg.  Progression of Dz.  AVA ~0.8 cm.; b) Post TAVR 10/9/'19: EF 60-65%.  GR 1 DD.  Stable appearing 23 mm Edwards's Sapien AV bioprosthesis.  No perivalvular AI.  Mean Grad 22 mmHg; c) 11/13/'19: AoV mean Grad 19 mmHg,, Peak 36 mmHg. PAP ~33 mmHg  . ULTRASOUND GUIDANCE FOR VASCULAR ACCESS  08/09/2014   Procedure: Ultrasound Guidance For Vascular Access;  Surgeon: Serafina Mitchell, MD;  Location: [facility];  Service: Cardiovascular;;     Current Outpatient Medications  Medication Sig Dispense Refill  . amLODipine (NORVASC) 10 MG tablet Take by mouth.    [name] aspirin 81 MG tablet Take 1 tablet (81 mg total) by mouth 2 (two) times daily at 10 AM and 5 PM. 30 tablet 0  . Azelastine HCl 0.15 % SOLN  two sprays by Both Nostrils route 2 (two) times daily.    . bisoprolol-hydrochlorothiazide (ZIAC) 10-6.25 MG tablet TAKE 2 TABLETS BY MOUTH DAILY 60 tablet 6  . cetirizine (ZYRTEC) 10 MG tablet Take 1 tablet (10 mg total) by mouth daily. 30 tablet 11  . Cholecalciferol (VITAMIN D) 50 MCG (2000 UT) CAPS Take 2 capsules by mouth daily.    . diclofenac Sodium (VOLTAREN) 1 % GEL APP 4 GRAMS EXT AA UP TO TID PRN    . esomeprazole (NEXIUM) 40 MG capsule Take 1 capsule (40 mg total) by mouth daily at 12 noon. 90 capsule 2  . furosemide (LASIX) 20 MG tablet Take 1 tablet (20 mg total) by mouth as needed. For increase  swelling 30 tablet 6  . guaiFENesin (MUCINEX) 600 MG 12 hr tablet Take 1 tablet (600 mg total) by mouth 2 (two) times daily. 60 tablet 11  . losartan (COZAAR) 100 MG tablet Take by mouth.    . oxyCODONE-acetaminophen (PERCOCET) 10-325 MG tablet Take 1 tablet by mouth daily as needed.    . potassium  chloride SA (KLOR-CON) 20 MEQ tablet Take 1 tablet (20 mEq total) by mouth daily. 90 tablet 3  . rosuvastatin (CRESTOR) 10 MG tablet Take by mouth.    [name] tiZANidine (ZANAFLEX) 4 MG tablet Take 4 mg by mouth at bedtime.     No current facility-administered medications for this visit.    Allergies:   Codeine and Symproic [naldemedine]    Social History:  The patient  reports that she has been smoking cigarettes. She has a 4.00 pack-year smoking history. She has never used smokeless tobacco. She reports previous alcohol use. She reports that she does not use drugs.   Family History:  The patient's family history includes Heart attack in her brother; Heart disease in her father and mother; Hypertension in her mother.    ROS:  Please see the history of present illness.   Otherwise, review of systems are positive for none.   All other systems are reviewed and negative.    PHYSICAL EXAM: VS:  BP 110/76   Pulse (!) 53   Ht _0  (1.651 m)   Wt 165 lb 9.6 oz (75.1 kg)   BMI 27.56 kg/m  , BMI Body mass index is 27.56 kg/m. GEN: Well nourished, well developed, in no acute distress  HEENT: normal  Neck: no JVD, bilateral carotid bruits, or masses Cardiac: RRR; no  rubs, or gallops, 2 out of 6 systolic murmur in the aortic area.  Mild bilateral leg edema Respiratory:  clear to auscultation bilaterally, normal work of breathing GI: soft, nontender, nondistended, + BS MS: no deformity or atrophy  Skin: warm and dry, no rash Neuro:  Strength and sensation are intact Psych: euthymic mood, full affect Vascular: Femoral pulses +1 on the right and +2 on the left.  Distal pulses are not palpable.   EKG:  EKG is ordered today. The ekg ordered today demonstrates sinus bradycardia with left bundle branch block.   Recent Labs: 07/05/2019: BNP 279.9 07/23/2019: ALT 7 11/11/2019: BUN 13; Creatinine, Ser 1.07; Potassium 4.6; Sodium 142    Lipid Panel    Component Value Date/Time   CHOL 137  07/23/2019 1001   TRIG 104 07/23/2019 1001   HDL 46 07/23/2019 1001   CHOLHDL 3.0 07/23/2019 1001   CHOLHDL 3.2 03/05/2018 1135   VLDL 22 03/05/2018 1135   LDLCALC 72 07/23/2019 1001      Wt Readings from Last 3  Encounters:  06/27/20 165 lb 9.6 oz (75.1 kg)  05/05/20 164 lb (74.4 kg)  11/11/19 153 lb (69.4 kg)       PAD Screen 11/28/2016  Previous PAD dx? No  Previous surgical procedure? No  Pain with walking? Yes  Subsides with rest? Yes  Feet/toe relief with dangling? No  Painful, non-healing ulcers? No  Extremities discolored? No      ASSESSMENT AND PLAN:  1.  Peripheral arterial disease with severe bilateral leg claudication worse on the right side with some rest pain at night.  Her symptoms are definitely worse on the right side on the left side.  She has chronically occluded SFAs bilaterally.  In addition, she has high-grade stenosis in the right common iliac artery which is the likely explanation of why her symptoms are worse on the right side than the left.  I discussed with her the natural history and management of peripheral arterial disease.  I suspect that the SFAs have been occluded for a long time with low chance of success with endovascular intervention and long-term patency.  Her right iliac disease is approachable and can likely improve her symptoms on the right side.  Given severity of her symptoms, I recommend proceeding with abdominal aortogram with lower extremity runoff and possible endovascular intervention.  Planned access is via the right common femoral artery.  She is no longer on clopidogrel and I explained to her the need for dual antiplatelet therapy for at least 1 month. The plan is to treat her chronically occluded SFAs medically and we could consider adding cilostazol down the road.  2.  Coronary artery disease involving native coronary artery without angina: She seems to be doing well from a cardiac standpoint.  Continue medical therapy.  3.  Status  post TAVR: Stable.  4.  Essential hypertension: Blood pressure is controlled.  5.  Hyperlipidemia: Continue treatment with rosuvastatin.  Most recent lipid profile showed an LDL of 72.  6.  Tobacco use: She cut down to 2 cigarettes a day.  I explained to her the importance of complete cessation.    Disposition:   FU with me in 1 month  Signed,  [name], MD  06/27/2020 1:41 PM    [facility]

## 2020-06-28 LAB — BASIC METABOLIC PANEL
BUN/Creatinine Ratio: 16 (ref 12–28)
BUN: 18 mg/dL (ref 8–27)
CO2: 22 mmol/L (ref 20–29)
Calcium: 9.4 mg/dL (ref 8.7–10.3)
Chloride: 100 mmol/L (ref 96–106)
Creatinine, Ser: 1.13 mg/dL — ABNORMAL HIGH (ref 0.57–1.00)
Glucose: 90 mg/dL (ref 65–99)
Potassium: 4.8 mmol/L (ref 3.5–5.2)
Sodium: 138 mmol/L (ref 134–144)
eGFR: 52 mL/min/{1.73_m2} — ABNORMAL LOW (ref >59)

## 2020-06-28 LAB — CBC
Hematocrit: 40.9 % (ref 34.0–46.6)
Hemoglobin: 13.8 g/dL (ref 11.1–15.9)
MCH: 30.4 pg (ref 26.6–33.0)
MCHC: 33.7 g/dL (ref 31.5–35.7)
MCV: 90 fL (ref 79–97)
Platelets: 206 10*3/uL (ref 150–450)
RBC: 4.54 x10E6/uL (ref 3.77–5.28)
RDW: 12.6 % (ref 11.7–15.4)
WBC: 11 10*3/uL — ABNORMAL HIGH (ref 3.4–10.8)

## 2020-07-09 ENCOUNTER — Other Ambulatory Visit: Payer: Self-pay | Admitting: Cardiology

## 2020-07-10 ENCOUNTER — Other Ambulatory Visit (HOSPITAL_COMMUNITY)
Admission: RE | Admit: 2020-07-10 | Discharge: 2020-07-10 | Disposition: A | Discharge status: Home / Self Care | Payer: Medicare HMO | Source: Ambulatory Visit | Attending: Cardiovascular Disease | Admitting: Cardiovascular Disease

## 2020-07-10 DIAGNOSIS — Z20822 Contact with and (suspected) exposure to covid-19: Secondary | ICD-10-CM | POA: Diagnosis not present

## 2020-07-10 DIAGNOSIS — Z01812 Encounter for preprocedural laboratory examination: Secondary | ICD-10-CM | POA: Insufficient documentation

## 2020-07-10 LAB — SARS CORONAVIRUS 2 (TAT 6-24 HRS): SARS Coronavirus 2: NEGATIVE

## 2020-07-10 NOTE — Telephone Encounter (Signed)
Rx has been sent to the pharmacy electronically. ° °

## 2020-07-11 NOTE — Pre-Procedure Instructions (Signed)
Attempted to call patient regarding procedure instructions.  Left voice mail on the following items.  Nothing to eat or drink after midnight. Ok to take am medications with the exception of HCTZ and Lasix.  You need responsible adult to drive you home tomorrow as well as stay overnight with you.  Arrival time is 6:30

## 2020-07-12 ENCOUNTER — Encounter (HOSPITAL_COMMUNITY)
Admission: RE | Disposition: A | Discharge status: Home / Self Care | Payer: Self-pay | Source: Home / Self Care | Attending: Cardiovascular Disease

## 2020-07-12 ENCOUNTER — Other Ambulatory Visit: Payer: Self-pay

## 2020-07-12 ENCOUNTER — Other Ambulatory Visit: Payer: Self-pay | Admitting: *Deleted

## 2020-07-12 ENCOUNTER — Ambulatory Visit (HOSPITAL_COMMUNITY)
Admission: RE | Admit: 2020-07-12 | Discharge: 2020-07-12 | Disposition: A | Discharge status: Home / Self Care | Payer: Medicare HMO | Attending: Cardiovascular Disease | Admitting: Cardiovascular Disease

## 2020-07-12 ENCOUNTER — Encounter (HOSPITAL_COMMUNITY): Payer: Self-pay | Admitting: Cardiovascular Disease

## 2020-07-12 DIAGNOSIS — I2510 Atherosclerotic heart disease of native coronary artery without angina pectoris: Secondary | ICD-10-CM | POA: Diagnosis not present

## 2020-07-12 DIAGNOSIS — I70223 Atherosclerosis of native arteries of extremities with rest pain, bilateral legs: Secondary | ICD-10-CM | POA: Insufficient documentation

## 2020-07-12 DIAGNOSIS — I110 Hypertensive heart disease with heart failure: Secondary | ICD-10-CM | POA: Insufficient documentation

## 2020-07-12 DIAGNOSIS — I739 Peripheral vascular disease, unspecified: Secondary | ICD-10-CM

## 2020-07-12 DIAGNOSIS — I700 Atherosclerosis of aorta: Secondary | ICD-10-CM | POA: Insufficient documentation

## 2020-07-12 DIAGNOSIS — Z7982 Long term (current) use of aspirin: Secondary | ICD-10-CM | POA: Insufficient documentation

## 2020-07-12 DIAGNOSIS — F17210 Nicotine dependence, cigarettes, uncomplicated: Secondary | ICD-10-CM | POA: Diagnosis not present

## 2020-07-12 DIAGNOSIS — Z79899 Other long term (current) drug therapy: Secondary | ICD-10-CM | POA: Insufficient documentation

## 2020-07-12 DIAGNOSIS — I70213 Atherosclerosis of native arteries of extremities with intermittent claudication, bilateral legs: Secondary | ICD-10-CM | POA: Diagnosis not present

## 2020-07-12 DIAGNOSIS — E785 Hyperlipidemia, unspecified: Secondary | ICD-10-CM | POA: Insufficient documentation

## 2020-07-12 DIAGNOSIS — I708 Atherosclerosis of other arteries: Secondary | ICD-10-CM | POA: Insufficient documentation

## 2020-07-12 DIAGNOSIS — I5032 Chronic diastolic (congestive) heart failure: Secondary | ICD-10-CM | POA: Insufficient documentation

## 2020-07-12 DIAGNOSIS — I701 Atherosclerosis of renal artery: Secondary | ICD-10-CM

## 2020-07-12 DIAGNOSIS — Z885 Allergy status to narcotic agent status: Secondary | ICD-10-CM | POA: Diagnosis not present

## 2020-07-12 HISTORY — PX: PERIPHERAL VASCULAR INTERVENTION: CATH118257

## 2020-07-12 HISTORY — PX: ABDOMINAL AORTOGRAM W/LOWER EXTREMITY: CATH118223

## 2020-07-12 LAB — POCT ACTIVATED CLOTTING TIME: Activated Clotting Time: 279 seconds

## 2020-07-12 SURGERY — ABDOMINAL AORTOGRAM W/LOWER EXTREMITY
Anesthesia: LOCAL

## 2020-07-12 MED ORDER — SODIUM CHLORIDE 0.9% FLUSH: 3.0000 mL | Freq: Two times a day (BID) | INTRAVENOUS | Status: DC

## 2020-07-12 MED ORDER — HEPARIN (PORCINE) IN NACL 1000-0.9 UT/500ML-% IV SOLN
INTRAVENOUS | Status: DC | PRN
  Administered 2020-07-12 (×2): 500 mL

## 2020-07-12 MED ORDER — IODIXANOL 320 MG/ML IV SOLN
INTRAVENOUS | Status: DC | PRN
  Administered 2020-07-12: 170 mL via INTRA_ARTERIAL

## 2020-07-12 MED ORDER — HEPARIN (PORCINE) IN NACL 1000-0.9 UT/500ML-% IV SOLN
INTRAVENOUS | Status: AC
  Filled 2020-07-12: qty 1000

## 2020-07-12 MED ORDER — LIDOCAINE HCL (PF) 1 % IJ SOLN
INTRAMUSCULAR | Status: AC
  Filled 2020-07-12: qty 30

## 2020-07-12 MED ORDER — ONDANSETRON HCL 4 MG/2ML IJ SOLN: 4.0000 mg | Freq: Four times a day (QID) | INTRAMUSCULAR | Status: DC | PRN

## 2020-07-12 MED ORDER — HEPARIN SODIUM (PORCINE) 1000 UNIT/ML IJ SOLN
INTRAMUSCULAR | Status: DC | PRN
  Administered 2020-07-12: 5000 [IU] via INTRAVENOUS

## 2020-07-12 MED ORDER — CLOPIDOGREL BISULFATE 75 MG PO TABS: 75.0000 mg | ORAL_TABLET | Freq: Every day | ORAL | 6 refills | Status: DC

## 2020-07-12 MED ORDER — LIDOCAINE HCL (PF) 1 % IJ SOLN
INTRAMUSCULAR | Status: DC | PRN
  Administered 2020-07-12: 10 mL
  Administered 2020-07-12: 20 mL

## 2020-07-12 MED ORDER — SODIUM CHLORIDE 0.9% FLUSH: 3.0000 mL | INTRAVENOUS | Status: DC | PRN

## 2020-07-12 MED ORDER — HYDRALAZINE HCL 20 MG/ML IJ SOLN: 5.0000 mg | INTRAMUSCULAR | Status: DC | PRN

## 2020-07-12 MED ORDER — MIDAZOLAM HCL 2 MG/2ML IJ SOLN
INTRAMUSCULAR | Status: DC | PRN
  Administered 2020-07-12 (×2): 1 mg via INTRAVENOUS

## 2020-07-12 MED ORDER — MIDAZOLAM HCL 2 MG/2ML IJ SOLN
INTRAMUSCULAR | Status: AC
  Filled 2020-07-12: qty 2

## 2020-07-12 MED ORDER — SODIUM CHLORIDE 0.9 % IV SOLN: 250.0000 mL | INTRAVENOUS | Status: DC | PRN

## 2020-07-12 MED ORDER — LABETALOL HCL 5 MG/ML IV SOLN
10.0000 mg | INTRAVENOUS | Status: DC | PRN
Start: 2020-07-12 — End: 2020-07-12

## 2020-07-12 MED ORDER — FENTANYL CITRATE (PF) 100 MCG/2ML IJ SOLN
INTRAMUSCULAR | Status: AC
  Filled 2020-07-12: qty 2

## 2020-07-12 MED ORDER — CLOPIDOGREL BISULFATE 300 MG PO TABS
ORAL_TABLET | ORAL | Status: AC
  Filled 2020-07-12: qty 1

## 2020-07-12 MED ORDER — ASPIRIN 81 MG PO CHEW
81.0000 mg | CHEWABLE_TABLET | ORAL | Status: DC
Start: 2020-07-13 — End: 2020-07-12

## 2020-07-12 MED ORDER — FENTANYL CITRATE (PF) 100 MCG/2ML IJ SOLN
INTRAMUSCULAR | Status: DC | PRN
  Administered 2020-07-12 (×2): 50 ug via INTRAVENOUS

## 2020-07-12 MED ORDER — SODIUM CHLORIDE 0.9 % IV SOLN: INTRAVENOUS | Status: DC

## 2020-07-12 MED ORDER — CLOPIDOGREL BISULFATE 300 MG PO TABS
ORAL_TABLET | ORAL | Status: DC | PRN
  Administered 2020-07-12: 300 mg via ORAL

## 2020-07-12 MED ORDER — ACETAMINOPHEN 325 MG PO TABS: 650.0000 mg | ORAL_TABLET | ORAL | Status: DC | PRN

## 2020-07-12 MED ORDER — HEPARIN SODIUM (PORCINE) 1000 UNIT/ML IJ SOLN
INTRAMUSCULAR | Status: AC
  Filled 2020-07-12: qty 1

## 2020-07-12 SURGICAL SUPPLY — 21 items
BALLN MUSTANG 6.0X40 75 (BALLOONS) ×3 IMPLANT
BALLN MUSTANG 7.0X40 75 (BALLOONS) ×3 IMPLANT
BALLOON MUSTANG 6.0X40 75 (BALLOONS) ×2 IMPLANT
BALLOON MUSTANG 7.0X40 75 (BALLOONS) ×2 IMPLANT
CATH ANGIO 5F PIGTAIL 65CM (CATHETERS) ×3 IMPLANT
CLOSURE PERCLOSE PROSTYLE (VASCULAR PRODUCTS) ×3 IMPLANT
GUIDEWIRE ANGLED .035X150CM (WIRE) ×3 IMPLANT
KIT ENCORE 26 ADVANTAGE (KITS) ×3 IMPLANT
KIT MICROPUNCTURE NIT STIFF (SHEATH) ×3 IMPLANT
KIT PV (KITS) ×3 IMPLANT
SHEATH BRITE TIP 7FR 35CM (SHEATH) ×3 IMPLANT
SHEATH PINNACLE 5F 10CM (SHEATH) ×3 IMPLANT
SHEATH PROBE COVER 6X72 (BAG) ×3 IMPLANT
STENT INNOVA 8X60X130 (Permanent Stent) ×3 IMPLANT
STOPCOCK MORSE 400PSI 3WAY (MISCELLANEOUS) ×3 IMPLANT
SYR MEDRAD MARK 7 150ML (SYRINGE) ×3 IMPLANT
TRANSDUCER W/STOPCOCK (MISCELLANEOUS) ×3 IMPLANT
TRAY PV CATH (CUSTOM PROCEDURE TRAY) ×3 IMPLANT
TUBING CIL FLEX 10 FLL-RA (TUBING) ×3 IMPLANT
WIRE BENTSON .035X145CM (WIRE) ×3 IMPLANT
WIRE HI TORQ VERSACORE J 260CM (WIRE) ×3 IMPLANT

## 2020-07-12 NOTE — Progress Notes (Signed)
Ambulated in hallway tol well no bleeding noted before or after ambulation  

## 2020-07-12 NOTE — Discharge Instructions (Signed)
Start clopidogrel (Plavix) 75 mg once daily starting tomorrow.  A prescription was sent to your pharmacy.   Femoral Site Care  This sheet gives you information about how to care for yourself after your procedure. Your health care provider may also give you more specific instructions. If you have problems or questions, contact your health care provider. What can I expect after the procedure? After the procedure, it is common to have:  Bruising that usually fades within 1-2 weeks.  Tenderness at the site. Follow these instructions at home: Wound care  Follow instructions from your health care provider about how to take care of your insertion site. Make sure you: ? Wash your hands with soap and water before you change your bandage (dressing). If soap and water are not available, use hand sanitizer. ? Change your dressing as told by your health care provider. ? Leave stitches (sutures), skin glue, or adhesive strips in place. These skin closures may need to stay in place for 2 weeks or longer. If adhesive strip edges start to loosen and curl up, you may trim the loose edges. Do not remove adhesive strips completely unless your health care provider tells you to do that.  Do not take baths, swim, or use a hot tub until your health care provider approves.  You may shower 24-48 hours after the procedure or as told by your health care provider. ? Gently wash the site with plain soap and water. ? Pat the area dry with a clean towel. ? Do not rub the site. This may cause bleeding.  Do not apply powder or lotion to the site. Keep the site clean and dry.  Check your femoral site every day for signs of infection. Check for: ? Redness, swelling, or pain. ? Fluid or blood. ? Warmth. ? Pus or a bad smell. Activity  For the first 2-3 days after your procedure, or as long as directed: ? Avoid climbing stairs as much as possible. ? Do not squat.  Do not lift anything that is heavier than 10 lb  (4.5 kg), or the limit that you are told, until your health care provider says that it is safe.  Rest as directed. ? Avoid sitting for a long time without moving. Get up to take short walks every 1-2 hours.  Do not drive for 24 hours if you were given a medicine to help you relax (sedative). General instructions  Take over-the-counter and prescription medicines only as told by your health care provider.  Keep all follow-up visits as told by your health care provider. This is important. Contact a health care provider if you have:  A fever or chills.  You have redness, swelling, or pain around your insertion site. Get help right away if:  The catheter insertion area swells very fast.  You pass out.  You suddenly start to sweat or your skin gets clammy.  The catheter insertion area is bleeding, and the bleeding does not stop when you hold steady pressure on the area.  The area near or just beyond the catheter insertion site becomes pale, cool, tingly, or numb. These symptoms may represent a serious problem that is an emergency. Do not wait to see if the symptoms will go away. Get medical help right away. Call your local emergency services (911 in the [place] Do not drive yourself to the hospital. Summary  After the procedure, it is common to have bruising that usually fades within 1-2 weeks.  Check your femoral site every  day for signs of infection.  Do not lift anything that is heavier than 10 lb (4.5 kg), or the limit that you are told, until your health care provider says that it is safe. This information is not intended to replace advice given to you by your health care provider. Make sure you discuss any questions you have with your health care provider. Document Revised: 11/12/2019 Document Reviewed: 11/12/2019 Elsevier Patient Education  [place].

## 2020-07-12 NOTE — Interval H&P Note (Signed)
History and Physical Interval Note:  07/12/2020 8:44 AM  Alexandra [name]  has presented today for surgery, with the diagnosis of pad.  The various methods of treatment have been discussed with the patient and family. After consideration of risks, benefits and other options for treatment, the patient has consented to  Procedure(s): ABDOMINAL AORTOGRAM W/LOWER EXTREMITY (N/A) as a surgical intervention.  The patient's history has been reviewed, patient examined, no change in status, stable for surgery.  I have reviewed the patient's chart and labs.  Questions were answered to the patient's satisfaction.     [name]

## 2020-07-20 ENCOUNTER — Ambulatory Visit (HOSPITAL_COMMUNITY)
Admission: RE | Admit: 2020-07-20 | Discharge: 2020-07-20 | Disposition: A | Discharge status: Home / Self Care | Payer: Medicare HMO | Source: Ambulatory Visit | Attending: Cardiology | Admitting: Cardiology

## 2020-07-20 ENCOUNTER — Ambulatory Visit (HOSPITAL_BASED_OUTPATIENT_CLINIC_OR_DEPARTMENT_OTHER)
Admission: RE | Admit: 2020-07-20 | Discharge: 2020-07-20 | Disposition: A | Discharge status: Home / Self Care | Payer: Medicare HMO | Source: Ambulatory Visit | Attending: Cardiology | Admitting: Cardiology

## 2020-07-20 ENCOUNTER — Other Ambulatory Visit (HOSPITAL_COMMUNITY): Payer: Self-pay | Admitting: Cardiovascular Disease

## 2020-07-20 ENCOUNTER — Other Ambulatory Visit: Payer: Self-pay

## 2020-07-20 DIAGNOSIS — I739 Peripheral vascular disease, unspecified: Secondary | ICD-10-CM | POA: Diagnosis not present

## 2020-07-20 DIAGNOSIS — I701 Atherosclerosis of renal artery: Secondary | ICD-10-CM | POA: Insufficient documentation

## 2020-07-20 DIAGNOSIS — Z95828 Presence of other vascular implants and grafts: Secondary | ICD-10-CM

## 2020-08-02 ENCOUNTER — Ambulatory Visit (HOSPITAL_COMMUNITY): Payer: Medicare HMO | Attending: Cardiology

## 2020-08-02 ENCOUNTER — Other Ambulatory Visit: Payer: Self-pay

## 2020-08-02 DIAGNOSIS — I5032 Chronic diastolic (congestive) heart failure: Secondary | ICD-10-CM | POA: Diagnosis present

## 2020-08-02 DIAGNOSIS — Z952 Presence of prosthetic heart valve: Secondary | ICD-10-CM

## 2020-08-02 DIAGNOSIS — I272 Pulmonary hypertension, unspecified: Secondary | ICD-10-CM | POA: Diagnosis present

## 2020-08-02 DIAGNOSIS — R918 Other nonspecific abnormal finding of lung field: Secondary | ICD-10-CM

## 2020-08-02 HISTORY — PX: TRANSTHORACIC ECHOCARDIOGRAM: SHX275

## 2020-08-02 LAB — ECHOCARDIOGRAM COMPLETE
AR max vel: 0.89 cm2
AV Area VTI: 1.06 cm2
AV Area mean vel: 1.1 cm2
AV Mean grad: 16 mmHg
AV Peak grad: 33.4 mmHg
Ao pk vel: 2.89 m/s
Area-P 1/2: 1.63 cm2
MV M vel: 5.04 m/s
MV Peak grad: 101.6 mmHg
Radius: 0.7 cm
S' Lateral: 3.5 cm

## 2020-08-07 ENCOUNTER — Ambulatory Visit: Payer: Medicare HMO | Admitting: Cardiology

## 2020-08-07 ENCOUNTER — Other Ambulatory Visit: Payer: Self-pay

## 2020-08-07 ENCOUNTER — Encounter: Payer: Self-pay | Admitting: Cardiology

## 2020-08-07 VITALS — BP 168/72 | HR 64 | Ht 66.0 in | Wt 164.0 lb

## 2020-08-07 DIAGNOSIS — I739 Peripheral vascular disease, unspecified: Secondary | ICD-10-CM

## 2020-08-07 DIAGNOSIS — E785 Hyperlipidemia, unspecified: Secondary | ICD-10-CM | POA: Diagnosis not present

## 2020-08-07 DIAGNOSIS — I2510 Atherosclerotic heart disease of native coronary artery without angina pectoris: Secondary | ICD-10-CM

## 2020-08-07 DIAGNOSIS — Z952 Presence of prosthetic heart valve: Secondary | ICD-10-CM

## 2020-08-07 DIAGNOSIS — E782 Mixed hyperlipidemia: Secondary | ICD-10-CM

## 2020-08-07 DIAGNOSIS — I272 Pulmonary hypertension, unspecified: Secondary | ICD-10-CM

## 2020-08-07 DIAGNOSIS — F17200 Nicotine dependence, unspecified, uncomplicated: Secondary | ICD-10-CM

## 2020-08-07 DIAGNOSIS — I5032 Chronic diastolic (congestive) heart failure: Secondary | ICD-10-CM

## 2020-08-07 DIAGNOSIS — I350 Nonrheumatic aortic (valve) stenosis: Secondary | ICD-10-CM

## 2020-08-07 DIAGNOSIS — I10 Essential (primary) hypertension: Secondary | ICD-10-CM | POA: Diagnosis not present

## 2020-08-07 NOTE — Progress Notes (Signed)
Primary Care Provider: [name] Cardiologist: Alexandra Hew, MD PV Cardiologist: Alexandra Sacramento, MD Electrophysiologist: None  Clinic Note: Chief Complaint  Patient presents with   Follow-up    Cough seems to be improved. Right leg claudication definitely improved. Notes nocturia   PAD    Multiple studies reviewed.  Has been referred to Dr. Fletcher [name] -> due to see tomorrow.   Hypertension    Has had elevated blood pressures despite multiple medications.  Now documented renal artery stenosis with potential plans for right renal artery stenting.    ===================================  ASSESSMENT/PLAN   Problem List Items Addressed This Visit     Pulmonary hypertension: Combined secondary as well as lung disease (Chronic)    Combination of underlying lung disease and valvular heart disease with persistent MR.  Pulmonary pressures are only mildly elevated on recent echo.  Continue blood pressure management notably with calcium channel blocker and diuretics.  She is on low-dose HCTZ with Ziac and then 2 to 3 days a week taking furosemide.       Resistant hypertension (Chronic)    Last visit, her blood pressure was better, but the most recent evaluations have shown elevated pressures on multiple medications including max dose losartan, amlodipine and pretty high dose of bisoprolol-HCTZ (Ziac).  Plan per Dr. Fletcher [name] is to consider right renal artery stenting.  Left kidney seems to be atretic and not salvageable.  I will hold off any medication adjustments at this time until renal artery disease is treated.       Relevant Orders   Comprehensive metabolic panel (Completed)   CBC (Completed)   Chronic diastolic (congestive) heart failure (HCC) (Chronic)    Overall seems to doing better.  Echo shows preserved EF with only grade 1 diastolic function.  I think diastolic function is also worsened by MR.  She has difficult to manage resistant  hypertension on multiple medications, and now newly documented renal artery stenosis.  Thankfully, her renal function is stable.  Plan: Continue aggressive blood pressure management with Ziac, amlodipine, and losartan at current doses.  She is also on to 3 days a week standing furosemide with PRN dosing. ->  Hopefully as her renal artery stenosis is treated, we will the other get some more blood pressure control.       Hyperlipidemia with target LDL less than 70 (Chronic)    Current LDL 75 with recent previous LDL was 72.  She is only on 10 mg rosuvastatin.  Plan for now will be to try to increase to 20 mg daily and reassess in 3 to 4 months.  If she not able to tolerate higher dose of statin, would probably add ezetimibe, but low threshold to consider PCSK9 inhibitor.       Relevant Orders   Lipid panel (Completed)   Comprehensive metabolic panel (Completed)   CBC (Completed)   Smoker (Chronic)    Into her with the extensiveShe is down to 1 or 2 cigarettes a day, but is not able to make the final step.  I tried explained to her that with extensive PAD and CAD that she has, it will only get worse if she continues to smoke.  I think she is using this as a emotional crutch.       Coronary artery disease, non-occlusive (Chronic)    Distal LAD 65% stenosis seen on cath but no other significant disease noted although there is extensive calcification seen on CT scan.  Is also  associated with significant PAD, abdominal aortic disease, renal artery stenosis.  She is on beta-blocker and ARB along with amlodipine with no active anginal symptoms.  She remains on aspirin and Plavix.  Plavix reportedly only for 1 month, but with renal artery disease I would continue Plavix long-term.  We can therefore reduce aspirin to 1 tablet daily.  Aggressive risk factor modification -> we just check lipids that are reviewed at the time of this dictation with LDL of 75. ->  Now that we have her claudication  under control, I would like to increase her rosuvastatin to 20 mg daily.  Recheck lipids in 3 to 4 months.       Relevant Orders   Lipid panel (Completed)   Comprehensive metabolic panel (Completed)   CBC (Completed)   S/P TAVR (transcatheter aortic valve replacement) - Primary (Chronic)     --prosthetic valve seems doing very well.  Only mild paravalvular leak with mean gradient of 16 mmHg.  Normal EF 60 to 65%.       Relevant Orders   Comprehensive metabolic panel (Completed)   CBC (Completed)   Severe aortic stenosis (Chronic)    S/p TAVR in Oct 2019       PAD (peripheral artery disease) (HCC) (Chronic)    Multiple studies reviewed above.  Being followed by Dr. Fletcher [name].  Status post PTA-stent of right iliac artery with improved claudication.  Known bilateral SFA and PTA occlusions being treated medically for now.  Will defer to Dr. Fletcher [name] - but anticipate potential use of cilostazol.  RF modification as noted for CAD.  On aspirin and Plavix. -Would probably continue Plavix longer than just 1 month based on the severity of her disease.  Can reduce aspirin to 1 tablet daily.       Relevant Orders   Lipid panel (Completed)   Comprehensive metabolic panel (Completed)   CBC (Completed)   ===================================  HPI:    Alexandra [name] is a 70 y.o. female with a PMH notable for CAD, Severe AS-s/p TAVR, Chronic HFpEF, Resistant (labile) HTN, Pulomnary HTN & COPD (long-term smoker) who presents today for 3 month f/u.  12/30/2017: TAVR (23 mm Edwards SAPIEN 3 THP via TF approach  [name] was last seen on May 05, 2020.  She noted that she put on about 9pounds since her last visit and was little more short of breath than usual with some edema.  She was using compression stockings which was helping some but was starting develop a nagging cough with a gurgling sound in her neck and throat.  No real orthopnea or PND.  No chest pain or pressure.  No  palpitations. -> She had screening evaluation done by someone from her insurance company and indicated that she had high risk of narrowings in her legs.  She had a form that said severe lower extremity arterial disease. --> noted low Energy, wgt gain, Cough, SOB. R>L Claudication, Early AM GERD. -- likely dysthymic. Follow-up echo ordered along with chest x-ray because of cough.  Standing dose of Lasix added.[name] ABIs/LEA Dopplers ordered and referred to Dr. Sophronia Simas.  She was seen by Dr. Fletcher [name] on April 5 for PV evaluation-having noted bilateral calf claudication over the last few years worse in the right than left.  Only able to walk to the mailbox having to stop halfway back.  She also started noting having some resting pain in the right leg at night. ->  ABIs/LEA Dopplers from February reviewed (see below) =>  scheduled for Abdominal Aortogram w/ Bilateral LE Runoff - possible endovascular intervention of R Com Iliac A.  Plan to treat SFA CTOs medically - cilostazol.  Started Plavix -> noted that she has cut down to 2 cigarettes / day.    Recent Hospitalizations: Abdominal aortic arteriogram with runoff on April 20  Reviewed  CV studies:    The following studies were reviewed today: (if available, images/films reviewed: From Epic Chart or Care Everywhere) 2V CXR ordered - she did not follow-up with study. 05/17/2020: ABI/LEA Dopplers - R ABI 0.49, L ABI 0.57.  Duplex showed severe stenosis of the distal R Com Iliac A w/ occluded SFA and occluded R PTA, and moderate L Iliac A  & occluded LSFA & L PTA. 07/12/2020: Abdominal Aortogram w/ LE Runoff & PV Intervention: Severe Bilateral RA Stenosis - L Renal A small w/ atrophied L Kidney (99% ostial L RA, 80-90% R RA).  Small Aneurysmal areas in the abdominal aorta below the RA's with extensive atheroma but no obstructive disease. RLE: Severe stenosis in the Common Iliac A into the proximal portion of the external Iliac A, occluded Internal Iliac A  (Hypogastric A); Moderate CFA with flush CTO of RSFA-Pop A --> reconstitutes in distal Pop A - 2 V runoff (CTO of PTA) PTA with Stent Distal R Com Iliac A into prox Ext Iliac A. - 8 x 60 mm Innova Self-Expanding Stent.  LLE: Mild Iliac disease, Flush CTO of LSFA - reconstitues in distal Pop A, ~3 V runoff until distal R PTA CTO.  Renal A Dopplers & ABIs w/ LEA Dopplers - 07/20/2020:  Largest aortic diameter 2.8 cm-ectatic mid abdominal aorta. RIGHT: Normal-sized kidney.  Abnormal Resistive Index w/ evidence of > 60% stenosis in R RA.  RRV flow present. Exophytic Cyst noted in Upper pole (1.6x1.7x1.7 cm) LEFT: Abnormal size Left Kidney with Abnormal Left Resistive Index.  Abnormal cortical thickness. Evidence of > 60% L RA stenosis. L RV flow present. Exophytic cysts: lower pole 1.9 x 1.7 x 1.9 cm, and 1.4 x 1.2 x 1.5 cm. Normal celiac artery with 70 to 99% stenosis in SMA. Patent IVC ~>50% R prox Com Iliac A stenosis with patent distal Com-Ext Iliac A Stent with mildly elevated velocities. ~>50% distal R Ext Iliac A stenosis. Widely patent L Com Iliac A with ~ 50% L Ext Iliac A stenosis.  RABI 0.6 & LABI 0.59  TTE 08/02/2020: EF 60 to 65%.  GR 1 DD.  Elevated LAP/LVEDP.  Mildly elevated PAP.  Mild LA dilation.  23 mm SAPIEN 3 prosthetic TAVR valve present.  Mild perivalvular leak.  Mean gradient 16 mmHg. Mod MR.   Interval History:   Alexandra [name] returns here today overall doing better.  Her cough is improved.  She does have frequent nocturia which bothers her some.  She is only taking Lasix maybe 2-3 times a week and swelling seems to be controlled.  She has not really lost any weight since her last visit, but her breathing seems to have improved.  Her right leg claudication has definitely improved, but is still there.  No resting symptoms.  She had questions about her kidney artery Dopplers which will be reviewed with Dr. Fletcher [name] tomorrow.  Her pressures been elevated, and I think this could be  related to renal artery stenosis.  Interestingly, although the SFA occlusions and iliac artery diseases was noted in her CT angiogram pre-TAVR, there was no comment on kidney size or renal artery stenosis.  She  seems to doing pretty well with baseline exertional dyspnea, but overall the gurgling sensation in her chest seems to be better now.  Energy level has improved some, but not fully.  She denies any rapid regular heartbeats or palpitations.  No PND, orthopnea, well-controlled edema.   CV Review of Symptoms (Summary) Cardiovascular ROS: positive for - dyspnea on exertion, edema, and claudication although notably improved, especially on the right.  No resting claudication negative for - chest pain, irregular heartbeat, orthopnea, palpitations, paroxysmal nocturnal dyspnea, rapid heart rate, shortness of breath, or lightheadedness, dizziness, wooziness or syncope/near syncope, TIA/amaurosis fugax.   REVIEWED OF SYSTEMS   Review of Systems  Constitutional:  Positive for malaise/fatigue (Energy level is better, but not great). Negative for weight loss.  HENT:  Negative for congestion (This is improved.) and nosebleeds.   Respiratory:  Positive for cough (Still has intermittent cough, but better) and shortness of breath (Baseline). Negative for sputum production.   Cardiovascular:  Positive for claudication (Improved, no resting claudication) and leg swelling (Controlled).  Gastrointestinal:  Negative for abdominal pain, blood in stool, constipation and melena.  Genitourinary:  Positive for frequency (Mostly nocturia). Negative for hematuria.  Musculoskeletal:  Positive for joint pain. Negative for falls and myalgias.  Neurological:  Positive for headaches. Negative for dizziness, focal weakness and weakness.  Psychiatric/Behavioral:  Negative for depression (She does not claim to be depressed, although she seems to be somewhat down in mood, is not as active as she had been, and not eating as  well.) and memory loss. The patient is not nervous/anxious and does not have insomnia.    I have reviewed and (if needed) personally updated the patient's problem list, medications, allergies, past [name] and surgical history, social and family history.   PAST [name] HISTORY   Past [name] History:  Diagnosis Date   Arthritis    COPD (chronic obstructive pulmonary disease) ([place]) 05/22/2016   Severely decreased DLCO by PFTs in April 2019 but only mild obstructive disease.    Coronary artery disease, non-occlusive 11/28/2016   R&LHC: 65% distal LAD lesion -- likely not angiographically significant->[name] management. Normal LVEF. Moderately elevated LVEDP. I aortic valve gradient in the Cath Lab, moderate aortic stenosis. This would suggest that the stenosis is on the moderate side of moderate to severe.  Severe pulmonary hypertension (likely mixed).  AVA 0.96 cm; P-P gradient ~ 30 mmHg, Mean ~24.5-27.5 (Moderate AS)    GERD (gastroesophageal reflux disease)    Heart murmur    Hx of adenomatous colonic polyps 10/19/2015   Hyperlipidemia    Hypertension    Moderate mitral regurgitation by prior echocardiography 10/2016   Echocardiogram revealed moderate mitral regurgitation; most recent Echo 02/12/2019- MAC with moderate MR   Multiple renal cysts 06/2020   Renal A Dopplers: Exophytic Cyst noted in Upper pole of Right Kidney (1.6x1.7x1.7 cm); Exophytic cysts: lower pole of Left Kidney 1.9 x 1.7 x 1.9 cm, and 1.4 x 1.2 x 1.5 cm.   PAOD (peripheral arterial occlusive disease) ([place]) 06/2020   Abd AoGram-BLE Runoff: Severe Bilat RA stenosis - small. atretic L RA. Severe stenosis of R Com Iliac A - Ext Iliac A (PTA-Stent). Bilateral SFA flush CTO. Mod R CFA, L Iliac & CFA. Bilateral SFA reconstitution distal Pop A. R PTA flush CTO 2 V runoff. L LE 3 V runoff unitl distal LPTA CTO.   Small Infrarenal aneurysmal areas with extensive atheroma - no obstructive disease   Pulmonary hypertension  associated Aortic Stenosis, Mitral Regurgitation &  COPD    Peak PAP by Echo ~54 mmHg;  by Cath PAP/Mean 71/32 mmHg, 53 mmHg (with PCWP & LVEDP ~24-26 mmHg); TPG ~26 mmHg   Pulmonary nodules    Renal artery stenosis, native, bilateral (HCC) 06/2020   Abd Ao Gram: LRA ~ 99% ostial stenosis with small atretic L Kidney (f/u dopplers - Abnormal L kidney size & Resistive Index); R RA ostial 80-90% stenosis (confirmed by doppler Resistive Index, but normal R kidney size.   S/P TAVR (transcatheter aortic valve replacement) 12/30/2017   Edward's Sapien 3 bioprosthetic THB via TF approach   Severe aortic stenosis 2018   Underwent TAVR in 2019   Superior mesenteric artery stenosis ([place]) 06/2020   Abdominal Ao Dopplers - 70-90% Ostial SMA stenosis with normal Celiac A.    PAST SURGICAL HISTORY   Past Surgical History:  Procedure Laterality Date   ABDOMINAL AORTOGRAM W/LOWER EXTREMITY N/A 07/12/2020   Procedure: ABDOMINAL AORTOGRAM W/LOWER EXTREMITY;  Surgeon: Wellington Hampshire, MD;  Location: [facility];; Severe B-RA stenosis - sm/atretic L RA. Severe Dz R Com-Ext Iliac A (PTA-Stent). B-SFA flush CTO. Mod R CFA, L Iliac-CFA. B-SFA recon @ dPop A. R PTA flush CTO & 2 V runoff. L LE 3 V runoff w/ distal LPTA CTO.   Sm InfraRenal aneurysms w/ extensive atheroma - no obstructive Dz   APPENDECTOMY     BREAST CYST EXCISION Right 1970   CARDIAC VALVE REPLACEMENT     CATARACT EXTRACTION W/ INTRAOCULAR LENS IMPLANT Right    CHOLECYSTECTOMY OPEN     COLONOSCOPY  2003, 2017   CTA Chest  09/2016   Thoracic Aortic Ca2+ w/o dilation.  Coronary Calcification noted. PA normal. Mild Emphysematous changes w/ mild LLL scarring.     FRACTURE SURGERY     2007 -right tib /fib fracture ----07/2009 right femur fx after a fall   FRACTURE SURGERY Right 2011   Femur   PERIPHERAL VASCULAR CATHETERIZATION N/A 08/09/2014   Procedure: Aortic Arch Angiography;  Surgeon: Serafina Mitchell, MD;  Location: [facility];   Service: Cardiovascular: Type 1 Arch. No significant stenosis, aneurysmal degeneration or dissection.  No luminal irregularity seen in R or L SubClavian, Brachial or Radial A. Chronic distal ulnar A occlusion Bil[place]a.   PERIPHERAL VASCULAR CATHETERIZATION Right 08/09/2014   Procedure: Upper Extremity Angiography;  Surgeon: Serafina Mitchell, MD;  Location: [facility];  Service: Cardiovascular;  Laterality: Right;   PERIPHERAL VASCULAR INTERVENTION  07/12/2020   Procedure: PERIPHERAL VASCULAR INTERVENTION;  Surgeon: Wellington Hampshire, MD;  Location: [facility];  Service: Cardiovascular;;  R Common-External Iliac A PTA & Stent   RIGHT/LEFT HEART CATH AND CORONARY ANGIOGRAPHY N/A 12/06/2016   Procedure: RIGHT/LEFT HEART CATH AND CORONARY ANGIOGRAPHY;  Surgeon: Leonie Man, MD;  Location: [facility]: Cor Angio: 65% dLAD (Med Rx). AVA 0.96 cm; P-P gradient ~ 30 mmHg, Mean ~24.5-27.5 (Mod AS); RHC #s: RAP 8 mmHg, RVP/EDP: 71/8/15 mmHg, PCWP: 21-24 mmHg, PAP/mean: 71/32/52 mmHg = Severe Mixed Pulmonary HTN; LVP/EDP 205/17/26 mmHg ; CO/CI by Fick: 4.35, 2.44 - mildly reduced   RIGHT/LEFT HEART CATH AND CORONARY ANGIOGRAPHY N/A 11/20/2017   Procedure: RIGHT/LEFT HEART CATH AND CORONARY ANGIOGRAPHY;  Surgeon: Larey Dresser, MD;  Location: [facility];  Service: Cardiovascular;  Laterality: N/A;   TEE WITHOUT CARDIOVERSION N/A 12/30/2017   Procedure: (Intra-Op) TRANSESOPHAGEAL ECHOCARDIOGRAM (TEE);  Surgeon: Burnell Blanks, MD;  Location: [facility];  Service: Open  Heart Surgery; Post TAVR good position of 23 mm AP and 3 valve.  No new or WMA.  EF 55-60%.  No effusion.  Mild perivalvular regurgitation seen.  Mean gradient 7 mmHg.  Peak 16 mmHg.   TOTAL KNEE ARTHROPLASTY Left 12/01/2018   Procedure: Left Knee Arthroplasty;  Surgeon: Melrose Nakayama, MD;  Location: [facility];  Service: Orthopedics;  Laterality: Left;   TRANSCATHETER AORTIC VALVE REPLACEMENT, TRANSFEMORAL N/A  12/30/2017   Procedure: TRANSCATHETER AORTIC VALVE REPLACEMENT, TRANSFEMORAL;  Surgeon: Burnell Blanks, MD;  Location: [facility];  Service: Open Heart Surgery;  Laterality: N/A; 23 mm Edwards SAPIEN 3 THV   TRANSTHORACIC ECHOCARDIOGRAM  10/30/2016   Mild Concentric LVH. EF 55-60%. No RWMA, Gr 2 DD. Mod-Severe AS (mean-peak Gradient 31 mmHG - 64 mmHg), Mod MR. Mod LA dilation, Mod PA HTN - peak pressure ~54 mmHg). = Progression of AS from 2014   TRANSTHORACIC ECHOCARDIOGRAM  01/2019   EF 55 to 60%.  Elevated LVEDP.  GR 1 DD.  No R WMA.  Moderate LA dilation.  Well-seated stented bioprosthetic aortic valve.  Mean gradient 13 mercury.  Moderate MAC moderate MR.  No evidence of mildly elevated RAP/CVP and moderately elevated PA P   TRANSTHORACIC ECHOCARDIOGRAM  10/2017   a) Pre-TAVR 10/31/17: EF 55 to 60%.  GR 1 DD.  Calcific severe AS.  Peak Grad 62 mmHg, mean 32 mmHg.  Progression of Dz.  AVA ~0.8 cm.; b) Post TAVR 10/9/'19: EF 60-65%.  GR 1 DD.  Stable appearing 23 mm Edwards's Sapien AV bioprosthesis.  No perivalvular AI.  Mean Grad 22 mmHg; c) 11/13/'19: AoV mean Grad 19 mmHg,, Peak 36 mmHg. PAP ~33 mmHg   TRANSTHORACIC ECHOCARDIOGRAM  08/02/2020   EF 60 to 65%.  GR 1 DD.  Elevated LAP/LVEDP.  Mildly elevated PAP.  Mild LA dilation.  23 mm SAPIEN 3 prosthetic TAVR valve present.  Mild perivalvular leak.  Mean gradient 16 mmHg. Mod MR.   ULTRASOUND GUIDANCE FOR VASCULAR ACCESS  08/09/2014   Procedure: Ultrasound Guidance For Vascular Access;  Surgeon: Serafina Mitchell, MD;  Location: [facility];  Service: Cardiovascular;;    Immunization History  Administered Date(s) Administered   Fluad Quad(high Dose 65+) 12/02/2018   Influenza,inj,Quad PF,6+ Mos 02/20/2016   Influenza-Unspecified 11/17/2016   PFIZER Comirnaty(Gray Top)Covid-19 Tri-Sucrose Vaccine 08/23/2020   PFIZER(Purple Top)SARS-COV-2 Vaccination 07/08/2019, 07/29/2019, 03/07/2020   Pneumococcal Conjugate-13 08/15/2015    Pneumococcal Polysaccharide-23 12/24/2016   Td 08/15/2015   Tdap 07/23/2009   Zoster Recombinat (Shingrix) 07/31/2016, 11/17/2016    MEDICATIONS/ALLERGIES   Current Meds  Medication Sig   amLODipine (NORVASC) 10 MG tablet Take 10 mg by mouth daily.   aspirin 81 MG tablet Take 1 tablet (81 mg total) by mouth 2 (two) times daily at 10 AM and 5 PM.   Azelastine HCl 0.15 % SOLN Place 1 spray into both nostrils daily as needed (Congestion).   bisoprolol-hydrochlorothiazide (ZIAC) 10-6.25 MG tablet TAKE 2 TABLETS BY MOUTH DAILY   Cholecalciferol (VITAMIN D) 50 MCG (2000 UT) CAPS Take 4,000 Units by mouth daily.   clopidogrel (PLAVIX) 75 MG tablet Take 1 tablet (75 mg total) by mouth daily.   diclofenac Sodium (VOLTAREN) 1 % GEL Apply 2 g topically daily as needed (Pain).   esomeprazole (NEXIUM) 40 MG capsule Take 1 capsule (40 mg total) by mouth daily at 12 noon. (Patient taking differently: Take 40 mg by mouth daily as needed (Heartburn).)   furosemide (LASIX) 20 MG tablet  TAKE 1 TABLET BY MOUTH DAILY AS NEEDED FOR INCREASE SWELLING   guaiFENesin (MUCINEX) 600 MG 12 hr tablet Take 1 tablet (600 mg total) by mouth 2 (two) times daily. (Patient taking differently: Take 600 mg by mouth 2 (two) times daily as needed for to loosen phlegm.)   losartan (COZAAR) 100 MG tablet Take 100 mg by mouth daily.   oxyCODONE-acetaminophen (PERCOCET) 10-325 MG tablet Take 1 tablet by mouth every 4 (four) hours as needed for pain.   potassium chloride SA (KLOR-CON) 20 MEQ tablet Take 1 tablet (20 mEq total) by mouth daily. (Patient taking differently: Take 10 mEq by mouth daily.)   tiZANidine (ZANAFLEX) 4 MG tablet Take 4 mg by mouth daily as needed for muscle spasms.   [DISCONTINUED] rosuvastatin (CRESTOR) 10 MG tablet Take 10 mg by mouth daily.    Allergies  Allergen Reactions   Codeine Nausea Only   Symproic [Naldemedine] Nausea And Vomiting    SOCIAL HISTORY/FAMILY HISTORY   Reviewed in Epic:   Pertinent findings:  Social History   Tobacco Use   Smoking status: Light Smoker    Packs/day: 0.10    Years: 40.00    Pack years: 4.00    Types: Cigarettes   Smokeless tobacco: Never   Tobacco comments:    1-2 cigarettes a day  Vaping Use   Vaping Use: Never used  Substance Use Topics   Alcohol use: Not Currently    Alcohol/week: 0.0 standard drinks   Drug use: Never    Comment: +cocaine in urine 12/19/2014; pt denies this hx on 12/30/2017   Social History   Social History Narrative   Lives with her husband and her middle son.   Her two older sons are products of a previous relationship.   Her youngest son is from her current marriage.   Oldest and youngest sons live nearby.   She has total of 3 children and 6 grandchildren with 2 great-grandchildren.   She does not exercise because she has pain in her right leg.    OBJCTIVE -PE, EKG, labs   Wt Readings from Last 3 Encounters:  08/08/20 164 lb 6.4 oz (74.6 kg)  07/12/20 163 lb (73.9 kg)  06/27/20 165 lb 9.6 oz (75.1 kg)    Physical Exam: BP (!) 168/72   Pulse 64   Ht _0  (1.676 m)   Wt 164 lb (74.4 kg)   SpO2 93%   BMI 26.47 kg/m  Physical Exam Constitutional:      General: She is not in acute distress.    Appearance: Normal appearance. She is normal weight. She is ill-appearing (Mild chronically ill-appearing). She is not toxic-appearing.  HENT:     Head: Normocephalic and atraumatic.  Neck:     Vascular: Carotid bruit (Cannot exclude true carotid bruit versus referred aortic murmur) present. No hepatojugular reflux or JVD.  Cardiovascular:     Rate and Rhythm: Normal rate and regular rhythm. No extrasystoles are present.    Chest Wall: PMI is not displaced.     Pulses: Decreased pulses.          Femoral pulses are  on the right side with bruit and  on the left side with bruit.      Popliteal pulses are 1+ on the right side and 1+ on the left side.       Dorsalis pedis pulses are 1+ on the right side  and 1+ on the left side.       Posterior tibial  pulses are 0 on the right side and 0 on the left side.     Heart sounds: Murmur (2/6C-D SEM at RUSB--neck.) heard.  High-pitched blowing holosystolic murmur of grade 2/6 is also present at the apex radiating to the axilla.    No friction rub. No gallop.  Pulmonary:     Effort: Pulmonary effort is normal. No respiratory distress.     Comments: Mild diffuse interstitial sounds/crackles but no wheezes rales or rhonchi. Chest:     Chest wall: No tenderness.  Musculoskeletal:        General: No swelling. Normal range of motion.     Cervical back: Normal range of motion and neck supple.  Skin:    General: Skin is warm and dry.  Neurological:     General: No focal deficit present.     Mental Status: She is alert and oriented to person, place, and time.  Psychiatric:        Behavior: Behavior normal.        Thought Content: Thought content normal.        Judgment: Judgment normal.     Comments: She still seems a little bit down admitted.     Adult ECG Report Not checked  Recent Labs:  ordered on clinic day - reviewed 5/17 Lab Results  Component Value Date   CHOL 145 08/08/2020   HDL 41 08/08/2020   LDLCALC 75 08/08/2020   TRIG 170 (H) 08/08/2020   CHOLHDL 3.5 08/08/2020   Lab Results  Component Value Date   CREATININE 1.24 (H) 08/08/2020   BUN 22 08/08/2020   NA 139 08/08/2020   K 4.2 08/08/2020   CL 102 08/08/2020   CO2 20 08/08/2020   CBC Latest Ref Rng & Units 08/08/2020 06/27/2020 11/26/2018  WBC 3.4 - 10.8 x10E3/uL 9.7 11.0(H) 9.0  Hemoglobin 11.1 - 15.9 g/dL 13.3 13.8 12.8  Hematocrit 34.0 - 46.6 % 40.8 40.9 40.6  Platelets 150 - 450 x10E3/uL 204 206 186    Lab Results  Component Value Date   TSH 1.620 01/14/2018    ==================================================  COVID-19 Education: The signs and symptoms of COVID-19 were discussed with the patient and how to seek care for testing (follow up with PCP or arrange  E-visit).    I spent a total of 21 min with the patient spent in direct patient consultation.  Additional time spent with chart review  / charting (studies, outside notes, etc): 40 min -- multiple studies & PV procedures with [name] clinic visits reviewed. -- PMH/PSH updated with extensive new results of PAD, SMA disease & RA stenosis, Renal Cysts.  Total Time: 61 min  Current medicines are reviewed at length with the patient today.  (+/- concerns) n/a  This visit occurred during the SARS-CoV-2 public health emergency.  Safety protocols were in place, including screening questions prior to the visit, additional usage of staff PPE, and extensive cleaning of exam room while observing appropriate contact time as indicated for disinfecting solutions.  Notice: This dictation was prepared with Dragon dictation along with smaller phrase technology. Any transcriptional errors that result from this process are unintentional and may not be corrected upon review.  Patient Instructions / Medication Changes & Studies & Tests Ordered   Patient Instructions  Medication Instructions:  No changes *If you need a refill on your cardiac medications before your next appointment, please call your pharmacy*   Lab Work: tomorrow  Lipid- fasting Cbc cmp If you have labs (blood work) drawn  today and your tests are completely normal, you will receive your results only by: MyChart Message (if you have MyChart) OR A paper copy in the mail If you have any lab test that is abnormal or we need to change your treatment, we will call you to review the results.   Testing/Procedures: Not needed   Follow-Up: At [name], you and your health needs are our priority.  As part of our continuing mission to provide you with exceptional heart care, we have created designated Provider Care Teams.  These Care Teams include your primary Cardiologist (physician) and Advanced Practice Providers (APPs -  Physician  Assistants and Nurse Practitioners) who all work together to provide you with the care you need, when you need it.  We recommend signing up for the patient portal called "MyChart".  Sign up information is provided on this After Visit Summary.  MyChart is used to connect with patients for Virtual Visits (Telemedicine).  Patients are able to view lab/test results, encounter notes, upcoming appointments, etc.  Non-urgent messages can be sent to your provider as well.   To learn more about what you can do with MyChart, go to [email or web address].    Your next appointment:   12 month(s)  The format for your next appointment:   In Person  Provider:   Glenetta Hew, MD   Other Instructions   Studies Ordered:   Orders Placed This Encounter  Procedures   Lipid panel   Comprehensive metabolic panel   CBC     Alexandra [name], M.D., M.S. Interventional Cardiologist   Pager # [phone] Phone # [phone] [address] [place], [place] [address]   Thank you for choosing Heartcare at [facility]!!

## 2020-08-07 NOTE — Patient Instructions (Signed)
Medication Instructions:  No changes *If you need a refill on your cardiac medications before your next appointment, please call your pharmacy*   Lab Work: tomorrow  Lipid- fasting Cbc cmp If you have labs (blood work) drawn today and your tests are completely normal, you will receive your results only by: [name] MyChart Message (if you have MyChart) OR . A paper copy in the mail If you have any lab test that is abnormal or we need to change your treatment, we will call you to review the results.   Testing/Procedures: Not needed   Follow-Up: At [facility], you and your health needs are our priority.  As part of our continuing mission to provide you with exceptional heart care, we have created designated Provider Care Teams.  These Care Teams include your primary Cardiologist (physician) and Advanced Practice Providers (APPs -  Physician Assistants and Nurse Practitioners) who all work together to provide you with the care you need, when you need it.  We recommend signing up for the patient portal called "MyChart".  Sign up information is provided on this After Visit Summary.  MyChart is used to connect with patients for Virtual Visits (Telemedicine).  Patients are able to view lab/test results, encounter notes, upcoming appointments, etc.  Non-urgent messages can be sent to your provider as well.   To learn more about what you can do with MyChart, go to [email or web address].    Your next appointment:   12 month(s)  The format for your next appointment:   In Person  Provider:   Glenetta Hew, MD   Other Instructions

## 2020-08-08 ENCOUNTER — Encounter: Payer: Self-pay | Admitting: Cardiovascular Disease

## 2020-08-08 ENCOUNTER — Ambulatory Visit (INDEPENDENT_AMBULATORY_CARE_PROVIDER_SITE_OTHER): Payer: Medicare HMO | Admitting: Cardiovascular Disease

## 2020-08-08 VITALS — BP 142/98 | HR 59 | Ht 66.0 in | Wt 164.4 lb

## 2020-08-08 DIAGNOSIS — I10 Essential (primary) hypertension: Secondary | ICD-10-CM

## 2020-08-08 DIAGNOSIS — E785 Hyperlipidemia, unspecified: Secondary | ICD-10-CM | POA: Diagnosis not present

## 2020-08-08 DIAGNOSIS — I2510 Atherosclerotic heart disease of native coronary artery without angina pectoris: Secondary | ICD-10-CM

## 2020-08-08 DIAGNOSIS — I701 Atherosclerosis of renal artery: Secondary | ICD-10-CM

## 2020-08-08 DIAGNOSIS — Z720 Tobacco use: Secondary | ICD-10-CM

## 2020-08-08 DIAGNOSIS — I739 Peripheral vascular disease, unspecified: Secondary | ICD-10-CM | POA: Diagnosis not present

## 2020-08-08 DIAGNOSIS — Z952 Presence of prosthetic heart valve: Secondary | ICD-10-CM

## 2020-08-08 LAB — LIPID PANEL
Chol/HDL Ratio: 3.5 ratio (ref 0.0–4.4)
Cholesterol, Total: 145 mg/dL (ref 100–199)
HDL: 41 mg/dL (ref >39)
LDL Chol Calc (NIH): 75 mg/dL (ref 0–99)
Triglycerides: 170 mg/dL — ABNORMAL HIGH (ref 0–149)
VLDL Cholesterol Cal: 29 mg/dL (ref 5–40)

## 2020-08-08 LAB — CBC
Hematocrit: 40.8 % (ref 34.0–46.6)
Hemoglobin: 13.3 g/dL (ref 11.1–15.9)
MCH: 29.4 pg (ref 26.6–33.0)
MCHC: 32.6 g/dL (ref 31.5–35.7)
MCV: 90 fL (ref 79–97)
Platelets: 204 10*3/uL (ref 150–450)
RBC: 4.53 x10E6/uL (ref 3.77–5.28)
RDW: 12.8 % (ref 11.7–15.4)
WBC: 9.7 10*3/uL (ref 3.4–10.8)

## 2020-08-08 LAB — COMPREHENSIVE METABOLIC PANEL
ALT: 6 IU/L (ref 0–32)
AST: 13 IU/L (ref 0–40)
Albumin/Globulin Ratio: 2 (ref 1.2–2.2)
Albumin: 4.5 g/dL (ref 3.7–4.7)
Alkaline Phosphatase: 148 IU/L — ABNORMAL HIGH (ref 44–121)
BUN/Creatinine Ratio: 18 (ref 12–28)
BUN: 22 mg/dL (ref 8–27)
Bilirubin Total: 0.3 mg/dL (ref 0.0–1.2)
CO2: 20 mmol/L (ref 20–29)
Calcium: 9.3 mg/dL (ref 8.7–10.3)
Chloride: 102 mmol/L (ref 96–106)
Creatinine, Ser: 1.24 mg/dL — ABNORMAL HIGH (ref 0.57–1.00)
Globulin, Total: 2.3 g/dL (ref 1.5–4.5)
Glucose: 123 mg/dL — ABNORMAL HIGH (ref 65–99)
Potassium: 4.2 mmol/L (ref 3.5–5.2)
Sodium: 139 mmol/L (ref 134–144)
Total Protein: 6.8 g/dL (ref 6.0–8.5)
eGFR: 47 mL/min/{1.73_m2} — ABNORMAL LOW (ref >59)

## 2020-08-08 NOTE — Patient Instructions (Signed)
Medication Instructions:  No changes *If you need a refill on your cardiac medications before your next appointment, please call your pharmacy*   Lab Work: None ordered If you have labs (blood work) drawn today and your tests are completely normal, you will receive your results only by: MyChart Message (if you have MyChart) OR A paper copy in the mail If you have any lab test that is abnormal or we need to change your treatment, we will call you to review the results.   Testing/Procedures: None ordered   Follow-Up: At [facility], you and your health needs are our priority.  As part of our continuing mission to provide you with exceptional heart care, we have created designated Provider Care Teams.  These Care Teams include your primary Cardiologist (physician) and Advanced Practice Providers (APPs -  Physician Assistants and Nurse Practitioners) who all work together to provide you with the care you need, when you need it.  We recommend signing up for the patient portal called "MyChart".  Sign up information is provided on this After Visit Summary.  MyChart is used to connect with patients for Virtual Visits (Telemedicine).  Patients are able to view lab/test results, encounter notes, upcoming appointments, etc.  Non-urgent messages can be sent to your provider as well.   To learn more about what you can do with MyChart, go to [email or web address].    Your next appointment:   3 month(s)  The format for your next appointment:   In Person  Provider:   Muhammad Arida, MD   

## 2020-08-08 NOTE — Progress Notes (Signed)
Cardiology Office Note   Date:  08/08/2020   ID:  Alexandra [name], DOB 06/03/1950, MRN [id number]  PCP:  [facility]  Cardiologist: Dr. Ellyn Hack  No chief complaint on file.     History of Present Illness: Alexandra [name] is a 70 y.o. female who is here today for follow-up visit regarding peripheral arterial disease and renal artery stenosis.    She has known history of coronary artery disease, aortic valve disease status post TAVR, COPD, tobacco use, chronic diastolic heart failure, resistant hypertension and pulmonary hypertension.  She had previous injuries and surgeries to both legs with chronic pain.  She was seen recently for severe bilateral calf claudication worse on the right side. She had recent noninvasive vascular studies in February which showed an ABI of 0.49 on the right and 0.57 on the left.  Duplex showed severe stenosis in the distal right common iliac artery with occluded SFA and occluded right posterior tibial artery.  On the left side, there was moderate iliac disease with occluded SFA and posterior tibial artery. I proceeded with angiography in April which showed severe bilateral renal artery stenosis but the left kidney was noted to be atrophied.  There was severe stenosis in the right common iliac artery and moderate left iliac artery disease.  Both SFAs were occluded with collaterals.  I performed self-expanding stent placement to the distal right common iliac artery into the external iliac artery.  Renal artery duplex was done which confirmed severe bilateral renal artery stenosis.  However, the left kidney was only 7.8 cm in length. The patient is on 4 different antihypertensive medications and blood pressure is still not controlled. She reports improvement in right leg claudication but she has experienced worsening swelling.    Past Medical History:  Diagnosis Date  . Arthritis   . COPD (chronic obstructive pulmonary disease)  ([place]) 05/22/2016   Severely decreased DLCO by PFTs in April 2019 but only mild obstructive disease.   . Coronary artery disease, non-occlusive 11/28/2016   R&LHC: 65% distal LAD lesion -- likely not angiographically significant->medical management. Normal LVEF. Moderately elevated LVEDP. I aortic valve gradient in the Cath Lab, moderate aortic stenosis. This would suggest that the stenosis is on the moderate side of moderate to severe.  Severe pulmonary hypertension (likely mixed).  AVA 0.96 cm; P-P gradient ~ 30 mmHg, Mean ~24.5-27.5 (Moderate AS)   . GERD (gastroesophageal reflux disease)   . Heart murmur   . Hx of adenomatous colonic polyps 10/19/2015  . Hyperlipidemia   . Hypertension   . Moderate mitral regurgitation by prior echocardiography 10/2016   Echocardiogram revealed moderate mitral regurgitation; most recent Echo 02/12/2019- MAC with moderate MR  . Pulmonary hypertension associated Aortic Stenosis, Mitral Regurgitation & COPD    Peak PAP by Echo ~54 mmHg;  by Cath PAP/Mean 71/32 mmHg, 53 mmHg (with PCWP & LVEDP ~24-26 mmHg); TPG ~26 mmHg  . Pulmonary nodules   . S/P TAVR (transcatheter aortic valve replacement) 12/30/2017   Edward's Sapien 3 bioprosthetic THB via TF approach  . Severe aortic stenosis 2018   Underwent TAVR in 2019    Past Surgical History:  Procedure Laterality Date  . ABDOMINAL AORTOGRAM W/LOWER EXTREMITY N/A 07/12/2020   Procedure: ABDOMINAL AORTOGRAM W/LOWER EXTREMITY;  Surgeon: Wellington Hampshire, MD;  Location: [place] [facility];  Service: Cardiovascular;  Laterality: N/A;  . APPENDECTOMY    . BREAST CYST EXCISION Right 1970  . CARDIAC VALVE REPLACEMENT    .  CATARACT EXTRACTION W/ INTRAOCULAR LENS IMPLANT Right   . CHOLECYSTECTOMY OPEN    . COLONOSCOPY  2003, 2017  . CTA Chest  09/2016   Thoracic Aortic Ca2+ w/o dilation.  Coronary Calcification noted. PA normal. Mild Emphysematous changes w/ mild LLL scarring.    [name] FRACTURE SURGERY     2007 -right  tib /fib fracture ----07/2009 right femur fx after a fall  . FRACTURE SURGERY Right 2011   Femur  . PERIPHERAL VASCULAR CATHETERIZATION N/A 08/09/2014   Procedure: Aortic Arch Angiography;  Surgeon: Serafina Mitchell, MD;  Location: [place] [facility];  Service: Cardiovascular: Type 1 Arch. No significant stenosis, aneurysmal degeneration or dissection.  No luminal irregularity seen in R or L SubClavian, Brachial or Radial A. Chronic distal ulnar A occlusion [place].  [name] PERIPHERAL VASCULAR CATHETERIZATION Right 08/09/2014   Procedure: Upper Extremity Angiography;  Surgeon: Serafina Mitchell, MD;  Location: [place] [facility];  Service: Cardiovascular;  Laterality: Right;  . PERIPHERAL VASCULAR INTERVENTION  07/12/2020   Procedure: PERIPHERAL VASCULAR INTERVENTION;  Surgeon: Wellington Hampshire, MD;  Location: [place] [facility];  Service: Cardiovascular;;  rt iliac  . RIGHT/LEFT HEART CATH AND CORONARY ANGIOGRAPHY N/A 12/06/2016   Procedure: RIGHT/LEFT HEART CATH AND CORONARY ANGIOGRAPHY;  Surgeon: Leonie Man, MD;  [facility]: [facility]: Cor Angio: 65% dLAD (Med Rx). AVA 0.96 cm; P-P gradient ~ 30 mmHg, Mean ~24.5-27.5 (Mod AS); RHC #s: RAP 8 mmHg, RVP/EDP: 71/8/15 mmHg, PCWP: 21-24 mmHg, PAP/mean: 71/32/52 mmHg = Severe Mixed Pulmonary HTN; LVP/EDP 205/17/26 mmHg ; CO/CI by Fick: 4.35, 2.44 - mildly reduced  . RIGHT/LEFT HEART CATH AND CORONARY ANGIOGRAPHY N/A 11/20/2017   Procedure: RIGHT/LEFT HEART CATH AND CORONARY ANGIOGRAPHY;  Surgeon: Larey Dresser, MD;  Location: [place] [facility];  Service: Cardiovascular;  Laterality: N/A;  . TEE WITHOUT CARDIOVERSION N/A 12/30/2017   Procedure: (Intra-Op) TRANSESOPHAGEAL ECHOCARDIOGRAM (TEE);  Surgeon: Burnell Blanks, MD;  Location: [place];  Service: Open Heart Surgery; Post TAVR good position of 23 mm AP and 3 valve.  No new or WMA.  EF 55-60%.  No effusion.  Mild perivalvular regurgitation seen.  Mean gradient 7 mmHg.  Peak 16 mmHg.  [name] TOTAL  KNEE ARTHROPLASTY Left 12/01/2018   Procedure: Left Knee Arthroplasty;  Surgeon: Melrose Nakayama, MD;  Location: [facility];  Service: Orthopedics;  Laterality: Left;  . TRANSCATHETER AORTIC VALVE REPLACEMENT, TRANSFEMORAL  12/30/2017  . TRANSCATHETER AORTIC VALVE REPLACEMENT, TRANSFEMORAL N/A 12/30/2017   Procedure: TRANSCATHETER AORTIC VALVE REPLACEMENT, TRANSFEMORAL;  Surgeon: Burnell Blanks, MD;  Location: [place];  Service: Open Heart Surgery;  Laterality: N/A;  . TRANSTHORACIC ECHOCARDIOGRAM  10/30/2016   Mild Concentric LVH. EF 55-60%. No RWMA, Gr 2 DD. Mod-Severe AS (mean-peak Gradient 31 mmHG - 64 mmHg), Mod MR. Mod LA dilation, Mod PA HTN - peak pressure ~54 mmHg). = Progression of AS from 2014  . TRANSTHORACIC ECHOCARDIOGRAM  01/2019   EF 55 to 60%.  Elevated LVEDP.  GR 1 DD.  No R WMA.  Moderate LA dilation.  Well-seated stented bioprosthetic aortic valve.  Mean gradient 13 mercury.  Moderate MAC moderate MR.  No evidence of mildly elevated RAP/CVP and moderately elevated PA P  . TRANSTHORACIC ECHOCARDIOGRAM  10/2017   a) Pre-TAVR 10/31/17: EF 55 to 60%.  GR 1 DD.  Calcific severe AS.  Peak Grad 62 mmHg, mean 32 mmHg.  Progression of Dz.  AVA ~0.8 cm.; b) Post TAVR 10/9/'19: EF 60-65%.  GR 1 DD.  Stable appearing 23 mm Edwards's Sapien AV bioprosthesis.  No perivalvular AI.  Mean Grad 22 mmHg; c) 11/13/'19: AoV mean Grad 19 mmHg,, Peak 36 mmHg. PAP ~33 mmHg  . ULTRASOUND GUIDANCE FOR VASCULAR ACCESS  08/09/2014   Procedure: Ultrasound Guidance For Vascular Access;  Surgeon: Serafina Mitchell, MD;  Location: [place] [facility];  Service: Cardiovascular;;     Current Outpatient Medications  Medication Sig Dispense Refill  . amLODipine (NORVASC) 10 MG tablet Take 10 mg by mouth daily.    [name] aspirin 81 MG tablet Take 1 tablet (81 mg total) by mouth 2 (two) times daily at 10 AM and 5 PM. 30 tablet 0  . Azelastine HCl 0.15 % SOLN Place 1 spray into both nostrils daily as needed (Congestion).     . bisoprolol-hydrochlorothiazide (ZIAC) 10-6.25 MG tablet TAKE 2 TABLETS BY MOUTH DAILY 60 tablet 6  . Cholecalciferol (VITAMIN D) 50 MCG (2000 UT) CAPS Take 4,000 Units by mouth daily.    . clopidogrel (PLAVIX) 75 MG tablet Take 1 tablet (75 mg total) by mouth daily. 30 tablet 6  . diclofenac Sodium (VOLTAREN) 1 % GEL Apply 2 g topically daily as needed (Pain).    [name] esomeprazole (NEXIUM) 40 MG capsule Take 1 capsule (40 mg total) by mouth daily at 12 noon. (Patient taking differently: Take 40 mg by mouth daily as needed (Heartburn).) 90 capsule 2  . furosemide (LASIX) 20 MG tablet TAKE 1 TABLET BY MOUTH DAILY AS NEEDED FOR INCREASE SWELLING 30 tablet 6  . guaiFENesin (MUCINEX) 600 MG 12 hr tablet Take 1 tablet (600 mg total) by mouth 2 (two) times daily. (Patient taking differently: Take 600 mg by mouth 2 (two) times daily as needed for to loosen phlegm.) 60 tablet 11  . losartan (COZAAR) 100 MG tablet Take 100 mg by mouth daily.    [name] oxyCODONE-acetaminophen (PERCOCET) 10-325 MG tablet Take 1 tablet by mouth every 4 (four) hours as needed for pain.    . potassium chloride SA (KLOR-CON) 20 MEQ tablet Take 1 tablet (20 mEq total) by mouth daily. (Patient taking differently: Take 10 mEq by mouth daily.) 90 tablet 3  . rosuvastatin (CRESTOR) 10 MG tablet Take 10 mg by mouth daily.    [name] tiZANidine (ZANAFLEX) 4 MG tablet Take 4 mg by mouth daily as needed for muscle spasms.     No current facility-administered medications for this visit.    Allergies:   Codeine and Symproic [naldemedine]    Social History:  The patient  reports that she has been smoking cigarettes. She has a 4.00 pack-year smoking history. She has never used smokeless tobacco. She reports previous alcohol use. She reports that she does not use drugs.   Family History:  The patient's family history includes Heart attack in her brother; Heart disease in her father and mother; Hypertension in her mother.    ROS:  Please see the  history of present illness.   Otherwise, review of systems are positive for none.   All other systems are reviewed and negative.    PHYSICAL EXAM: VS:  BP (!) 142/98   Pulse (!) 59   Ht _0  (1.676 m)   Wt 164 lb 6.4 oz (74.6 kg)   SpO2 100%   BMI 26.53 kg/m  , BMI Body mass index is 26.53 kg/m. GEN: Well nourished, well developed, in no acute distress  HEENT: normal  Neck: no JVD, bilateral carotid bruits, or masses Cardiac:  RRR; no  rubs, or gallops, 2 out of 6 systolic murmur in the aortic area.  Mild bilateral leg edema Respiratory:  clear to auscultation bilaterally, normal work of breathing GI: soft, nontender, nondistended, + BS MS: no deformity or atrophy  Skin: warm and dry, no rash Neuro:  Strength and sensation are intact Psych: euthymic mood, full affect Vascular: Radial pulses normal bilaterally.  Femoral pulse is normal bilaterally with no hematoma.  Distal pulses are not palpable   EKG:  EKG is not ordered today.    Recent Labs: 06/27/2020: BUN 18; Creatinine, Ser 1.13; Hemoglobin 13.8; Platelets 206; Potassium 4.8; Sodium 138    Lipid Panel    Component Value Date/Time   CHOL 137 07/23/2019 1001   TRIG 104 07/23/2019 1001   HDL 46 07/23/2019 1001   CHOLHDL 3.0 07/23/2019 1001   CHOLHDL 3.2 03/05/2018 1135   VLDL 22 03/05/2018 1135   LDLCALC 72 07/23/2019 1001      Wt Readings from Last 3 Encounters:  08/08/20 164 lb 6.4 oz (74.6 kg)  08/07/20 164 lb (74.4 kg)  07/12/20 163 lb (73.9 kg)       PAD Screen 11/28/2016  Previous PAD dx? No  Previous surgical procedure? No  Pain with walking? Yes  Subsides with rest? Yes  Feet/toe relief with dangling? No  Painful, non-healing ulcers? No  Extremities discolored? No      ASSESSMENT AND PLAN:  1.  Peripheral arterial disease with severe bilateral leg claudication worse on the right side with some rest pain at night.  Status post recent self-expanding stent placement to the right iliac artery  with excellent results.  Femoral pulses now normal on the right side.  She does have chronically occluded SFAs with collaterals and this will be treated medically for now.  Continue dual antiplatelet therapy.  2.  Bilateral renal artery stenosis: The left kidney appears to be likely nonviable given the size.  Currently she has underlying chronic kidney disease and refractory hypertension and spite of 4 different antihypertensive medications.  Thus, there is an indication for renal artery revascularization and this will be addressed in the next few months.  Given hostile abdominal aorta, radial artery access should be considered.  3.   Coronary artery disease involving native coronary artery without angina: She seems to be doing well from a cardiac standpoint.  Continue medical therapy.  4.  Status post TAVR: Stable.  5.  Hyperlipidemia: Continue treatment with rosuvastatin.  Most recent lipid profile showed an LDL of 72.  6.  Tobacco use: She cut down to 2 cigarettes a day.  I explained to her the importance of complete cessation.    Disposition:   FU with me in 3 months  Signed,  [name], MD  08/08/2020 10:54 AM    [place]

## 2020-08-11 ENCOUNTER — Telehealth: Payer: Self-pay | Admitting: *Deleted

## 2020-08-11 NOTE — Telephone Encounter (Signed)
Patient made aware of results and verbalized understanding.  

## 2020-08-11 NOTE — Telephone Encounter (Signed)
Left message for patient to call back for results.  

## 2020-08-11 NOTE — Telephone Encounter (Signed)
-----   Message from Leonie Man, MD sent at 08/08/2020  6:28 PM EDT ----- Lab results show pretty stable kidney function and electrolyte levels.  Normal liver function.  Cholesterol levels are also pretty stable.  LDL is up a little bit from last visit as our triglycerides.  LDL 75 up from 72, triglycerides 170, up from 104.  I would like to see if she could potentially tolerate increasing her rosuvastatin to 20 mg at least 3 days a week.  Kidney function is pretty stable.  Blood sugar levels are little high.   Glenetta Hew, MD

## 2020-08-11 NOTE — Telephone Encounter (Signed)
Patient returning call.

## 2020-08-16 MED ORDER — ROSUVASTATIN CALCIUM 20 MG PO TABS: 20.0000 mg | ORAL_TABLET | Freq: Every day | ORAL | 3 refills | Status: DC

## 2020-08-16 NOTE — Telephone Encounter (Signed)
Left message to call back  

## 2020-08-16 NOTE — Telephone Encounter (Signed)
Spoke to patient . She states she was taking 10 mg  Rosuvastatin .  patient will double the dose until she is through with bottle then start taking 20 mg  Daily   Per Dr Ellyn Hack if unable to tolerate daily try to take at least 3 times week of 20 mg and the other days take 10 mg  Patient verbalized understanding New order e-sent to pharmacy

## 2020-09-01 ENCOUNTER — Encounter: Payer: Self-pay | Admitting: Cardiology

## 2020-09-01 NOTE — Assessment & Plan Note (Signed)
Current LDL 75 with recent previous LDL was 72.  She is only on 10 mg rosuvastatin.  Plan for now will be to try to increase to 20 mg daily and reassess in 3 to 4 months.  If she not able to tolerate higher dose of statin, would probably add ezetimibe, but low threshold to consider PCSK9 inhibitor.

## 2020-09-01 NOTE — Assessment & Plan Note (Signed)
--  prosthetic valve seems doing very well.  Only mild paravalvular leak with mean gradient of 16 mmHg.  Normal EF 60 to 65%.

## 2020-09-01 NOTE — Assessment & Plan Note (Signed)
Into her with the extensiveShe is down to 1 or 2 cigarettes a day, but is not able to make the final step.  I tried explained to her that with extensive PAD and CAD that she has, it will only get worse if she continues to smoke.  I think she is using this as a emotional crutch.

## 2020-09-01 NOTE — Assessment & Plan Note (Signed)
Overall seems to doing better.  Echo shows preserved EF with only grade 1 diastolic function.  I think diastolic function is also worsened by MR.  She has difficult to manage resistant hypertension on multiple medications, and now newly documented renal artery stenosis.  Thankfully, her renal function is stable.  Plan: Continue aggressive blood pressure management with Ziac, amlodipine, and losartan at current doses.  She is also on to 3 days a week standing furosemide with PRN dosing. ->  Hopefully as her renal artery stenosis is treated, we will the other get some more blood pressure control.

## 2020-09-01 NOTE — Assessment & Plan Note (Signed)
Last visit, her blood pressure was better, but the most recent evaluations have shown elevated pressures on multiple medications including max dose losartan, amlodipine and pretty high dose of bisoprolol-HCTZ (Ziac).  Plan per [name] is to consider right renal artery stenting.  Left kidney seems to be atretic and not salvageable.  I will hold off any medication adjustments at this time until renal artery disease is treated.

## 2020-09-01 NOTE — Assessment & Plan Note (Signed)
Combination of underlying lung disease and valvular heart disease with persistent MR.  Pulmonary pressures are only mildly elevated on recent echo.  Continue blood pressure management notably with calcium channel blocker and diuretics.  She is on low-dose HCTZ with Ziac and then 2 to 3 days a week taking furosemide.

## 2020-09-01 NOTE — Assessment & Plan Note (Signed)
Distal LAD 65% stenosis seen on cath but no other significant disease noted although there is extensive calcification seen on CT scan.  Is also associated with significant PAD, abdominal aortic disease, renal artery stenosis.  She is on beta-blocker and ARB along with amlodipine with no active anginal symptoms.  She remains on aspirin and Plavix.  Plavix reportedly only for 1 month, but with renal artery disease I would continue Plavix long-term.  We can therefore reduce aspirin to 1 tablet daily.  Aggressive risk factor modification -> we just check lipids that are reviewed at the time of this dictation with LDL of 75. ->  Now that we have her claudication under control, I would like to increase her rosuvastatin to 20 mg daily.  Recheck lipids in 3 to 4 months.

## 2020-09-01 NOTE — Assessment & Plan Note (Signed)
Multiple studies reviewed above.  Being followed by [name].  Status post PTA-stent of right iliac artery with improved claudication.  Known bilateral SFA and PTA occlusions being treated medically for now.  Will defer to [name] - but anticipate potential use of cilostazol.  RF modification as noted for CAD.  On aspirin and Plavix. -Would probably continue Plavix longer than just 1 month based on the severity of her disease.  Can reduce aspirin to 1 tablet daily.

## 2020-09-01 NOTE — Assessment & Plan Note (Addendum)
S/p TAVR in Oct 2019

## 2020-10-24 ENCOUNTER — Other Ambulatory Visit: Payer: Self-pay | Admitting: Cardiology

## 2020-10-31 ENCOUNTER — Ambulatory Visit: Payer: Medicare HMO | Admitting: Cardiovascular Disease

## 2020-11-02 ENCOUNTER — Ambulatory Visit: Payer: Medicare HMO | Admitting: Cardiology

## 2020-12-28 ENCOUNTER — Other Ambulatory Visit: Payer: Self-pay | Admitting: Cardiology

## 2020-12-29 ENCOUNTER — Other Ambulatory Visit: Payer: Self-pay | Admitting: Cardiology

## 2021-01-22 ENCOUNTER — Other Ambulatory Visit: Payer: Self-pay | Admitting: Cardiology

## 2021-02-01 ENCOUNTER — Other Ambulatory Visit: Payer: Self-pay | Admitting: Cardiology

## 2021-02-02 ENCOUNTER — Other Ambulatory Visit: Payer: Self-pay | Admitting: Cardiology

## 2021-02-08 ENCOUNTER — Other Ambulatory Visit: Payer: Self-pay

## 2021-02-08 ENCOUNTER — Ambulatory Visit (HOSPITAL_COMMUNITY)
Admission: RE | Admit: 2021-02-08 | Discharge: 2021-02-08 | Disposition: A | Discharge status: Home / Self Care | Payer: Medicare HMO | Source: Ambulatory Visit | Attending: Cardiology | Admitting: Cardiology

## 2021-02-08 ENCOUNTER — Ambulatory Visit (HOSPITAL_BASED_OUTPATIENT_CLINIC_OR_DEPARTMENT_OTHER)
Admission: RE | Admit: 2021-02-08 | Discharge: 2021-02-08 | Disposition: A | Discharge status: Home / Self Care | Payer: Medicare HMO | Source: Ambulatory Visit | Attending: Cardiovascular Disease | Admitting: Cardiovascular Disease

## 2021-02-08 ENCOUNTER — Other Ambulatory Visit (HOSPITAL_COMMUNITY): Payer: Self-pay | Admitting: Cardiovascular Disease

## 2021-02-08 DIAGNOSIS — I739 Peripheral vascular disease, unspecified: Secondary | ICD-10-CM

## 2021-02-08 DIAGNOSIS — Z95828 Presence of other vascular implants and grafts: Secondary | ICD-10-CM

## 2021-02-28 ENCOUNTER — Other Ambulatory Visit: Payer: Self-pay | Admitting: Cardiovascular Disease

## 2021-03-01 NOTE — Telephone Encounter (Signed)
Refill Request.  

## 2021-03-06 ENCOUNTER — Ambulatory Visit: Payer: Medicare HMO | Admitting: Cardiovascular Disease

## 2021-03-06 NOTE — Progress Notes (Deleted)
Cardiology Office Note   Date:  03/06/2021   ID:  Alexandra [name], DOB 1950/09/04, MRN [id number]  PCP:  [facility]  Cardiologist: Dr. Ellyn Hack  No chief complaint on file.     History of Present Illness: Alexandra [name] is a 70 y.o. female who is here today for follow-up visit regarding peripheral arterial disease and renal artery stenosis.    She has known history of coronary artery disease, aortic valve disease status post TAVR, COPD, tobacco use, chronic diastolic heart failure, resistant hypertension and pulmonary hypertension.  She had previous injuries and surgeries to both legs with chronic pain.  She was seen recently for severe bilateral calf claudication worse on the right side. She had recent noninvasive vascular studies in February which showed an ABI of 0.49 on the right and 0.57 on the left.  Duplex showed severe stenosis in the distal right common iliac artery with occluded SFA and occluded right posterior tibial artery.  On the left side, there was moderate iliac disease with occluded SFA and posterior tibial artery. I proceeded with angiography in April which showed severe bilateral renal artery stenosis but the left kidney was noted to be atrophied.  There was severe stenosis in the right common iliac artery and moderate left iliac artery disease.  Both SFAs were occluded with collaterals.  I performed self-expanding stent placement to the distal right common iliac artery into the external iliac artery.  Renal artery duplex was done which confirmed severe bilateral renal artery stenosis.  However, the left kidney was only 7.8 cm in length. The patient is on 4 different antihypertensive medications and blood pressure is still not controlled. She reports improvement in right leg claudication but she has experienced worsening swelling.    Past Medical History:  Diagnosis Date   Arthritis    COPD (chronic obstructive pulmonary disease)  ([place]) 05/22/2016   Severely decreased DLCO by PFTs in April 2019 but only mild obstructive disease.    Coronary artery disease, non-occlusive 11/28/2016   R&LHC: 65% distal LAD lesion -- likely not angiographically significant->medical management. Normal LVEF. Moderately elevated LVEDP. I aortic valve gradient in the Cath Lab, moderate aortic stenosis. This would suggest that the stenosis is on the moderate side of moderate to severe.  Severe pulmonary hypertension (likely mixed).  AVA 0.96 cm; P-P gradient ~ 30 mmHg, Mean ~24.5-27.5 (Moderate AS)    GERD (gastroesophageal reflux disease)    Heart murmur    Hx of adenomatous colonic polyps 10/19/2015   Hyperlipidemia    Hypertension    Moderate mitral regurgitation by prior echocardiography 10/2016   Echocardiogram revealed moderate mitral regurgitation; most recent Echo 02/12/2019- MAC with moderate MR   Multiple renal cysts 06/2020   Renal A Dopplers: Exophytic Cyst noted in Upper pole of Right Kidney (1.6x1.7x1.7 cm); Exophytic cysts: lower pole of Left Kidney 1.9 x 1.7 x 1.9 cm, and 1.4 x 1.2 x 1.5 cm.   PAOD (peripheral arterial occlusive disease) ([place]) 06/2020   Abd AoGram-BLE Runoff: Severe Bilat RA stenosis - small. atretic L RA. Severe stenosis of R Com Iliac A - Ext Iliac A (PTA-Stent). Bilateral SFA flush CTO. Mod R CFA, L Iliac & CFA. Bilateral SFA reconstitution distal Pop A. R PTA flush CTO 2 V runoff. L LE 3 V runoff unitl distal LPTA CTO.   Small Infrarenal aneurysmal areas with extensive atheroma - no obstructive disease   Pulmonary hypertension associated Aortic Stenosis, Mitral Regurgitation & COPD  Peak PAP by Echo ~54 mmHg;  by Cath PAP/Mean 71/32 mmHg, 53 mmHg (with PCWP & LVEDP ~24-26 mmHg); TPG ~26 mmHg   Pulmonary nodules    Renal artery stenosis, native, bilateral ([facility]) 06/2020   Abd Ao Gram: LRA ~ 99% ostial stenosis with small atretic L Kidney (f/u dopplers - Abnormal L kidney size & Resistive Index); R RA ostial  80-90% stenosis (confirmed by doppler Resistive Index, but normal R kidney size.   S/P TAVR (transcatheter aortic valve replacement) 12/30/2017   Edward's Sapien 3 bioprosthetic THB via TF approach   Severe aortic stenosis 2018   Underwent TAVR in 2019   Superior mesenteric artery stenosis ([place]) 06/2020   Abdominal Ao Dopplers - 70-90% Ostial SMA stenosis with normal Celiac A.    Past Surgical History:  Procedure Laterality Date   ABDOMINAL AORTOGRAM W/LOWER EXTREMITY N/A 07/12/2020   Procedure: ABDOMINAL AORTOGRAM W/LOWER EXTREMITY;  Surgeon: Wellington Hampshire, MD;  [facility]: [facility];; Severe B-RA stenosis - sm/atretic L RA. Severe Dz R Com-Ext Iliac A (PTA-Stent). B-SFA flush CTO. Mod R CFA, L Iliac-CFA. B-SFA recon @ dPop A. R PTA flush CTO & 2 V runoff. L LE 3 V runoff w/ distal LPTA CTO.   Sm InfraRenal aneurysms w/ extensive atheroma - no obstructive Dz   APPENDECTOMY     BREAST CYST EXCISION Right 1970   CARDIAC VALVE REPLACEMENT     CATARACT EXTRACTION W/ INTRAOCULAR LENS IMPLANT Right    CHOLECYSTECTOMY OPEN     COLONOSCOPY  2003, 2017   CTA Chest  09/2016   Thoracic Aortic Ca2+ w/o dilation.  Coronary Calcification noted. PA normal. Mild Emphysematous changes w/ mild LLL scarring.     FRACTURE SURGERY     2007 -right tib /fib fracture ----07/2009 right femur fx after a fall   FRACTURE SURGERY Right 2011   Femur   PERIPHERAL VASCULAR CATHETERIZATION N/A 08/09/2014   Procedure: Aortic Arch Angiography;  Surgeon: Serafina Mitchell, MD;  Location: [facility];  Service: Cardiovascular: Type 1 Arch. No significant stenosis, aneurysmal degeneration or dissection.  No luminal irregularity seen in R or L SubClavian, Brachial or Radial A. Chronic distal ulnar A occlusion Bilatera.   PERIPHERAL VASCULAR CATHETERIZATION Right 08/09/2014   Procedure: Upper Extremity Angiography;  Surgeon: Serafina Mitchell, MD;  Location: [facility];  Service: Cardiovascular;   Laterality: Right;   PERIPHERAL VASCULAR INTERVENTION  07/12/2020   Procedure: PERIPHERAL VASCULAR INTERVENTION;  Surgeon: Wellington Hampshire, MD;  Location: [facility];  Service: Cardiovascular;;  R Common-External Iliac A PTA & Stent   RIGHT/LEFT HEART CATH AND CORONARY ANGIOGRAPHY N/A 12/06/2016   Procedure: RIGHT/LEFT HEART CATH AND CORONARY ANGIOGRAPHY;  Surgeon: Leonie Man, MD;  Location: [facility]: Cor Angio: 65% dLAD (Med Rx). AVA 0.96 cm; P-P gradient ~ 30 mmHg, Mean ~24.5-27.5 (Mod AS); RHC #s: RAP 8 mmHg, RVP/EDP: 71/8/15 mmHg, PCWP: 21-24 mmHg, PAP/mean: 71/32/52 mmHg = Severe Mixed Pulmonary HTN; LVP/EDP 205/17/26 mmHg ; CO/CI by Fick: 4.35, 2.44 - mildly reduced   RIGHT/LEFT HEART CATH AND CORONARY ANGIOGRAPHY N/A 11/20/2017   Procedure: RIGHT/LEFT HEART CATH AND CORONARY ANGIOGRAPHY;  Surgeon: Larey Dresser, MD;  Location: [facility];  Service: Cardiovascular;  Laterality: N/A;   TEE WITHOUT CARDIOVERSION N/A 12/30/2017   Procedure: (Intra-Op) TRANSESOPHAGEAL ECHOCARDIOGRAM (TEE);  Surgeon: Burnell Blanks, MD;  Location: [facility];  Service: Open Heart Surgery; Post TAVR good position of 23  mm AP and 3 valve.  No new or WMA.  EF 55-60%.  No effusion.  Mild perivalvular regurgitation seen.  Mean gradient 7 mmHg.  Peak 16 mmHg.   TOTAL KNEE ARTHROPLASTY Left 12/01/2018   Procedure: Left Knee Arthroplasty;  Surgeon: Melrose Nakayama, MD;  Location: [facility];  Service: Orthopedics;  Laterality: Left;   TRANSCATHETER AORTIC VALVE REPLACEMENT, TRANSFEMORAL N/A 12/30/2017   Procedure: TRANSCATHETER AORTIC VALVE REPLACEMENT, TRANSFEMORAL;  Surgeon: Burnell Blanks, MD;  Location: [facility];  Service: Open Heart Surgery;  Laterality: N/A; 23 mm Edwards SAPIEN 3 THV   TRANSTHORACIC ECHOCARDIOGRAM  10/30/2016   Mild Concentric LVH. EF 55-60%. No RWMA, Gr 2 DD. Mod-Severe AS (mean-peak Gradient 31 mmHG - 64 mmHg), Mod MR. Mod LA dilation, Mod PA HTN - peak  pressure ~54 mmHg). = Progression of AS from 2014   TRANSTHORACIC ECHOCARDIOGRAM  01/2019   EF 55 to 60%.  Elevated LVEDP.  GR 1 DD.  No R WMA.  Moderate LA dilation.  Well-seated stented bioprosthetic aortic valve.  Mean gradient 13 mercury.  Moderate MAC moderate MR.  No evidence of mildly elevated RAP/CVP and moderately elevated PA P   TRANSTHORACIC ECHOCARDIOGRAM  10/2017   a) Pre-TAVR 10/31/17: EF 55 to 60%.  GR 1 DD.  Calcific severe AS.  Peak Grad 62 mmHg, mean 32 mmHg.  Progression of Dz.  AVA ~0.8 cm.; b) Post TAVR 10/9/'19: EF 60-65%.  GR 1 DD.  Stable appearing 23 mm Edwards's Sapien AV bioprosthesis.  No perivalvular AI.  Mean Grad 22 mmHg; c) 11/13/'19: AoV mean Grad 19 mmHg,, Peak 36 mmHg. PAP ~33 mmHg   TRANSTHORACIC ECHOCARDIOGRAM  08/02/2020   EF 60 to 65%.  GR 1 DD.  Elevated LAP/LVEDP.  Mildly elevated PAP.  Mild LA dilation.  23 mm SAPIEN 3 prosthetic TAVR valve present.  Mild perivalvular leak.  Mean gradient 16 mmHg. Mod MR.   ULTRASOUND GUIDANCE FOR VASCULAR ACCESS  08/09/2014   Procedure: Ultrasound Guidance For Vascular Access;  Surgeon: Serafina Mitchell, MD;  Location: [facility];  Service: Cardiovascular;;     Current Outpatient Medications  Medication Sig Dispense Refill   amLODipine (NORVASC) 10 MG tablet Take 10 mg by mouth daily.     aspirin 81 MG tablet Take 1 tablet (81 mg total) by mouth 2 (two) times daily at 10 AM and 5 PM. 30 tablet 0   Azelastine HCl 0.15 % SOLN Place 1 spray into both nostrils daily as needed (Congestion).     bisoprolol-hydrochlorothiazide (ZIAC) 10-6.25 MG tablet TAKE 2 TABLETS BY MOUTH DAILY 180 tablet 1   Cholecalciferol (VITAMIN D) 50 MCG (2000 UT) CAPS Take 4,000 Units by mouth daily.     clopidogrel (PLAVIX) 75 MG tablet TAKE 1 TABLET(75 MG) BY MOUTH DAILY 30 tablet 6   diclofenac Sodium (VOLTAREN) 1 % GEL Apply 2 g topically daily as needed (Pain).     esomeprazole (NEXIUM) 40 MG capsule TAKE 1 CAPSULE BY MOUTH DAILY AT NOON  90 capsule 2   furosemide (LASIX) 20 MG tablet TAKE 1 TABLET BY MOUTH DAILY AS NEEDED FOR INCREASE SWELLING 30 tablet 6   guaiFENesin (MUCINEX) 600 MG 12 hr tablet Take 1 tablet (600 mg total) by mouth 2 (two) times daily. (Patient taking differently: Take 600 mg by mouth 2 (two) times daily as needed for to loosen phlegm.) 60 tablet 11   losartan (COZAAR) 100 MG tablet Take 100 mg by mouth daily.  oxyCODONE-acetaminophen (PERCOCET) 10-325 MG tablet Take 1 tablet by mouth every 4 (four) hours as needed for pain.     potassium chloride SA (KLOR-CON) 20 MEQ tablet TAKE 1 TABLET(20 MEQ) BY MOUTH DAILY 90 tablet 3   rosuvastatin (CRESTOR) 20 MG tablet Take 1 tablet (20 mg total) by mouth daily. 90 tablet 3   tiZANidine (ZANAFLEX) 4 MG tablet Take 4 mg by mouth daily as needed for muscle spasms.     No current facility-administered medications for this visit.    Allergies:   Codeine and Symproic [naldemedine]    Social History:  The patient  reports that she has been smoking cigarettes. She has a 4.00 pack-year smoking history. She has never used smokeless tobacco. She reports that she does not currently use alcohol. She reports that she does not use drugs.   Family History:  The patient's family history includes Heart attack in her brother; Heart disease in her father and mother; Hypertension in her mother.    ROS:  Please see the history of present illness.   Otherwise, review of systems are positive for none.   All other systems are reviewed and negative.    PHYSICAL EXAM: VS:  There were no vitals taken for this visit. , BMI There is no height or weight on file to calculate BMI. GEN: Well nourished, well developed, in no acute distress  HEENT: normal  Neck: no JVD, bilateral carotid bruits, or masses Cardiac: RRR; no  rubs, or gallops, 2 out of 6 systolic murmur in the aortic area.  Mild bilateral leg edema Respiratory:  clear to auscultation bilaterally, normal work of breathing GI:  soft, nontender, nondistended, + BS MS: no deformity or atrophy  Skin: warm and dry, no rash Neuro:  Strength and sensation are intact Psych: euthymic mood, full affect Vascular: Radial pulses normal bilaterally.  Femoral pulse is normal bilaterally with no hematoma.  Distal pulses are not palpable   EKG:  EKG is not ordered today.    Recent Labs: 08/08/2020: ALT 6; BUN 22; Creatinine, Ser 1.24; Hemoglobin 13.3; Platelets 204; Potassium 4.2; Sodium 139    Lipid Panel    Component Value Date/Time   CHOL 145 08/08/2020 1058   TRIG 170 (H) 08/08/2020 1058   HDL 41 08/08/2020 1058   CHOLHDL 3.5 08/08/2020 1058   CHOLHDL 3.2 03/05/2018 1135   VLDL 22 03/05/2018 1135   LDLCALC 75 08/08/2020 1058      Wt Readings from Last 3 Encounters:  08/08/20 164 lb 6.4 oz (74.6 kg)  08/07/20 164 lb (74.4 kg)  07/12/20 163 lb (73.9 kg)       PAD Screen 11/28/2016  Previous PAD dx? No  Previous surgical procedure? No  Pain with walking? Yes  Subsides with rest? Yes  Feet/toe relief with dangling? No  Painful, non-healing ulcers? No  Extremities discolored? No      ASSESSMENT AND PLAN:  1.  Peripheral arterial disease with severe bilateral leg claudication worse on the right side with some rest pain at night.  Status post recent self-expanding stent placement to the right iliac artery with excellent results.  Femoral pulses now normal on the right side.  She does have chronically occluded SFAs with collaterals and this will be treated medically for now.  Continue dual antiplatelet therapy.  2.  Bilateral renal artery stenosis: The left kidney appears to be likely nonviable given the size.  Currently she has underlying chronic kidney disease and refractory hypertension and spite of 4  different antihypertensive medications.  Thus, there is an indication for renal artery revascularization and this will be addressed in the next few months.  Given hostile abdominal aorta, radial artery access  should be considered.  3.   Coronary artery disease involving native coronary artery without angina: She seems to be doing well from a cardiac standpoint.  Continue medical therapy.  4.  Status post TAVR: Stable.  5.  Hyperlipidemia: Continue treatment with rosuvastatin.  Most recent lipid profile showed an LDL of 72.  6.  Tobacco use: She cut down to 2 cigarettes a day.  I explained to her the importance of complete cessation.    Disposition:   FU with me in 3 months  Signed,  [name], MD  03/06/2021 10:24 AM    [place]

## 2021-03-15 ENCOUNTER — Encounter: Payer: Self-pay | Admitting: Internal Medicine

## 2021-05-07 NOTE — Progress Notes (Signed)
Cardiology Office Note   Date:  05/08/2021   ID:  Alexandra [name], DOB 1950/09/30, MRN [id number]  PCP:  [facility]  Cardiologist: Dr. Ellyn Hack  Chief Complaint  Patient presents with   Follow-up   Edema    Ankles and legs.      History of Present Illness: Alexandra [name] is a 71 y.o. female who is here today for follow-up visit regarding peripheral arterial disease and renal artery stenosis.    She has known history of coronary artery disease, aortic valve disease status post TAVR, COPD, tobacco use, chronic diastolic heart failure, resistant hypertension and pulmonary hypertension.  She had previous injuries and surgeries to both legs with chronic pain.  She was seen last year for severe bilateral calf claudication worse on the right side.  She was found to have bilateral SFA occlusion with severe disease affecting the right common iliac artery which was treated successfully with self-expanding stent placement.  During angiography, she was noted to have severe bilateral renal artery stenosis with atrophied left kidney.   She reports stable bilateral calf claudication.  She fell in December which caused some musculoskeletal pain but she is starting to feel better.  Her blood pressure has been reasonably controlled on 4 antihypertensive medications.  No symptoms of heart failure.     Past Medical History:  Diagnosis Date   Arthritis    COPD (chronic obstructive pulmonary disease) ([place]) 05/22/2016   Severely decreased DLCO by PFTs in April 2019 but only mild obstructive disease.    Coronary artery disease, non-occlusive 11/28/2016   R&LHC: 65% distal LAD lesion -- likely not angiographically significant->medical management. Normal LVEF. Moderately elevated LVEDP. I aortic valve gradient in the Cath Lab, moderate aortic stenosis. This would suggest that the stenosis is on the moderate side of moderate to severe.  Severe pulmonary hypertension (likely  mixed).  AVA 0.96 cm; P-P gradient ~ 30 mmHg, Mean ~24.5-27.5 (Moderate AS)    GERD (gastroesophageal reflux disease)    Heart murmur    Hx of adenomatous colonic polyps 10/19/2015   Hyperlipidemia    Hypertension    Moderate mitral regurgitation by prior echocardiography 10/2016   Echocardiogram revealed moderate mitral regurgitation; most recent Echo 02/12/2019- MAC with moderate MR   Multiple renal cysts 06/2020   Renal A Dopplers: Exophytic Cyst noted in Upper pole of Right Kidney (1.6x1.7x1.7 cm); Exophytic cysts: lower pole of Left Kidney 1.9 x 1.7 x 1.9 cm, and 1.4 x 1.2 x 1.5 cm.   PAOD (peripheral arterial occlusive disease) ([place]) 06/2020   Abd AoGram-BLE Runoff: Severe Bilat RA stenosis - small. atretic L RA. Severe stenosis of R Com Iliac A - Ext Iliac A (PTA-Stent). Bilateral SFA flush CTO. Mod R CFA, L Iliac & CFA. Bilateral SFA reconstitution distal Pop A. R PTA flush CTO 2 V runoff. L LE 3 V runoff unitl distal LPTA CTO.   Small Infrarenal aneurysmal areas with extensive atheroma - no obstructive disease   Pulmonary hypertension associated Aortic Stenosis, Mitral Regurgitation & COPD    Peak PAP by Echo ~54 mmHg;  by Cath PAP/Mean 71/32 mmHg, 53 mmHg (with PCWP & LVEDP ~24-26 mmHg); TPG ~26 mmHg   Pulmonary nodules    Renal artery stenosis, native, bilateral ([facility]) 06/2020   Abd Ao Gram: LRA ~ 99% ostial stenosis with small atretic L Kidney (f/u dopplers - Abnormal L kidney size & Resistive Index); R RA ostial 80-90% stenosis (confirmed by doppler Resistive  Index, but normal R kidney size.   S/P TAVR (transcatheter aortic valve replacement) 12/30/2017   Edward's Sapien 3 bioprosthetic THB via TF approach   Severe aortic stenosis 2018   Underwent TAVR in 2019   Superior mesenteric artery stenosis ([place]) 06/2020   Abdominal Ao Dopplers - 70-90% Ostial SMA stenosis with normal Celiac A.    Past Surgical History:  Procedure Laterality Date   ABDOMINAL AORTOGRAM W/LOWER EXTREMITY  N/A 07/12/2020   Procedure: ABDOMINAL AORTOGRAM W/LOWER EXTREMITY;  Surgeon: Wellington Hampshire, MD;  [facility]: [facility];; Severe B-RA stenosis - sm/atretic L RA. Severe Dz R Com-Ext Iliac A (PTA-Stent). B-SFA flush CTO. Mod R CFA, L Iliac-CFA. B-SFA recon @ dPop A. R PTA flush CTO & 2 V runoff. L LE 3 V runoff w/ distal LPTA CTO.   Sm InfraRenal aneurysms w/ extensive atheroma - no obstructive Dz   APPENDECTOMY     BREAST CYST EXCISION Right 1970   CARDIAC VALVE REPLACEMENT     CATARACT EXTRACTION W/ INTRAOCULAR LENS IMPLANT Right    CHOLECYSTECTOMY OPEN     COLONOSCOPY  2003, 2017   CTA Chest  09/2016   Thoracic Aortic Ca2+ w/o dilation.  Coronary Calcification noted. PA normal. Mild Emphysematous changes w/ mild LLL scarring.     FRACTURE SURGERY     2007 -right tib /fib fracture ----07/2009 right femur fx after a fall   FRACTURE SURGERY Right 2011   Femur   PERIPHERAL VASCULAR CATHETERIZATION N/A 08/09/2014   Procedure: Aortic Arch Angiography;  Surgeon: Serafina Mitchell, MD;  Location: [facility] [facility];  Service: Cardiovascular: Type 1 Arch. No significant stenosis, aneurysmal degeneration or dissection.  No luminal irregularity seen in R or L SubClavian, Brachial or Radial A. Chronic distal ulnar A occlusion [place].   PERIPHERAL VASCULAR CATHETERIZATION Right 08/09/2014   Procedure: Upper Extremity Angiography;  Surgeon: Serafina Mitchell, MD;  Location: [facility];  Service: Cardiovascular;  Laterality: Right;   PERIPHERAL VASCULAR INTERVENTION  07/12/2020   Procedure: PERIPHERAL VASCULAR INTERVENTION;  Surgeon: Wellington Hampshire, MD;  Location: [facility] [facility];  Service: Cardiovascular;;  R Common-External Iliac A PTA & Stent   RIGHT/LEFT HEART CATH AND CORONARY ANGIOGRAPHY N/A 12/06/2016   Procedure: RIGHT/LEFT HEART CATH AND CORONARY ANGIOGRAPHY;  Surgeon: Leonie Man, MD;  [facility]: [facility]: Cor Angio: 65% dLAD (Med Rx). AVA 0.96 cm; P-P gradient ~  30 mmHg, Mean ~24.5-27.5 (Mod AS); RHC #s: RAP 8 mmHg, RVP/EDP: 71/8/15 mmHg, PCWP: 21-24 mmHg, PAP/mean: 71/32/52 mmHg = Severe Mixed Pulmonary HTN; LVP/EDP 205/17/26 mmHg ; CO/CI by Fick: 4.35, 2.44 - mildly reduced   RIGHT/LEFT HEART CATH AND CORONARY ANGIOGRAPHY N/A 11/20/2017   Procedure: RIGHT/LEFT HEART CATH AND CORONARY ANGIOGRAPHY;  Surgeon: Larey Dresser, MD;  Location: [facility];  Service: Cardiovascular;  Laterality: N/A;   TEE WITHOUT CARDIOVERSION N/A 12/30/2017   Procedure: (Intra-Op) TRANSESOPHAGEAL ECHOCARDIOGRAM (TEE);  Surgeon: Burnell Blanks, MD;  Location: [facility];  Service: Open Heart Surgery; Post TAVR good position of 23 mm AP and 3 valve.  No new or WMA.  EF 55-60%.  No effusion.  Mild perivalvular regurgitation seen.  Mean gradient 7 mmHg.  Peak 16 mmHg.   TOTAL KNEE ARTHROPLASTY Left 12/01/2018   Procedure: Left Knee Arthroplasty;  Surgeon: Melrose Nakayama, MD;  Location: [facility];  Service: Orthopedics;  Laterality: Left;   TRANSCATHETER AORTIC VALVE REPLACEMENT, TRANSFEMORAL N/A 12/30/2017   Procedure: TRANSCATHETER  AORTIC VALVE REPLACEMENT, TRANSFEMORAL;  Surgeon: Burnell Blanks, MD;  Location: [facility];  Service: Open Heart Surgery;  Laterality: N/A; 23 mm Edwards SAPIEN 3 THV   TRANSTHORACIC ECHOCARDIOGRAM  10/30/2016   Mild Concentric LVH. EF 55-60%. No RWMA, Gr 2 DD. Mod-Severe AS (mean-peak Gradient 31 mmHG - 64 mmHg), Mod MR. Mod LA dilation, Mod PA HTN - peak pressure ~54 mmHg). = Progression of AS from 2014   TRANSTHORACIC ECHOCARDIOGRAM  01/2019   EF 55 to 60%.  Elevated LVEDP.  GR 1 DD.  No R WMA.  Moderate LA dilation.  Well-seated stented bioprosthetic aortic valve.  Mean gradient 13 mercury.  Moderate MAC moderate MR.  No evidence of mildly elevated RAP/CVP and moderately elevated PA P   TRANSTHORACIC ECHOCARDIOGRAM  10/2017   a) Pre-TAVR 10/31/17: EF 55 to 60%.  GR 1 DD.  Calcific severe AS.  Peak Grad 62 mmHg, mean 32 mmHg.  Progression  of Dz.  AVA ~0.8 cm.; b) Post TAVR 10/9/'19: EF 60-65%.  GR 1 DD.  Stable appearing 23 mm Edwards's Sapien AV bioprosthesis.  No perivalvular AI.  Mean Grad 22 mmHg; c) 11/13/'19: AoV mean Grad 19 mmHg,, Peak 36 mmHg. PAP ~33 mmHg   TRANSTHORACIC ECHOCARDIOGRAM  08/02/2020   EF 60 to 65%.  GR 1 DD.  Elevated LAP/LVEDP.  Mildly elevated PAP.  Mild LA dilation.  23 mm SAPIEN 3 prosthetic TAVR valve present.  Mild perivalvular leak.  Mean gradient 16 mmHg. Mod MR.   ULTRASOUND GUIDANCE FOR VASCULAR ACCESS  08/09/2014   Procedure: Ultrasound Guidance For Vascular Access;  Surgeon: Serafina Mitchell, MD;  Location: [facility];  Service: Cardiovascular;;     Current Outpatient Medications  Medication Sig Dispense Refill   amLODipine (NORVASC) 10 MG tablet Take 10 mg by mouth daily.     aspirin 81 MG tablet Take 1 tablet (81 mg total) by mouth 2 (two) times daily at 10 AM and 5 PM. 30 tablet 0   Azelastine HCl 0.15 % SOLN Place 1 spray into both nostrils daily as needed (Congestion).     bisoprolol-hydrochlorothiazide (ZIAC) 10-6.25 MG tablet TAKE 2 TABLETS BY MOUTH DAILY 180 tablet 1   Cholecalciferol (VITAMIN D) 50 MCG (2000 UT) CAPS Take 4,000 Units by mouth daily.     clopidogrel (PLAVIX) 75 MG tablet TAKE 1 TABLET(75 MG) BY MOUTH DAILY 30 tablet 6   diclofenac Sodium (VOLTAREN) 1 % GEL Apply 2 g topically daily as needed (Pain).     esomeprazole (NEXIUM) 40 MG capsule TAKE 1 CAPSULE BY MOUTH DAILY AT NOON 90 capsule 2   furosemide (LASIX) 20 MG tablet TAKE 1 TABLET BY MOUTH DAILY AS NEEDED FOR INCREASE SWELLING 30 tablet 6   guaiFENesin (MUCINEX) 600 MG 12 hr tablet Take 1 tablet (600 mg total) by mouth 2 (two) times daily. (Patient taking differently: Take 600 mg by mouth 2 (two) times daily as needed for to loosen phlegm.) 60 tablet 11   losartan (COZAAR) 100 MG tablet Take 100 mg by mouth daily.     oxyCODONE-acetaminophen (PERCOCET) 10-325 MG tablet Take 1 tablet by mouth every 4 (four)  hours as needed for pain.     potassium chloride SA (KLOR-CON) 20 MEQ tablet TAKE 1 TABLET(20 MEQ) BY MOUTH DAILY 90 tablet 3   tiZANidine (ZANAFLEX) 4 MG tablet Take 4 mg by mouth daily as needed for muscle spasms.     rosuvastatin (CRESTOR) 20 MG tablet Take 1 tablet (20  mg total) by mouth daily. 90 tablet 3   No current facility-administered medications for this visit.    Allergies:   Codeine and Symproic [naldemedine]    Social History:  The patient  reports that she has been smoking cigarettes. She has a 4.00 pack-year smoking history. She has never used smokeless tobacco. She reports that she does not currently use alcohol. She reports that she does not use drugs.   Family History:  The patient's family history includes Heart attack in her brother; Heart disease in her father and mother; Hypertension in her mother.    ROS:  Please see the history of present illness.   Otherwise, review of systems are positive for none.   All other systems are reviewed and negative.    PHYSICAL EXAM: VS:  BP 132/76 (BP Location: Left Arm, Patient Position: Sitting, Cuff Size: Normal)    Pulse 80    Ht _0  (1.651 m)    Wt 164 lb (74.4 kg)    BMI 27.29 kg/m  , BMI Body mass index is 27.29 kg/m. GEN: Well nourished, well developed, in no acute distress  HEENT: normal  Neck: no JVD, bilateral carotid bruits, or masses Cardiac: RRR; no  rubs, or gallops, 2 /6 systolic murmur in the aortic area.  Mild bilateral leg edema Respiratory:  clear to auscultation bilaterally, normal work of breathing GI: soft, nontender, nondistended, + BS MS: no deformity or atrophy  Skin: warm and dry, no rash Neuro:  Strength and sensation are intact Psych: euthymic mood, full affect Vascular: Radial pulses normal bilaterally.     EKG:  EKG is  ordered today. EKG showed normal sinus rhythm with left bundle branch block.   Recent Labs: 08/08/2020: ALT 6; BUN 22; Creatinine, Ser 1.24; Hemoglobin 13.3; Platelets  204; Potassium 4.2; Sodium 139    Lipid Panel    Component Value Date/Time   CHOL 145 08/08/2020 1058   TRIG 170 (H) 08/08/2020 1058   HDL 41 08/08/2020 1058   CHOLHDL 3.5 08/08/2020 1058   CHOLHDL 3.2 03/05/2018 1135   VLDL 22 03/05/2018 1135   LDLCALC 75 08/08/2020 1058      Wt Readings from Last 3 Encounters:  05/08/21 164 lb (74.4 kg)  08/08/20 164 lb 6.4 oz (74.6 kg)  08/07/20 164 lb (74.4 kg)       PAD Screen 11/28/2016  Previous PAD dx? No  Previous surgical procedure? No  Pain with walking? Yes  Subsides with rest? Yes  Feet/toe relief with dangling? No  Painful, non-healing ulcers? No  Extremities discolored? No      ASSESSMENT AND PLAN:  1.  Peripheral arterial disease :  Status post self-expanding stent placement to the right iliac artery.  She does have chronically occluded SFAs with collaterals and this will be treated medically for now.  Continue dual antiplatelet therapy.  Most recent Doppler in November showed stable ABI of 0.68 bilaterally with patent right common iliac artery stent.  2.  Bilateral renal artery stenosis: The left kidney appears to be likely nonviable given the size.  Her blood pressure is controlled on current medications and she does not have heart failure symptoms.  I am going to check basic metabolic profile to ensure stability of renal function.  Currently, she does not meet indication for revascularization unless her renal function is worsening compared to last year.  We do have to monitor her situation closely though given that she seems to have atrophied left kidney and only 1  viable kidney.  3.   Coronary artery disease involving native coronary artery without angina: She seems to be doing well from a cardiac standpoint.  Continue medical therapy.  4.  Status post TAVR: Stable.  5.  Hyperlipidemia: Continue treatment with rosuvastatin.  Most recent lipid profile showed an LDL of 72.  6.  Tobacco use: She cut down to 2 cigarettes  a day.  I explained to her the importance of complete cessation.    Disposition:   FU with me in 6 months  Signed,  [name], MD  05/08/2021 8:32 AM    [place]

## 2021-05-08 ENCOUNTER — Encounter: Payer: Self-pay | Admitting: Cardiovascular Disease

## 2021-05-08 ENCOUNTER — Other Ambulatory Visit: Payer: Self-pay

## 2021-05-08 ENCOUNTER — Ambulatory Visit: Payer: Medicare Other | Admitting: Cardiovascular Disease

## 2021-05-08 VITALS — BP 132/76 | HR 80 | Ht 65.0 in | Wt 164.0 lb

## 2021-05-08 DIAGNOSIS — I2510 Atherosclerotic heart disease of native coronary artery without angina pectoris: Secondary | ICD-10-CM

## 2021-05-08 DIAGNOSIS — I701 Atherosclerosis of renal artery: Secondary | ICD-10-CM

## 2021-05-08 DIAGNOSIS — I739 Peripheral vascular disease, unspecified: Secondary | ICD-10-CM

## 2021-05-08 DIAGNOSIS — E785 Hyperlipidemia, unspecified: Secondary | ICD-10-CM | POA: Diagnosis not present

## 2021-05-08 DIAGNOSIS — Z720 Tobacco use: Secondary | ICD-10-CM

## 2021-05-08 LAB — BASIC METABOLIC PANEL
BUN/Creatinine Ratio: 12 (ref 12–28)
BUN: 16 mg/dL (ref 8–27)
CO2: 23 mmol/L (ref 20–29)
Calcium: 9.2 mg/dL (ref 8.7–10.3)
Chloride: 102 mmol/L (ref 96–106)
Creatinine, Ser: 1.39 mg/dL — ABNORMAL HIGH (ref 0.57–1.00)
Glucose: 95 mg/dL (ref 70–99)
Potassium: 4.6 mmol/L (ref 3.5–5.2)
Sodium: 138 mmol/L (ref 134–144)
eGFR: 41 mL/min/{1.73_m2} — ABNORMAL LOW (ref >59)

## 2021-05-08 NOTE — Patient Instructions (Signed)
Medication Instructions:  No changes *If you need a refill on your cardiac medications before your next appointment, please call your pharmacy*   Lab Work: Your provider would like for you to have the following labs today: BMET  If you have labs (blood work) drawn today and your tests are completely normal, you will receive your results only by: [place] (if you have MyChart) OR A paper copy in the mail If you have any lab test that is abnormal or we need to change your treatment, we will call you to review the results.   Testing/Procedures: None ordered   Follow-Up: At [facility], you and your health needs are our priority.  As part of our continuing mission to provide you with exceptional heart care, we have created designated Provider Care Teams.  These Care Teams include your primary Cardiologist (physician) and Advanced Practice Providers (APPs -  Physician Assistants and Nurse Practitioners) who all work together to provide you with the care you need, when you need it.  We recommend signing up for the patient portal called "MyChart".  Sign up information is provided on this After Visit Summary.  MyChart is used to connect with patients for Virtual Visits (Telemedicine).  Patients are able to view lab/test results, encounter notes, upcoming appointments, etc.  Non-urgent messages can be sent to your provider as well.   To learn more about what you can do with MyChart, go to [email or web address].    Your next appointment:   6 month(s)  The format for your next appointment:   In Person  Provider:   [name]

## 2021-05-17 ENCOUNTER — Telehealth: Payer: Self-pay | Admitting: *Deleted

## 2021-05-17 DIAGNOSIS — I701 Atherosclerosis of renal artery: Secondary | ICD-10-CM

## 2021-05-17 NOTE — Telephone Encounter (Signed)
-----   Message from Wellington Hampshire, MD sent at 05/10/2021  4:40 PM EST ----- Renal function continues to worsen slowly.  Due to that, I recommend that we go ahead and proceed with renal artery angiography and possible stenting of the right renal artery.  I can do on March 1 or the week after.  Thanks.

## 2021-05-17 NOTE — Telephone Encounter (Signed)
Spoke with the patient. She stated that she would rather wait until 06/06/21 for the procedure. She has verbalized her understanding of the instructions.  Instructions will be mailed to her as well.   You are scheduled for a Peripheral Angiogram on Wednesday, March 15 with Dr. [name].  1. Please arrive at the [facility] (Main Entrance A) at [facility]: [address] [place], [place] [address] at 6:30 AM (This time is two hours before your procedure to ensure your preparation). Free valet parking service is available.   Special note: Every effort is made to have your procedure done on time. Please understand that emergencies sometimes delay scheduled procedures.  2. Diet: Do not eat solid foods after midnight.  The patient may have clear liquids until 5am upon the day of the procedure.  3. Labs:  Your provider would like for you to return  05/31/21 to have the following labs drawn: BMET AND CBC. You do not need an appointment for the lab. You do not need to be fasting. Once in our office lobby there is a podium where you can sign in and ring the doorbell to alert [place] that you are here. The lab is open from 8:00 am to 4:30 pm; closed for lunch from 12:45pm-1:45pm.  4. Medication instructions in preparation for your procedure: Hold the furosemide and the potassium the morning of the procedure  On the morning of your procedure, take your Aspirin and Plavix/Clopidogrel and any morning medicines NOT listed above.  You may use sips of water.  5. Plan for one night stay--bring personal belongings. 6. Bring a current list of your medications and current insurance cards. 7. You MUST have a responsible person to drive you home. 8. Someone MUST be with you the first 24 hours after you arrive home or your discharge will be delayed. 9. Please wear clothes that are easy to get on and off and wear slip-on shoes.  Thank you for allowing [place] to care for you!   -- [facility]

## 2021-05-21 ENCOUNTER — Encounter: Payer: Self-pay | Admitting: *Deleted

## 2021-05-31 LAB — CBC

## 2021-06-01 LAB — BASIC METABOLIC PANEL
BUN/Creatinine Ratio: 16 (ref 12–28)
BUN: 19 mg/dL (ref 8–27)
CO2: 23 mmol/L (ref 20–29)
Calcium: 9.1 mg/dL (ref 8.7–10.3)
Chloride: 100 mmol/L (ref 96–106)
Creatinine, Ser: 1.21 mg/dL — ABNORMAL HIGH (ref 0.57–1.00)
Glucose: 105 mg/dL — ABNORMAL HIGH (ref 70–99)
Potassium: 4.7 mmol/L (ref 3.5–5.2)
Sodium: 137 mmol/L (ref 134–144)
eGFR: 48 mL/min/{1.73_m2} — ABNORMAL LOW (ref >59)

## 2021-06-01 LAB — CBC
Hematocrit: 37.7 % (ref 34.0–46.6)
Hemoglobin: 12.2 g/dL (ref 11.1–15.9)
MCH: 29.1 pg (ref 26.6–33.0)
MCHC: 32.4 g/dL (ref 31.5–35.7)
MCV: 90 fL (ref 79–97)
Platelets: 238 10*3/uL (ref 150–450)
RBC: 4.19 x10E6/uL (ref 3.77–5.28)
RDW: 12.8 % (ref 11.7–15.4)
WBC: 10.1 10*3/uL (ref 3.4–10.8)

## 2021-06-05 ENCOUNTER — Telehealth: Payer: Self-pay | Admitting: *Deleted

## 2021-06-05 NOTE — Telephone Encounter (Addendum)
Renal Angiogram scheduled at [facility] for: Wednesday June 06, 2021 8:30 AM ?Arrival time and place: [place] Entrance A at: 6:30 AM ? ?No solid food after midnight prior to cath, clear liquids until 5 AM day of procedure. ? ?Medication instructions: ?-Hold: ? Lasix/Bisoprolol-HCT/KCl/day before and day of procedure -per protocol GFR 48 ?-Except hold medications usual morning medications can be taken with sips of water including aspirin 81 mg and Plavix 75 mg. ? ?Confirmed patient has responsible adult to drive home post procedure and be with patient first 24 hours after arriving home: ? ?One visitor is allowed to stay in the waiting room during the time you are at the hospital for your procedure.  ? ?Patient reports no symptoms concerning for COVID-19 in the past 10 days. ? ?Reviewed procedure instructions with patient.  ? ? ? ?

## 2021-06-05 NOTE — Telephone Encounter (Addendum)
05/31/21 GFR 48-per protocol-pt should hold losartan AM of procedure-pt notified. ? ? ?

## 2021-06-06 ENCOUNTER — Other Ambulatory Visit: Payer: Self-pay

## 2021-06-06 ENCOUNTER — Ambulatory Visit (HOSPITAL_COMMUNITY)
Admission: RE | Admit: 2021-06-06 | Discharge: 2021-06-06 | Disposition: A | Discharge status: Home / Self Care | Payer: Medicare Other | Attending: Cardiovascular Disease | Admitting: Cardiovascular Disease

## 2021-06-06 ENCOUNTER — Encounter (HOSPITAL_COMMUNITY)
Admission: RE | Disposition: A | Discharge status: Home / Self Care | Payer: Self-pay | Source: Home / Self Care | Attending: Cardiovascular Disease

## 2021-06-06 DIAGNOSIS — F17210 Nicotine dependence, cigarettes, uncomplicated: Secondary | ICD-10-CM | POA: Diagnosis not present

## 2021-06-06 DIAGNOSIS — I272 Pulmonary hypertension, unspecified: Secondary | ICD-10-CM | POA: Diagnosis not present

## 2021-06-06 DIAGNOSIS — I701 Atherosclerosis of renal artery: Secondary | ICD-10-CM | POA: Insufficient documentation

## 2021-06-06 DIAGNOSIS — Z952 Presence of prosthetic heart valve: Secondary | ICD-10-CM | POA: Diagnosis not present

## 2021-06-06 DIAGNOSIS — I110 Hypertensive heart disease with heart failure: Secondary | ICD-10-CM | POA: Insufficient documentation

## 2021-06-06 DIAGNOSIS — Z7982 Long term (current) use of aspirin: Secondary | ICD-10-CM | POA: Insufficient documentation

## 2021-06-06 DIAGNOSIS — J449 Chronic obstructive pulmonary disease, unspecified: Secondary | ICD-10-CM | POA: Insufficient documentation

## 2021-06-06 DIAGNOSIS — I739 Peripheral vascular disease, unspecified: Secondary | ICD-10-CM | POA: Diagnosis not present

## 2021-06-06 DIAGNOSIS — I5032 Chronic diastolic (congestive) heart failure: Secondary | ICD-10-CM | POA: Diagnosis not present

## 2021-06-06 DIAGNOSIS — Z7902 Long term (current) use of antithrombotics/antiplatelets: Secondary | ICD-10-CM | POA: Insufficient documentation

## 2021-06-06 DIAGNOSIS — Z79899 Other long term (current) drug therapy: Secondary | ICD-10-CM | POA: Diagnosis not present

## 2021-06-06 DIAGNOSIS — I2510 Atherosclerotic heart disease of native coronary artery without angina pectoris: Secondary | ICD-10-CM | POA: Diagnosis not present

## 2021-06-06 LAB — POCT ACTIVATED CLOTTING TIME: Activated Clotting Time: 359 seconds

## 2021-06-06 SURGERY — RENAL INTERVENTION
Anesthesia: LOCAL

## 2021-06-06 MED ORDER — SODIUM CHLORIDE 0.9 % WEIGHT BASED INFUSION: 1.0000 mL/kg/h | INTRAVENOUS | Status: DC

## 2021-06-06 MED ORDER — SODIUM CHLORIDE 0.9 % IV SOLN: INTRAVENOUS | Status: DC

## 2021-06-06 MED ORDER — LABETALOL HCL 5 MG/ML IV SOLN: 10.0000 mg | INTRAVENOUS | Status: DC | PRN

## 2021-06-06 MED ORDER — FENTANYL CITRATE (PF) 100 MCG/2ML IJ SOLN
INTRAMUSCULAR | Status: DC | PRN
  Administered 2021-06-06: 50 ug via INTRAVENOUS

## 2021-06-06 MED ORDER — LIDOCAINE HCL (PF) 1 % IJ SOLN
INTRAMUSCULAR | Status: DC | PRN
  Administered 2021-06-06: 3 mL via INTRADERMAL

## 2021-06-06 MED ORDER — SODIUM CHLORIDE 0.9% FLUSH: 3.0000 mL | INTRAVENOUS | Status: DC | PRN

## 2021-06-06 MED ORDER — VERAPAMIL HCL 2.5 MG/ML IV SOLN
INTRAVENOUS | Status: DC | PRN
  Administered 2021-06-06: 10 mL via INTRA_ARTERIAL

## 2021-06-06 MED ORDER — SODIUM CHLORIDE 0.9 % IV SOLN: 250.0000 mL | INTRAVENOUS | Status: DC | PRN

## 2021-06-06 MED ORDER — ACETAMINOPHEN 325 MG PO TABS: 650.0000 mg | ORAL_TABLET | ORAL | Status: DC | PRN

## 2021-06-06 MED ORDER — ASPIRIN 81 MG PO CHEW: 81.0000 mg | CHEWABLE_TABLET | ORAL | Status: DC

## 2021-06-06 MED ORDER — SODIUM CHLORIDE 0.9% FLUSH: 3.0000 mL | Freq: Two times a day (BID) | INTRAVENOUS | Status: DC

## 2021-06-06 MED ORDER — HEPARIN (PORCINE) IN NACL 1000-0.9 UT/500ML-% IV SOLN
INTRAVENOUS | Status: AC
  Filled 2021-06-06: qty 1000

## 2021-06-06 MED ORDER — HEPARIN SODIUM (PORCINE) 1000 UNIT/ML IJ SOLN
INTRAMUSCULAR | Status: AC
  Filled 2021-06-06: qty 10

## 2021-06-06 MED ORDER — MIDAZOLAM HCL 2 MG/2ML IJ SOLN
INTRAMUSCULAR | Status: AC
  Filled 2021-06-06: qty 2

## 2021-06-06 MED ORDER — FENTANYL CITRATE (PF) 100 MCG/2ML IJ SOLN
INTRAMUSCULAR | Status: AC
  Filled 2021-06-06: qty 2

## 2021-06-06 MED ORDER — CLOPIDOGREL BISULFATE 75 MG PO TABS: 75.0000 mg | ORAL_TABLET | ORAL | Status: DC

## 2021-06-06 MED ORDER — ONDANSETRON HCL 4 MG/2ML IJ SOLN: 4.0000 mg | Freq: Four times a day (QID) | INTRAMUSCULAR | Status: DC | PRN

## 2021-06-06 MED ORDER — MIDAZOLAM HCL 2 MG/2ML IJ SOLN
INTRAMUSCULAR | Status: DC | PRN
  Administered 2021-06-06: 1 mg via INTRAVENOUS

## 2021-06-06 MED ORDER — VERAPAMIL HCL 2.5 MG/ML IV SOLN
INTRAVENOUS | Status: AC
  Filled 2021-06-06: qty 2

## 2021-06-06 MED ORDER — SODIUM CHLORIDE 0.9 % WEIGHT BASED INFUSION
3.0000 mL/kg/h | INTRAVENOUS | Status: DC
  Administered 2021-06-06: 3 mL/kg/h via INTRAVENOUS

## 2021-06-06 MED ORDER — IODIXANOL 320 MG/ML IV SOLN
INTRAVENOUS | Status: DC | PRN
  Administered 2021-06-06: 85 mL via INTRA_ARTERIAL

## 2021-06-06 MED ORDER — LIDOCAINE HCL (PF) 1 % IJ SOLN
INTRAMUSCULAR | Status: AC
  Filled 2021-06-06: qty 30

## 2021-06-06 MED ORDER — HEPARIN (PORCINE) IN NACL 1000-0.9 UT/500ML-% IV SOLN
INTRAVENOUS | Status: DC | PRN
  Administered 2021-06-06 (×2): 500 mL

## 2021-06-06 MED ORDER — HEPARIN SODIUM (PORCINE) 1000 UNIT/ML IJ SOLN
INTRAMUSCULAR | Status: DC | PRN
  Administered 2021-06-06: 6000 [IU] via INTRAVENOUS

## 2021-06-06 SURGICAL SUPPLY — 14 items
CATH INFINITI 5F PIG 125CM (CATHETERS) ×1 IMPLANT
CATH LAUNCHER 6FR JR4 (CATHETERS) ×1 IMPLANT
GLIDESHEATH SLEND SS 6F .021 (SHEATH) ×1 IMPLANT
GUIDEWIRE INQWIRE 1.5J.035X260 (WIRE) IMPLANT
INQWIRE 1.5J .035X260CM (WIRE) ×3 IMPLANT
KIT ENCORE 26 ADVANTAGE (KITS) ×1 IMPLANT
KIT PV (KITS) ×3 IMPLANT
STENT HERCULINK RX 6.5X15X135 (Permanent Stent) ×1 IMPLANT
SYR MEDRAD MARK 7 150ML (SYRINGE) ×3 IMPLANT
TRANSDUCER W/STOPCOCK (MISCELLANEOUS) ×3 IMPLANT
TRAY PV CATH (CUSTOM PROCEDURE TRAY) ×3 IMPLANT
TUBING CIL FLEX 10 FLL-RA (TUBING) ×1 IMPLANT
WIRE HI TORQ VERSACORE J 260CM (WIRE) ×1 IMPLANT
WIRE RUNTHROUGH .014X300CM (WIRE) ×1 IMPLANT

## 2021-06-06 NOTE — Interval H&P Note (Signed)
History and Physical Interval Note: ? ?06/06/2021 ?8:26 AM ? ?Alexandra [name]  has presented today for surgery, with the diagnosis of renal artery stenosis.  The various methods of treatment have been discussed with the patient and family. After consideration of risks, benefits and other options for treatment, the patient has consented to  Procedure(s): ?RENAL ANGIOGRAPHY (N/A) as a surgical intervention.  The patient's history has been reviewed, patient examined, no change in status, stable for surgery.  I have reviewed the patient's chart and labs.  Questions were answered to the patient's satisfaction.   ? ? ?[name] ? ? ?

## 2021-06-07 ENCOUNTER — Encounter (HOSPITAL_COMMUNITY): Payer: Self-pay | Admitting: Cardiovascular Disease

## 2021-06-08 ENCOUNTER — Other Ambulatory Visit: Payer: Self-pay | Admitting: *Deleted

## 2021-06-08 DIAGNOSIS — I701 Atherosclerosis of renal artery: Secondary | ICD-10-CM

## 2021-06-25 ENCOUNTER — Ambulatory Visit (HOSPITAL_COMMUNITY)
Admission: RE | Admit: 2021-06-25 | Discharge: 2021-06-25 | Disposition: A | Discharge status: Home / Self Care | Payer: Medicare Other | Source: Ambulatory Visit | Attending: Internal Medicine | Admitting: Internal Medicine

## 2021-06-25 DIAGNOSIS — I701 Atherosclerosis of renal artery: Secondary | ICD-10-CM | POA: Diagnosis present

## 2021-06-26 ENCOUNTER — Ambulatory Visit (INDEPENDENT_AMBULATORY_CARE_PROVIDER_SITE_OTHER): Payer: Medicare Other | Admitting: Cardiovascular Disease

## 2021-06-26 ENCOUNTER — Encounter: Payer: Self-pay | Admitting: Cardiovascular Disease

## 2021-06-26 VITALS — BP 92/64 | HR 68 | Ht 65.0 in | Wt 158.0 lb

## 2021-06-26 DIAGNOSIS — I739 Peripheral vascular disease, unspecified: Secondary | ICD-10-CM | POA: Diagnosis not present

## 2021-06-26 DIAGNOSIS — Z720 Tobacco use: Secondary | ICD-10-CM

## 2021-06-26 DIAGNOSIS — I701 Atherosclerosis of renal artery: Secondary | ICD-10-CM

## 2021-06-26 DIAGNOSIS — I2510 Atherosclerotic heart disease of native coronary artery without angina pectoris: Secondary | ICD-10-CM | POA: Diagnosis not present

## 2021-06-26 DIAGNOSIS — E785 Hyperlipidemia, unspecified: Secondary | ICD-10-CM | POA: Diagnosis not present

## 2021-06-26 NOTE — Patient Instructions (Signed)
Medication Instructions:  ?STOP the Amlodipine ? ?*If you need a refill on your cardiac medications before your next appointment, please call your pharmacy* ? ? ?Lab Work: ?None ordered ?If you have labs (blood work) drawn today and your tests are completely normal, you will receive your results only by: ?MyChart Message (if you have MyChart) OR ?A paper copy in the mail ?If you have any lab test that is abnormal or we need to change your treatment, we will call you to review the results. ? ? ?Testing/Procedures: ?None ordered ? ? ?Follow-Up: ?At [facility], you and your health needs are our priority.  As part of our continuing mission to provide you with exceptional heart care, we have created designated Provider Care Teams.  These Care Teams include your primary Cardiologist (physician) and Advanced Practice Providers (APPs -  Physician Assistants and Nurse Practitioners) who all work together to provide you with the care you need, when you need it. ? ?We recommend signing up for the patient portal called "MyChart".  Sign up information is provided on this After Visit Summary.  MyChart is used to connect with patients for Virtual Visits (Telemedicine).  Patients are able to view lab/test results, encounter notes, upcoming appointments, etc.  Non-urgent messages can be sent to your provider as well.   ?To learn more about what you can do with MyChart, go to [email or web address].   ? ?Your next appointment:   ?3 month(s) ? ?The format for your next appointment:   ?In Person ? ?Provider:   ?[name] ? ?Other Instructions ?[name] would like you to check your blood pressure daily for the next week.  Keep a journal of these daily blood pressure and heart rate readings and call our office or send a message through [place] with the results. Thank you! ? ?It is best to check your BP 1-2 hours after taking your medications to see the medications effectiveness on your BP.  ?  ?Here are some tips that our clinical  pharmacists share for home BP monitoring: ??         Rest 10 minutes before taking your blood pressure. ??         Don't smoke or drink caffeinated beverages for at least 30 minutes before. ??         Take your blood pressure before (not after) you eat. ??         Sit comfortably with your back supported and both feet on the floor (don't cross your legs). ??         Elevate your arm to heart level on a table or a desk. ??         Use the proper sized cuff. It should fit smoothly and snugly around your bare upper arm. There should be enough room to slip a fingertip under the cuff. The bottom edge of the cuff should be 1 inch above the crease of the elbow. ? ? ?

## 2021-06-26 NOTE — Progress Notes (Signed)
?  ?Cardiology Office Note ? ? ?Date:  06/26/2021  ? ?ID:  Alexandra [name], DOB January 29, 1951, MRN [id number] ? ?PCP:  [facility]  ?Cardiologist: Dr. Ellyn Hack ? ?Chief Complaint  ?Patient presents with  ? Follow-up  ? Edema  ?  Ankles. ?  ? ? ?  ?History of Present Illness: ?Alexandra [name] is a 71 y.o. female who is here today for follow-up visit regarding peripheral arterial disease and renal artery stenosis.   ? ?She has known history of coronary artery disease, aortic valve disease status post TAVR, COPD, tobacco use, chronic diastolic heart failure, resistant hypertension and pulmonary hypertension. ? ?She had previous injuries and surgeries to both legs with chronic pain.  She was seen last year for severe bilateral calf claudication worse on the right side.  She was found to have bilateral SFA occlusion with severe disease affecting the right common iliac artery which was treated successfully with self-expanding stent placement.  During angiography, she was noted to have severe bilateral renal artery stenosis with atrophied left kidney.   ? ?Due to uncontrolled hypertension and worsening renal function, I proceeded with renal angiography last month which showed 90% ostial stenosis of the right renal artery.  I performed successful stent placement.  It was done via the left radial artery.  Since then, her blood pressure decreased significantly.  She is feeling well with no chest pain or worsening dyspnea.  She does complain of feeling dizzy and lightheaded.  Her blood pressure today is 92/64. ? ?Past Medical History:  ?Diagnosis Date  ? Arthritis   ? COPD (chronic obstructive pulmonary disease) ([place]) 05/22/2016  ? Severely decreased DLCO by PFTs in April 2019 but only mild obstructive disease.   ? Coronary artery disease, non-occlusive 11/28/2016  ? R&LHC: 65% distal LAD lesion -- likely not angiographically significant->medical management. Normal LVEF. Moderately elevated LVEDP. I  aortic valve gradient in the Cath Lab, moderate aortic stenosis. This would suggest that the stenosis is on the moderate side of moderate to severe.  Severe pulmonary hypertension (likely mixed).  AVA 0.96 cm?; P-P gradient ~ 30 mmHg, Mean ~24.5-27.5 (Moderate AS)   ? GERD (gastroesophageal reflux disease)   ? Heart murmur   ? Hx of adenomatous colonic polyps 10/19/2015  ? Hyperlipidemia   ? Hypertension   ? Moderate mitral regurgitation by prior echocardiography 10/2016  ? Echocardiogram revealed moderate mitral regurgitation; most recent Echo 02/12/2019- MAC with moderate MR  ? Multiple renal cysts 06/2020  ? Renal A Dopplers: Exophytic Cyst noted in Upper pole of Right Kidney (1.6x1.7x1.7 cm); Exophytic cysts: lower pole of Left Kidney 1.9 x 1.7 x 1.9 cm, and 1.4 x 1.2 x 1.5 cm.  ? PAOD (peripheral arterial occlusive disease) ([place]) 06/2020  ? Abd AoGram-BLE Runoff: Severe Bilat RA stenosis - small. atretic L RA. Severe stenosis of R Com Iliac A - Ext Iliac A (PTA-Stent). Bilateral SFA flush CTO. Mod R CFA, L Iliac & CFA. Bilateral SFA reconstitution distal Pop A. R PTA flush CTO 2 V runoff. L LE 3 V runoff unitl distal LPTA CTO.   Small Infrarenal aneurysmal areas with extensive atheroma - no obstructive disease  ? Pulmonary hypertension associated Aortic Stenosis, Mitral Regurgitation & COPD   ? Peak PAP by Echo ~54 mmHg;  by Cath PAP/Mean 71/32 mmHg, 53 mmHg (with PCWP & LVEDP ~24-26 mmHg); TPG ~26 mmHg  ? Pulmonary nodules   ? Renal artery stenosis, native, bilateral ([place]) 06/2020  ?  Abd Ao Gram: LRA ~ 99% ostial stenosis with small atretic L Kidney (f/u dopplers - Abnormal L kidney size & Resistive Index); R RA ostial 80-90% stenosis (confirmed by doppler Resistive Index, but normal R kidney size.  ? S/P TAVR (transcatheter aortic valve replacement) 12/30/2017  ? Edward's Sapien 3 bioprosthetic THB via TF approach  ? Severe aortic stenosis 2018  ? Underwent TAVR in 2019  ? Superior mesenteric artery stenosis  ([place]) 06/2020  ? Abdominal Ao Dopplers - 70-90% Ostial SMA stenosis with normal Celiac A.  ? ? ?Past Surgical History:  ?Procedure Laterality Date  ? ABDOMINAL AORTOGRAM W/LOWER EXTREMITY N/A 07/12/2020  ? Procedure: ABDOMINAL AORTOGRAM W/LOWER EXTREMITY;  Surgeon: Wellington Hampshire, MD;  [facility];; Severe B-RA stenosis - sm/atretic L RA. Severe Dz R Com-Ext Iliac A (PTA-Stent). B-SFA flush CTO. Mod R CFA, L Iliac-CFA. B-SFA recon @ dPop A. R PTA flush CTO & 2 V runoff. L LE 3 V runoff w/ distal LPTA CTO.   Sm InfraRenal aneurysms w/ extensive atheroma - no obstructive Dz  ? APPENDECTOMY    ? BREAST CYST EXCISION Right 1970  ? CARDIAC VALVE REPLACEMENT    ? CATARACT EXTRACTION W/ INTRAOCULAR LENS IMPLANT Right   ? CHOLECYSTECTOMY OPEN    ? COLONOSCOPY  2003, 2017  ? CTA Chest  09/2016  ? Thoracic Aortic Ca2+ w/o dilation.  Coronary Calcification noted. PA normal. Mild Emphysematous changes w/ mild LLL scarring.    ? FRACTURE SURGERY    ? 2007 -right tib /fib fracture ----07/2009 right femur fx after a fall  ? FRACTURE SURGERY Right 2011  ? Femur  ? PERIPHERAL VASCULAR CATHETERIZATION N/A 08/09/2014  ? Procedure: Aortic Arch Angiography;  Surgeon: Serafina Mitchell, MD;  Location: [place] [facility];  Service: Cardiovascular: Type 1 Arch. No significant stenosis, aneurysmal degeneration or dissection.  No luminal irregularity seen in R or L SubClavian, Brachial or Radial A. Chronic distal ulnar A occlusion Bilatera.  ? PERIPHERAL VASCULAR CATHETERIZATION Right 08/09/2014  ? Procedure: Upper Extremity Angiography;  Surgeon: Serafina Mitchell, MD;  Location: [place] [facility];  Service: Cardiovascular;  Laterality: Right;  ? PERIPHERAL VASCULAR INTERVENTION  07/12/2020  ? Procedure: PERIPHERAL VASCULAR INTERVENTION;  Surgeon: Wellington Hampshire, MD;  Location: [place] [facility];  Service: Cardiovascular;;  R Common-External Iliac A PTA & Stent  ? RENAL ANGIOGRAPHY N/A 06/06/2021  ? Procedure: RENAL  ANGIOGRAPHY;  Surgeon: Wellington Hampshire, MD;  Location: [place] [facility];  Service: Cardiovascular;  Laterality: N/A;  ? RENAL INTERVENTION  06/06/2021  ? Procedure: RENAL INTERVENTION;  Surgeon: Wellington Hampshire, MD;  Location: [place] [facility];  Service: Cardiovascular;;  ? RIGHT/LEFT HEART CATH AND CORONARY ANGIOGRAPHY N/A 12/06/2016  ? Procedure: RIGHT/LEFT HEART CATH AND CORONARY ANGIOGRAPHY;  Surgeon: Leonie Man, MD;  [facility]: Cor Angio: 65% dLAD (Med Rx). AVA 0.96 cm?; P-P gradient ~ 30 mmHg, Mean ~24.5-27.5 (Mod AS); RHC #s: RAP 8 mmHg, RVP/EDP: 71/8/15 mmHg, PCWP: 21-24 mmHg, PAP/mean: 71/32/52 mmHg = Severe Mixed Pulmonary HTN; LVP/EDP 205/17/26 mmHg ; CO/CI by Fick: 4.35, 2.44 - mildly reduced  ? RIGHT/LEFT HEART CATH AND CORONARY ANGIOGRAPHY N/A 11/20/2017  ? Procedure: RIGHT/LEFT HEART CATH AND CORONARY ANGIOGRAPHY;  Surgeon: Larey Dresser, MD;  Location: [place] [facility];  Service: Cardiovascular;  Laterality: N/A;  ? TEE WITHOUT CARDIOVERSION N/A 12/30/2017  ? Procedure: (Intra-Op) TRANSESOPHAGEAL ECHOCARDIOGRAM (TEE);  Surgeon: Burnell Blanks,  MD;  Location: [facility];  Service: Open Heart Surgery; Post TAVR good position of 23 mm AP and 3 valve.  No new or WMA.  EF 55-60%.  No effusion.  Mild perivalvular regurgitation seen.  Mean gradient 7 mmHg.  Peak 16 mmHg.  ? TOTAL KNEE ARTHROPLASTY Left 12/01/2018  ? Procedure: Left Knee Arthroplasty;  Surgeon: Melrose Nakayama, MD;  Location: [facility];  Service: Orthopedics;  Laterality: Left;  ? TRANSCATHETER AORTIC VALVE REPLACEMENT, TRANSFEMORAL N/A 12/30/2017  ? Procedure: TRANSCATHETER AORTIC VALVE REPLACEMENT, TRANSFEMORAL;  Surgeon: Burnell Blanks, MD;  Location: [facility];  Service: Open Heart Surgery;  Laterality: N/A; 23 mm Edwards SAPIEN 3 THV  ? TRANSTHORACIC ECHOCARDIOGRAM  10/30/2016  ? Mild Concentric LVH. EF 55-60%. No RWMA, Gr 2 DD. Mod-Severe AS (mean-peak Gradient 31 mmHG - 64 mmHg), Mod MR. Mod  LA dilation, Mod PA HTN - peak pressure ~54 mmHg). = Progression of AS from 2014  ? TRANSTHORACIC ECHOCARDIOGRAM  01/2019  ? EF 55 to 60%.  Elevated LVEDP.  GR 1 DD.  No R WMA.  Moderate LA dilation.  Well-se

## 2021-07-04 ENCOUNTER — Telehealth: Payer: Self-pay | Admitting: Cardiovascular Disease

## 2021-07-04 NOTE — Telephone Encounter (Signed)
Returned the call to the patient. She was calling to give her blood pressure readings. She stated that these were after her medications were taken. She will continue to monitor her blood pressure and call back in one week. She stated that she still lacks energy but denies dizziness or light headedness. She has discontinued the Amlodipine.  ? ?4/5 99/64 ?4/6 124/69 ?4/7 115/68 ?4/8 139/75 ?4/9 126/64 ?4/10 119/77 ?4/11 126/74 ? ?

## 2021-07-04 NOTE — Telephone Encounter (Signed)
? ?  Pt is requesting to speak with Alexandra [name]. She said she was told to call ti give updates about her BP. Shew wants to provide her BP readings to [place] ?

## 2021-07-05 MED ORDER — BISOPROLOL-HYDROCHLOROTHIAZIDE 10-6.25 MG PO TABS: 1.0000 | ORAL_TABLET | Freq: Every day | ORAL | 1 refills | Status: DC

## 2021-07-05 NOTE — Telephone Encounter (Signed)
Decrease Bisoprolol-HCTZ to one tablet daily.  ?

## 2021-07-05 NOTE — Telephone Encounter (Signed)
Patient made aware. Prescription changed. ?

## 2021-08-28 ENCOUNTER — Other Ambulatory Visit: Payer: Self-pay | Admitting: Cardiology

## 2021-10-01 NOTE — Progress Notes (Signed)
Cardiology Office Note   Date:  10/02/2021   ID:  Alexandra [name], DOB 1950-10-11, MRN [id number]  PCP:  [facility]  Cardiologist: Dr. Herbie Baltimore  No chief complaint on file.     History of Present Illness: Alexandra [name] is a 71 y.o. female who is here today for follow-up visit regarding peripheral arterial disease and renal artery stenosis.    She has known history of coronary artery disease, aortic valve disease status post TAVR, COPD, tobacco use, chronic diastolic heart failure, resistant hypertension and pulmonary hypertension.  She had previous injuries and surgeries to both legs with chronic pain.   She is followed for peripheral arterial disease with known bilateral SFA occlusion and significant disease in the right common iliac artery status post stent placement.  In addition, she is known to have severe bilateral renal artery stenosis with occluded right renal artery and severe stenosis in the right renal artery status post stenting in March 2023. She has been doing well with no chest pain or worsening dyspnea.  She reports stable moderate bilateral calf claudication.  Her blood pressure was controlled but she ran out of losartan 1 week ago.   Past Medical History:  Diagnosis Date   Arthritis    COPD (chronic obstructive pulmonary disease) ([facility]) 05/22/2016   Severely decreased DLCO by PFTs in April 2019 but only mild obstructive disease.    Coronary artery disease, non-occlusive 11/28/2016   R&LHC: 65% distal LAD lesion -- likely not angiographically significant->medical management. Normal LVEF. Moderately elevated LVEDP. I aortic valve gradient in the Cath Lab, moderate aortic stenosis. This would suggest that the stenosis is on the moderate side of moderate to severe.  Severe pulmonary hypertension (likely mixed).  AVA 0.96 cm; P-P gradient ~ 30 mmHg, Mean ~24.5-27.5 (Moderate AS)    GERD (gastroesophageal reflux disease)    Heart murmur     Hx of adenomatous colonic polyps 10/19/2015   Hyperlipidemia    Hypertension    Moderate mitral regurgitation by prior echocardiography 10/2016   Echocardiogram revealed moderate mitral regurgitation; most recent Echo 02/12/2019- MAC with moderate MR   Multiple renal cysts 06/2020   Renal A Dopplers: Exophytic Cyst noted in Upper pole of Right Kidney (1.6x1.7x1.7 cm); Exophytic cysts: lower pole of Left Kidney 1.9 x 1.7 x 1.9 cm, and 1.4 x 1.2 x 1.5 cm.   PAOD (peripheral arterial occlusive disease) ([facility]) 06/2020   Abd AoGram-BLE Runoff: Severe Bilat RA stenosis - small. atretic L RA. Severe stenosis of R Com Iliac A - Ext Iliac A (PTA-Stent). Bilateral SFA flush CTO. Mod R CFA, L Iliac & CFA. Bilateral SFA reconstitution distal Pop A. R PTA flush CTO 2 V runoff. L LE 3 V runoff unitl distal LPTA CTO.   Small Infrarenal aneurysmal areas with extensive atheroma - no obstructive disease   Pulmonary hypertension associated Aortic Stenosis, Mitral Regurgitation & COPD    Peak PAP by Echo ~54 mmHg;  by Cath PAP/Mean 71/32 mmHg, 53 mmHg (with PCWP & LVEDP ~24-26 mmHg); TPG ~26 mmHg   Pulmonary nodules    Renal artery stenosis, native, bilateral ([facility]) 06/2020   Abd Ao Gram: LRA ~ 99% ostial stenosis with small atretic L Kidney (f/u dopplers - Abnormal L kidney size & Resistive Index); R RA ostial 80-90% stenosis (confirmed by doppler Resistive Index, but normal R kidney size.   S/P TAVR (transcatheter aortic valve replacement) 12/30/2017   Edward's Sapien 3 bioprosthetic THB via TF  approach   Severe aortic stenosis 2018   Underwent TAVR in 2019   Superior mesenteric artery stenosis ([facility]) 06/2020   Abdominal Ao Dopplers - 70-90% Ostial SMA stenosis with normal Celiac A.    Past Surgical History:  Procedure Laterality Date   ABDOMINAL AORTOGRAM W/LOWER EXTREMITY N/A 07/12/2020   Procedure: ABDOMINAL AORTOGRAM W/LOWER EXTREMITY;  Surgeon: Iran Ouch, MD;  [facility];;  Severe B-RA stenosis - sm/atretic L RA. Severe Dz R Com-Ext Iliac A (PTA-Stent). B-SFA flush CTO. Mod R CFA, L Iliac-CFA. B-SFA recon @ dPop A. R PTA flush CTO & 2 V runoff. L LE 3 V runoff w/ distal LPTA CTO.   Sm InfraRenal aneurysms w/ extensive atheroma - no obstructive Dz   APPENDECTOMY     BREAST CYST EXCISION Right 1970   CARDIAC VALVE REPLACEMENT     CATARACT EXTRACTION W/ INTRAOCULAR LENS IMPLANT Right    CHOLECYSTECTOMY OPEN     COLONOSCOPY  2003, 2017   CTA Chest  09/2016   Thoracic Aortic Ca2+ w/o dilation.  Coronary Calcification noted. PA normal. Mild Emphysematous changes w/ mild LLL scarring.     FRACTURE SURGERY     2007 -right tib /fib fracture ----07/2009 right femur fx after a fall   FRACTURE SURGERY Right 2011   Femur   PERIPHERAL VASCULAR CATHETERIZATION N/A 08/09/2014   Procedure: Aortic Arch Angiography;  Surgeon: Nada Libman, MD;  Location: [facility] INVASIVE [facility] LAB;  Service: Cardiovascular: Type 1 Arch. No significant stenosis, aneurysmal degeneration or dissection.  No luminal irregularity seen in R or L SubClavian, Brachial or Radial A. Chronic distal ulnar A occlusion Bilatera.   PERIPHERAL VASCULAR CATHETERIZATION Right 08/09/2014   Procedure: Upper Extremity Angiography;  Surgeon: Nada Libman, MD;  Location: [facility] INVASIVE [facility] LAB;  Service: Cardiovascular;  Laterality: Right;   PERIPHERAL VASCULAR INTERVENTION  07/12/2020   Procedure: PERIPHERAL VASCULAR INTERVENTION;  Surgeon: Iran Ouch, MD;  [facility];  Service: Cardiovascular;;  R Common-External Iliac A PTA & Stent   RENAL ANGIOGRAPHY N/A 06/06/2021   Procedure: RENAL ANGIOGRAPHY;  Surgeon: Iran Ouch, MD;  [facility];  Service: Cardiovascular;  Laterality: N/A;   RENAL INTERVENTION  06/06/2021   Procedure: RENAL INTERVENTION;  Surgeon: Iran Ouch, MD;  [facility];  Service: Cardiovascular;;   RIGHT/LEFT HEART CATH AND CORONARY  ANGIOGRAPHY N/A 12/06/2016   Procedure: RIGHT/LEFT HEART CATH AND CORONARY ANGIOGRAPHY;  Surgeon: Marykay Lex, MD;  [facility]: Cor Angio: 65% dLAD (Med Rx). AVA 0.96 cm; P-P gradient ~ 30 mmHg, Mean ~24.5-27.5 (Mod AS); RHC #s: RAP 8 mmHg, RVP/EDP: 71/8/15 mmHg, PCWP: 21-24 mmHg, PAP/mean: 71/32/52 mmHg = Severe Mixed Pulmonary HTN; LVP/EDP 205/17/26 mmHg ; CO/CI by Fick: 4.35, 2.44 - mildly reduced   RIGHT/LEFT HEART CATH AND CORONARY ANGIOGRAPHY N/A 11/20/2017   Procedure: RIGHT/LEFT HEART CATH AND CORONARY ANGIOGRAPHY;  Surgeon: Laurey Morale, MD;  Location: [facility] INVASIVE [facility] LAB;  Service: Cardiovascular;  Laterality: N/A;   TEE WITHOUT CARDIOVERSION N/A 12/30/2017   Procedure: (Intra-Op) TRANSESOPHAGEAL ECHOCARDIOGRAM (TEE);  Surgeon: Kathleene Hazel, MD;  Location: [facility] OR;  Service: Open Heart Surgery; Post TAVR good position of 23 mm AP and 3 valve.  No new or WMA.  EF 55-60%.  No effusion.  Mild perivalvular regurgitation seen.  Mean gradient 7 mmHg.  Peak 16 mmHg.   TOTAL KNEE ARTHROPLASTY Left 12/01/2018   Procedure:  Left Knee Arthroplasty;  Surgeon: Marcene Corning, MD;  Location: [facility];  Service: Orthopedics;  Laterality: Left;   TRANSCATHETER AORTIC VALVE REPLACEMENT, TRANSFEMORAL N/A 12/30/2017   Procedure: TRANSCATHETER AORTIC VALVE REPLACEMENT, TRANSFEMORAL;  Surgeon: Kathleene Hazel, MD;  [facility];  Service: Open Heart Surgery;  Laterality: N/A; 23 mm Edwards SAPIEN 3 THV   TRANSTHORACIC ECHOCARDIOGRAM  10/30/2016   Mild Concentric LVH. EF 55-60%. No RWMA, Gr 2 DD. Mod-Severe AS (mean-peak Gradient 31 mmHG - 64 mmHg), Mod MR. Mod LA dilation, Mod PA HTN - peak pressure ~54 mmHg). = Progression of AS from 2014   TRANSTHORACIC ECHOCARDIOGRAM  01/2019   EF 55 to 60%.  Elevated LVEDP.  GR 1 DD.  No R WMA.  Moderate LA dilation.  Well-seated stented bioprosthetic aortic valve.  Mean gradient 13 mercury.  Moderate MAC moderate MR.  No evidence  of mildly elevated RAP/CVP and moderately elevated PA P   TRANSTHORACIC ECHOCARDIOGRAM  10/2017   a) Pre-TAVR 10/31/17: EF 55 to 60%.  GR 1 DD.  Calcific severe AS.  Peak Grad 62 mmHg, mean 32 mmHg.  Progression of Dz.  AVA ~0.8 cm.; b) Post TAVR 10/9/'19: EF 60-65%.  GR 1 DD.  Stable appearing 23 mm Edwards's Sapien AV bioprosthesis.  No perivalvular AI.  Mean Grad 22 mmHg; c) 11/13/'19: AoV mean Grad 19 mmHg,, Peak 36 mmHg. PAP ~33 mmHg   TRANSTHORACIC ECHOCARDIOGRAM  08/02/2020   EF 60 to 65%.  GR 1 DD.  Elevated LAP/LVEDP.  Mildly elevated PAP.  Mild LA dilation.  23 mm SAPIEN 3 prosthetic TAVR valve present.  Mild perivalvular leak.  Mean gradient 16 mmHg. Mod MR.   ULTRASOUND GUIDANCE FOR VASCULAR ACCESS  08/09/2014   Procedure: Ultrasound Guidance For Vascular Access;  Surgeon: Nada Libman, MD;  [facility] INVASIVE CV LAB;  Service: Cardiovascular;;     Current Outpatient Medications  Medication Sig Dispense Refill   albuterol (VENTOLIN HFA) 108 (90 Base) MCG/ACT inhaler Inhale 2 puffs into the lungs every 6 (six) hours as needed for shortness of breath.     aspirin 81 MG tablet Take 1 tablet (81 mg total) by mouth 2 (two) times daily at 10 AM and 5 PM. (Patient taking differently: Take 81 mg by mouth 3 (three) times a week.) 30 tablet 0   bisoprolol-hydrochlorothiazide (ZIAC) 10-6.25 MG tablet Take 1 tablet by mouth daily. 90 tablet 1   clopidogrel (PLAVIX) 75 MG tablet TAKE 1 TABLET(75 MG) BY MOUTH DAILY 30 tablet 6   cyclobenzaprine (FLEXERIL) 10 MG tablet Take 10 mg by mouth at bedtime.     esomeprazole (NEXIUM) 40 MG capsule TAKE 1 CAPSULE BY MOUTH DAILY AT NOON 90 capsule 2   furosemide (LASIX) 20 MG tablet TAKE 1 TABLET BY MOUTH DAILY AS NEEDED FOR INCREASE SWELLING 30 tablet 6   guaiFENesin (MUCINEX) 600 MG 12 hr tablet Take 1 tablet (600 mg total) by mouth 2 (two) times daily. 60 tablet 11   losartan (COZAAR) 100 MG tablet Take 100 mg by mouth daily.      oxyCODONE-acetaminophen (PERCOCET) 10-325 MG tablet Take 1 tablet by mouth 5 (five) times daily.     potassium chloride SA (KLOR-CON) 20 MEQ tablet TAKE 1 TABLET(20 MEQ) BY MOUTH DAILY 90 tablet 3   rosuvastatin (CRESTOR) 20 MG tablet TAKE 1 TABLET(20 MG) BY MOUTH DAILY 90 tablet 3   tiZANidine (ZANAFLEX) 4 MG tablet 1 tablet as needed Orally once at night for 30 days  No current facility-administered medications for this visit.    Allergies:   Codeine and Symproic [naldemedine]    Social History:  The patient  reports that she has been smoking cigarettes. She has a 4.00 pack-year smoking history. She has never used smokeless tobacco. She reports that she does not currently use alcohol. She reports that she does not use drugs.   Family History:  The patient's family history includes Heart attack in her brother; Heart disease in her father and mother; Hypertension in her mother.    ROS:  Please see the history of present illness.   Otherwise, review of systems are positive for none.   All other systems are reviewed and negative.    PHYSICAL EXAM: VS:  BP (!) 143/90   Pulse 83   Ht 5\' 5"  (1.651 m)   Wt 158 lb 12.8 oz (72 kg)   SpO2 96%   BMI 26.43 kg/m  , BMI Body mass index is 26.43 kg/m. GEN: Well nourished, well developed, in no acute distress  HEENT: normal  Neck: no JVD, bilateral carotid bruits, or masses Cardiac: RRR; no  rubs, or gallops, 2 /6 systolic murmur in the aortic area.  Mild bilateral leg edema Respiratory:  clear to auscultation bilaterally, normal work of breathing GI: soft, nontender, nondistended, + BS MS: no deformity or atrophy  Skin: warm and dry, no rash Neuro:  Strength and sensation are intact Psych: euthymic mood, full affect Vascular: Radial pulses normal bilaterally.  No left radial hematoma.   EKG:  EKG is not ordered today.    Recent Labs: 05/31/2021: BUN 19; Creatinine, Ser 1.21; Hemoglobin 12.2; Platelets 238; Potassium 4.7; Sodium 137     Lipid Panel    Component Value Date/Time   CHOL 145 08/08/2020 1058   TRIG 170 (H) 08/08/2020 1058   HDL 41 08/08/2020 1058   CHOLHDL 3.5 08/08/2020 1058   CHOLHDL 3.2 03/05/2018 1135   VLDL 22 03/05/2018 1135   LDLCALC 75 08/08/2020 1058      Wt Readings from Last 3 Encounters:  10/02/21 158 lb 12.8 oz (72 kg)  06/26/21 158 lb (71.7 kg)  06/06/21 155 lb (70.3 kg)          11/28/2016   10:08 AM  PAD Screen  Previous PAD dx? No  Previous surgical procedure? No  Pain with walking? Yes  Subsides with rest? Yes  Feet/toe relief with dangling? No  Painful, non-healing ulcers? No  Extremities discolored? No      ASSESSMENT AND PLAN:  1.  Peripheral arterial disease :  Status post self-expanding stent placement to the right iliac artery.  She does have chronically occluded SFAs with collaterals and this will be treated medically for now.  Continue dual antiplatelet therapy.  She has stable moderate bilateral calf claudication.  Repeat Doppler studies in November.  2.  Bilateral renal artery stenosis: The left kidney appears to be nonviable given the size.  She is status post recent right renal artery stenting for severe ostial stenosis.  Check renal function today.  Blood pressure has been elevated but she ran out of losartan 1 week ago and this was refilled today.  Repeat renal artery duplex in November.  3.   Coronary artery disease involving native coronary artery without angina: She seems to be doing well from a cardiac standpoint.  Continue medical therapy.  4.  Status post TAVR: Stable.  5.  Hyperlipidemia: Continue treatment with rosuvastatin.  I requested a follow-up lipid and liver  profile.  6.  Tobacco use: She cut down to 2 cigarettes a day.  I again discussed with her the importance of smoking cessation.    Disposition:   FU with me in 6 months  Signed,  Lorine Bears, MD  10/02/2021 9:51 AM    [facility]

## 2021-10-02 ENCOUNTER — Ambulatory Visit: Payer: Medicare Other | Admitting: Cardiovascular Disease

## 2021-10-02 ENCOUNTER — Encounter: Payer: Self-pay | Admitting: Cardiovascular Disease

## 2021-10-02 VITALS — BP 143/90 | HR 83 | Ht 65.0 in | Wt 158.8 lb

## 2021-10-02 DIAGNOSIS — Z720 Tobacco use: Secondary | ICD-10-CM

## 2021-10-02 DIAGNOSIS — I2510 Atherosclerotic heart disease of native coronary artery without angina pectoris: Secondary | ICD-10-CM

## 2021-10-02 DIAGNOSIS — E785 Hyperlipidemia, unspecified: Secondary | ICD-10-CM

## 2021-10-02 DIAGNOSIS — I739 Peripheral vascular disease, unspecified: Secondary | ICD-10-CM | POA: Diagnosis not present

## 2021-10-02 DIAGNOSIS — I701 Atherosclerosis of renal artery: Secondary | ICD-10-CM

## 2021-10-02 MED ORDER — LOSARTAN POTASSIUM 100 MG PO TABS
100.0000 mg | ORAL_TABLET | Freq: Every day | ORAL | 3 refills | Status: DC
Start: 2021-10-02 — End: 2022-10-15

## 2021-10-02 NOTE — Patient Instructions (Signed)
Medication Instructions:  No changes *If you need a refill on your cardiac medications before your next appointment, please call your pharmacy*   Lab Work: Your provider would like for you to have the following labs today: CMP, Lipid and TSH  If you have labs (blood work) drawn today and your tests are completely normal, you will receive your results only by: [place] (if you have MyChart) OR A paper copy in the mail If you have any lab test that is abnormal or we need to change your treatment, we will call you to review the results.   Testing/Procedures: Your physician has requested that you have a renal artery duplex in November. During this test, an ultrasound is used to evaluate blood flow to the kidneys. Take your medications as you usually do. This will take place at [address], [address].  No food after 11PM the night before.  Water is OK. (Don't drink liquids if you have been instructed not to for ANOTHER test). Avoid foods that produce bowel gas, for 24 hours prior to exam (see below). No breakfast, no chewing gum, no smoking or carbonated beverages. Patient may take morning medications with water. Come in for test at least 15 minutes early to register.  Your physician has requested that you have an Aorta/Iliac Duplex in November. This will be take place at [address], [address].   No food after 11PM the night before.  Water is OK. (Don't drink liquids if you have been instructed not to for ANOTHER test) Avoid foods that produce bowel gas, for 24 hours prior to exam (see below). No breakfast, no chewing gum, no smoking or carbonated beverages. Patient may take morning medications with water. Come in for test at least 15 minutes early to register.  Your physician has requested that you have an ankle brachial index (ABI) in November. During this test an ultrasound and blood pressure cuff are used to evaluate the arteries that supply the arms and legs with  blood. Allow thirty minutes for this exam. There are no restrictions or special instructions. This will take place at [place], [address].    Follow-Up: At [facility], you and your health needs are our priority.  As part of our continuing mission to provide you with exceptional heart care, we have created designated Provider Care Teams.  These Care Teams include your primary Cardiologist (physician) and Advanced Practice Providers (APPs -  Physician Assistants and Nurse Practitioners) who all work together to provide you with the care you need, when you need it.  We recommend signing up for the patient portal called "MyChart".  Sign up information is provided on this After Visit Summary.  MyChart is used to connect with patients for Virtual Visits (Telemedicine).  Patients are able to view lab/test results, encounter notes, upcoming appointments, etc.  Non-urgent messages can be sent to your provider as well.   To learn more about what you can do with MyChart, go to [email or web address].    Your next appointment:   6 month(s)  The format for your next appointment:   In Person  Provider:   [name]

## 2021-10-03 LAB — COMPREHENSIVE METABOLIC PANEL
ALT: 9 IU/L (ref 0–32)
AST: 12 IU/L (ref 0–40)
Albumin/Globulin Ratio: 1.7 (ref 1.2–2.2)
Albumin: 4.3 g/dL (ref 3.8–4.8)
Alkaline Phosphatase: 139 IU/L — ABNORMAL HIGH (ref 44–121)
BUN/Creatinine Ratio: 11 — ABNORMAL LOW (ref 12–28)
BUN: 11 mg/dL (ref 8–27)
Bilirubin Total: 0.3 mg/dL (ref 0.0–1.2)
CO2: 23 mmol/L (ref 20–29)
Calcium: 9.1 mg/dL (ref 8.7–10.3)
Chloride: 101 mmol/L (ref 96–106)
Creatinine, Ser: 0.96 mg/dL (ref 0.57–1.00)
Globulin, Total: 2.6 g/dL (ref 1.5–4.5)
Glucose: 103 mg/dL — ABNORMAL HIGH (ref 70–99)
Potassium: 4.8 mmol/L (ref 3.5–5.2)
Sodium: 138 mmol/L (ref 134–144)
Total Protein: 6.9 g/dL (ref 6.0–8.5)
eGFR: 63 mL/min/{1.73_m2} (ref >59)

## 2021-10-03 LAB — LIPID PANEL
Chol/HDL Ratio: 3.3 ratio (ref 0.0–4.4)
Cholesterol, Total: 125 mg/dL (ref 100–199)
HDL: 38 mg/dL — ABNORMAL LOW (ref >39)
LDL Chol Calc (NIH): 63 mg/dL (ref 0–99)
Triglycerides: 138 mg/dL (ref 0–149)
VLDL Cholesterol Cal: 24 mg/dL (ref 5–40)

## 2021-10-03 LAB — TSH: TSH: 2.14 u[IU]/mL (ref 0.450–4.500)

## 2021-10-05 ENCOUNTER — Telehealth: Payer: Self-pay | Admitting: Cardiovascular Disease

## 2021-10-05 NOTE — Telephone Encounter (Signed)
Patient returned call for lab results.  

## 2021-10-05 NOTE — Telephone Encounter (Signed)
Called patient. Patient made aware of her lab results. Verbalized understanding. No questions or concerns expressed at this time.

## 2021-10-09 ENCOUNTER — Encounter: Payer: Self-pay | Admitting: *Deleted

## 2021-10-22 ENCOUNTER — Encounter: Payer: Self-pay | Admitting: Cardiology

## 2021-10-22 ENCOUNTER — Ambulatory Visit: Payer: Medicare Other | Admitting: Cardiology

## 2021-10-22 VITALS — BP 142/80 | HR 78 | Ht 65.0 in | Wt 157.8 lb

## 2021-10-22 DIAGNOSIS — E785 Hyperlipidemia, unspecified: Secondary | ICD-10-CM

## 2021-10-22 DIAGNOSIS — I340 Nonrheumatic mitral (valve) insufficiency: Secondary | ICD-10-CM | POA: Diagnosis not present

## 2021-10-22 DIAGNOSIS — I10 Essential (primary) hypertension: Secondary | ICD-10-CM

## 2021-10-22 DIAGNOSIS — Z952 Presence of prosthetic heart valve: Secondary | ICD-10-CM

## 2021-10-22 DIAGNOSIS — I5032 Chronic diastolic (congestive) heart failure: Secondary | ICD-10-CM | POA: Diagnosis not present

## 2021-10-22 DIAGNOSIS — I350 Nonrheumatic aortic (valve) stenosis: Secondary | ICD-10-CM

## 2021-10-22 DIAGNOSIS — J449 Chronic obstructive pulmonary disease, unspecified: Secondary | ICD-10-CM

## 2021-10-22 DIAGNOSIS — I2510 Atherosclerotic heart disease of native coronary artery without angina pectoris: Secondary | ICD-10-CM | POA: Diagnosis not present

## 2021-10-22 DIAGNOSIS — F17200 Nicotine dependence, unspecified, uncomplicated: Secondary | ICD-10-CM

## 2021-10-22 DIAGNOSIS — I272 Pulmonary hypertension, unspecified: Secondary | ICD-10-CM

## 2021-10-22 DIAGNOSIS — I739 Peripheral vascular disease, unspecified: Secondary | ICD-10-CM

## 2021-10-22 DIAGNOSIS — I471 Supraventricular tachycardia: Secondary | ICD-10-CM

## 2021-10-22 NOTE — Progress Notes (Unsigned)
Primary Care Provider: [facility] Cardiologist: Glenetta Hew, MD PV Cardiologist: [name], MD Electrophysiologist: None  Clinic Note: No chief complaint on file.   ===================================  ASSESSMENT/PLAN   Problem List Items Addressed This Visit   None   ===================================  HPI:    Alexandra [name] is a 71 y.o. female with a PMH below who presents today for ***.  PMH notable for CAD, Severe AS-s/p TAVR, Chronic HFpEF, Resistant (labile) HTN, Pulomnary HTN & COPD (long-term smoker) who presents today for 3 month f/u.   12/30/2017: TAVR (23 mm Edwards SAPIEN 3 THP via TF approach  TTE 08/02/2020: EF 60 to 65%.  GR 1 DD.  Elevated LAP/LVEDP.  Mildly elevated PAP.  Mild LA dilation.  23 mm SAPIEN 3 prosthetic TAVR valve present.  Mild perivalvular leak.  Mean gradient 16 mmHg. Mod MR.  Alexandra [name] was last seen on Aug 07, 2020 overall doing better.  Her cough is improved.  She does have frequent nocturia which bothers her some.  She is only taking Lasix maybe 2-3 times a week and swelling seems to be controlled.  She has not really lost any weight since her last visit, but her breathing seems to have improved.  Her right leg claudication has definitely improved, but is still there.  No resting symptoms.  She had questions about her kidney artery Dopplers which will be reviewed with [name] tomorrow.  Her pressures been elevated, and I think this could be related to renal artery stenosis.  Interestingly, although the SFA occlusions and iliac artery diseases was noted in her CT angiogram pre-TAVR, there was no comment on kidney size or renal artery stenosis.   She seems to doing pretty well with baseline exertional dyspnea, but overall the gurgling sensation in her chest seems to be better now.  Energy level has improved some, but not fully.  She denies any rapid regular heartbeats or palpitations.  No PND,  orthopnea, well-controlled edema.  Recent Hospitalizations:  06/06/2021 - Renal A Angio-Stent  She has been following up with [name]  for PAD - Renal; A stensosis. - last visit 7/11  Reviewed  CV studies:    The following studies were reviewed today: (if available, images/films reviewed: From Epic Chart or Care Everywhere) Renal Artery Angio-Intervention 06/06/2021:  1.  Severe 90% ostial stenosis of the right renal artery.  The left renal artery was not imaged but is known to be diseased with small kidney (likely nonviable).  2.  Successful stent placement to the right renal artery via the left radial artery approach.   Recommendations: Continue dual antiplatelet therapy. Aggressive treatment of risk factors.   Interval History:   Alexandra [name]   CV Review of Symptoms (Summary) Cardiovascular ROS: {roscv:310661}  REVIEWED OF SYSTEMS   ROS   Positive for malaise/fatigue (Energy level is better, but not great). Negative for weight loss.  HENT:  Negative for congestion (This is improved.) and nosebleeds.   Respiratory:  Positive for cough (Still has intermittent cough, but better) and shortness of breath (Baseline). Negative for sputum production.   Cardiovascular:  Positive for claudication (Improved, no resting claudication) and leg swelling (Controlled).  Gastrointestinal:  Negative for abdominal pain, blood in stool, constipation and melena.  Genitourinary:  Positive for frequency (Mostly nocturia). Negative for hematuria.  Musculoskeletal:  Positive for joint pain. Negative for falls and myalgias.  Neurological:  Positive for headaches. Negative for dizziness, focal weakness and weakness.  Psychiatric/Behavioral:  Negative for depression (She does not claim to be depressed, although she seems to be somewhat down in mood, is not as active as she had been, and not eating as well.) and   I have reviewed and (if needed) personally updated the patient's problem list,  medications, allergies, past medical and surgical history, social and family history.   PAST MEDICAL HISTORY   Past Medical History:  Diagnosis Date   Arthritis    COPD (chronic obstructive pulmonary disease) ([place]) 05/22/2016   Severely decreased DLCO by PFTs in April 2019 but only mild obstructive disease.    Coronary artery disease, non-occlusive 11/28/2016   R&LHC: 65% distal LAD lesion -- likely not angiographically significant->medical management. Normal LVEF. Moderately elevated LVEDP. I aortic valve gradient in the Cath Lab, moderate aortic stenosis. This would suggest that the stenosis is on the moderate side of moderate to severe.  Severe pulmonary hypertension (likely mixed).  AVA 0.96 cm; P-P gradient ~ 30 mmHg, Mean ~24.5-27.5 (Moderate AS)    GERD (gastroesophageal reflux disease)    Heart murmur    Hx of adenomatous colonic polyps 10/19/2015   Hyperlipidemia    Hypertension    Moderate mitral regurgitation by prior echocardiography 10/2016   Echocardiogram revealed moderate mitral regurgitation; most recent Echo 02/12/2019- MAC with moderate MR   Multiple renal cysts 06/2020   Renal A Dopplers: Exophytic Cyst noted in Upper pole of Right Kidney (1.6x1.7x1.7 cm); Exophytic cysts: lower pole of Left Kidney 1.9 x 1.7 x 1.9 cm, and 1.4 x 1.2 x 1.5 cm.   PAOD (peripheral arterial occlusive disease) ([place]) 06/2020   Abd AoGram-BLE Runoff: Severe Bilat RA stenosis - small. atretic L RA. Severe stenosis of R Com Iliac A - Ext Iliac A (PTA-Stent). Bilateral SFA flush CTO. Mod R CFA, L Iliac & CFA. Bilateral SFA reconstitution distal Pop A. R PTA flush CTO 2 V runoff. L LE 3 V runoff unitl distal LPTA CTO.   Small Infrarenal aneurysmal areas with extensive atheroma - no obstructive disease   Pulmonary hypertension associated Aortic Stenosis, Mitral Regurgitation & COPD    Peak PAP by Echo ~54 mmHg;  by Cath PAP/Mean 71/32 mmHg, 53 mmHg (with PCWP & LVEDP ~24-26 mmHg); TPG ~26 mmHg    Pulmonary nodules    Renal artery stenosis, native, bilateral (H[facility]) 06/2020   Abd Ao Gram: LRA ~ 99% ostial stenosis with small atretic L Kidney (f/u dopplers - Abnormal L kidney size & Resistive Index); R RA ostial 80-90% stenosis (confirmed by doppler Resistive Index, but normal R kidney size.   S/P TAVR (transcatheter aortic valve replacement) 12/30/2017   Edward's Sapien 3 bioprosthetic THB via TF approach   Severe aortic stenosis 2018   Underwent TAVR in 2019   Superior mesenteric artery stenosis ([place]) 06/2020   Abdominal Ao Dopplers - 70-90% Ostial SMA stenosis with normal Celiac A.    PAST SURGICAL HISTORY   Past Surgical History:  Procedure Laterality Date   ABDOMINAL AORTOGRAM W/LOWER EXTREMITY N/A 07/12/2020   Procedure: ABDOMINAL AORTOGRAM W/LOWER EXTREMITY;  Surgeon: Wellington Hampshire, MD;  [facility];; Severe B-RA stenosis - sm/atretic L RA. Severe Dz R Com-Ext Iliac A (PTA-Stent). B-SFA flush CTO. Mod R CFA, L Iliac-CFA. B-SFA recon @ dPop A. R PTA flush CTO & 2 V runoff. L LE 3 V runoff w/ distal LPTA CTO.   Sm InfraRenal aneurysms w/ extensive atheroma - no obstructive Dz   APPENDECTOMY     BREAST CYST EXCISION  Right 1970   CARDIAC VALVE REPLACEMENT     CATARACT EXTRACTION W/ INTRAOCULAR LENS IMPLANT Right    CHOLECYSTECTOMY OPEN     COLONOSCOPY  2003, 2017   CTA Chest  09/2016   Thoracic Aortic Ca2+ w/o dilation.  Coronary Calcification noted. PA normal. Mild Emphysematous changes w/ mild LLL scarring.     FRACTURE SURGERY     2007 -right tib /fib fracture ----07/2009 right femur fx after a fall   FRACTURE SURGERY Right 2011   Femur   PERIPHERAL VASCULAR CATHETERIZATION N/A 08/09/2014   Procedure: Aortic Arch Angiography;  Surgeon: Serafina Mitchell, MD;  Location: [place] [facility];  Service: Cardiovascular: Type 1 Arch. No significant stenosis, aneurysmal degeneration or dissection.  No luminal irregularity seen in R or L SubClavian, Brachial or Radial  A. Chronic distal ulnar A occlusion Bilatera.   PERIPHERAL VASCULAR CATHETERIZATION Right 08/09/2014   Procedure: Upper Extremity Angiography;  Surgeon: Serafina Mitchell, MD;  Location: [place] [facility];  Service: Cardiovascular;  Laterality: Right;   PERIPHERAL VASCULAR INTERVENTION  07/12/2020   Procedure: PERIPHERAL VASCULAR INTERVENTION;  Surgeon: Wellington Hampshire, MD;  Location: [place] [facility];  Service: Cardiovascular;;  R Common-External Iliac A PTA & Stent   RENAL ANGIOGRAPHY N/A 06/06/2021   Procedure: RENAL ANGIOGRAPHY;  Surgeon: Wellington Hampshire, MD;  Location: [place] [facility];  Service: Cardiovascular;  Laterality: N/A;   RENAL INTERVENTION  06/06/2021   Procedure: RENAL INTERVENTION;  Surgeon: Wellington Hampshire, MD;  Location: [place] [facility];  Service: Cardiovascular;;   RIGHT/LEFT HEART CATH AND CORONARY ANGIOGRAPHY N/A 12/06/2016   Procedure: RIGHT/LEFT HEART CATH AND CORONARY ANGIOGRAPHY;  Surgeon: Leonie Man, MD;  [facility]: Cor Angio: 65% dLAD (Med Rx). AVA 0.96 cm; P-P gradient ~ 30 mmHg, Mean ~24.5-27.5 (Mod AS); RHC #s: RAP 8 mmHg, RVP/EDP: 71/8/15 mmHg, PCWP: 21-24 mmHg, PAP/mean: 71/32/52 mmHg = Severe Mixed Pulmonary HTN; LVP/EDP 205/17/26 mmHg ; CO/CI by Fick: 4.35, 2.44 - mildly reduced   RIGHT/LEFT HEART CATH AND CORONARY ANGIOGRAPHY N/A 11/20/2017   Procedure: RIGHT/LEFT HEART CATH AND CORONARY ANGIOGRAPHY;  Surgeon: Larey Dresser, MD;  Location: [place] [facility];  Service: Cardiovascular;  Laterality: N/A;   TEE WITHOUT CARDIOVERSION N/A 12/30/2017   Procedure: (Intra-Op) TRANSESOPHAGEAL ECHOCARDIOGRAM (TEE);  Surgeon: Burnell Blanks, MD;  Location: [place];  Service: Open Heart Surgery; Post TAVR good position of 23 mm AP and 3 valve.  No new or WMA.  EF 55-60%.  No effusion.  Mild perivalvular regurgitation seen.  Mean gradient 7 mmHg.  Peak 16 mmHg.   TOTAL KNEE ARTHROPLASTY Left 12/01/2018   Procedure: Left Knee  Arthroplasty;  Surgeon: Melrose Nakayama, MD;  Location: [facility];  Service: Orthopedics;  Laterality: Left;   TRANSCATHETER AORTIC VALVE REPLACEMENT, TRANSFEMORAL N/A 12/30/2017   Procedure: TRANSCATHETER AORTIC VALVE REPLACEMENT, TRANSFEMORAL;  Surgeon: Burnell Blanks, MD;  Location: [place];  Service: Open Heart Surgery;  Laterality: N/A; 23 mm Edwards SAPIEN 3 THV   TRANSTHORACIC ECHOCARDIOGRAM  10/30/2016   Mild Concentric LVH. EF 55-60%. No RWMA, Gr 2 DD. Mod-Severe AS (mean-peak Gradient 31 mmHG - 64 mmHg), Mod MR. Mod LA dilation, Mod PA HTN - peak pressure ~54 mmHg). = Progression of AS from 2014   TRANSTHORACIC ECHOCARDIOGRAM  01/2019   EF 55 to 60%.  Elevated LVEDP.  GR 1 DD.  No R WMA.  Moderate LA dilation.  Well-seated stented bioprosthetic aortic valve.  Mean gradient 13 mercury.  Moderate MAC moderate MR.  No evidence of mildly elevated RAP/CVP and moderately elevated PA P   TRANSTHORACIC ECHOCARDIOGRAM  10/2017   a) Pre-TAVR 10/31/17: EF 55 to 60%.  GR 1 DD.  Calcific severe AS.  Peak Grad 62 mmHg, mean 32 mmHg.  Progression of Dz.  AVA ~0.8 cm.; b) Post TAVR 10/9/'19: EF 60-65%.  GR 1 DD.  Stable appearing 23 mm Edwards's Sapien AV bioprosthesis.  No perivalvular AI.  Mean Grad 22 mmHg; c) 11/13/'19: AoV mean Grad 19 mmHg,, Peak 36 mmHg. PAP ~33 mmHg   TRANSTHORACIC ECHOCARDIOGRAM  08/02/2020   EF 60 to 65%.  GR 1 DD.  Elevated LAP/LVEDP.  Mildly elevated PAP.  Mild LA dilation.  23 mm SAPIEN 3 prosthetic TAVR valve present.  Mild perivalvular leak.  Mean gradient 16 mmHg. Mod MR.   ULTRASOUND GUIDANCE FOR VASCULAR ACCESS  08/09/2014   Procedure: Ultrasound Guidance For Vascular Access;  Surgeon: Serafina Mitchell, MD;  Location: [place] [facility];  Service: Cardiovascular;;    Immunization History  Administered Date(s) Administered   Fluad Quad(high Dose 65+) 12/02/2018   Influenza,inj,Quad PF,6+ Mos 02/20/2016   Influenza-Unspecified 11/17/2016   PFIZER Comirnaty(Gray  Top)Covid-19 Tri-Sucrose Vaccine 08/23/2020   PFIZER(Purple Top)SARS-COV-2 Vaccination 07/08/2019, 07/29/2019, 03/07/2020   Pneumococcal Conjugate-13 08/15/2015   Pneumococcal Polysaccharide-23 12/24/2016   Td 08/15/2015   Tdap 07/23/2009   Zoster Recombinat (Shingrix) 07/31/2016, 11/17/2016    MEDICATIONS/ALLERGIES   Current Meds  Medication Sig   albuterol (VENTOLIN HFA) 108 (90 Base) MCG/ACT inhaler Inhale 2 puffs into the lungs every 6 (six) hours as needed for shortness of breath.   aspirin 81 MG tablet Take 1 tablet (81 mg total) by mouth 2 (two) times daily at 10 AM and 5 PM. (Patient taking differently: Take 81 mg by mouth 3 (three) times a week.)   bisoprolol-hydrochlorothiazide (ZIAC) 10-6.25 MG tablet Take 1 tablet by mouth daily.   clopidogrel (PLAVIX) 75 MG tablet TAKE 1 TABLET(75 MG) BY MOUTH DAILY   cyclobenzaprine (FLEXERIL) 10 MG tablet Take 10 mg by mouth at bedtime.   furosemide (LASIX) 20 MG tablet TAKE 1 TABLET BY MOUTH DAILY AS NEEDED FOR INCREASE SWELLING   losartan (COZAAR) 100 MG tablet Take 1 tablet (100 mg total) by mouth daily.   oxyCODONE-acetaminophen (PERCOCET) 10-325 MG tablet Take 1 tablet by mouth 5 (five) times daily.   potassium chloride SA (KLOR-CON) 20 MEQ tablet TAKE 1 TABLET(20 MEQ) BY MOUTH DAILY   rosuvastatin (CRESTOR) 20 MG tablet TAKE 1 TABLET(20 MG) BY MOUTH DAILY   tiZANidine (ZANAFLEX) 4 MG tablet 1 tablet as needed Orally once at night for 30 days    Allergies  Allergen Reactions   Codeine Nausea Only   Symproic [Naldemedine] Nausea And Vomiting    SOCIAL HISTORY/FAMILY HISTORY   Reviewed in Epic:  Pertinent findings:  Social History   Tobacco Use   Smoking status: Light Smoker    Packs/day: 0.10    Years: 40.00    Total pack years: 4.00    Types: Cigarettes   Smokeless tobacco: Never   Tobacco comments:    1-2 cigarettes a day  Vaping Use   Vaping Use: Never used  Substance Use Topics   Alcohol use: Not Currently     Alcohol/week: 0.0 standard drinks of alcohol   Drug use: Never    Comment: +cocaine in urine 12/19/2014; pt denies this hx on 12/30/2017   Social History   Social  History Narrative   Lives with her husband and her middle son.   Her two older sons are products of a previous relationship.   Her youngest son is from her current marriage.   Oldest and youngest sons live nearby.   She has total of 3 children and 6 grandchildren with 2 great-grandchildren.   She does not exercise because she has pain in her right leg.    OBJCTIVE -PE, EKG, labs   Wt Readings from Last 3 Encounters:  10/22/21 157 lb 12.8 oz (71.6 kg)  10/02/21 158 lb 12.8 oz (72 kg)  06/26/21 158 lb (71.7 kg)    Physical Exam: BP (!) 142/80   Pulse 78   Ht _0  (1.651 m)   Wt 157 lb 12.8 oz (71.6 kg)   SpO2 98%   BMI 26.26 kg/m  Physical Exam  Appearance: Normal appearance. She is normal weight. She is ill-appearing (Mild chronically ill-appearing). She is not toxic-appearing.  HENT:     Head: Normocephalic and atraumatic.  Neck:     Vascular: Carotid bruit (Cannot exclude true carotid bruit versus referred aortic murmur) present. No hepatojugular reflux or JVD.  Cardiovascular:     Rate and Rhythm: Normal rate and regular rhythm. No extrasystoles are present.    Chest Wall: PMI is not displaced.     Pulses: Decreased pulses.          Femoral pulses are  on the right side with bruit and  on the left side with bruit.      Popliteal pulses are 1+ on the right side and 1+ on the left side.       Dorsalis pedis pulses are 1+ on the right side and 1+ on the left side.       Posterior tibial pulses are 0 on the right side and 0 on the left side.     Heart sounds: Murmur (2/6C-D SEM at RUSB--neck.) heard.  High-pitched blowing holosystolic murmur of grade 2/6 is also present at the apex radiating to the axilla.    No friction rub. No gallop.  Pulmonary:     Effort: Pulmonary effort is normal. No respiratory  distress.     Comments: Mild diffuse interstitial sounds/crackles but no wheezes rales or rhonchi. Chest:     Chest wall: No tenderness.  Musculoskeletal:        General: No swelling. Normal range of motion.     Cervical back: Normal range of motion and neck supple.  Skin:    General: Skin is warm and dry.  Neurological:     General: No focal deficit present.     Mental Status: She is alert and oriented to person, place, and time.  Psychiatric:        Behavior: Behavior normal.        Thought Content: Thought content normal.        Judgment: Judgment normal.     Comments: She still seems a little bit down admitted.    Adult ECG Report  Rate: *** ;  Rhythm: {rhythm:17366};   Narrative Interpretation: ***  Recent Labs:  ***  Lab Results  Component Value Date   CHOL 125 10/02/2021   HDL 38 (L) 10/02/2021   LDLCALC 63 10/02/2021   TRIG 138 10/02/2021   CHOLHDL 3.3 10/02/2021   Lab Results  Component Value Date   CREATININE 0.96 10/02/2021   BUN 11 10/02/2021   NA 138 10/02/2021   K 4.8 10/02/2021   CL 101  10/02/2021   CO2 23 10/02/2021      Latest Ref Rng & Units 05/31/2021    2:25 PM 08/08/2020   10:58 AM 06/27/2020   11:49 AM  CBC  WBC 3.4 - 10.8 x10E3/uL 10.1  9.7  11.0   Hemoglobin 11.1 - 15.9 g/dL 12.2  13.3  13.8   Hematocrit 34.0 - 46.6 % 37.7  40.8  40.9   Platelets 150 - 450 x10E3/uL 238  204  206     Lab Results  Component Value Date   HGBA1C 5.5 12/29/2017   Lab Results  Component Value Date   TSH 2.140 10/02/2021    ================================================== I spent a total of ***minutes with the patient spent in direct patient consultation.  Additional time spent with chart review  / charting (studies, outside notes, etc): *** min Total Time: *** min  Current medicines are reviewed at length with the patient today.  (+/- concerns) ***  Notice: This dictation was prepared with Dragon dictation along with smart phrase technology. Any  transcriptional errors that result from this process are unintentional and may not be corrected upon review.  Studies Ordered:   No orders of the defined types were placed in this encounter.  No orders of the defined types were placed in this encounter.   Patient Instructions / Medication Changes & Studies & Tests Ordered   There are no Patient Instructions on file for this visit.     Delane Ginger, M.D., M.S. Interventional Cardiologist  [place]  Pager # [phone] Phone # [phone] [address] [place], [place] [address]   Thank you for choosing [facility]!!

## 2021-10-22 NOTE — Patient Instructions (Signed)
Medication Instructions:  No changes  *If you need a refill on your cardiac medications before your next appointment, please call your pharmacy*   Lab Work: Not needed   Testing/Procedures:  Will be schedule in May 2024  At [facility] has requested that you have an echocardiogram. Echocardiography is a painless test that uses sound waves to create images of your heart. It provides your doctor with information about the size and shape of your heart and how well your heart's chambers and valves are working. This procedure takes approximately one hour. There are no restrictions for this procedure.    Follow-Up: At [facility], you and your health needs are our priority.  As part of our continuing mission to provide you with exceptional heart care, we have created designated Provider Care Teams.  These Care Teams include your primary Cardiologist (physician) and Advanced Practice Providers (APPs -  Physician Assistants and Nurse Practitioners) who all work together to provide you with the care you need, when you need it.  We recommend signing up for the patient portal called "MyChart".  Sign up information is provided on this After Visit Summary.  MyChart is used to connect with patients for Virtual Visits (Telemedicine).  Patients are able to view lab/test results, encounter notes, upcoming appointments, etc.  Non-urgent messages can be sent to your provider as well.   To learn more about what you can do with MyChart, go to [email or web address].    Your next appointment:   12 month(s)  The format for your next appointment:   In Person  Provider:   Glenetta Hew, MD {  If primary card or EP is not listed click here to update      Important Information About Sugar

## 2021-10-22 NOTE — Progress Notes (Incomplete)
Primary Care Provider: [facility] Cardiologist: Glenetta Hew, MD PV Cardiologist: [name], MD Electrophysiologist: None  Clinic Note: No chief complaint on file.  ===================================  ASSESSMENT/PLAN   Problem List Items Addressed This Visit   None  ===================================  HPI:    Alexandra [name] is a 71 y.o. female with a PMH notable for CAD, Severe AS-s/p TAVR, Chronic HFpEF, Resistant (labile) HTN, Pulomnary HTN & COPD (long-term smoker) who presents today for delayed annual follow-up   12/30/2017: TAVR (23 mm Edwards SAPIEN 3 THP via TF approach  TTE 08/02/2020: EF 60 to 65%.  GR 1 DD.  Elevated LAP/LVEDP.  Mildly elevated PAP.  Mild LA dilation.  23 mm SAPIEN 3 prosthetic TAVR valve present.  Mild perivalvular leak.  Mean gradient 16 mmHg. Mod MR.  Alexandra [name] was last seen on Aug 07, 2020-overall doing well.  Better.  She is having cough that was notably improved.  Occasionally noted some nocturia or other.  May be taking Lasix 2 or 3 times a week with well-controlled swelling.  Breathing was notably improved.  Her right leg claudication improved but was still there.  No resting symptoms.  She was due to see [name] (who is following her peripheral vascular disease) for her renal artery stenosis.  She did have elevated BP levels possibly related to RAS. => Baseline DOE but much improved "gurgling "sensation in her chest.  Energy level also improved.  No signs or symptoms of arrhythmia.  No heart failure symptoms of PND, orthopnea or edema.  Dr Fletcher Anon: She was seen on 08/08/2020 severe bilateral renal artery stenosis seen on her previous PV abdominal aortic angiogram.  Left kidney was noted to be atrophied.  At the time of the initial angiogram she had self-expanding stent placement into the distal right common iliac into external iliac artery. Renal duplex confirmed atraumatic findings of severe bilateral  renal artery stenosis (RAS), however the left kidney was only 7.8 cm in length indicating atrophy.  She was noted to be on 45 blood pressure medications. => Noted for refractory HTN in spite of 4 different antihypertensives as an indication for renal artery revascularization (preferably via radial artery access. Initial plans for R Renal A stenting were delayed because of a fall in December 2022.  BP seem to be relatively controlled.  No CHF symptoms. => Reviewed, reviewed revealed worsening renal function as an indication to proceed with R RA stent  Recent Hospitalizations:  06/06/2021 - Renal A Angio-Stent  She has been following up with [name]  for PAD - Renal; A stensosis. - last visit 7/11  Reviewed  CV studies:    The following studies were reviewed today: (if available, images/films reviewed: From Epic Chart or Care Everywhere) Renal Artery Angio-Intervention 06/06/2021:  1.  Severe 90% ostial stenosis of the right renal artery.  The left renal artery was not imaged but is known to be diseased with small kidney (likely nonviable).  2.  Successful stent placement to the right renal artery via the left radial artery approach.   Recommendations: Continue dual antiplatelet therapy. Aggressive treatment of risk factors.   Interval History:   Alexandra [name]   CV Review of Symptoms (Summary) Cardiovascular ROS: {roscv:310661}  REVIEWED OF SYSTEMS   ROS   Positive for malaise/fatigue (Energy level is better, but not great). Negative for weight loss.  HENT:  Negative for congestion (This is improved.) and nosebleeds.   Respiratory:  Positive for cough (Still  has intermittent cough, but better) and shortness of breath (Baseline). Negative for sputum production.   Cardiovascular:  Positive for claudication (Improved, no resting claudication) and leg swelling (Controlled).  Gastrointestinal:  Negative for abdominal pain, blood in stool, constipation and melena.  Genitourinary:   Positive for frequency (Mostly nocturia). Negative for hematuria.  Musculoskeletal:  Positive for joint pain. Negative for falls and myalgias.  Neurological:  Positive for headaches. Negative for dizziness, focal weakness and weakness.  Psychiatric/Behavioral:  Negative for depression (She does not claim to be depressed, although she seems to be somewhat down in mood, is not as active as she had been, and not eating as well.) and   I have reviewed and (if needed) personally updated the patient's problem list, medications, allergies, past medical and surgical history, social and family history.   PAST MEDICAL HISTORY   Past Medical History:  Diagnosis Date  . Arthritis   . COPD (chronic obstructive pulmonary disease) ([place]) 05/22/2016   Severely decreased DLCO by PFTs in April 2019 but only mild obstructive disease.   . Coronary artery disease, non-occlusive 11/28/2016   R&LHC: 65% distal LAD lesion -- likely not angiographically significant->medical management. Normal LVEF. Moderately elevated LVEDP. I aortic valve gradient in the Cath Lab, moderate aortic stenosis. This would suggest that the stenosis is on the moderate side of moderate to severe.  Severe pulmonary hypertension (likely mixed).  AVA 0.96 cm; P-P gradient ~ 30 mmHg, Mean ~24.5-27.5 (Moderate AS)   . GERD (gastroesophageal reflux disease)   . Heart murmur   . Hx of adenomatous colonic polyps 10/19/2015  . Hyperlipidemia   . Hypertension   . Moderate mitral regurgitation by prior echocardiography 10/2016   Echocardiogram revealed moderate mitral regurgitation; most recent Echo 02/12/2019- MAC with moderate MR  . Multiple renal cysts 06/2020   Renal A Dopplers: Exophytic Cyst noted in Upper pole of Right Kidney (1.6x1.7x1.7 cm); Exophytic cysts: lower pole of Left Kidney 1.9 x 1.7 x 1.9 cm, and 1.4 x 1.2 x 1.5 cm.  [name] PAOD (peripheral arterial occlusive disease) ([place]) 06/2020   Abd AoGram-BLE Runoff: Severe Bilat RA stenosis -  small. atretic L RA. Severe stenosis of R Com Iliac A - Ext Iliac A (PTA-Stent). Bilateral SFA flush CTO. Mod R CFA, L Iliac & CFA. Bilateral SFA reconstitution distal Pop A. R PTA flush CTO 2 V runoff. L LE 3 V runoff unitl distal LPTA CTO.   Small Infrarenal aneurysmal areas with extensive atheroma - no obstructive disease  . Pulmonary hypertension associated Aortic Stenosis, Mitral Regurgitation & COPD    Peak PAP by Echo ~54 mmHg;  by Cath PAP/Mean 71/32 mmHg, 53 mmHg (with PCWP & LVEDP ~24-26 mmHg); TPG ~26 mmHg  . Pulmonary nodules   . Renal artery stenosis, native, bilateral ([place]) 06/2020   Abd Ao Gram: LRA ~ 99% ostial stenosis with small atretic L Kidney (f/u dopplers - Abnormal L kidney size & Resistive Index); R RA ostial 80-90% stenosis (confirmed by doppler Resistive Index, but normal R kidney size.  . S/P TAVR (transcatheter aortic valve replacement) 12/30/2017   Edward's Sapien 3 bioprosthetic THB via TF approach  . Severe aortic stenosis 2018   Underwent TAVR in 2019  . Superior mesenteric artery stenosis ([place]) 06/2020   Abdominal Ao Dopplers - 70-90% Ostial SMA stenosis with normal Celiac A.    PAST SURGICAL HISTORY   Past Surgical History:  Procedure Laterality Date  . ABDOMINAL AORTOGRAM W/LOWER EXTREMITY N/A 07/12/2020   Procedure: ABDOMINAL  AORTOGRAM W/LOWER EXTREMITY;  Surgeon: Wellington Hampshire, MD;  [facility];; Severe B-RA stenosis - sm/atretic L RA. Severe Dz R Com-Ext Iliac A (PTA-Stent). B-SFA flush CTO. Mod R CFA, L Iliac-CFA. B-SFA recon @ dPop A. R PTA flush CTO & 2 V runoff. L LE 3 V runoff w/ distal LPTA CTO.   Sm InfraRenal aneurysms w/ extensive atheroma - no obstructive Dz  . APPENDECTOMY    . BREAST CYST EXCISION Right 1970  . CARDIAC VALVE REPLACEMENT    . CATARACT EXTRACTION W/ INTRAOCULAR LENS IMPLANT Right   . CHOLECYSTECTOMY OPEN    . COLONOSCOPY  2003, 2017  . CTA Chest  09/2016   Thoracic Aortic Ca2+ w/o dilation.  Coronary  Calcification noted. PA normal. Mild Emphysematous changes w/ mild LLL scarring.    [name] FRACTURE SURGERY     2007 -right tib /fib fracture ----07/2009 right femur fx after a fall  . FRACTURE SURGERY Right 2011   Femur  . PERIPHERAL VASCULAR CATHETERIZATION N/A 08/09/2014   Procedure: Aortic Arch Angiography;  Surgeon: Serafina Mitchell, MD;  Location: [facility];  Service: Cardiovascular: Type 1 Arch. No significant stenosis, aneurysmal degeneration or dissection.  No luminal irregularity seen in R or L SubClavian, Brachial or Radial A. Chronic distal ulnar A occlusion [place].  [name] PERIPHERAL VASCULAR CATHETERIZATION Right 08/09/2014   Procedure: Upper Extremity Angiography;  Surgeon: Serafina Mitchell, MD;  Location: [facility];  Service: Cardiovascular;  Laterality: Right;  . PERIPHERAL VASCULAR INTERVENTION  07/12/2020   Procedure: PERIPHERAL VASCULAR INTERVENTION;  Surgeon: Wellington Hampshire, MD;  Location: [facility];  Service: Cardiovascular;;  R Common-External Iliac A PTA & Stent  . RENAL ANGIOGRAPHY N/A 06/06/2021   Procedure: RENAL ANGIOGRAPHY;  Surgeon: Wellington Hampshire, MD;  Location: [facility];  Service: Cardiovascular;  Laterality: N/A;  . RENAL INTERVENTION  06/06/2021   Procedure: RENAL INTERVENTION;  Surgeon: Wellington Hampshire, MD;  Location: [facility];  Service: Cardiovascular;;  . RIGHT/LEFT HEART CATH AND CORONARY ANGIOGRAPHY N/A 12/06/2016   Procedure: RIGHT/LEFT HEART CATH AND CORONARY ANGIOGRAPHY;  Surgeon: Leonie Man, MD;  Location: [facility]: Cor Angio: 65% dLAD (Med Rx). AVA 0.96 cm; P-P gradient ~ 30 mmHg, Mean ~24.5-27.5 (Mod AS); RHC #s: RAP 8 mmHg, RVP/EDP: 71/8/15 mmHg, PCWP: 21-24 mmHg, PAP/mean: 71/32/52 mmHg = Severe Mixed Pulmonary HTN; LVP/EDP 205/17/26 mmHg ; CO/CI by Fick: 4.35, 2.44 - mildly reduced  . RIGHT/LEFT HEART CATH AND CORONARY ANGIOGRAPHY N/A 11/20/2017   Procedure: RIGHT/LEFT HEART CATH AND CORONARY  ANGIOGRAPHY;  Surgeon: Larey Dresser, MD;  Location: [facility];  Service: Cardiovascular;  Laterality: N/A;  . TEE WITHOUT CARDIOVERSION N/A 12/30/2017   Procedure: (Intra-Op) TRANSESOPHAGEAL ECHOCARDIOGRAM (TEE);  Surgeon: Burnell Blanks, MD;  Location: [facility];  Service: Open Heart Surgery; Post TAVR good position of 23 mm AP and 3 valve.  No new or WMA.  EF 55-60%.  No effusion.  Mild perivalvular regurgitation seen.  Mean gradient 7 mmHg.  Peak 16 mmHg.  [name] TOTAL KNEE ARTHROPLASTY Left 12/01/2018   Procedure: Left Knee Arthroplasty;  Surgeon: [place] Nakayama, MD;  Location: [facility];  Service: Orthopedics;  Laterality: Left;  . TRANSCATHETER AORTIC VALVE REPLACEMENT, TRANSFEMORAL N/A 12/30/2017   Procedure: TRANSCATHETER AORTIC VALVE REPLACEMENT, TRANSFEMORAL;  Surgeon: Burnell Blanks, MD;  Location: [facility];  Service: Open Heart Surgery;  Laterality: N/A; 23 mm Edwards SAPIEN  3 THV  . TRANSTHORACIC ECHOCARDIOGRAM  10/30/2016   Mild Concentric LVH. EF 55-60%. No RWMA, Gr 2 DD. Mod-Severe AS (mean-peak Gradient 31 mmHG - 64 mmHg), Mod MR. Mod LA dilation, Mod PA HTN - peak pressure ~54 mmHg). = Progression of AS from 2014  . TRANSTHORACIC ECHOCARDIOGRAM  01/2019   EF 55 to 60%.  Elevated LVEDP.  GR 1 DD.  No R WMA.  Moderate LA dilation.  Well-seated stented bioprosthetic aortic valve.  Mean gradient 13 mercury.  Moderate MAC moderate MR.  No evidence of mildly elevated RAP/CVP and moderately elevated PA P  . TRANSTHORACIC ECHOCARDIOGRAM  10/2017   a) Pre-TAVR 10/31/17: EF 55 to 60%.  GR 1 DD.  Calcific severe AS.  Peak Grad 62 mmHg, mean 32 mmHg.  Progression of Dz.  AVA ~0.8 cm.; b) Post TAVR 10/9/'19: EF 60-65%.  GR 1 DD.  Stable appearing 23 mm Edwards's Sapien AV bioprosthesis.  No perivalvular AI.  Mean Grad 22 mmHg; c) 11/13/'19: AoV mean Grad 19 mmHg,, Peak 36 mmHg. PAP ~33 mmHg  . TRANSTHORACIC ECHOCARDIOGRAM  08/02/2020   EF 60 to 65%.  GR 1 DD.  Elevated LAP/LVEDP.   Mildly elevated PAP.  Mild LA dilation.  23 mm SAPIEN 3 prosthetic TAVR valve present.  Mild perivalvular leak.  Mean gradient 16 mmHg. Mod MR.  [name] ULTRASOUND GUIDANCE FOR VASCULAR ACCESS  08/09/2014   Procedure: Ultrasound Guidance For Vascular Access;  Surgeon: Serafina Mitchell, MD;  Location: [facility];  Service: Cardiovascular;;    Immunization History  Administered Date(s) Administered  . Fluad Quad(high Dose 65+) 12/02/2018  . Influenza,inj,Quad PF,6+ Mos 02/20/2016  . Influenza-Unspecified 11/17/2016  . PFIZER Comirnaty(Gray Top)Covid-19 Tri-Sucrose Vaccine 08/23/2020  . PFIZER(Purple Top)SARS-COV-2 Vaccination 07/08/2019, 07/29/2019, 03/07/2020  . Pneumococcal Conjugate-13 08/15/2015  . Pneumococcal Polysaccharide-23 12/24/2016  . Td 08/15/2015  . Tdap 07/23/2009  . Zoster Recombinat (Shingrix) 07/31/2016, 11/17/2016    MEDICATIONS/ALLERGIES   Current Meds  Medication Sig  . albuterol (VENTOLIN HFA) 108 (90 Base) MCG/ACT inhaler Inhale 2 puffs into the lungs every 6 (six) hours as needed for shortness of breath.  [name] aspirin 81 MG tablet Take 1 tablet (81 mg total) by mouth 2 (two) times daily at 10 AM and 5 PM. (Patient taking differently: Take 81 mg by mouth 3 (three) times a week.)  . bisoprolol-hydrochlorothiazide (ZIAC) 10-6.25 MG tablet Take 1 tablet by mouth daily.  . clopidogrel (PLAVIX) 75 MG tablet TAKE 1 TABLET(75 MG) BY MOUTH DAILY  . cyclobenzaprine (FLEXERIL) 10 MG tablet Take 10 mg by mouth at bedtime.  . furosemide (LASIX) 20 MG tablet TAKE 1 TABLET BY MOUTH DAILY AS NEEDED FOR INCREASE SWELLING  . losartan (COZAAR) 100 MG tablet Take 1 tablet (100 mg total) by mouth daily.  [name] oxyCODONE-acetaminophen (PERCOCET) 10-325 MG tablet Take 1 tablet by mouth 5 (five) times daily.  . potassium chloride SA (KLOR-CON) 20 MEQ tablet TAKE 1 TABLET(20 MEQ) BY MOUTH DAILY  . rosuvastatin (CRESTOR) 20 MG tablet TAKE 1 TABLET(20 MG) BY MOUTH DAILY  . tiZANidine (ZANAFLEX)  4 MG tablet 1 tablet as needed Orally once at night for 30 days    Allergies  Allergen Reactions  . Codeine Nausea Only  . Symproic [Naldemedine] Nausea And Vomiting    SOCIAL HISTORY/FAMILY HISTORY   Reviewed in Epic:  Pertinent findings:  Social History   Tobacco Use  . Smoking status: Light Smoker    Packs/day: 0.10    Years: 40.00  Total pack years: 4.00    Types: Cigarettes  . Smokeless tobacco: Never  . Tobacco comments:    1-2 cigarettes a day  Vaping Use  . Vaping Use: Never used  Substance Use Topics  . Alcohol use: Not Currently    Alcohol/week: 0.0 standard drinks of alcohol  . Drug use: Never    Comment: +cocaine in urine 12/19/2014; pt denies this hx on 12/30/2017   Social History   Social History Narrative   Lives with her husband and her middle son.   Her two older sons are products of a previous relationship.   Her youngest son is from her current marriage.   Oldest and youngest sons live nearby.   She has total of 3 children and 6 grandchildren with 2 great-grandchildren.   She does not exercise because she has pain in her right leg.    OBJCTIVE -PE, EKG, labs   Wt Readings from Last 3 Encounters:  10/22/21 157 lb 12.8 oz (71.6 kg)  10/02/21 158 lb 12.8 oz (72 kg)  06/26/21 158 lb (71.7 kg)    Physical Exam: BP (!) 142/80   Pulse 78   Ht _0  (1.651 m)   Wt 157 lb 12.8 oz (71.6 kg)   SpO2 98%   BMI 26.26 kg/m  Physical Exam  Appearance: Normal appearance. She is normal weight. She is ill-appearing (Mild chronically ill-appearing). She is not toxic-appearing.  HENT:     Head: Normocephalic and atraumatic.  Neck:     Vascular: Carotid bruit (Cannot exclude true carotid bruit versus referred aortic murmur) present. No hepatojugular reflux or JVD.  Cardiovascular:     Rate and Rhythm: Normal rate and regular rhythm. No extrasystoles are present.    Chest Wall: PMI is not displaced.     Pulses: Decreased pulses.          Femoral  pulses are  on the right side with bruit and  on the left side with bruit.      Popliteal pulses are 1+ on the right side and 1+ on the left side.       Dorsalis pedis pulses are 1+ on the right side and 1+ on the left side.       Posterior tibial pulses are 0 on the right side and 0 on the left side.     Heart sounds: Murmur (2/6C-D SEM at RUSB--neck.) heard.  High-pitched blowing holosystolic murmur of grade 2/6 is also present at the apex radiating to the axilla.    No friction rub. No gallop.  Pulmonary:     Effort: Pulmonary effort is normal. No respiratory distress.     Comments: Mild diffuse interstitial sounds/crackles but no wheezes rales or rhonchi. Chest:     Chest wall: No tenderness.  Musculoskeletal:        General: No swelling. Normal range of motion.     Cervical back: Normal range of motion and neck supple.  Skin:    General: Skin is warm and dry.  Neurological:     General: No focal deficit present.     Mental Status: She is alert and oriented to person, place, and time.  Psychiatric:        Behavior: Behavior normal.        Thought Content: Thought content normal.        Judgment: Judgment normal.     Comments: She still seems a little bit down admitted.    Adult ECG Report  Rate: *** ;  Rhythm: {rhythm:17366};   Narrative Interpretation: ***  Recent Labs:  ***  Lab Results  Component Value Date   CHOL 125 10/02/2021   HDL 38 (L) 10/02/2021   LDLCALC 63 10/02/2021   TRIG 138 10/02/2021   CHOLHDL 3.3 10/02/2021   Lab Results  Component Value Date   CREATININE 0.96 10/02/2021   BUN 11 10/02/2021   NA 138 10/02/2021   K 4.8 10/02/2021   CL 101 10/02/2021   CO2 23 10/02/2021      Latest Ref Rng & Units 05/31/2021    2:25 PM 08/08/2020   10:58 AM 06/27/2020   11:49 AM  CBC  WBC 3.4 - 10.8 x10E3/uL 10.1  9.7  11.0   Hemoglobin 11.1 - 15.9 g/dL 12.2  13.3  13.8   Hematocrit 34.0 - 46.6 % 37.7  40.8  40.9   Platelets 150 - 450 x10E3/uL 238  204  206      Lab Results  Component Value Date   HGBA1C 5.5 12/29/2017   Lab Results  Component Value Date   TSH 2.140 10/02/2021    ================================================== I spent a total of ***minutes with the patient spent in direct patient consultation.  Additional time spent with chart review  / charting (studies, outside notes, etc): *** min Total Time: *** min  Current medicines are reviewed at length with the patient today.  (+/- concerns) ***  Notice: This dictation was prepared with Dragon dictation along with smart phrase technology. Any transcriptional errors that result from this process are unintentional and may not be corrected upon review.  Studies Ordered:   No orders of the defined types were placed in this encounter.  No orders of the defined types were placed in this encounter.   Patient Instructions / Medication Changes & Studies & Tests Ordered   There are no Patient Instructions on file for this visit.     Delane Ginger, M.D., M.S. Interventional Cardiologist  [place]  Pager # [phone] Phone # [phone] [address] [place], [place] [address]   Thank you for choosing [facility] [place]!!

## 2021-10-23 ENCOUNTER — Encounter: Payer: Self-pay | Admitting: Cardiology

## 2021-10-23 NOTE — Assessment & Plan Note (Signed)
Pretty stable heart failure symptoms.  Rarely using any additional Lasix.  Combination valvular heart disease as well as hypertensive heart disease with resistant hypertension.  Plan:  Continue high-dose losartan 100 mg daily (could consider switching to a more potent option, but with having hypotension issues.  Reluctant to be more aggressive.  Continue max dose of bisoprolol-HCTZ.  (10-6.25 mg)  No longer on amlodipine indicating improved blood pressure control.  She also takes furosemide as needed maybe once or twice a week.  But has been pretty well stabilized.  Plan to reassess echo in May 2024.

## 2021-10-23 NOTE — Assessment & Plan Note (Signed)
Presumably she is back on the rosuvastatin and tolerating relatively well.  Recent labs look pretty well-controlled on rosuvastatin 20 mg.  Tolerating relatively well.  Lipids look well controlled.

## 2021-10-23 NOTE — Assessment & Plan Note (Addendum)
S/p TAVR-October 2019

## 2021-10-23 NOTE — Assessment & Plan Note (Signed)
This point which is limiting her activity level as far as dyspnea goes.  Working on smoking cessation, not fully better. All she has is albuterol written.  She may benefit from other COPD meds.

## 2021-10-23 NOTE — Assessment & Plan Note (Signed)
Patient has had variable readings of the mitral valve severity on echocardiogram.  Hopefully with better blood pressure control, this will improve.  We will plan to reassess Echo in May 2024

## 2021-10-23 NOTE — Assessment & Plan Note (Signed)
S/p TAVR-October 2019 Functioning well.  Mean AOG 16 mmHg.  Moderate MR  Most recent Echo May 2022.  Plan: Reassess Echo May 2024  SBE prophylaxis discussed

## 2021-10-23 NOTE — Assessment & Plan Note (Signed)
Borderline moderate disease noted on cath prior to TAVR.  No active anginal symptoms. She is on aggressive CRF modification regimen with very well-controlled lipids as of earlier this month.  Plan: Continue beta-blocker-HCTZ along with ARB. She is on combination aspirin and Plavix for her PAD. Continue rosuvastatin.

## 2021-10-23 NOTE — Assessment & Plan Note (Signed)
Likely related to deconditioning and poor exercise tolerance.

## 2021-10-23 NOTE — Assessment & Plan Note (Signed)
She has extensive PAD, moderate CAD and valvular disease.  Recommended fully quitting.  She is all the way down to 2 cigarettes a day.  I think this is an emotional crutch for her.  Continue to encourage her to make the final stop.

## 2021-10-23 NOTE — Assessment & Plan Note (Signed)
Somewhat labile blood pressures.  A little high today, but her other most recent clinic visit had low pressures.  Therefore we will not make any further adjustment.  She is on combination beta-blocker ARB and HCTZ, high-dose ARB having been on a wean off of amlodipine after renal artery stenting.

## 2021-10-23 NOTE — Assessment & Plan Note (Addendum)
Followed by Dr. [name].  Right iliac stent with improved claudication.  Still has mild nonlimiting clinic calf claudications. She does have known bilateral SFA PTA occlusions that are being treated medically.  Also status post right renal artery stenting.  Due to follow-up with [name] later this year.  Continue aspirin, Plavix, statin along with antihypertensive agents.

## 2021-10-25 ENCOUNTER — Other Ambulatory Visit: Payer: Self-pay | Admitting: Cardiology

## 2021-10-27 ENCOUNTER — Other Ambulatory Visit: Payer: Self-pay | Admitting: Cardiology

## 2021-10-31 ENCOUNTER — Telehealth: Payer: Self-pay | Admitting: Cardiology

## 2021-10-31 MED ORDER — POTASSIUM CHLORIDE CRYS ER 20 MEQ PO TBCR: EXTENDED_RELEASE_TABLET | ORAL | 3 refills | Status: DC

## 2021-10-31 MED ORDER — CLOPIDOGREL BISULFATE 75 MG PO TABS: ORAL_TABLET | ORAL | 3 refills | Status: DC

## 2021-10-31 NOTE — Telephone Encounter (Signed)
Patient called said she really needed to talk with you in regards to her medication

## 2021-10-31 NOTE — Telephone Encounter (Signed)
Spoke to patient. She would like her prescription refilled  Plavix and potassium   RN e-sent medications patient verbalized understanding.

## 2022-01-27 ENCOUNTER — Other Ambulatory Visit: Payer: Self-pay | Admitting: Cardiology

## 2022-02-08 ENCOUNTER — Ambulatory Visit (HOSPITAL_COMMUNITY): Admission: RE | Admit: 2022-02-08 | Payer: Medicare Other | Source: Ambulatory Visit

## 2022-02-08 ENCOUNTER — Telehealth: Payer: Self-pay | Admitting: Cardiovascular Disease

## 2022-02-08 ENCOUNTER — Ambulatory Visit (HOSPITAL_COMMUNITY): Payer: Medicare Other

## 2022-02-08 DIAGNOSIS — Z95828 Presence of other vascular implants and grafts: Secondary | ICD-10-CM

## 2022-02-08 DIAGNOSIS — I1A0 Resistant hypertension: Secondary | ICD-10-CM

## 2022-02-08 DIAGNOSIS — I739 Peripheral vascular disease, unspecified: Secondary | ICD-10-CM

## 2022-02-08 DIAGNOSIS — I701 Atherosclerosis of renal artery: Secondary | ICD-10-CM

## 2022-02-08 DIAGNOSIS — I2510 Atherosclerotic heart disease of native coronary artery without angina pectoris: Secondary | ICD-10-CM

## 2022-02-08 NOTE — Telephone Encounter (Signed)
Patient had to cancel her appt for her Aorta and ABI W/WO TBI study because she was sick.  I wasn't able to reschedule as the order expired today, can the order please be extended so she can get reschedule.  Thank you.

## 2022-02-08 NOTE — Telephone Encounter (Signed)
New orders placed.Message sent back to scheduler

## 2022-02-11 NOTE — Telephone Encounter (Signed)
LMTCB to reschedule test.

## 2022-02-12 NOTE — Telephone Encounter (Signed)
Patient returned call, she is schedule for 12/8 at [facility]

## 2022-03-01 ENCOUNTER — Ambulatory Visit (HOSPITAL_BASED_OUTPATIENT_CLINIC_OR_DEPARTMENT_OTHER)
Admission: RE | Admit: 2022-03-01 | Discharge: 2022-03-01 | Disposition: A | Discharge status: Home / Self Care | Payer: Medicare Other | Source: Ambulatory Visit | Attending: Cardiovascular Disease | Admitting: Cardiovascular Disease

## 2022-03-01 ENCOUNTER — Ambulatory Visit (HOSPITAL_COMMUNITY)
Admission: RE | Admit: 2022-03-01 | Discharge: 2022-03-01 | Disposition: A | Discharge status: Home / Self Care | Payer: Medicare Other | Source: Ambulatory Visit | Attending: Cardiovascular Disease | Admitting: Cardiovascular Disease

## 2022-03-01 DIAGNOSIS — I701 Atherosclerosis of renal artery: Secondary | ICD-10-CM

## 2022-03-01 DIAGNOSIS — I739 Peripheral vascular disease, unspecified: Secondary | ICD-10-CM

## 2022-03-01 DIAGNOSIS — I1A0 Resistant hypertension: Secondary | ICD-10-CM

## 2022-03-01 DIAGNOSIS — I2510 Atherosclerotic heart disease of native coronary artery without angina pectoris: Secondary | ICD-10-CM

## 2022-03-01 DIAGNOSIS — Z95828 Presence of other vascular implants and grafts: Secondary | ICD-10-CM

## 2022-03-12 ENCOUNTER — Encounter: Payer: Self-pay | Admitting: *Deleted

## 2022-03-19 ENCOUNTER — Telehealth: Payer: Self-pay | Admitting: Cardiology

## 2022-03-19 NOTE — Telephone Encounter (Signed)
*  STAT* If patient is at the pharmacy, call can be transferred to refill team.   1. Which medications need to be refilled? (please list name of each medication and dose if known)   albuterol (VENTOLIN HFA) 108 (90 Base) MCG/ACT inhaler    2. Which pharmacy/location (including street and city if local pharmacy) is medication to be sent to? [facility] [phone] - [place], [place] - [address] AT [place]   3. Do they need a 30 day or 90 day supply? [place]

## 2022-04-30 ENCOUNTER — Ambulatory Visit: Payer: Medicare Other | Admitting: Cardiovascular Disease

## 2022-05-07 ENCOUNTER — Encounter: Payer: Self-pay | Admitting: Cardiovascular Disease

## 2022-05-07 ENCOUNTER — Ambulatory Visit: Payer: Medicare HMO | Attending: Cardiovascular Disease | Admitting: Cardiovascular Disease

## 2022-05-07 VITALS — BP 134/82 | HR 64 | Ht 65.0 in | Wt 155.6 lb

## 2022-05-07 DIAGNOSIS — I739 Peripheral vascular disease, unspecified: Secondary | ICD-10-CM

## 2022-05-07 DIAGNOSIS — I701 Atherosclerosis of renal artery: Secondary | ICD-10-CM | POA: Diagnosis not present

## 2022-05-07 DIAGNOSIS — I2510 Atherosclerotic heart disease of native coronary artery without angina pectoris: Secondary | ICD-10-CM | POA: Diagnosis not present

## 2022-05-07 DIAGNOSIS — R0989 Other specified symptoms and signs involving the circulatory and respiratory systems: Secondary | ICD-10-CM

## 2022-05-07 DIAGNOSIS — E785 Hyperlipidemia, unspecified: Secondary | ICD-10-CM

## 2022-05-07 DIAGNOSIS — Z720 Tobacco use: Secondary | ICD-10-CM

## 2022-05-07 NOTE — Progress Notes (Signed)
Cardiology Office Note   Date:  05/07/2022   ID:  Alexandra [name], DOB [birth year], MRN [id number]  PCP:  [facility]  Cardiologist: Dr. Ellyn Hack  No chief complaint on file.     History of Present Illness: Alexandra [name] is a 72 y.o. female who is here today for follow-up visit regarding peripheral arterial disease and renal artery stenosis.    She has known history of coronary artery disease, aortic valve disease status post TAVR, COPD, tobacco use, chronic diastolic heart failure, resistant hypertension and pulmonary hypertension.  She had previous injuries and surgeries to both legs with chronic pain.   She is followed for peripheral arterial disease with known bilateral SFA occlusion and significant disease in the right common iliac artery status post stent placement.  In addition, she is known to have severe bilateral renal artery stenosis with occluded left renal artery and severe stenosis in the right renal artery status post stenting in March 2023.  Follow-up renal artery duplex in April showed patent right renal artery stent. Most recent lower extremity Doppler studies done in December 2023 showed stable ABI overall at 0.66 on the right and 0.76 on the left.  Right common iliac artery stent was patent.  She has been doing reasonably well with no recent chest pain or worsening dyspnea.  She reports stable bilateral calf claudication.  Her blood pressure has been more controlled than before. She does in a frequent urination especially at night.  This has been a chronic issue for her.   Past Medical History:  Diagnosis Date   Arthritis    COPD (chronic obstructive pulmonary disease) ([place]) 05/22/2016   Severely decreased DLCO by PFTs in April 2019 but only mild obstructive disease.    Coronary artery disease, non-occlusive 11/28/2016   R&LHC: 65% distal LAD lesion -- likely not angiographically significant->medical management. Normal LVEF.  Moderately elevated LVEDP. I aortic valve gradient in the Cath Lab, moderate aortic stenosis. This would suggest that the stenosis is on the moderate side of moderate to severe.  Severe pulmonary hypertension (likely mixed).  AVA 0.96 cm; P-P gradient ~ 30 mmHg, Mean ~24.5-27.5 (Moderate AS)    GERD (gastroesophageal reflux disease)    Heart murmur    Hx of adenomatous colonic polyps 10/19/2015   Hyperlipidemia    Hypertension    Resistant hypertension-multiple medications likely partially related to RAS.   Moderate mitral regurgitation by prior echocardiography 10/2016   Echocardiogram revealed moderate mitral regurgitation; most recent Echo 02/12/2019- MAC with moderate MR   Multiple renal cysts 06/2020   Renal A Dopplers: Exophytic Cyst noted in Upper pole of Right Kidney (1.6x1.7x1.7 cm); Exophytic cysts: lower pole of Left Kidney 1.9 x 1.7 x 1.9 cm, and 1.4 x 1.2 x 1.5 cm.   PAOD (peripheral arterial occlusive disease) ([place]) 06/2020   Abd AoGram-BLE Runoff: Severe Bilat RA stenosis - small. atretic L RA. Severe stenosis of R Com Iliac A - Ext Iliac A (PTA-Stent). Bilateral SFA flush CTO. Mod R CFA, L Iliac & CFA. Bilateral SFA reconstitution distal Pop A. R PTA flush CTO 2 V runoff. L LE 3 V runoff unitl distal LPTA CTO.   Small Infrarenal aneurysmal areas with extensive atheroma - no obstructive disease   Pulmonary hypertension associated Aortic Stenosis, Mitral Regurgitation & COPD    Peak PAP by Echo ~54 mmHg;  by Cath PAP/Mean 71/32 mmHg, 53 mmHg (with PCWP & LVEDP ~24-26 mmHg); TPG ~26 mmHg  Pulmonary nodules    Renal artery stenosis, native, bilateral ([place]) 06/2020   Abd Ao Gram: LRA ~ 99% ostial stenosis with small atretic L Kidney (f/u dopplers - Abnormal L kidney size & Resistive Index); R RA ostial 80-90% stenosis (confirmed by doppler Resistive Index, but normal R kidney size.   Renal artery stenosis, native, bilateral ([place]) 06/2020   Abdominal aortic angiogram revealed bilateral  renal artery stenosis, however Dopplers confirmed although the left renal artery was stenosed, the kidney itself was atrophied. => R Renal A Stent 05/2013 ([name])   S/P TAVR (transcatheter aortic valve replacement) 12/30/2017   Edward's Sapien 3 bioprosthetic THB via TF approach   Severe aortic stenosis 2018   Underwent TAVR in 2019   Superior mesenteric artery stenosis ([place]) 06/2020   Abdominal Ao Dopplers - 70-90% Ostial SMA stenosis with normal Celiac A.    Past Surgical History:  Procedure Laterality Date   ABDOMINAL AORTOGRAM W/LOWER EXTREMITY N/A 07/12/2020   Procedure: ABDOMINAL AORTOGRAM W/LOWER EXTREMITY;  Surgeon: Wellington Hampshire, MD;  [facility];; Severe B-RA stenosis - sm/atretic L RA. Severe Dz R Com-Ext Iliac A (PTA-Stent). B-SFA flush CTO. Mod R CFA, L Iliac-CFA. B-SFA recon @ dPop A. R PTA flush CTO & 2 V runoff. L LE 3 V runoff w/ distal LPTA CTO.   Sm InfraRenal aneurysms w/ extensive atheroma - no obstructive Dz   APPENDECTOMY     BREAST CYST EXCISION Right 1970   CARDIAC VALVE REPLACEMENT     CATARACT EXTRACTION W/ INTRAOCULAR LENS IMPLANT Right    CHOLECYSTECTOMY OPEN     COLONOSCOPY  2003, 2017   CTA Chest  09/2016   Thoracic Aortic Ca2+ w/o dilation.  Coronary Calcification noted. PA normal. Mild Emphysematous changes w/ mild LLL scarring.     FRACTURE SURGERY     2007 -right tib /fib fracture ----07/2009 right femur fx after a fall   FRACTURE SURGERY Right 2011   Femur   PERIPHERAL VASCULAR CATHETERIZATION N/A 08/09/2014   Procedure: Aortic Arch Angiography;  Surgeon: Serafina Mitchell, MD;  Location: [place] [facility];  Service: Cardiovascular: Type 1 Arch. No significant stenosis, aneurysmal degeneration or dissection.  No luminal irregularity seen in R or L SubClavian, Brachial or Radial A. Chronic distal ulnar A occlusion [place].   PERIPHERAL VASCULAR CATHETERIZATION Right 08/09/2014   Procedure: Upper Extremity Angiography;  Surgeon:  Serafina Mitchell, MD;  Location: [place] [facility];  Service: Cardiovascular;  Laterality: Right;   PERIPHERAL VASCULAR INTERVENTION  07/12/2020   Procedure: PERIPHERAL VASCULAR INTERVENTION;  Surgeon: Wellington Hampshire, MD;  Location: [place] [facility];  Service: Cardiovascular;;  R Common-External Iliac A PTA & Stent   RENAL ANGIOGRAPHY N/A 06/06/2021   Procedure: RENAL ANGIOGRAPHY;  Surgeon: Wellington Hampshire, MD;  Location: [place] [facility];  Service: Cardiovascular; severe 90% ostial R Renal A stenosis (also noted was severe L Rena A stenosis, but known L kidney atrophy) => Stent R Renal A   RENAL INTERVENTION  06/06/2021   Procedure: RENAL INTERVENTION;  Surgeon: Wellington Hampshire, MD;  Location: [place] [facility];  Service: Cardiovascular;;  Successful stent (Herculink 6.5 mm x 15 mm) placement to the right renal artery via the left radial artery approach.   RIGHT/LEFT HEART CATH AND CORONARY ANGIOGRAPHY N/A 12/06/2016   Procedure: RIGHT/LEFT HEART CATH AND CORONARY ANGIOGRAPHY;  Surgeon: Leonie Man, MD;  [facility]: [facility]: Cor Angio: 65% dLAD (Med Rx).  AVA 0.96 cm; P-P gradient ~ 30 mmHg, Mean ~24.5-27.5 (Mod AS); RHC #s: RAP 8 mmHg, RVP/EDP: 71/8/15 mmHg, PCWP: 21-24 mmHg, PAP/mean: 71/32/52 mmHg = Severe Mixed Pulmonary HTN; LVP/EDP 205/17/26 mmHg ; CO/CI by Fick: 4.35, 2.44 - mildly reduced   RIGHT/LEFT HEART CATH AND CORONARY ANGIOGRAPHY N/A 11/20/2017   Procedure: RIGHT/LEFT HEART CATH AND CORONARY ANGIOGRAPHY;  Surgeon: Larey Dresser, MD;  Location: [place] [facility];  Service: Cardiovascular;  Laterality: N/A;   TEE WITHOUT CARDIOVERSION N/A 12/30/2017   Procedure: (Intra-Op) TRANSESOPHAGEAL ECHOCARDIOGRAM (TEE);  Surgeon: Burnell Blanks, MD;  Location: [place];  Service: Open Heart Surgery; Post TAVR good position of 23 mm AP and 3 valve.  No new or WMA.  EF 55-60%.  No effusion.  Mild perivalvular regurgitation seen.  Mean gradient 7 mmHg.  Peak 16 mmHg.    TOTAL KNEE ARTHROPLASTY Left 12/01/2018   Procedure: Left Knee Arthroplasty;  Surgeon: Melrose Nakayama, MD;  Location: [facility];  Service: Orthopedics;  Laterality: Left;   TRANSCATHETER AORTIC VALVE REPLACEMENT, TRANSFEMORAL N/A 12/30/2017   Procedure: TRANSCATHETER AORTIC VALVE REPLACEMENT, TRANSFEMORAL;  Surgeon: Burnell Blanks, MD;  Location: [place];  Service: Open Heart Surgery;  Laterality: N/A; 23 mm Edwards SAPIEN 3 THV   TRANSTHORACIC ECHOCARDIOGRAM  10/30/2016   Mild Concentric LVH. EF 55-60%. No RWMA, Gr 2 DD. Mod-Severe AS (mean-peak Gradient 31 mmHG - 64 mmHg), Mod MR. Mod LA dilation, Mod PA HTN - peak pressure ~54 mmHg). = Progression of AS from 2014   TRANSTHORACIC ECHOCARDIOGRAM  01/2019   EF 55 to 60%.  Elevated LVEDP.  GR 1 DD.  No R WMA.  Moderate LA dilation.  Well-seated stented bioprosthetic aortic valve.  Mean gradient 13 mercury.  Moderate MAC moderate MR.  No evidence of mildly elevated RAP/CVP and moderately elevated PA P   TRANSTHORACIC ECHOCARDIOGRAM  10/2017   a) Pre-TAVR 10/31/17: EF 55 to 60%.  GR 1 DD.  Calcific severe AS.  Peak Grad 62 mmHg, mean 32 mmHg.  Progression of Dz.  AVA ~0.8 cm.; b) Post TAVR 10/9/'19: EF 60-65%.  GR 1 DD.  Stable appearing 23 mm Edwards's Sapien AV bioprosthesis.  No perivalvular AI.  Mean Grad 22 mmHg; c) 11/13/'19: AoV mean Grad 19 mmHg,, Peak 36 mmHg. PAP ~33 mmHg   TRANSTHORACIC ECHOCARDIOGRAM  08/02/2020   EF 60 to 65%.  GR 1 DD.  Elevated LAP/LVEDP.  Mildly elevated PAP.  Mild LA dilation.  23 mm SAPIEN 3 prosthetic TAVR valve present.  Mild perivalvular leak.  Mean gradient 16 mmHg. Mod MR.   ULTRASOUND GUIDANCE FOR VASCULAR ACCESS  08/09/2014   Procedure: Ultrasound Guidance For Vascular Access;  Surgeon: Serafina Mitchell, MD;  Location: [place] [facility];  Service: Cardiovascular;;     Current Outpatient Medications  Medication Sig Dispense Refill   aspirin 81 MG tablet Take 1 tablet (81 mg total) by mouth 2 (two) times  daily at 10 AM and 5 PM. (Patient taking differently: Take 81 mg by mouth 3 (three) times a week.) 30 tablet 0   bisoprolol-hydrochlorothiazide (ZIAC) 10-6.25 MG tablet Take 1 tablet by mouth daily. 90 tablet 1   clopidogrel (PLAVIX) 75 MG tablet TAKE 1 TABLET(75 MG) BY MOUTH DAILY 90 tablet 3   cyclobenzaprine (FLEXERIL) 10 MG tablet Take 10 mg by mouth at bedtime.     esomeprazole (NEXIUM) 40 MG capsule TAKE 1 CAPSULE BY MOUTH DAILY AT NOON 90 capsule 2   furosemide (LASIX) 20  MG tablet TAKE 1 TABLET BY MOUTH DAILY AS NEEDED. INCREASE SWELLING 30 tablet 6   losartan (COZAAR) 100 MG tablet Take 1 tablet (100 mg total) by mouth daily. 90 tablet 3   oxyCODONE-acetaminophen (PERCOCET) 10-325 MG tablet Take 1 tablet by mouth 5 (five) times daily.     potassium chloride SA (KLOR-CON M) 20 MEQ tablet TAKE 1 TABLET(20 MEQ) BY MOUTH DAILY 90 tablet 3   rosuvastatin (CRESTOR) 20 MG tablet TAKE 1 TABLET(20 MG) BY MOUTH DAILY 90 tablet 3   tiZANidine (ZANAFLEX) 4 MG tablet 1 tablet as needed Orally once at night for 30 days     No current facility-administered medications for this visit.    Allergies:   Codeine and Symproic [naldemedine]    Social History:  The patient  reports that she has been smoking cigarettes. She has a 4.00 pack-year smoking history. She has never used smokeless tobacco. She reports that she does not currently use alcohol. She reports that she does not use drugs.   Family History:  The patient's family history includes Heart attack in her brother; Heart disease in her father and mother; Hypertension in her mother.    ROS:  Please see the history of present illness.   Otherwise, review of systems are positive for none.   All other systems are reviewed and negative.    PHYSICAL EXAM: VS:  BP 134/82   Pulse 64   Ht 5' 5"$  (1.651 m)   Wt 155 lb 9.3 oz (70.6 kg)   SpO2 98%   BMI 25.89 kg/m  , BMI Body mass index is 25.89 kg/m. GEN: Well nourished, well developed, in no acute  distress  HEENT: normal  Neck: no JVD, bilateral carotid bruits, or masses Cardiac: RRR; no  rubs, or gallops, 2 /6 systolic murmur in the aortic area. No leg edema Respiratory:  clear to auscultation bilaterally, normal work of breathing GI: soft, nontender, nondistended, + BS MS: no deformity or atrophy  Skin: warm and dry, no rash Neuro:  Strength and sensation are intact Psych: euthymic mood, full affect Vascular: Radial pulses normal bilaterally.    EKG:  EKG is ordered today. EKG shows normal sinus rhythm, left ventricular hypertrophy with QRS widening.   Recent Labs: 05/31/2021: Hemoglobin 12.2; Platelets 238 10/02/2021: ALT 9; BUN 11; Creatinine, Ser 0.96; Potassium 4.8; Sodium 138; TSH 2.140    Lipid Panel    Component Value Date/Time   CHOL 125 10/02/2021 1023   TRIG 138 10/02/2021 1023   HDL 38 (L) 10/02/2021 1023   CHOLHDL 3.3 10/02/2021 1023   CHOLHDL 3.2 03/05/2018 1135   VLDL 22 03/05/2018 1135   LDLCALC 63 10/02/2021 1023      Wt Readings from Last 3 Encounters:  05/07/22 155 lb 9.3 oz (70.6 kg)  10/22/21 157 lb 12.8 oz (71.6 kg)  10/02/21 158 lb 12.8 oz (72 kg)          11/28/2016   10:08 AM  PAD Screen  Previous PAD dx? No  Previous surgical procedure? No  Pain with walking? Yes  Subsides with rest? Yes  Feet/toe relief with dangling? No  Painful, non-healing ulcers? No  Extremities discolored? No      ASSESSMENT AND PLAN:  1.  Peripheral arterial disease :  Status post self-expanding stent placement to the right iliac artery.  She does have chronically occluded SFAs with collaterals and this will be treated medically for now.  Continue dual antiplatelet therapy.  She has stable moderate  bilateral calf claudication.  Repeat Doppler studies in December of this year.  2.  Bilateral renal artery stenosis: The left renal artery is occluded with small nonviable left kidney.    She is status post recent right renal artery stenting for severe ostial  stenosis.  Her renal function improved and blood pressure is controlled today.  She is due for repeat renal artery duplex which was requested.  3.   Coronary artery disease involving native coronary artery without angina: She seems to be doing well from a cardiac standpoint.  Continue medical therapy.  4.  Status post TAVR: Stable.  5.  Hyperlipidemia: Continue treatment with rosuvastatin.  I reviewed most recent lipid profile which showed an LDL of 63.  6.  Tobacco use: She continues to smoke 2 cigarettes a day.  I again discussed with her the importance of smoking cessation.  7.  Bilateral carotid bruits: I requested carotid Doppler.    Disposition:   FU with me in 6 months  Signed,  [name], MD  05/07/2022 10:36 AM    [place]

## 2022-05-07 NOTE — Patient Instructions (Signed)
Medication Instructions:  No changes *If you need a refill on your cardiac medications before your next appointment, please call your pharmacy*   Lab Work: None ordered If you have labs (blood work) drawn today and your tests are completely normal, you will receive your results only by: [place] (if you have MyChart) OR A paper copy in the mail If you have any lab test that is abnormal or we need to change your treatment, we will call you to review the results.   Testing/Procedures: Your physician has requested that you have a renal artery duplex. During this test, an ultrasound is used to evaluate blood flow to the kidneys. Take your medications as you usually do. This will take place at [place], [address].  No food after 11PM the night before.  Water is OK. (Don't drink liquids if you have been instructed not to for ANOTHER test). Avoid foods that produce bowel gas, for 24 hours prior to exam (see below). No breakfast, no chewing gum, no smoking or carbonated beverages. Patient may take morning medications with water. Come in for test at least 15 minutes early to register.  Your physician has requested that you have a carotid duplex. This test is an ultrasound of the carotid arteries in your neck. It looks at blood flow through these arteries that supply the brain with blood. Allow one hour for this exam. There are no restrictions or special instructions. This will take place at [place], [address].    Follow-Up: At [facility], you and your health needs are our priority.  As part of our continuing mission to provide you with exceptional heart care, we have created designated Provider Care Teams.  These Care Teams include your primary Cardiologist (physician) and Advanced Practice Providers (APPs -  Physician Assistants and Nurse Practitioners) who all work together to provide you with the care you need, when you need it.  We recommend signing up  for the patient portal called "MyChart".  Sign up information is provided on this After Visit Summary.  MyChart is used to connect with patients for Virtual Visits (Telemedicine).  Patients are able to view lab/test results, encounter notes, upcoming appointments, etc.  Non-urgent messages can be sent to your provider as well.   To learn more about what you can do with MyChart, go to [email or web address].    Your next appointment:   12 month(s)  Provider:   [name]

## 2022-05-16 ENCOUNTER — Encounter (HOSPITAL_COMMUNITY): Payer: Medicare HMO

## 2022-05-22 ENCOUNTER — Ambulatory Visit (HOSPITAL_COMMUNITY)
Admission: RE | Admit: 2022-05-22 | Discharge: 2022-05-22 | Disposition: A | Discharge status: Home / Self Care | Payer: Medicare HMO | Source: Ambulatory Visit | Attending: Cardiovascular Disease | Admitting: Cardiovascular Disease

## 2022-05-22 ENCOUNTER — Other Ambulatory Visit: Payer: Self-pay

## 2022-05-22 ENCOUNTER — Ambulatory Visit (HOSPITAL_BASED_OUTPATIENT_CLINIC_OR_DEPARTMENT_OTHER)
Admission: RE | Admit: 2022-05-22 | Discharge: 2022-05-22 | Disposition: A | Discharge status: Home / Self Care | Payer: Medicare HMO | Source: Ambulatory Visit | Attending: Cardiovascular Disease | Admitting: Cardiovascular Disease

## 2022-05-22 DIAGNOSIS — R0989 Other specified symptoms and signs involving the circulatory and respiratory systems: Secondary | ICD-10-CM

## 2022-05-22 DIAGNOSIS — I701 Atherosclerosis of renal artery: Secondary | ICD-10-CM | POA: Insufficient documentation

## 2022-05-23 NOTE — Addendum Note (Signed)
Addended by: Ricci Barker on: 05/23/2022 11:32 AM   Modules accepted: Orders

## 2022-07-21 ENCOUNTER — Other Ambulatory Visit: Payer: Self-pay | Admitting: Cardiology

## 2022-07-21 ENCOUNTER — Other Ambulatory Visit: Payer: Self-pay | Admitting: Cardiovascular Disease

## 2022-07-22 NOTE — Telephone Encounter (Signed)
Refill Request.  

## 2022-08-06 ENCOUNTER — Ambulatory Visit (HOSPITAL_COMMUNITY): Payer: Medicare HMO | Attending: Cardiology

## 2022-08-06 DIAGNOSIS — I5032 Chronic diastolic (congestive) heart failure: Secondary | ICD-10-CM | POA: Diagnosis not present

## 2022-08-06 DIAGNOSIS — I340 Nonrheumatic mitral (valve) insufficiency: Secondary | ICD-10-CM | POA: Diagnosis present

## 2022-08-06 DIAGNOSIS — Z952 Presence of prosthetic heart valve: Secondary | ICD-10-CM | POA: Diagnosis present

## 2022-08-06 LAB — ECHOCARDIOGRAM COMPLETE
AR max vel: 2.04 cm2
AV Area VTI: 2.02 cm2
AV Area mean vel: 1.94 cm2
AV Mean grad: 12 mmHg
AV Peak grad: 21.9 mmHg
Ao pk vel: 2.34 m/s
Calc EF: 38.9 %
Est EF: 55
MV M vel: 6.74 m/s
MV Peak grad: 181.7 mmHg
MV VTI: 3.01 cm2
Radius: 0.7 cm
S' Lateral: 3.9 cm
Single Plane A2C EF: 43.7 %
Single Plane A4C EF: 35.5 %

## 2022-09-05 ENCOUNTER — Other Ambulatory Visit: Payer: Self-pay | Admitting: Cardiovascular Disease

## 2022-09-05 NOTE — Telephone Encounter (Signed)
Refill Request.  

## 2022-09-14 ENCOUNTER — Other Ambulatory Visit: Payer: Self-pay | Admitting: Cardiovascular Disease

## 2022-09-16 NOTE — Telephone Encounter (Signed)
Refill request

## 2022-10-14 ENCOUNTER — Other Ambulatory Visit: Payer: Self-pay | Admitting: Cardiovascular Disease

## 2022-10-15 NOTE — Telephone Encounter (Signed)
Refill request

## 2022-10-22 ENCOUNTER — Other Ambulatory Visit: Payer: Self-pay | Admitting: Cardiovascular Disease

## 2023-01-19 ENCOUNTER — Other Ambulatory Visit: Payer: Self-pay | Admitting: Cardiology

## 2023-04-01 ENCOUNTER — Other Ambulatory Visit: Payer: Self-pay | Admitting: Cardiology

## 2023-04-16 ENCOUNTER — Telehealth (HOSPITAL_COMMUNITY): Payer: Self-pay | Admitting: Cardiology

## 2023-04-23 ENCOUNTER — Other Ambulatory Visit: Payer: Self-pay | Admitting: Cardiovascular Disease

## 2023-04-23 NOTE — Telephone Encounter (Signed)
Refill Request.

## 2023-04-28 ENCOUNTER — Other Ambulatory Visit: Payer: Self-pay | Admitting: Cardiology

## 2023-04-30 ENCOUNTER — Other Ambulatory Visit: Payer: Self-pay | Admitting: Cardiology

## 2023-05-01 ENCOUNTER — Other Ambulatory Visit: Payer: Self-pay | Admitting: *Deleted

## 2023-05-01 DIAGNOSIS — I701 Atherosclerosis of renal artery: Secondary | ICD-10-CM

## 2023-05-01 DIAGNOSIS — R0989 Other specified symptoms and signs involving the circulatory and respiratory systems: Secondary | ICD-10-CM

## 2023-05-23 ENCOUNTER — Other Ambulatory Visit: Payer: Self-pay | Admitting: Cardiovascular Disease

## 2023-05-23 NOTE — Telephone Encounter (Signed)
 Refill Request.

## 2023-06-04 ENCOUNTER — Ambulatory Visit (HOSPITAL_BASED_OUTPATIENT_CLINIC_OR_DEPARTMENT_OTHER)
Admission: RE | Admit: 2023-06-04 | Discharge: 2023-06-04 | Disposition: A | Discharge status: Home / Self Care | Payer: Medicare HMO | Source: Ambulatory Visit | Attending: Cardiovascular Disease | Admitting: Cardiovascular Disease

## 2023-06-04 ENCOUNTER — Ambulatory Visit (HOSPITAL_COMMUNITY)
Admission: RE | Admit: 2023-06-04 | Discharge: 2023-06-04 | Disposition: A | Discharge status: Home / Self Care | Payer: Medicare HMO | Source: Ambulatory Visit | Attending: Cardiovascular Disease | Admitting: Cardiovascular Disease

## 2023-06-04 DIAGNOSIS — S72141A Displaced intertrochanteric fracture of right femur, initial encounter for closed fracture: Secondary | ICD-10-CM | POA: Diagnosis not present

## 2023-06-04 DIAGNOSIS — R0989 Other specified symptoms and signs involving the circulatory and respiratory systems: Secondary | ICD-10-CM | POA: Insufficient documentation

## 2023-06-04 DIAGNOSIS — I2693 Single subsegmental pulmonary embolism without acute cor pulmonale: Secondary | ICD-10-CM | POA: Diagnosis not present

## 2023-06-04 DIAGNOSIS — I701 Atherosclerosis of renal artery: Secondary | ICD-10-CM | POA: Diagnosis not present

## 2023-06-07 ENCOUNTER — Inpatient Hospital Stay (HOSPITAL_COMMUNITY)
Admission: EM | Admit: 2023-06-07 | Discharge: 2023-06-24 | DRG: 208 | Disposition: E | Attending: Internal Medicine | Admitting: Internal Medicine

## 2023-06-07 ENCOUNTER — Other Ambulatory Visit: Payer: Self-pay

## 2023-06-07 ENCOUNTER — Encounter (HOSPITAL_COMMUNITY): Payer: Self-pay

## 2023-06-07 ENCOUNTER — Emergency Department (HOSPITAL_COMMUNITY)

## 2023-06-07 DIAGNOSIS — J449 Chronic obstructive pulmonary disease, unspecified: Secondary | ICD-10-CM | POA: Diagnosis present

## 2023-06-07 DIAGNOSIS — E860 Dehydration: Secondary | ICD-10-CM | POA: Diagnosis present

## 2023-06-07 DIAGNOSIS — Z955 Presence of coronary angioplasty implant and graft: Secondary | ICD-10-CM

## 2023-06-07 DIAGNOSIS — Z8249 Family history of ischemic heart disease and other diseases of the circulatory system: Secondary | ICD-10-CM

## 2023-06-07 DIAGNOSIS — Z7902 Long term (current) use of antithrombotics/antiplatelets: Secondary | ICD-10-CM

## 2023-06-07 DIAGNOSIS — R570 Cardiogenic shock: Secondary | ICD-10-CM | POA: Diagnosis not present

## 2023-06-07 DIAGNOSIS — W19XXXA Unspecified fall, initial encounter: Secondary | ICD-10-CM | POA: Diagnosis present

## 2023-06-07 DIAGNOSIS — I5181 Takotsubo syndrome: Secondary | ICD-10-CM | POA: Diagnosis present

## 2023-06-07 DIAGNOSIS — Z79899 Other long term (current) drug therapy: Secondary | ICD-10-CM

## 2023-06-07 DIAGNOSIS — E874 Mixed disorder of acid-base balance: Secondary | ICD-10-CM | POA: Diagnosis not present

## 2023-06-07 DIAGNOSIS — J9621 Acute and chronic respiratory failure with hypoxia: Secondary | ICD-10-CM | POA: Diagnosis present

## 2023-06-07 DIAGNOSIS — S72141A Displaced intertrochanteric fracture of right femur, initial encounter for closed fracture: Secondary | ICD-10-CM | POA: Diagnosis present

## 2023-06-07 DIAGNOSIS — N1831 Chronic kidney disease, stage 3a: Secondary | ICD-10-CM | POA: Diagnosis present

## 2023-06-07 DIAGNOSIS — K219 Gastro-esophageal reflux disease without esophagitis: Secondary | ICD-10-CM | POA: Diagnosis present

## 2023-06-07 DIAGNOSIS — S72001A Fracture of unspecified part of neck of right femur, initial encounter for closed fracture: Secondary | ICD-10-CM | POA: Diagnosis not present

## 2023-06-07 DIAGNOSIS — I1A0 Resistant hypertension: Secondary | ICD-10-CM | POA: Diagnosis present

## 2023-06-07 DIAGNOSIS — N179 Acute kidney failure, unspecified: Secondary | ICD-10-CM | POA: Diagnosis not present

## 2023-06-07 DIAGNOSIS — Z7951 Long term (current) use of inhaled steroids: Secondary | ICD-10-CM

## 2023-06-07 DIAGNOSIS — Z953 Presence of xenogenic heart valve: Secondary | ICD-10-CM | POA: Diagnosis not present

## 2023-06-07 DIAGNOSIS — I272 Pulmonary hypertension, unspecified: Secondary | ICD-10-CM | POA: Diagnosis present

## 2023-06-07 DIAGNOSIS — Z515 Encounter for palliative care: Secondary | ICD-10-CM | POA: Diagnosis not present

## 2023-06-07 DIAGNOSIS — Y92009 Unspecified place in unspecified non-institutional (private) residence as the place of occurrence of the external cause: Secondary | ICD-10-CM

## 2023-06-07 DIAGNOSIS — R001 Bradycardia, unspecified: Secondary | ICD-10-CM | POA: Diagnosis not present

## 2023-06-07 DIAGNOSIS — Z7982 Long term (current) use of aspirin: Secondary | ICD-10-CM

## 2023-06-07 DIAGNOSIS — J440 Chronic obstructive pulmonary disease with acute lower respiratory infection: Secondary | ICD-10-CM | POA: Diagnosis present

## 2023-06-07 DIAGNOSIS — N170 Acute kidney failure with tubular necrosis: Secondary | ICD-10-CM | POA: Diagnosis present

## 2023-06-07 DIAGNOSIS — E8721 Acute metabolic acidosis: Secondary | ICD-10-CM | POA: Diagnosis not present

## 2023-06-07 DIAGNOSIS — I4891 Unspecified atrial fibrillation: Secondary | ICD-10-CM | POA: Diagnosis not present

## 2023-06-07 DIAGNOSIS — I2693 Single subsegmental pulmonary embolism without acute cor pulmonale: Principal | ICD-10-CM | POA: Diagnosis present

## 2023-06-07 DIAGNOSIS — I5043 Acute on chronic combined systolic (congestive) and diastolic (congestive) heart failure: Secondary | ICD-10-CM | POA: Diagnosis present

## 2023-06-07 DIAGNOSIS — S72009A Fracture of unspecified part of neck of unspecified femur, initial encounter for closed fracture: Principal | ICD-10-CM | POA: Diagnosis present

## 2023-06-07 DIAGNOSIS — E7800 Pure hypercholesterolemia, unspecified: Secondary | ICD-10-CM | POA: Diagnosis present

## 2023-06-07 DIAGNOSIS — J189 Pneumonia, unspecified organism: Secondary | ICD-10-CM | POA: Diagnosis present

## 2023-06-07 DIAGNOSIS — I2699 Other pulmonary embolism without acute cor pulmonale: Secondary | ICD-10-CM | POA: Diagnosis present

## 2023-06-07 DIAGNOSIS — I509 Heart failure, unspecified: Principal | ICD-10-CM

## 2023-06-07 DIAGNOSIS — Z66 Do not resuscitate: Secondary | ICD-10-CM | POA: Diagnosis not present

## 2023-06-07 DIAGNOSIS — I469 Cardiac arrest, cause unspecified: Secondary | ICD-10-CM | POA: Diagnosis present

## 2023-06-07 DIAGNOSIS — N189 Chronic kidney disease, unspecified: Secondary | ICD-10-CM | POA: Diagnosis present

## 2023-06-07 DIAGNOSIS — I2510 Atherosclerotic heart disease of native coronary artery without angina pectoris: Secondary | ICD-10-CM | POA: Diagnosis present

## 2023-06-07 DIAGNOSIS — G894 Chronic pain syndrome: Secondary | ICD-10-CM | POA: Diagnosis present

## 2023-06-07 DIAGNOSIS — I21A1 Myocardial infarction type 2: Secondary | ICD-10-CM | POA: Diagnosis present

## 2023-06-07 DIAGNOSIS — F17200 Nicotine dependence, unspecified, uncomplicated: Secondary | ICD-10-CM | POA: Diagnosis present

## 2023-06-07 DIAGNOSIS — J9601 Acute respiratory failure with hypoxia: Secondary | ICD-10-CM | POA: Diagnosis not present

## 2023-06-07 DIAGNOSIS — I428 Other cardiomyopathies: Secondary | ICD-10-CM | POA: Diagnosis present

## 2023-06-07 DIAGNOSIS — D649 Anemia, unspecified: Secondary | ICD-10-CM | POA: Diagnosis present

## 2023-06-07 DIAGNOSIS — Z1152 Encounter for screening for COVID-19: Secondary | ICD-10-CM | POA: Diagnosis not present

## 2023-06-07 DIAGNOSIS — I5033 Acute on chronic diastolic (congestive) heart failure: Secondary | ICD-10-CM | POA: Diagnosis present

## 2023-06-07 DIAGNOSIS — Z86711 Personal history of pulmonary embolism: Secondary | ICD-10-CM | POA: Diagnosis not present

## 2023-06-07 DIAGNOSIS — Z860101 Personal history of adenomatous and serrated colon polyps: Secondary | ICD-10-CM

## 2023-06-07 DIAGNOSIS — R7989 Other specified abnormal findings of blood chemistry: Secondary | ICD-10-CM | POA: Diagnosis not present

## 2023-06-07 DIAGNOSIS — E785 Hyperlipidemia, unspecified: Secondary | ICD-10-CM | POA: Diagnosis present

## 2023-06-07 DIAGNOSIS — I480 Paroxysmal atrial fibrillation: Secondary | ICD-10-CM | POA: Diagnosis present

## 2023-06-07 DIAGNOSIS — I4719 Other supraventricular tachycardia: Secondary | ICD-10-CM | POA: Diagnosis not present

## 2023-06-07 DIAGNOSIS — I739 Peripheral vascular disease, unspecified: Secondary | ICD-10-CM | POA: Diagnosis present

## 2023-06-07 DIAGNOSIS — I447 Left bundle-branch block, unspecified: Secondary | ICD-10-CM | POA: Diagnosis present

## 2023-06-07 DIAGNOSIS — J90 Pleural effusion, not elsewhere classified: Secondary | ICD-10-CM | POA: Diagnosis not present

## 2023-06-07 DIAGNOSIS — Z781 Physical restraint status: Secondary | ICD-10-CM

## 2023-06-07 DIAGNOSIS — I2602 Saddle embolus of pulmonary artery with acute cor pulmonale: Secondary | ICD-10-CM | POA: Diagnosis not present

## 2023-06-07 DIAGNOSIS — Z952 Presence of prosthetic heart valve: Secondary | ICD-10-CM

## 2023-06-07 DIAGNOSIS — I701 Atherosclerosis of renal artery: Secondary | ICD-10-CM | POA: Diagnosis present

## 2023-06-07 DIAGNOSIS — Z95820 Peripheral vascular angioplasty status with implants and grafts: Secondary | ICD-10-CM

## 2023-06-07 DIAGNOSIS — Z7983 Long term (current) use of bisphosphonates: Secondary | ICD-10-CM

## 2023-06-07 DIAGNOSIS — Z96652 Presence of left artificial knee joint: Secondary | ICD-10-CM | POA: Diagnosis present

## 2023-06-07 DIAGNOSIS — I5021 Acute systolic (congestive) heart failure: Secondary | ICD-10-CM | POA: Diagnosis not present

## 2023-06-07 DIAGNOSIS — F17210 Nicotine dependence, cigarettes, uncomplicated: Secondary | ICD-10-CM | POA: Diagnosis present

## 2023-06-07 LAB — COMPREHENSIVE METABOLIC PANEL
ALT: 117 U/L — ABNORMAL HIGH (ref 0–44)
AST: 179 U/L — ABNORMAL HIGH (ref 15–41)
Albumin: 2.9 g/dL — ABNORMAL LOW (ref 3.5–5.0)
Alkaline Phosphatase: 81 U/L (ref 38–126)
Anion gap: 15 (ref 5–15)
BUN: 41 mg/dL — ABNORMAL HIGH (ref 8–23)
CO2: 17 mmol/L — ABNORMAL LOW (ref 22–32)
Calcium: 7.9 mg/dL — ABNORMAL LOW (ref 8.9–10.3)
Chloride: 107 mmol/L (ref 98–111)
Creatinine, Ser: 1.78 mg/dL — ABNORMAL HIGH (ref 0.44–1.00)
GFR, Estimated: 30 mL/min — ABNORMAL LOW (ref >60)
Glucose, Bld: 95 mg/dL (ref 70–99)
Potassium: 3.9 mmol/L (ref 3.5–5.1)
Sodium: 139 mmol/L (ref 135–145)
Total Bilirubin: 2.9 mg/dL — ABNORMAL HIGH (ref 0.0–1.2)
Total Protein: 5.8 g/dL — ABNORMAL LOW (ref 6.5–8.1)

## 2023-06-07 LAB — URINALYSIS, W/ REFLEX TO CULTURE (INFECTION SUSPECTED)
Bilirubin Urine: NEGATIVE
Glucose, UA: NEGATIVE mg/dL
Ketones, ur: NEGATIVE mg/dL
Nitrite: NEGATIVE
Protein, ur: 100 mg/dL — AB
Specific Gravity, Urine: 1.028 (ref 1.005–1.030)
pH: 5 (ref 5.0–8.0)

## 2023-06-07 LAB — TROPONIN I (HIGH SENSITIVITY)
Troponin I (High Sensitivity): 569 ng/L (ref <18)
Troponin I (High Sensitivity): 510 ng/L (ref <18)

## 2023-06-07 LAB — I-STAT CG4 LACTIC ACID, ED
Lactic Acid, Venous: 2.9 mmol/L (ref 0.5–1.9)
Lactic Acid, Venous: 2 mmol/L (ref 0.5–1.9)

## 2023-06-07 LAB — I-STAT CHEM 8, ED
BUN: 39 mg/dL — ABNORMAL HIGH (ref 8–23)
Calcium, Ion: 0.95 mmol/L — ABNORMAL LOW (ref 1.15–1.40)
Chloride: 109 mmol/L (ref 98–111)
Creatinine, Ser: 1.9 mg/dL — ABNORMAL HIGH (ref 0.44–1.00)
Glucose, Bld: 102 mg/dL — ABNORMAL HIGH (ref 70–99)
HCT: 35 % — ABNORMAL LOW (ref 36.0–46.0)
Hemoglobin: 11.9 g/dL — ABNORMAL LOW (ref 12.0–15.0)
Potassium: 3.9 mmol/L (ref 3.5–5.1)
Sodium: 137 mmol/L (ref 135–145)
TCO2: 19 mmol/L — ABNORMAL LOW (ref 22–32)

## 2023-06-07 LAB — MAGNESIUM: Magnesium: 2.3 mg/dL (ref 1.7–2.4)

## 2023-06-07 LAB — CBC
HCT: 32.9 % — ABNORMAL LOW (ref 36.0–46.0)
Hemoglobin: 9.9 g/dL — ABNORMAL LOW (ref 12.0–15.0)
MCH: 24.4 pg — ABNORMAL LOW (ref 26.0–34.0)
MCHC: 30.1 g/dL (ref 30.0–36.0)
MCV: 81 fL (ref 80.0–100.0)
Platelets: 205 10*3/uL (ref 150–400)
RBC: 4.06 MIL/uL (ref 3.87–5.11)
RDW: 18.6 % — ABNORMAL HIGH (ref 11.5–15.5)
WBC: 15 10*3/uL — ABNORMAL HIGH (ref 4.0–10.5)
nRBC: 0.1 % (ref 0.0–0.2)

## 2023-06-07 LAB — BRAIN NATRIURETIC PEPTIDE: B Natriuretic Peptide: >4500 pg/mL — ABNORMAL HIGH (ref 0.0–100.0)

## 2023-06-07 LAB — RESP PANEL BY RT-PCR (RSV, FLU A&B, COVID)  RVPGX2
Influenza A by PCR: NEGATIVE
Influenza B by PCR: NEGATIVE
Resp Syncytial Virus by PCR: NEGATIVE
SARS Coronavirus 2 by RT PCR: NEGATIVE

## 2023-06-07 LAB — LIPASE, BLOOD: Lipase: 21 U/L (ref 11–51)

## 2023-06-07 MED ORDER — DOCUSATE SODIUM 100 MG PO CAPS
100.0000 mg | ORAL_CAPSULE | Freq: Two times a day (BID) | ORAL | Status: DC
  Administered 2023-06-07 – 2023-06-08 (×2): 100 mg via ORAL
  Filled 2023-06-07: qty 1

## 2023-06-07 MED ORDER — BISOPROLOL FUMARATE 10 MG PO TABS
10.0000 mg | ORAL_TABLET | Freq: Every day | ORAL | Status: DC
  Administered 2023-06-08: 10 mg via ORAL
  Filled 2023-06-07: qty 1

## 2023-06-07 MED ORDER — HEPARIN (PORCINE) 25000 UT/250ML-% IV SOLN
1100.0000 [IU]/h | INTRAVENOUS | Status: DC
  Administered 2023-06-07: 1200 [IU]/h via INTRAVENOUS
  Administered 2023-06-08 (×2): 1100 [IU]/h via INTRAVENOUS
  Filled 2023-06-07 (×3): qty 250

## 2023-06-07 MED ORDER — ENSURE ENLIVE PO LIQD
237.0000 mL | Freq: Two times a day (BID) | ORAL | Status: DC
  Administered 2023-06-08 (×2): 237 mL via ORAL

## 2023-06-07 MED ORDER — HYDROMORPHONE HCL 1 MG/ML IJ SOLN
0.5000 mg | INTRAMUSCULAR | Status: DC | PRN
  Administered 2023-06-07 – 2023-06-08 (×4): 0.5 mg via INTRAVENOUS
  Filled 2023-06-07 (×4): qty 0.5

## 2023-06-07 MED ORDER — SODIUM CHLORIDE 0.9 % IV SOLN
500.0000 mg | INTRAVENOUS | Status: DC
  Administered 2023-06-07 – 2023-06-08 (×2): 500 mg via INTRAVENOUS
  Filled 2023-06-07 (×2): qty 5

## 2023-06-07 MED ORDER — FUROSEMIDE 10 MG/ML IJ SOLN
80.0000 mg | Freq: Once | INTRAMUSCULAR | Status: AC
  Administered 2023-06-07: 80 mg via INTRAVENOUS
  Filled 2023-06-07: qty 8

## 2023-06-07 MED ORDER — IOHEXOL 350 MG/ML SOLN
75.0000 mL | Freq: Once | INTRAVENOUS | Status: AC | PRN
  Administered 2023-06-07: 75 mL via INTRAVENOUS

## 2023-06-07 MED ORDER — ROSUVASTATIN CALCIUM 20 MG PO TABS
20.0000 mg | ORAL_TABLET | Freq: Every day | ORAL | Status: DC
  Administered 2023-06-08: 20 mg via ORAL
  Filled 2023-06-07: qty 1

## 2023-06-07 MED ORDER — OXYCODONE HCL 5 MG PO TABS
10.0000 mg | ORAL_TABLET | ORAL | Status: DC
  Administered 2023-06-07 – 2023-06-08 (×6): 10 mg via ORAL
  Filled 2023-06-07 (×7): qty 2

## 2023-06-07 MED ORDER — METHOCARBAMOL 1000 MG/10ML IJ SOLN
500.0000 mg | Freq: Four times a day (QID) | INTRAMUSCULAR | Status: DC | PRN
  Administered 2023-06-08: 500 mg via INTRAVENOUS
  Filled 2023-06-07: qty 10

## 2023-06-07 MED ORDER — HEPARIN BOLUS VIA INFUSION
4000.0000 [IU] | Freq: Once | INTRAVENOUS | Status: AC
  Administered 2023-06-07: 4000 [IU] via INTRAVENOUS
  Filled 2023-06-07: qty 4000

## 2023-06-07 MED ORDER — METHOCARBAMOL 500 MG PO TABS
500.0000 mg | ORAL_TABLET | Freq: Four times a day (QID) | ORAL | Status: DC | PRN
  Administered 2023-06-07: 500 mg via ORAL
  Filled 2023-06-07: qty 1

## 2023-06-07 MED ORDER — ONDANSETRON HCL 4 MG/2ML IJ SOLN: 4.0000 mg | Freq: Four times a day (QID) | INTRAMUSCULAR | Status: DC | PRN

## 2023-06-07 MED ORDER — SODIUM CHLORIDE 0.9 % IV SOLN
1.0000 g | Freq: Once | INTRAVENOUS | Status: AC
  Administered 2023-06-07: 1 g via INTRAVENOUS
  Filled 2023-06-07: qty 10

## 2023-06-07 MED ORDER — BISACODYL 5 MG PO TBEC
5.0000 mg | DELAYED_RELEASE_TABLET | Freq: Every day | ORAL | Status: DC | PRN
  Administered 2023-06-08: 5 mg via ORAL
  Filled 2023-06-07: qty 1

## 2023-06-07 MED ORDER — ACETAMINOPHEN 325 MG PO TABS: 650.0000 mg | ORAL_TABLET | Freq: Four times a day (QID) | ORAL | Status: DC | PRN

## 2023-06-07 MED ORDER — POLYETHYLENE GLYCOL 3350 17 G PO PACK: 17.0000 g | PACK | Freq: Every day | ORAL | Status: DC | PRN

## 2023-06-07 MED ORDER — SODIUM CHLORIDE 0.9 % IV SOLN
1.0000 g | INTRAVENOUS | Status: DC
  Administered 2023-06-08: 1 g via INTRAVENOUS
  Filled 2023-06-07: qty 10

## 2023-06-07 MED ORDER — PANTOPRAZOLE SODIUM 40 MG PO TBEC
40.0000 mg | DELAYED_RELEASE_TABLET | Freq: Every day | ORAL | Status: DC
  Administered 2023-06-08: 40 mg via ORAL
  Filled 2023-06-07: qty 1

## 2023-06-07 MED ORDER — HYDRALAZINE HCL 20 MG/ML IJ SOLN: 5.0000 mg | INTRAMUSCULAR | Status: DC | PRN

## 2023-06-07 NOTE — ED Notes (Signed)
 Patient transported to CT

## 2023-06-07 NOTE — H&P (Signed)
 History and Physical    Patient: Alexandra [name] ZOX:[id number] DOB: 1951-03-17 DOA: 06/07/2023 DOS: the patient was seen and examined on 06/07/2023 PCP: [facility]  Patient coming from: Home - lives with husband; NOKGayla Doss, [place], [phone]   Chief Complaint: SOB  HPI: Alexandra [name] is a 73 y.o. female with medical history significant of COPD, CAD, HTN, HLD, PAD, pulmonary HTN, B renal artery stenosis, and s/p TAVR who presented on 3/15 with SOB and R leg pain.  She was noted to be 88% on RA and started on supplemental O2.  History primarily obtained from son and DIL due to severity of patient illness.  She has been having SOB and increased WOB for 1-3 weeks with recurrent dry cough.  She had an appointment at cardiology on 3/12 and they were hoping to see the doctor but the appointment was just for vascular studies.  She has had decreased mobility for some time (several years) but still mostly walks without assistance and performs all ADLs.  They live in an older home with lots of varying floor heights and she tripped over a step on 3/13 and fell.  Her husband was able to get her to the couch but she has been unable to get up since (and refused to come in for evaluation until today).  +LE edema for weeks.  SOB worsened and she was 88% on RA with EMS.      ER Course:  Worsening cough, SOB.  Not on home O2.  88% on RA.  Fell on R hip 3 days ago.  +AKI.  WBC 15.  Lactate 2.9.  Troponin 569, BNP >4500.  R hip fracture.  Consulted ortho Mining engineer).  B PE without R heart strain.  RLL PNA.  Starting antibiotics and Heparin.  Given Lasix.       Review of Systems: As mentioned in the history of present illness. All other systems reviewed and are negative.  Past Medical History:  Diagnosis Date   Arthritis    COPD (chronic obstructive pulmonary disease) (HCC) 05/22/2016   Severely decreased DLCO by PFTs in April 2019 but only mild obstructive  disease.    Coronary artery disease, non-occlusive 11/28/2016   R&LHC: 65% distal LAD lesion -- likely not angiographically significant->medical management. Normal LVEF. Moderately elevated LVEDP. I aortic valve gradient in the Cath Lab, moderate aortic stenosis. This would suggest that the stenosis is on the moderate side of moderate to severe.  Severe pulmonary hypertension (likely mixed).  AVA 0.96 cm; P-P gradient ~ 30 mmHg, Mean ~24.5-27.5 (Moderate AS)    GERD (gastroesophageal reflux disease)    Heart murmur    Hx of adenomatous colonic polyps 10/19/2015   Hyperlipidemia    Hypertension    Resistant hypertension-multiple medications likely partially related to RAS.   Moderate mitral regurgitation by prior echocardiography 10/2016   Echocardiogram revealed moderate mitral regurgitation; most recent Echo 02/12/2019- MAC with moderate MR   Multiple renal cysts 06/2020   Renal A Dopplers: Exophytic Cyst noted in Upper pole of Right Kidney (1.6x1.7x1.7 cm); Exophytic cysts: lower pole of Left Kidney 1.9 x 1.7 x 1.9 cm, and 1.4 x 1.2 x 1.5 cm.   PAOD (peripheral arterial occlusive disease) (HCC) 06/2020   Abd AoGram-BLE Runoff: Severe Bilat RA stenosis - small. atretic L RA. Severe stenosis of R Com Iliac A - Ext Iliac A (PTA-Stent). Bilateral SFA flush CTO. Mod R CFA, L Iliac & CFA. Bilateral SFA reconstitution  distal Pop A. R PTA flush CTO 2 V runoff. L LE 3 V runoff unitl distal LPTA CTO.   Small Infrarenal aneurysmal areas with extensive atheroma - no obstructive disease   Pulmonary hypertension associated Aortic Stenosis, Mitral Regurgitation & COPD    Peak PAP by Echo ~54 mmHg;  by Cath PAP/Mean 71/32 mmHg, 53 mmHg (with PCWP & LVEDP ~24-26 mmHg); TPG ~26 mmHg   Pulmonary nodules    Renal artery stenosis, native, bilateral (HCC) 06/2020   Abd Ao Gram: LRA ~ 99% ostial stenosis with small atretic L Kidney (f/u dopplers - Abnormal L kidney size & Resistive Index); R RA ostial 80-90% stenosis  (confirmed by doppler Resistive Index, but normal R kidney size.   Renal artery stenosis, native, bilateral (HCC) 06/2020   Abdominal aortic angiogram revealed bilateral renal artery stenosis, however Dopplers confirmed although the left renal artery was stenosed, the kidney itself was atrophied. => R Renal A Stent 05/2013 (Dr. Kirke Corin)   S/P TAVR (transcatheter aortic valve replacement) 12/30/2017   Edward's Sapien 3 bioprosthetic THB via TF approach   Severe aortic stenosis 2018   Underwent TAVR in 2019   Superior mesenteric artery stenosis (HCC) 06/2020   Abdominal Ao Dopplers - 70-90% Ostial SMA stenosis with normal Celiac A.   Past Surgical History:  Procedure Laterality Date   ABDOMINAL AORTOGRAM W/LOWER EXTREMITY N/A 07/12/2020   Procedure: ABDOMINAL AORTOGRAM W/LOWER EXTREMITY;  Surgeon: Iran Ouch, MD;  Location: [facility] INVASIVE CV LAB;; Severe B-RA stenosis - sm/atretic L RA. Severe Dz R Com-Ext Iliac A (PTA-Stent). B-SFA flush CTO. Mod R CFA, L Iliac-CFA. B-SFA recon @ dPop A. R PTA flush CTO & 2 V runoff. L LE 3 V runoff w/ distal LPTA CTO.   Sm InfraRenal aneurysms w/ extensive atheroma - no obstructive Dz   APPENDECTOMY     BREAST CYST EXCISION Right 1970   CARDIAC VALVE REPLACEMENT     CATARACT EXTRACTION W/ INTRAOCULAR LENS IMPLANT Right    CHOLECYSTECTOMY OPEN     COLONOSCOPY  2003, 2017   CTA Chest  09/2016   Thoracic Aortic Ca2+ w/o dilation.  Coronary Calcification noted. PA normal. Mild Emphysematous changes w/ mild LLL scarring.     FRACTURE SURGERY     2007 -right tib /fib fracture ----07/2009 right femur fx after a fall   FRACTURE SURGERY Right 2011   Femur   PERIPHERAL VASCULAR CATHETERIZATION N/A 08/09/2014   Procedure: Aortic Arch Angiography;  Surgeon: Nada Libman, MD;  Location: [facility] INVASIVE CV LAB;  Service: Cardiovascular: Type 1 Arch. No significant stenosis, aneurysmal degeneration or dissection.  No luminal irregularity seen in R or L SubClavian,  Brachial or Radial A. Chronic distal ulnar A occlusion Bilatera.   PERIPHERAL VASCULAR CATHETERIZATION Right 08/09/2014   Procedure: Upper Extremity Angiography;  Surgeon: Nada Libman, MD;  Location: [facility] INVASIVE CV LAB;  Service: Cardiovascular;  Laterality: Right;   PERIPHERAL VASCULAR INTERVENTION  07/12/2020   Procedure: PERIPHERAL VASCULAR INTERVENTION;  Surgeon: Iran Ouch, MD;  Location: [facility] INVASIVE CV LAB;  Service: Cardiovascular;;  R Common-External Iliac A PTA & Stent   RENAL ANGIOGRAPHY N/A 06/06/2021   Procedure: RENAL ANGIOGRAPHY;  Surgeon: Iran Ouch, MD;  Location: [facility] INVASIVE CV LAB;  Service: Cardiovascular; severe 90% ostial R Renal A stenosis (also noted was severe L Rena A stenosis, but known L kidney atrophy) => Stent R Renal A   RENAL INTERVENTION  06/06/2021   Procedure: RENAL INTERVENTION;  Surgeon: Kirke Corin,  Chelsea Aus, MD;  Location: [facility] INVASIVE CV LAB;  Service: Cardiovascular;;  Successful stent (Herculink 6.5 mm x 15 mm) placement to the right renal artery via the left radial artery approach.   RIGHT/LEFT HEART CATH AND CORONARY ANGIOGRAPHY N/A 12/06/2016   Procedure: RIGHT/LEFT HEART CATH AND CORONARY ANGIOGRAPHY;  Surgeon: Marykay Lex, MD;  Location: [facility] INVASIVE CV LAB: Cor Angio: 65% dLAD (Med Rx). AVA 0.96 cm; P-P gradient ~ 30 mmHg, Mean ~24.5-27.5 (Mod AS); RHC #s: RAP 8 mmHg, RVP/EDP: 71/8/15 mmHg, PCWP: 21-24 mmHg, PAP/mean: 71/32/52 mmHg = Severe Mixed Pulmonary HTN; LVP/EDP 205/17/26 mmHg ; CO/CI by Fick: 4.35, 2.44 - mildly reduced   RIGHT/LEFT HEART CATH AND CORONARY ANGIOGRAPHY N/A 11/20/2017   Procedure: RIGHT/LEFT HEART CATH AND CORONARY ANGIOGRAPHY;  Surgeon: Laurey Morale, MD;  Location: [facility] INVASIVE CV LAB;  Service: Cardiovascular;  Laterality: N/A;   TEE WITHOUT CARDIOVERSION N/A 12/30/2017   Procedure: (Intra-Op) TRANSESOPHAGEAL ECHOCARDIOGRAM (TEE);  Surgeon: Kathleene Hazel, MD;  Location: [facility] OR;  Service: Open Heart  Surgery; Post TAVR good position of 23 mm AP and 3 valve.  No new or WMA.  EF 55-60%.  No effusion.  Mild perivalvular regurgitation seen.  Mean gradient 7 mmHg.  Peak 16 mmHg.   TOTAL KNEE ARTHROPLASTY Left 12/01/2018   Procedure: Left Knee Arthroplasty;  Surgeon: Marcene Corning, MD;  Location: [facility] ORS;  Service: Orthopedics;  Laterality: Left;   TRANSCATHETER AORTIC VALVE REPLACEMENT, TRANSFEMORAL N/A 12/30/2017   Procedure: TRANSCATHETER AORTIC VALVE REPLACEMENT, TRANSFEMORAL;  Surgeon: Kathleene Hazel, MD;  Location: [facility] OR;  Service: Open Heart Surgery;  Laterality: N/A; 23 mm Edwards SAPIEN 3 THV   TRANSTHORACIC ECHOCARDIOGRAM  10/30/2016   Mild Concentric LVH. EF 55-60%. No RWMA, Gr 2 DD. Mod-Severe AS (mean-peak Gradient 31 mmHG - 64 mmHg), Mod MR. Mod LA dilation, Mod PA HTN - peak pressure ~54 mmHg). = Progression of AS from 2014   TRANSTHORACIC ECHOCARDIOGRAM  01/2019   EF 55 to 60%.  Elevated LVEDP.  GR 1 DD.  No R WMA.  Moderate LA dilation.  Well-seated stented bioprosthetic aortic valve.  Mean gradient 13 mercury.  Moderate MAC moderate MR.  No evidence of mildly elevated RAP/CVP and moderately elevated PA P   TRANSTHORACIC ECHOCARDIOGRAM  10/2017   a) Pre-TAVR 10/31/17: EF 55 to 60%.  GR 1 DD.  Calcific severe AS.  Peak Grad 62 mmHg, mean 32 mmHg.  Progression of Dz.  AVA ~0.8 cm.; b) Post TAVR 10/9/'19: EF 60-65%.  GR 1 DD.  Stable appearing 23 mm Edwards's Sapien AV bioprosthesis.  No perivalvular AI.  Mean Grad 22 mmHg; c) 11/13/'19: AoV mean Grad 19 mmHg,, Peak 36 mmHg. PAP ~33 mmHg   TRANSTHORACIC ECHOCARDIOGRAM  08/02/2020   EF 60 to 65%.  GR 1 DD.  Elevated LAP/LVEDP.  Mildly elevated PAP.  Mild LA dilation.  23 mm SAPIEN 3 prosthetic TAVR valve present.  Mild perivalvular leak.  Mean gradient 16 mmHg. Mod MR.   ULTRASOUND GUIDANCE FOR VASCULAR ACCESS  08/09/2014   Procedure: Ultrasound Guidance For Vascular Access;  Surgeon: Nada Libman, MD;  Location: [facility] INVASIVE CV  LAB;  Service: Cardiovascular;;   Social History:  reports that she has been smoking cigarettes. She has a 4 pack-year smoking history. She has never used smokeless tobacco. She reports that she does not currently use alcohol. She reports that she does not use drugs.  Allergies  Allergen Reactions   Codeine Nausea Only  Symproic [Naldemedine] Nausea And Vomiting    Family History  Problem Relation Age of Onset   Hypertension Mother    Heart disease Mother        She is unaware of the details   Heart disease Father        Unaware of details   Heart attack Brother    Colon cancer Neg Hx    Colon polyps Neg Hx    Esophageal cancer Neg Hx    Rectal cancer Neg Hx    Stomach cancer Neg Hx     Prior to Admission medications   Medication Sig Start Date End Date Taking? Authorizing Provider  aspirin 81 MG tablet Take 1 tablet (81 mg total) by mouth 2 (two) times daily at 10 AM and 5 PM. Patient taking differently: Take 81 mg by mouth 3 (three) times a week. 12/02/18   Elodia Florence, PA-C  bisoprolol-hydrochlorothiazide (ZIAC) 10-6.25 MG tablet TAKE 1 TABLET BY MOUTH DAILY 05/23/23   Iran Ouch, MD  clopidogrel (PLAVIX) 75 MG tablet TAKE 1 TABLET(75 MG) BY MOUTH DAILY 04/23/23   Iran Ouch, MD  cyclobenzaprine (FLEXERIL) 10 MG tablet Take 10 mg by mouth at bedtime. 04/27/21   [provider]  esomeprazole (NEXIUM) 40 MG capsule TAKE 1 CAPSULE BY MOUTH DAILY AT NOON 07/22/22   Marykay Lex, MD  furosemide (LASIX) 20 MG tablet TAKE 1 TABLET BY MOUTH DAILY AS NEEDED. INCREASE SWELLING 01/28/22   Marykay Lex, MD  losartan (COZAAR) 100 MG tablet TAKE 1 TABLET(100 MG) BY MOUTH DAILY 10/15/22   Iran Ouch, MD  oxyCODONE-acetaminophen (PERCOCET) 10-325 MG tablet Take 1 tablet by mouth 5 (five) times daily. 10/16/19   [provider]  potassium chloride SA (KLOR-CON M) 20 MEQ tablet Take 1 tablet (20 mEq total) by mouth daily. KEEP OV. 04/30/23   Marykay Lex,  MD  rosuvastatin (CRESTOR) 20 MG tablet TAKE 1 TABLET(20 MG) BY MOUTH DAILY 09/16/22   Iran Ouch, MD  tiZANidine (ZANAFLEX) 4 MG tablet 1 tablet as needed Orally once at night for 30 days 08/13/21   [provider]    Physical Exam: Vitals:   06/07/23 1830 06/07/23 1836 06/07/23 1900 06/07/23 1915  BP: (!) 140/119  137/88   Pulse: (!) 105  (!) 104 (!) 106  Resp: (!) 39  (!) 41 (!) 38  Temp:  (!) 96.6 F (35.9 C)    TempSrc:  Axillary    SpO2: 100%  99% 98%  Weight:      Height:       General:  Appears ill; hip pain with movement Eyes:  PERRLA, EOMI, normal lids, iris ENT: hard of hearing,  grossly normal lips & tongue, mmm; artificial dentition Neck:  no LAD, masses or thyromegaly Cardiovascular:  RRR with mild tachycardia. 3+ pitting LE edema.  Respiratory:   Diffuse rhonchi.  Mildly to moderately increased respiratory effort.   Abdomen:  soft, NT, ND Skin:  no rash or induration seen on limited exam Musculoskeletal:  R leg is shortened and externally rotated Psychiatric:  blunted mood and affect, speech sparse but appropriate Neurologic:  CN 2-12 grossly intact, moves all extremities in coordinated fashion other than RLE   Radiological Exams on Admission: Independently reviewed - see discussion in A/P where applicable  CT Angio Chest PE W/Cm &/Or Wo Cm Result Date: 06/07/2023 CLINICAL DATA:  Concern for pulmonary embolism. Short of breath. RIGHT leg pain. Productive cough EXAM: CT ANGIOGRAPHY  CHEST CT ABDOMEN AND PELVIS WITH CONTRAST TECHNIQUE: Multidetector CT imaging of the chest was performed using the standard protocol during bolus administration of intravenous contrast. Multiplanar CT image reconstructions and MIPs were obtained to evaluate the vascular anatomy. Multidetector CT imaging of the abdomen and pelvis was performed using the standard protocol during bolus administration of intravenous contrast. RADIATION DOSE REDUCTION: This exam was performed  according to the departmental dose-optimization program which includes automated exposure control, adjustment of the mA and/or kV according to patient size and/or use of iterative reconstruction technique. CONTRAST:  75mL OMNIPAQUE IOHEXOL 350 MG/ML SOLN COMPARISON:  None Available. FINDINGS: CTA CHEST FINDINGS Cardiovascular: There is filling defect within subsegmental branches of the RIGHT lower lobe (image 81 through series 4). These distal segmental filling defects are occlusive. Probable filling defect within the medial branch of the LEFT lobe pulmonary artery (image 89/series 4. The overall clot burden is mild. No evidence of RIGHT ventricular strain. Upper lung lobes are clear. Mediastinum/Nodes: No axillary or supraclavicular adenopathy. No mediastinal or hilar adenopathy. No pericardial fluid. Esophagus normal. Lungs/Pleura: There is consolidation in the posterior RIGHT lower lobe. There is a moderate size RIGHT effusion. The volume of consolidation appears larger thanexpected from a pulmonary infarction. Favor pneumonia. Mild consolidation atelectasis in the LEFT lower lobe. Small LEFT effusion. Musculoskeletal: No aggressive osseous lesion. Review of the MIP images confirms the above findings. CT ABDOMEN and PELVIS FINDINGS Hepatobiliary: No focal hepatic lesion. Postcholecystectomy. No biliary dilatation. Pancreas: Pancreas is normal. No ductal dilatation. No pancreatic inflammation. Spleen: Normal spleen Adrenals/urinary tract: Adrenal glands normal. The LEFT kidney is atrophic. Small calcification in the RIGHT kidney. Bilateral low-density renal cysts. Ureters and bladder normal. Stomach/Bowel: The stomach, duodenum, and small bowel normal. Multiple diverticula of the descending colon and sigmoid colon without acute inflammation. Vascular/Lymphatic: Abdominal aorta is normal caliber with atherosclerotic calcification. There is no retroperitoneal or periportal lymphadenopathy. No pelvic lymphadenopathy.  Reproductive: Uterus and adnexa unremarkable. Other: No free fluid. Musculoskeletal: No aggressive osseous lesion. Review of the MIP images confirms the above findings. IMPRESSION: CHEST: 1. Bilateral lower lobe pulmonary emboli. Mild clot burden. No evidence of RIGHT ventricular strain. 2. Consolidation in the RIGHT lower lobe is favored pneumonia. 3. Moderate size RIGHT effusion. Small LEFT effusion. PELVIS: 1. No acute findings in the abdomen pelvis. 2. Atrophic LEFT kidney. 3.  Aortic Atherosclerosis (ICD10-I70.0). Electronically Signed   By: Genevive Bi M.D.   On: 06/07/2023 17:43   CT ABDOMEN PELVIS W CONTRAST Result Date: 06/07/2023 CLINICAL DATA:  Concern for pulmonary embolism. Short of breath. RIGHT leg pain. Productive cough EXAM: CT ANGIOGRAPHY CHEST CT ABDOMEN AND PELVIS WITH CONTRAST TECHNIQUE: Multidetector CT imaging of the chest was performed using the standard protocol during bolus administration of intravenous contrast. Multiplanar CT image reconstructions and MIPs were obtained to evaluate the vascular anatomy. Multidetector CT imaging of the abdomen and pelvis was performed using the standard protocol during bolus administration of intravenous contrast. RADIATION DOSE REDUCTION: This exam was performed according to the departmental dose-optimization program which includes automated exposure control, adjustment of the mA and/or kV according to patient size and/or use of iterative reconstruction technique. CONTRAST:  75mL OMNIPAQUE IOHEXOL 350 MG/ML SOLN COMPARISON:  None Available. FINDINGS: CTA CHEST FINDINGS Cardiovascular: There is filling defect within subsegmental branches of the RIGHT lower lobe (image 81 through series 4). These distal segmental filling defects are occlusive. Probable filling defect within the medial branch of the LEFT lobe pulmonary artery (image 89/series 4. The overall  clot burden is mild. No evidence of RIGHT ventricular strain. Upper lung lobes are clear.  Mediastinum/Nodes: No axillary or supraclavicular adenopathy. No mediastinal or hilar adenopathy. No pericardial fluid. Esophagus normal. Lungs/Pleura: There is consolidation in the posterior RIGHT lower lobe. There is a moderate size RIGHT effusion. The volume of consolidation appears larger thanexpected from a pulmonary infarction. Favor pneumonia. Mild consolidation atelectasis in the LEFT lower lobe. Small LEFT effusion. Musculoskeletal: No aggressive osseous lesion. Review of the MIP images confirms the above findings. CT ABDOMEN and PELVIS FINDINGS Hepatobiliary: No focal hepatic lesion. Postcholecystectomy. No biliary dilatation. Pancreas: Pancreas is normal. No ductal dilatation. No pancreatic inflammation. Spleen: Normal spleen Adrenals/urinary tract: Adrenal glands normal. The LEFT kidney is atrophic. Small calcification in the RIGHT kidney. Bilateral low-density renal cysts. Ureters and bladder normal. Stomach/Bowel: The stomach, duodenum, and small bowel normal. Multiple diverticula of the descending colon and sigmoid colon without acute inflammation. Vascular/Lymphatic: Abdominal aorta is normal caliber with atherosclerotic calcification. There is no retroperitoneal or periportal lymphadenopathy. No pelvic lymphadenopathy. Reproductive: Uterus and adnexa unremarkable. Other: No free fluid. Musculoskeletal: No aggressive osseous lesion. Review of the MIP images confirms the above findings. IMPRESSION: CHEST: 1. Bilateral lower lobe pulmonary emboli. Mild clot burden. No evidence of RIGHT ventricular strain. 2. Consolidation in the RIGHT lower lobe is favored pneumonia. 3. Moderate size RIGHT effusion. Small LEFT effusion. PELVIS: 1. No acute findings in the abdomen pelvis. 2. Atrophic LEFT kidney. 3.  Aortic Atherosclerosis (ICD10-I70.0). Electronically Signed   By: Genevive Bi M.D.   On: 06/07/2023 17:43   DG Knee 2 Views Right Result Date: 06/07/2023 CLINICAL DATA:  Larey Seat, right leg pain EXAM:  RIGHT KNEE - 1-2 VIEW COMPARISON:  07/07/2019 FINDINGS: Frontal and lateral views of the right knee are obtained. Partial visualization of intramedullary rod within the femur and tibia. No evidence of acute fracture, subluxation, or dislocation. There is mild osteoarthritis of the medial and patellofemoral compartments. No joint effusion. Mild diffuse subcutaneous edema. IMPRESSION: 1. Mild osteoarthritis. 2. No acute fracture. 3. Mild diffuse subcutaneous edema. Electronically Signed   By: Sharlet Salina M.D.   On: 06/07/2023 15:17   DG Hip Unilat W or Wo Pelvis 2-3 Views Right Result Date: 06/07/2023 CLINICAL DATA:  Larey Seat, right leg pain EXAM: DG HIP (WITH OR WITHOUT PELVIS) 2-3V RIGHT COMPARISON:  08/20/2009 FINDINGS: Frontal view of the pelvis as well as frontal and cross-table lateral views of the right hip are obtained. There is a comminuted intertrochanteric right hip fracture, with impaction and varus angulation at the fracture site. No dislocation. Mild symmetrical bilateral hip osteoarthritis. The remainder of the bony pelvis is unremarkable. IMPRESSION: 1. Comminuted intertrochanteric right hip fracture, with impaction and varus angulation. Electronically Signed   By: Sharlet Salina M.D.   On: 06/07/2023 15:14   DG Chest 1 View Result Date: 06/07/2023 CLINICAL DATA:  Short of breath, right leg pain, fell 3 days ago, productive cough EXAM: CHEST  1 VIEW COMPARISON:  11/26/2018 FINDINGS: Single frontal view of the chest demonstrates marked enlargement of the cardiac silhouette. Aortic valve prosthesis is noted. There is increased pulmonary vascular congestion. Veiling opacities at the lung bases, right greater than left, consistent with consolidation and/or effusions. No pneumothorax. No acute bony abnormalities. IMPRESSION: 1. Marked enlargement of the cardiac silhouette. 2. Pulmonary vascular congestion, with bibasilar veiling opacities consistent with consolidation and/or effusions. Findings could  reflect sequela of congestive heart failure. Electronically Signed   By: Sharlet Salina M.D.   On: 06/07/2023  15:13    EKG: Independently reviewed.  Sinus tachycardia with rate 112; prolonged QTc 516; LBBB with bigeminy    Labs on Admission: I have personally reviewed the available labs and imaging studies at the time of the admission.  Pertinent labs:    CO2 17 BN 41/Creatinine 1.78/GFR 30; 10/0.93/65 on 02/26/23 Albumin 2.9 AST 179/ALT 117/Bili 2.9; normal on 02/26/23 BNP >4500 HS troponin 569, 510 Lactate 2.9, 2.0 WBC 15 Hgb 9.9; 10.7 on 02/26/23 COVID/flu/RSV negative Blood cultures pending   Assessment and Plan: Principal Problem:   Hip fracture (HCC) Active Problems:   Resistant hypertension   Hyperlipidemia with target low density lipoprotein (LDL) cholesterol less than 70 mg/dL   Chronic pain syndrome   COPD (chronic obstructive pulmonary disease) (HCC)   Smoker   Coronary artery disease, non-occlusive   GERD (gastroesophageal reflux disease)   S/P TAVR (transcatheter aortic valve replacement)   PAD (peripheral artery disease) (HCC)   Acute pulmonary embolism (HCC)   Acute on chronic diastolic CHF (congestive heart failure) (HCC)   Right lower lobe pneumonia   Acute kidney injury superimposed on chronic kidney disease (HCC)     Hip fracture Apparently mechanical fall resulting in hip fracture Comminuted intertrochanteric right hip fracture, with impaction and varus angulation on imaging Orthopedics consulted Hip fracture repair will be delayed in the setting of acute PE She will be placed in Buck's traction for now Last dose of Plavix was 3/14 AM  Heparin drip (see below) Pain control with Tylenol, Robaxin, Oxycodone, and Dilaudid prn TOC team consult for rehab placement (prefers [facility]) Will need PT consult post-operatively Hip fracture order set utilized TXA per orthopedics Fascia iliacus block ordered per anesthesia  Pre-operative  stratification Orthopedic/spinal surgery is associated with an intermediate (1-5%) cardiovascular risk for cardiac death and nonfatal MI With her CHF and CAD, her revised cardiac index gives a risk estimate of 10.1% Because of this risk, she is recommended to have pre-operative EKG testing prior to surgery; this was done in the ER Her Detsky's Modified Cardiac Risk Index score is 25, with a 20% cardiac risk She will need cardiology clearance prior to going to the OR  Acute B PE Patient without prior episodes of thromboembolic disease presenting with new BLL PE Patient is not showing evidence of hemodynamic instability at this time R heart strain was not seen on CT Initiate anticoagulation - for now, will start treatment-dose heparin  [place] O2 as needed Smoking cessation has been strongly encouraged DVT US ordered; an IVC filter might need to be considered - particularly if the patient is unable to tolerate OAC therapy   Acute on chronic diastolic CHF Patient with known h/o chronic diastolic CHF presenting with worsening SOB and hypoxia 07/2022 echo with preserved EF, mild RA dilation indicating diastolic dysfunction with moderate MR and mild-moderate MS; s/p TAVR and aortic valve is stable CXR consistent with mild pulmonary edema Markedly elevated BNP Will request echocardiogram CHF order set utilized Was given Lasix 80 mg x 1 in ER and will hold for now and closely monitor (unless cardiology feels differently) Elevated HS troponin is likely related to demand ischemia; doubt ACS based on symptoms and repeat troponin is flat  RLL PNA Dry cough, SOB Chest CT read as probable RLL PNA She has other reasons for SOB including CHF exacerbation and B PE However, for now will continue Rocephin and Azithromycin with plan for 5 days of treatment Also with moderate R pleural effusion, possibly related to infection but  equally likely from CHF; will monitor with diuresis  AKI on stage 3a CKD Patient  with mild baseline compromised renal function, likely stage 2-3a based on brief review of records She does have an atrophic L kidney with apparent loss of blood flow (chronically) Current creatinine is >0.3 within 48 hours or increased at least 1.5 times compared to baseline and presumed to have occurred within the last 7 days There is likely a prerenal component secondary to dehydration in the setting of poor PO intake since her fall 2 days ago However, she is frankly volume overloaded and so diminished renal perfusion in the setting of volume overload is equally likely if not more She was given 1 dose of 80 mg IV Lasix in the ER Will hold additional Lasix as well as IVF and recheck BMP in AM Avoid ACEI and NSAIDs  If worsening, she may need nephrology consultation  Vasculopathy/CAD/PVD Patient has significant underlying vascular disease requiring multiple prior interventions She has carotid US at cardiology on 3/12 with mild R ICA stenosis and moderate (40-59%) L ICA stenosis with L retrograde flow and disruption of B subclavian artery flow Also with renal artery US showing occlusion and atrophy as well as a possibly avascular septated cystic mass at the lower pole of the left kidney All of these studies will need follow up soon but are apparently unrelated to presenting complaints Holding ASA and Plavix for now  HTN Continue bisoprolol Hold hydrochlorothiazide, losartan Will also add prn hydralazine  HLD Continue rosuvastatin  GERD Continue PPI  Chronic pain I have reviewed this patient in the [place] Controlled Substances Reporting System.  She is receiving medications from only one provider and appears to be taking them as prescribed. She is not at particularly high risk of opioid misuse, diversion, or overdose.  Continue oxycodone (not Percocet) 10 mg 5 times daily Add prn Dilaudid 0.5 mg q2h prn breakthrough pain (plus Narcan) Hold at bedtime cyclobenzaprine  Tobacco  dependence Encourage cessation.   This was discussed with the family and should be reviewed on an ongoing basis - although family reports that she will "never" quit smoking Patch ordered     Advance Care Planning:   Code Status: Full Code - Code status was discussed with the patient and/or family at the time of admission.  The patient would want to receive full resuscitative measures at this time.   Consults: Orthopedics; Cardiology  DVT Prophylaxis: Heparin drip  Family Communication: I spoke at length with her son and DIL at the bedside at the time of admission  Severity of Illness: The appropriate patient status for this patient is INPATIENT. Inpatient status is judged to be reasonable and necessary in order to provide the required intensity of service to ensure the patient's safety. The patient's presenting symptoms, physical exam findings, and initial radiographic and laboratory data in the context of their chronic comorbidities is felt to place them at high risk for further clinical deterioration. Furthermore, it is not anticipated that the patient will be medically stable for discharge from the hospital within 2 midnights of admission.   * I certify that at the point of admission it is my clinical judgment that the patient will require inpatient hospital care spanning beyond 2 midnights from the point of admission due to high intensity of service, high risk for further deterioration and high frequency of surveillance required.*  Author: Jonah Blue, MD 06/07/2023 7:51 PM  For on call review [email or web address].

## 2023-06-07 NOTE — ED Provider Notes (Signed)
 [place] EMERGENCY DEPARTMENT AT [facility] Provider Note   CSN: [id number] Arrival date & time: 06/07/23  1348     History  Chief Complaint  Patient presents with   Shortness of Breath   Leg Pain    Alexandra [name] is a 73 y.o. female with PMHx PAD, SMA stenosis, CAD, COPD, OS, GERD, HLD, HTN, Pulmonary HTN, CHF, paroxysmal atrial tachycardia who presents to ED concerned for cough and SOB worsening over the past 5 days. Patient stating that she had a coughing fit 3 days ago which lead to a fall and is now experiencing right hip and right knee pain. Patient endorses compliance on Plavix. Ptaient hypoxic on RA at 88% when EMS arrived. Patient NOT on chronic O2.  Denies fever, chest pain, nausea, vomiting, diarrhea, dysuria, hematuria, hematochezia.   Shortness of Breath Leg Pain      Home Medications Prior to Admission medications   Medication Sig Start Date End Date Taking? Authorizing Provider  aspirin 81 MG tablet Take 1 tablet (81 mg total) by mouth 2 (two) times daily at 10 AM and 5 PM. Patient taking differently: Take 81 mg by mouth 3 (three) times a week. 12/02/18   Elodia Florence, PA-C  bisoprolol-hydrochlorothiazide (ZIAC) 10-6.25 MG tablet TAKE 1 TABLET BY MOUTH DAILY 05/23/23   Iran Ouch, MD  clopidogrel (PLAVIX) 75 MG tablet TAKE 1 TABLET(75 MG) BY MOUTH DAILY 04/23/23   Iran Ouch, MD  cyclobenzaprine (FLEXERIL) 10 MG tablet Take 10 mg by mouth at bedtime. 04/27/21   [provider]  esomeprazole (NEXIUM) 40 MG capsule TAKE 1 CAPSULE BY MOUTH DAILY AT NOON 07/22/22   Marykay Lex, MD  furosemide (LASIX) 20 MG tablet TAKE 1 TABLET BY MOUTH DAILY AS NEEDED. INCREASE SWELLING 01/28/22   Marykay Lex, MD  losartan (COZAAR) 100 MG tablet TAKE 1 TABLET(100 MG) BY MOUTH DAILY 10/15/22   Iran Ouch, MD  oxyCODONE-acetaminophen (PERCOCET) 10-325 MG tablet Take 1 tablet by mouth 5 (five) times daily. 10/16/19   [provider]   potassium chloride SA (KLOR-CON M) 20 MEQ tablet Take 1 tablet (20 mEq total) by mouth daily. KEEP OV. 04/30/23   Marykay Lex, MD  rosuvastatin (CRESTOR) 20 MG tablet TAKE 1 TABLET(20 MG) BY MOUTH DAILY 09/16/22   Iran Ouch, MD  tiZANidine (ZANAFLEX) 4 MG tablet 1 tablet as needed Orally once at night for 30 days 08/13/21   [provider]      Allergies    Codeine and Symproic [naldemedine]    Review of Systems   Review of Systems  Respiratory:  Positive for shortness of breath.     Physical Exam Updated Vital Signs BP 132/89   Pulse (!) 109   Resp 20   Ht 5\' 5"  (1.651 m)   Wt 65.8 kg   BMI 24.13 kg/m  Physical Exam Vitals and nursing note reviewed.  Constitutional:      General: She is not in acute distress.    Appearance: She is not ill-appearing or toxic-appearing.  HENT:     Head: Normocephalic and atraumatic.     Mouth/Throat:     Mouth: Mucous membranes are moist.     Pharynx: No posterior oropharyngeal erythema.  Eyes:     General: No scleral icterus.       Right eye: No discharge.        Left eye: No discharge.     Conjunctiva/sclera: Conjunctivae normal.  Cardiovascular:  Rate and Rhythm: Regular rhythm. Tachycardia present.     Pulses: Normal pulses.     Heart sounds: No murmur heard. Pulmonary:     Effort: Pulmonary effort is normal. No respiratory distress.     Breath sounds: Rhonchi present. No wheezing or rales.     Comments: Mild rhonchi diffusely Chest:     Comments: No tenderness to palpation Abdominal:     General: Bowel sounds are normal.     Palpations: Abdomen is soft. There is no mass.     Tenderness: There is abdominal tenderness.     Comments: Mild generalized tenderness to palpation  Musculoskeletal:     Comments: No tenderness to palpation of BL shoulders, elbows, wrists, ankles.  No tenderness of left hip, left knee.  Tenderness to palpation is present of right hip and right knee. +2 pitting edema of BL LE.     Skin:    General: Skin is warm and dry.     Findings: No rash.  Neurological:     General: No focal deficit present.     Mental Status: She is alert and oriented to person, place, and time. Mental status is at baseline.  Psychiatric:        Mood and Affect: Mood normal.     ED Results / Procedures / Treatments   Labs (all labs ordered are listed, but only abnormal results are displayed) Labs Reviewed - No data to display  EKG None  Radiology No results found.  Procedures Procedures    Medications Ordered in ED Medications - No data to display  ED Course/ Medical Decision Making/ A&P Clinical Course as of 06/07/23 1502  Sat Jun 07, 2023  1458 HF, COPD, PAD, pulm htn,  - worsening cough and SOB, 3 day ago fit w/ fall, no chronic o2,  [HG]    Clinical Course User Index [HG] Renella Cunas, MD                                 Medical Decision Making Amount and/or Complexity of Data Reviewed Labs: ordered. Radiology: ordered.   This patient presents to the ED for concern of shortness of breath, this involves an extensive number of treatment options, and is a complaint that carries with it a high risk of complications and morbidity.  The differential diagnosis includes Anxiety, Anaphylaxis/Angioedema, Aspirated FB, Arrhythmia, CHF, Asthma, COPD, PNA, COVID/Flu/RSV, STEMI, Tamponade, TPNX, Sepsis   Co morbidities that complicate the patient evaluation  PAD, SMA stenosis, CAD, COPD, OS, GERD, HLD, HTN, Pulmonary HTN, CHF, paroxysmal atrial tachycardia   Additional history obtained:  Dr. Kirke Corin Outpatient Cardiologist   Problem List / ED Course / Critical interventions / Medication management  Patient presents to ED concerned for worsening SOB and cough x5 days. Denies chest pain. Endorses falling during a coughing fit 3 days ago and now has right hip and knee pain. Physical exam also showing generalized tenderness to palpation and diffuse mild rhonchi throughout  lungs. Patient also hypoxic at 88% on RA when EMS arrived. Patient currently on O2 mask in ED with sats around 91%.  I Ordered, and personally interpreted labs.  CBC with leukocytosis at 15.0. there is also anemia with hgb at 9.9. rest of labs pending. I ordered CT abdomen/pelvis given patient's abdominal pain on physical exam.  Also added on CTA for hypoxia and tachycardia.  Also ordered x-rays of patient's hip, knee, and chest to assess  for process contributing to her symptoms.  These imaging results are pending. I have reviewed the patients home medicines and have made adjustments as needed.  Social Determinants of Health:  geriatric   3PM Care of Alexandra [name] transferred to Dr. Myriam Jacobson at the end of my shift as the patient will require reassessment once labs/imaging have resulted. Patient presentation, ED course, and plan of care discussed with review of all pertinent labs and imaging. Please see his/her note for further details regarding further ED course and disposition. Plan at time of handoff is reassess patient after workup. Patient will probably need admission. This may be altered or completely changed at the discretion of the oncoming team pending results of further workup.         Final Clinical Impression(s) / ED Diagnoses Final diagnoses:  None    Rx / DC Orders ED Discharge Orders     None         Dorthy Cooler, [place] 06/07/23 1503    Wynetta Fines, MD 06/08/23 1006

## 2023-06-07 NOTE — ED Provider Notes (Signed)
  Physical Exam  BP (!) 126/112 (BP Location: Left Arm)   Pulse (!) 106   Temp 97.9 F (36.6 C) (Axillary)   Resp (!) 36   Ht 5\' 5"  (1.651 m)   Wt 62.7 kg   SpO2 98%   BMI 23.00 kg/m   Physical Exam  Procedures  Procedures  ED Course / MDM   Clinical Course as of 06/07/23 2001  Sat Jun 07, 2023  1458 HF, COPD, PAD, pulm htn,  - worsening cough and SOB, 3 day ago fit w/ fall, no chronic o2 but was 88% on room air - hypoxia, AKI [ ]  f/u labs [ ]  scans b/c abd pain and for chest [HG]    Clinical Course User Index [HG] Renella Cunas, MD   Medical Decision Making Amount and/or Complexity of Data Reviewed Labs: ordered. Radiology: ordered.  Risk Prescription drug management. Decision regarding hospitalization.   At the time of handoff, following up labs and CT scans.  Please see prior ED providers note for HPI and physical exam.  Labs notable for leukocytosis 15.0, AKI, transaminitis, elevated bilirubin, lactic acid elevated at 2.9, elevated troponin 569 with 2-hour reflex of 510, and elevated BNPof >4.5k concerning for heart failure exacerbation.  80 mg IV Lasix administered as patient admits she has not been taking the PO medication because it makes her urinate and she does not like to have to get up.  Query whether this is contributing to heart failure exacerbation.  Foley placed for management given need for diuresis with hip fracture as below.  Imaging is notable for bilateral lower lobe pulmonary emboli with no evidence of right heart strain.  Patient initiated on heparin.  Imaging is also notable for right lower lobe consolidation concerning for pneumonia.  Patient initiated on Rocephin and azithromycin.  Plain films notable for right comminuted intertrochanteric hip fracture.  Initially, Ortho, Dr. Christell Constant consulted, however patient follows with [facility] who I contacted for recommendations. Dr. Turner Daniels returned my call and set PA will see the patient in the  morning and write a consult note.  OR resolution will be delayed given patient's pulmonary embolisms, however at this time, only recommendation is Buck's traction.  I have communicated this with inpatient attending Dr. Ophelia Charter as patient is off the floor by the time recommendations returned.  Renella Cunas, PGY-2 Emergency Medicine     Renella Cunas, MD 06/07/23 Layne Benton, MD 06/08/23 [phone]

## 2023-06-07 NOTE — ED Notes (Signed)
 Changed pulse ox cord

## 2023-06-07 NOTE — ED Notes (Signed)
 Patient transported to 3 E with RN and monitor

## 2023-06-07 NOTE — ED Triage Notes (Signed)
 Pt BIB [facility] from home - SOB and R leg pain - pt had fall on Plavix 3 days ago. Pt did not hit head or LOC. Productive cough past 3 days.  Per fire 88% RA 6L simple mask - difficult read due to cold fingers Entitle 20 110 HR 40 R 130/60 bolus (18G L AC)  HX CHF COPD CAD HTN

## 2023-06-07 NOTE — ED Notes (Signed)
 Patient transported to X-ray

## 2023-06-07 NOTE — Progress Notes (Signed)
 ANTICOAGULATION CONSULT NOTE  Pharmacy Consult for Heparin Indication: pulmonary embolus  Allergies  Allergen Reactions   Codeine Nausea Only   Symproic [Naldemedine] Nausea And Vomiting    Patient Measurements: Height: 5\' 5"  (165.1 cm) Weight: 65.8 kg (145 lb) IBW/kg (Calculated) : 57 Heparin Dosing Weight: 65.8 kg  Vital Signs: Temp: 95.6 F (35.3 C) (03/15 1411) Temp Source: Rectal (03/15 1411) BP: 138/118 (03/15 1700) Pulse Rate: 107 (03/15 1700)  Labs: Recent Labs    06/07/23 1415 06/07/23 1428  HGB 9.9* 11.9*  HCT 32.9* 35.0*  PLT 205  --   CREATININE 1.78* 1.90*  TROPONINIHS 569*  --     Estimated Creatinine Clearance: 23.4 mL/min (A) (by C-G formula based on SCr of 1.9 mg/dL (H)).   Medical History: Past Medical History:  Diagnosis Date   Arthritis    COPD (chronic obstructive pulmonary disease) (HCC) 05/22/2016   Severely decreased DLCO by PFTs in April 2019 but only mild obstructive disease.    Coronary artery disease, non-occlusive 11/28/2016   R&LHC: 65% distal LAD lesion -- likely not angiographically significant->medical management. Normal LVEF. Moderately elevated LVEDP. I aortic valve gradient in the Cath Lab, moderate aortic stenosis. This would suggest that the stenosis is on the moderate side of moderate to severe.  Severe pulmonary hypertension (likely mixed).  AVA 0.96 cm; P-P gradient ~ 30 mmHg, Mean ~24.5-27.5 (Moderate AS)    GERD (gastroesophageal reflux disease)    Heart murmur    Hx of adenomatous colonic polyps 10/19/2015   Hyperlipidemia    Hypertension    Resistant hypertension-multiple medications likely partially related to RAS.   Moderate mitral regurgitation by prior echocardiography 10/2016   Echocardiogram revealed moderate mitral regurgitation; most recent Echo 02/12/2019- MAC with moderate MR   Multiple renal cysts 06/2020   Renal A Dopplers: Exophytic Cyst noted in Upper pole of Right Kidney (1.6x1.7x1.7 cm); Exophytic  cysts: lower pole of Left Kidney 1.9 x 1.7 x 1.9 cm, and 1.4 x 1.2 x 1.5 cm.   PAOD (peripheral arterial occlusive disease) (HCC) 06/2020   Abd AoGram-BLE Runoff: Severe Bilat RA stenosis - small. atretic L RA. Severe stenosis of R Com Iliac A - Ext Iliac A (PTA-Stent). Bilateral SFA flush CTO. Mod R CFA, L Iliac & CFA. Bilateral SFA reconstitution distal Pop A. R PTA flush CTO 2 V runoff. L LE 3 V runoff unitl distal LPTA CTO.   Small Infrarenal aneurysmal areas with extensive atheroma - no obstructive disease   Pulmonary hypertension associated Aortic Stenosis, Mitral Regurgitation & COPD    Peak PAP by Echo ~54 mmHg;  by Cath PAP/Mean 71/32 mmHg, 53 mmHg (with PCWP & LVEDP ~24-26 mmHg); TPG ~26 mmHg   Pulmonary nodules    Renal artery stenosis, native, bilateral (HCC) 06/2020   Abd Ao Gram: LRA ~ 99% ostial stenosis with small atretic L Kidney (f/u dopplers - Abnormal L kidney size & Resistive Index); R RA ostial 80-90% stenosis (confirmed by doppler Resistive Index, but normal R kidney size.   Renal artery stenosis, native, bilateral (HCC) 06/2020   Abdominal aortic angiogram revealed bilateral renal artery stenosis, however Dopplers confirmed although the left renal artery was stenosed, the kidney itself was atrophied. => R Renal A Stent 05/2013 (Dr. 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    Medications:  (Not in a hospital admission)  Scheduled:   furosemide  80 mg Intravenous Once   Infusions:   azithromycin     cefTRIAXone (ROCEPHIN)  IV     PRN:   Assessment: 37 yof with a history of PAD, SMA stenosis, CAD, COPD, OS, GERD, HLD, HTN, Pulmonary HTN, CHF, paroxysmal atrial tachycardia . Patient is presenting with SOB  and leg pain. Heparin per pharmacy consult placed for pulmonary embolus.  CTA w/ bilateral PE without evidence for RHS  Patient is not on anticoagulation prior to arrival.  Hgb 11.9; plt 205  Goal of Therapy:  Heparin level 0.3-0.7 units/ml Monitor platelets by anticoagulation protocol: Yes   Plan:  Give IV heparin 4000 units bolus x 1 Start heparin infusion at 1200 units/hr Check anti-Xa level in 8 hours and daily while on heparin Continue to monitor H&H and platelets  Delmar Landau, PharmD, BCPS 06/07/2023 6:15 PM ED Clinical Pharmacist -  [phone]

## 2023-06-08 ENCOUNTER — Inpatient Hospital Stay (HOSPITAL_COMMUNITY)

## 2023-06-08 DIAGNOSIS — R7989 Other specified abnormal findings of blood chemistry: Secondary | ICD-10-CM | POA: Diagnosis not present

## 2023-06-08 DIAGNOSIS — I2699 Other pulmonary embolism without acute cor pulmonale: Secondary | ICD-10-CM | POA: Diagnosis not present

## 2023-06-08 DIAGNOSIS — I2693 Single subsegmental pulmonary embolism without acute cor pulmonale: Secondary | ICD-10-CM | POA: Diagnosis not present

## 2023-06-08 DIAGNOSIS — I469 Cardiac arrest, cause unspecified: Secondary | ICD-10-CM | POA: Diagnosis not present

## 2023-06-08 DIAGNOSIS — Z86711 Personal history of pulmonary embolism: Secondary | ICD-10-CM | POA: Diagnosis not present

## 2023-06-08 DIAGNOSIS — J90 Pleural effusion, not elsewhere classified: Secondary | ICD-10-CM

## 2023-06-08 DIAGNOSIS — I2602 Saddle embolus of pulmonary artery with acute cor pulmonale: Secondary | ICD-10-CM

## 2023-06-08 DIAGNOSIS — Z66 Do not resuscitate: Secondary | ICD-10-CM | POA: Diagnosis not present

## 2023-06-08 DIAGNOSIS — I4719 Other supraventricular tachycardia: Secondary | ICD-10-CM

## 2023-06-08 DIAGNOSIS — I4891 Unspecified atrial fibrillation: Secondary | ICD-10-CM

## 2023-06-08 DIAGNOSIS — E8721 Acute metabolic acidosis: Secondary | ICD-10-CM

## 2023-06-08 DIAGNOSIS — S72001A Fracture of unspecified part of neck of right femur, initial encounter for closed fracture: Secondary | ICD-10-CM | POA: Diagnosis not present

## 2023-06-08 DIAGNOSIS — Z1152 Encounter for screening for COVID-19: Secondary | ICD-10-CM | POA: Diagnosis not present

## 2023-06-08 DIAGNOSIS — J189 Pneumonia, unspecified organism: Secondary | ICD-10-CM

## 2023-06-08 DIAGNOSIS — Z515 Encounter for palliative care: Secondary | ICD-10-CM | POA: Diagnosis not present

## 2023-06-08 LAB — ECHOCARDIOGRAM COMPLETE
AR max vel: 1.52 cm2
AV Area VTI: 1.46 cm2
AV Area mean vel: 1.6 cm2
AV Mean grad: 15 mmHg
AV Peak grad: 31.4 mmHg
Ao pk vel: 2.8 m/s
Calc EF: 37.4 %
Height: 65 in
MV M vel: 5.02 m/s
MV Peak grad: 100.8 mmHg
Radius: 0.6 cm
S' Lateral: 3.4 cm
Single Plane A2C EF: 44.9 %
Single Plane A4C EF: 31.6 %
Weight: 2211.65 [oz_av]

## 2023-06-08 LAB — POCT I-STAT 7, (LYTES, BLD GAS, ICA,H+H)
Acid-base deficit: 11 mmol/L — ABNORMAL HIGH (ref 0.0–2.0)
Bicarbonate: 16 mmol/L — ABNORMAL LOW (ref 20.0–28.0)
Calcium, Ion: 1.17 mmol/L (ref 1.15–1.40)
HCT: 30 % — ABNORMAL LOW (ref 36.0–46.0)
Hemoglobin: 10.2 g/dL — ABNORMAL LOW (ref 12.0–15.0)
O2 Saturation: 100 %
Potassium: 4.5 mmol/L (ref 3.5–5.1)
Sodium: 140 mmol/L (ref 135–145)
TCO2: 17 mmol/L — ABNORMAL LOW (ref 22–32)
pCO2 arterial: 39.1 mmHg (ref 32–48)
pH, Arterial: 7.221 — ABNORMAL LOW (ref 7.35–7.45)
pO2, Arterial: 204 mmHg — ABNORMAL HIGH (ref 83–108)

## 2023-06-08 LAB — CBC WITH DIFFERENTIAL/PLATELET
Abs Immature Granulocytes: 0.16 10*3/uL — ABNORMAL HIGH (ref 0.00–0.07)
Basophils Absolute: 0 10*3/uL (ref 0.0–0.1)
Basophils Relative: 0 %
Eosinophils Absolute: 0 10*3/uL (ref 0.0–0.5)
Eosinophils Relative: 0 %
HCT: 32.7 % — ABNORMAL LOW (ref 36.0–46.0)
Hemoglobin: 9.9 g/dL — ABNORMAL LOW (ref 12.0–15.0)
Immature Granulocytes: 1 %
Lymphocytes Relative: 6 %
Lymphs Abs: 1.2 10*3/uL (ref 0.7–4.0)
MCH: 24.3 pg — ABNORMAL LOW (ref 26.0–34.0)
MCHC: 30.3 g/dL (ref 30.0–36.0)
MCV: 80.1 fL (ref 80.0–100.0)
Monocytes Absolute: 1.6 10*3/uL — ABNORMAL HIGH (ref 0.1–1.0)
Monocytes Relative: 8 %
Neutro Abs: 16.9 10*3/uL — ABNORMAL HIGH (ref 1.7–7.7)
Neutrophils Relative %: 85 %
Platelets: 180 10*3/uL (ref 150–400)
RBC: 4.08 MIL/uL (ref 3.87–5.11)
RDW: 18.7 % — ABNORMAL HIGH (ref 11.5–15.5)
WBC: 19.9 10*3/uL — ABNORMAL HIGH (ref 4.0–10.5)
nRBC: 0.1 % (ref 0.0–0.2)

## 2023-06-08 LAB — CBC
HCT: 33.4 % — ABNORMAL LOW (ref 36.0–46.0)
Hemoglobin: 9.3 g/dL — ABNORMAL LOW (ref 12.0–15.0)
MCH: 24 pg — ABNORMAL LOW (ref 26.0–34.0)
MCHC: 27.8 g/dL — ABNORMAL LOW (ref 30.0–36.0)
MCV: 86.3 fL (ref 80.0–100.0)
Platelets: 147 10*3/uL — ABNORMAL LOW (ref 150–400)
RBC: 3.87 MIL/uL (ref 3.87–5.11)
RDW: 18.9 % — ABNORMAL HIGH (ref 11.5–15.5)
WBC: 16.3 10*3/uL — ABNORMAL HIGH (ref 4.0–10.5)
nRBC: 1.4 % — ABNORMAL HIGH (ref 0.0–0.2)

## 2023-06-08 LAB — BASIC METABOLIC PANEL
Anion gap: 15 (ref 5–15)
Anion gap: 19 — ABNORMAL HIGH (ref 5–15)
BUN: 44 mg/dL — ABNORMAL HIGH (ref 8–23)
BUN: 49 mg/dL — ABNORMAL HIGH (ref 8–23)
CO2: 20 mmol/L — ABNORMAL LOW (ref 22–32)
CO2: 18 mmol/L — ABNORMAL LOW (ref 22–32)
Calcium: 7.4 mg/dL — ABNORMAL LOW (ref 8.9–10.3)
Calcium: 8 mg/dL — ABNORMAL LOW (ref 8.9–10.3)
Chloride: 105 mmol/L (ref 98–111)
Chloride: 104 mmol/L (ref 98–111)
Creatinine, Ser: 1.7 mg/dL — ABNORMAL HIGH (ref 0.44–1.00)
Creatinine, Ser: 2.06 mg/dL — ABNORMAL HIGH (ref 0.44–1.00)
GFR, Estimated: 31 mL/min — ABNORMAL LOW (ref >60)
GFR, Estimated: 25 mL/min — ABNORMAL LOW (ref >60)
Glucose, Bld: 69 mg/dL — ABNORMAL LOW (ref 70–99)
Glucose, Bld: 109 mg/dL — ABNORMAL HIGH (ref 70–99)
Potassium: 3.4 mmol/L — ABNORMAL LOW (ref 3.5–5.1)
Potassium: 4.5 mmol/L (ref 3.5–5.1)
Sodium: 140 mmol/L (ref 135–145)
Sodium: 141 mmol/L (ref 135–145)

## 2023-06-08 LAB — GLUCOSE, CAPILLARY
Glucose-Capillary: 85 mg/dL (ref 70–99)
Glucose-Capillary: 119 mg/dL — ABNORMAL HIGH (ref 70–99)

## 2023-06-08 LAB — TROPONIN I (HIGH SENSITIVITY)
Troponin I (High Sensitivity): 613 ng/L (ref <18)
Troponin I (High Sensitivity): 526 ng/L (ref <18)

## 2023-06-08 LAB — LACTIC ACID, PLASMA: Lactic Acid, Venous: 8.8 mmol/L (ref 0.5–1.9)

## 2023-06-08 LAB — HEPARIN LEVEL (UNFRACTIONATED)
Heparin Unfractionated: 0.79 [IU]/mL — ABNORMAL HIGH (ref 0.30–0.70)
Heparin Unfractionated: 0.52 [IU]/mL (ref 0.30–0.70)
Heparin Unfractionated: 0.28 [IU]/mL — ABNORMAL LOW (ref 0.30–0.70)

## 2023-06-08 MED ORDER — NOREPINEPHRINE 4 MG/250ML-% IV SOLN
0.0000 ug/min | INTRAVENOUS | Status: DC
  Administered 2023-06-09: 18 ug/min via INTRAVENOUS
  Administered 2023-06-09: 20 ug/min via INTRAVENOUS
  Administered 2023-06-09: 16 ug/min via INTRAVENOUS
  Filled 2023-06-08 (×4): qty 250

## 2023-06-08 MED ORDER — SODIUM CHLORIDE 0.9 % IV SOLN
250.0000 mL | INTRAVENOUS | Status: DC
  Administered 2023-06-09: 250 mL via INTRAVENOUS

## 2023-06-08 MED ORDER — POLYETHYLENE GLYCOL 3350 17 G PO PACK: 17.0000 g | PACK | Freq: Every day | ORAL | Status: DC

## 2023-06-08 MED ORDER — SODIUM BICARBONATE 8.4 % IV SOLN
INTRAVENOUS | Status: AC
Start: 2023-06-08 — End: 2023-06-08
  Administered 2023-06-08: 50 meq via INTRAVENOUS
  Filled 2023-06-08: qty 50

## 2023-06-08 MED ORDER — ADULT MULTIVITAMIN W/MINERALS CH
1.0000 | ORAL_TABLET | Freq: Every day | ORAL | Status: DC
  Administered 2023-06-08: 1 via ORAL
  Filled 2023-06-08: qty 1

## 2023-06-08 MED ORDER — FAMOTIDINE 20 MG PO TABS: 40.0000 mg | ORAL_TABLET | Freq: Every day | ORAL | Status: DC

## 2023-06-08 MED ORDER — FENTANYL BOLUS VIA INFUSION: 25.0000 ug | INTRAVENOUS | Status: DC | PRN

## 2023-06-08 MED ORDER — NOREPINEPHRINE 4 MG/250ML-% IV SOLN
1.0000 ug/min | INTRAVENOUS | Status: DC
Start: 2023-06-08 — End: 2023-06-08

## 2023-06-08 MED ORDER — SODIUM CHLORIDE 0.9 % IV SOLN
500.0000 mg | INTRAVENOUS | Status: DC
  Administered 2023-06-09: 500 mg via INTRAVENOUS
  Filled 2023-06-08: qty 5

## 2023-06-08 MED ORDER — INSULIN ASPART 100 UNIT/ML IJ SOLN: 0.0000 [IU] | INTRAMUSCULAR | Status: DC

## 2023-06-08 MED ORDER — SODIUM CHLORIDE 0.9% FLUSH: 10.0000 mL | INTRAVENOUS | Status: DC | PRN

## 2023-06-08 MED ORDER — CHLORHEXIDINE GLUCONATE CLOTH 2 % EX PADS
6.0000 | MEDICATED_PAD | Freq: Every day | CUTANEOUS | Status: DC
  Administered 2023-06-08 – 2023-06-09 (×2): 6 via TOPICAL

## 2023-06-08 MED ORDER — NALOXONE HCL 0.4 MG/ML IJ SOLN
INTRAMUSCULAR | Status: AC
  Filled 2023-06-08: qty 1

## 2023-06-08 MED ORDER — SODIUM CHLORIDE 0.9 % IV BOLUS: 500.0000 mL | Freq: Once | INTRAVENOUS | Status: DC | PRN

## 2023-06-08 MED ORDER — FENTANYL 2500MCG IN NS 250ML (10MCG/ML) PREMIX INFUSION
25.0000 ug/h | INTRAVENOUS | Status: DC
  Administered 2023-06-08: 25 ug/h via INTRAVENOUS
  Filled 2023-06-08: qty 250

## 2023-06-08 MED ORDER — NALOXONE HCL 0.4 MG/ML IJ SOLN
INTRAMUSCULAR | Status: AC
Start: 2023-06-08 — End: 2023-06-09
  Filled 2023-06-08: qty 1

## 2023-06-08 MED ORDER — POTASSIUM CHLORIDE CRYS ER 20 MEQ PO TBCR
40.0000 meq | EXTENDED_RELEASE_TABLET | Freq: Once | ORAL | Status: AC
  Administered 2023-06-08: 40 meq via ORAL
  Filled 2023-06-08: qty 2

## 2023-06-08 MED ORDER — SODIUM BICARBONATE 8.4 % IV SOLN: 50.0000 meq | Freq: Once | INTRAVENOUS | Status: AC

## 2023-06-08 MED ORDER — AMIODARONE LOAD VIA INFUSION
150.0000 mg | Freq: Once | INTRAVENOUS | Status: AC
  Administered 2023-06-08: 150 mg via INTRAVENOUS
  Filled 2023-06-08: qty 83.34

## 2023-06-08 MED ORDER — ORAL CARE MOUTH RINSE
15.0000 mL | OROMUCOSAL | Status: DC
  Administered 2023-06-08 – 2023-06-09 (×9): 15 mL via OROMUCOSAL

## 2023-06-08 MED ORDER — DOCUSATE SODIUM 50 MG/5ML PO LIQD: 100.0000 mg | Freq: Two times a day (BID) | ORAL | Status: DC

## 2023-06-08 MED ORDER — ORAL CARE MOUTH RINSE: 15.0000 mL | OROMUCOSAL | Status: DC | PRN

## 2023-06-08 MED ORDER — NALOXONE HCL 0.4 MG/ML IJ SOLN: 0.2000 mg | INTRAMUSCULAR | Status: DC | PRN

## 2023-06-08 MED ORDER — SODIUM BICARBONATE 8.4 % IV SOLN
50.0000 meq | Freq: Once | INTRAVENOUS | Status: AC
  Administered 2023-06-08: 50 meq via INTRAVENOUS

## 2023-06-08 MED ORDER — SODIUM CHLORIDE 0.9% FLUSH
10.0000 mL | Freq: Two times a day (BID) | INTRAVENOUS | Status: DC
  Administered 2023-06-08: 10 mL
  Administered 2023-06-09: 20 mL

## 2023-06-08 MED ORDER — FENTANYL CITRATE PF 50 MCG/ML IJ SOSY: 25.0000 ug | PREFILLED_SYRINGE | Freq: Once | INTRAMUSCULAR | Status: DC

## 2023-06-08 MED ORDER — FUROSEMIDE 10 MG/ML IJ SOLN
80.0000 mg | Freq: Once | INTRAMUSCULAR | Status: AC
  Administered 2023-06-08: 80 mg via INTRAVENOUS
  Filled 2023-06-08: qty 8

## 2023-06-08 MED ORDER — AMIODARONE HCL IN DEXTROSE 360-4.14 MG/200ML-% IV SOLN
60.0000 mg/h | INTRAVENOUS | Status: DC
  Administered 2023-06-08: 60 mg/h via INTRAVENOUS
  Filled 2023-06-08: qty 400

## 2023-06-08 MED ORDER — AMIODARONE HCL IN DEXTROSE 360-4.14 MG/200ML-% IV SOLN: 30.0000 mg/h | INTRAVENOUS | Status: DC

## 2023-06-08 MED ORDER — PROPOFOL 1000 MG/100ML IV EMUL
5.0000 ug/kg/min | INTRAVENOUS | Status: DC
Start: 2023-06-08 — End: 2023-06-09
  Administered 2023-06-09: 35 ug/kg/min via INTRAVENOUS
  Administered 2023-06-09: 40 ug/kg/min via INTRAVENOUS
  Filled 2023-06-08 (×2): qty 100

## 2023-06-08 MED ORDER — NOREPINEPHRINE 4 MG/250ML-% IV SOLN
2.0000 ug/min | INTRAVENOUS | Status: DC
  Administered 2023-06-08: 2 ug/min via INTRAVENOUS
  Filled 2023-06-08 (×2): qty 250

## 2023-06-08 MED ORDER — PROPOFOL 1000 MG/100ML IV EMUL
INTRAVENOUS | Status: AC
  Filled 2023-06-08: qty 100

## 2023-06-08 NOTE — TOC CAGE-AID Note (Signed)
 Transition of Care [facility]) - CAGE-AID Screening  Patient Details  Name: Alexandra [name] MRN: [id number] Date of Birth: [birth year]  Clinical Narrative:  Patient denies any current alcohol or drug use, substance abuse resources not provided at this time.  CAGE-AID Screening:   Have You Ever Felt You Ought to Cut Down on Your Drinking or Drug Use?: No Have People Annoyed You By Critizing Your Drinking Or Drug Use?: No Have You Felt Bad Or Guilty About Your Drinking Or Drug Use?: No Have You Ever Had a Drink or Used Drugs First Thing In The Morning to Steady Your Nerves or to Get Rid of a Hangover?: No CAGE-AID Score: 0  Substance Abuse Education Offered: No

## 2023-06-08 NOTE — Progress Notes (Signed)
 Amiodarone started by Fara Boros ICU RN, and will be managed for shift by myself.

## 2023-06-08 NOTE — Progress Notes (Signed)
 Patients pressure was reading 70s/40s and HR dropped to 55.  I stopped Amiodarone, contacted MD, and rapid RN.  MD agreed to hold Amio but did not want to give a fluid bolus due to HF.  Pressure has improved to 90s/50s.  Will continue to monitor BP closely.

## 2023-06-08 NOTE — Progress Notes (Signed)
 ANTICOAGULATION CONSULT NOTE  Pharmacy Consult for Heparin Indication: pulmonary embolus  Allergies  Allergen Reactions   Codeine Nausea Only   Symproic [Naldemedine] Nausea And Vomiting    Patient Measurements: Height: 5\' 5"  (165.1 cm) Weight: 62.7 kg (138 lb 3.7 oz) IBW/kg (Calculated) : 57 Heparin Dosing Weight: 65.8 kg  Vital Signs: Temp: 98.8 F (37.1 C) (03/16 1750) Temp Source: Oral (03/16 1604) BP: 39/12 (03/16 1930) Pulse Rate: 60 (03/16 1917)  Labs: Recent Labs    06/07/23 1415 06/07/23 1428 06/07/23 1733 06/08/23 0232 06/08/23 1156 06/08/23 2034 06/08/23 2036  HGB 9.9* 11.9*  --  9.9*  --  9.3* 10.2*  HCT 32.9* 35.0*  --  32.7*  --  33.4* 30.0*  PLT 205  --   --  180  --  147*  --   HEPARINUNFRC  --   --   --  0.79* 0.52 0.28*  --   CREATININE 1.78* 1.90*  --  1.70*  --  2.06*  --   TROPONINIHS 569*  --  510*  --   --  613*  --     Estimated Creatinine Clearance: 21.6 mL/min (A) (by C-G formula based on SCr of 2.06 mg/dL (H)).   Medical History: Past Medical History:  Diagnosis Date   Arthritis    COPD (chronic obstructive pulmonary disease) (HCC) 05/22/2016   Severely decreased DLCO by PFTs in April 2019 but only mild obstructive disease.    Coronary artery disease, non-occlusive 11/28/2016   R&LHC: 65% distal LAD lesion -- likely not angiographically significant->medical management. Normal LVEF. Moderately elevated LVEDP. I aortic valve gradient in the Cath Lab, moderate aortic stenosis. This would suggest that the stenosis is on the moderate side of moderate to severe.  Severe pulmonary hypertension (likely mixed).  AVA 0.96 cm; P-P gradient ~ 30 mmHg, Mean ~24.5-27.5 (Moderate AS)    GERD (gastroesophageal reflux disease)    Heart murmur    Hx of adenomatous colonic polyps 10/19/2015   Hyperlipidemia    Hypertension    Resistant hypertension-multiple medications likely partially related to RAS.   Moderate mitral regurgitation by prior  echocardiography 10/2016   Echocardiogram revealed moderate mitral regurgitation; most recent Echo 02/12/2019- MAC with moderate MR   Multiple renal cysts 06/2020   Renal A Dopplers: Exophytic Cyst noted in Upper pole of Right Kidney (1.6x1.7x1.7 cm); Exophytic cysts: lower pole of Left Kidney 1.9 x 1.7 x 1.9 cm, and 1.4 x 1.2 x 1.5 cm.   PAOD (peripheral arterial occlusive disease) (HCC) 06/2020   Abd AoGram-BLE Runoff: Severe Bilat RA stenosis - small. atretic L RA. Severe stenosis of R Com Iliac A - Ext Iliac A (PTA-Stent). Bilateral SFA flush CTO. Mod R CFA, L Iliac & CFA. Bilateral SFA reconstitution distal Pop A. R PTA flush CTO 2 V runoff. L LE 3 V runoff unitl distal LPTA CTO.   Small Infrarenal aneurysmal areas with extensive atheroma - no obstructive disease   Pulmonary hypertension associated Aortic Stenosis, Mitral Regurgitation & COPD    Peak PAP by Echo ~54 mmHg;  by Cath PAP/Mean 71/32 mmHg, 53 mmHg (with PCWP & LVEDP ~24-26 mmHg); TPG ~26 mmHg   Pulmonary nodules    Renal artery stenosis, native, bilateral (HCC) 06/2020   Abd Ao Gram: LRA ~ 99% ostial stenosis with small atretic L Kidney (f/u dopplers - Abnormal L kidney size & Resistive Index); R RA ostial 80-90% stenosis (confirmed by doppler Resistive Index, but normal R kidney size.   Renal  artery stenosis, native, bilateral (HCC) 06/2020   Abdominal aortic angiogram revealed bilateral renal artery stenosis, however Dopplers confirmed although the left renal artery was stenosed, the kidney itself was atrophied. => R Renal A Stent 05/2013 (Dr. Kirke Corin)   S/P TAVR (transcatheter aortic valve replacement) 12/30/2017   Edward's Sapien 3 bioprosthetic THB via TF approach   Severe aortic stenosis 2018   Underwent TAVR in 2019   Superior mesenteric artery stenosis (HCC) 06/2020   Abdominal Ao Dopplers - 70-90% Ostial SMA stenosis with normal Celiac A.    Medications:  Medications Prior to Admission  Medication Sig Dispense Refill  Last Dose/Taking   fluticasone-salmeterol (ADVAIR) 100-50 MCG/ACT AEPB Inhale 1 puff into the lungs 2 (two) times daily.   Taking   albuterol (VENTOLIN HFA) 108 (90 Base) MCG/ACT inhaler Inhale 2 puffs into the lungs every 4 (four) hours as needed.      alendronate (FOSAMAX) 70 MG tablet Take 70 mg by mouth once a week.      aspirin 81 MG tablet Take 1 tablet (81 mg total) by mouth 2 (two) times daily at 10 AM and 5 PM. (Patient taking differently: Take 81 mg by mouth 3 (three) times a week.) 30 tablet 0    bisoprolol-hydrochlorothiazide (ZIAC) 10-6.25 MG tablet TAKE 1 TABLET BY MOUTH DAILY 90 tablet 0    clopidogrel (PLAVIX) 75 MG tablet TAKE 1 TABLET(75 MG) BY MOUTH DAILY (Patient taking differently: Take 75 mg by mouth daily.) 90 tablet 0    cyclobenzaprine (FLEXERIL) 10 MG tablet Take 10 mg by mouth at bedtime.      esomeprazole (NEXIUM) 40 MG capsule TAKE 1 CAPSULE BY MOUTH DAILY AT NOON (Patient taking differently: Take 40 mg by mouth daily at 12 noon.) 90 capsule 3    furosemide (LASIX) 20 MG tablet TAKE 1 TABLET BY MOUTH DAILY AS NEEDED. INCREASE SWELLING (Patient taking differently: Take 20 mg by mouth as needed for fluid.) 30 tablet 6    losartan (COZAAR) 100 MG tablet TAKE 1 TABLET(100 MG) BY MOUTH DAILY (Patient taking differently: Take 100 mg by mouth daily.) 90 tablet 2    oxyCODONE-acetaminophen (PERCOCET) 10-325 MG tablet Take 1 tablet by mouth 5 (five) times daily.      potassium chloride SA (KLOR-CON M) 20 MEQ tablet Take 1 tablet (20 mEq total) by mouth daily. KEEP OV. 30 tablet 1    rosuvastatin (CRESTOR) 20 MG tablet TAKE 1 TABLET(20 MG) BY MOUTH DAILY (Patient taking differently: Take 20 mg by mouth daily.) 90 tablet 3    tiZANidine (ZANAFLEX) 4 MG tablet Take 4 mg by mouth at bedtime as needed for muscle spasms.      Scheduled:   Chlorhexidine Gluconate Cloth  6 each Topical Daily   docusate  100 mg Per Tube BID   famotidine  40 mg Per Tube Daily   feeding supplement  237  mL Oral BID BM   fentaNYL (SUBLIMAZE) injection  25 mcg Intravenous Once   [START ON ] insulin aspart  0-15 Units Subcutaneous Q4H   multivitamin with minerals  1 tablet Oral Daily   naloxone       naloxone       mouth rinse  15 mL Mouth Rinse Q2H   oxyCODONE  10 mg Oral Q4H   pantoprazole  40 mg Oral Daily   polyethylene glycol  17 g Per Tube Daily   rosuvastatin  20 mg Oral Daily   sodium chloride flush  10-40 mL Intracatheter Q12H  Infusions:   sodium chloride     amiodarone     azithromycin Stopped (06/08/23 1934)   cefTRIAXone (ROCEPHIN)  IV Stopped (06/08/23 1828)   fentaNYL infusion INTRAVENOUS 25 mcg/hr (06/08/23 2054)   heparin Stopped (06/08/23 1940)   norepinephrine (LEVOPHED) Adult infusion 2 mcg/min (06/08/23 2122)   propofol (DIPRIVAN) infusion 20 mcg/kg/min (06/08/23 2030)   sodium chloride     PRN: acetaminophen, bisacodyl, fentaNYL, hydrALAZINE, HYDROmorphone (DILAUDID) injection, methocarbamol **OR** methocarbamol (ROBAXIN) injection, naloxone, naloxone, naloxone, ondansetron (ZOFRAN) IV, mouth rinse, polyethylene glycol, sodium chloride, sodium chloride flush  Assessment: 73 yof with a history of PAD, SMA stenosis, CAD, COPD, OS, GERD, HLD, HTN, Pulmonary HTN, CHF, paroxysmal atrial tachycardia . Patient is presenting with SOB and leg pain. Heparin per pharmacy consult placed for pulmonary embolus.  CTA w/ bilateral PE without evidence for RHS  Patient is not on anticoagulation prior to arrival.  Heparin paused during ACLS for PEA arrest this evening. Discussed with Dr. Sherryll Burger (CCM), ok to resume after CVC placement.  Goal of Therapy:  Heparin level 0.3-0.7 units/ml Monitor platelets by anticoagulation protocol: Yes   Plan:  Resume heparin 1100 units/h Repeat heparin level in 8h  Fredonia Highland, PharmD, [place], [facility] Clinical Pharmacist [phone] Please check AMION for all [facility] Pharmacy numbers 06/08/2023

## 2023-06-08 NOTE — Consult Note (Signed)
 Reason for Consult:Right hip fracture Referring Physician: Renella Cunas, MD    HPI:  Alexandra [name] is a 73 y.o. female with medical history significant of COPD, CAD, HTN, HLD, PAD, pulmonary HTN, B renal artery stenosis, and s/p TAVR who presented on 3/15 with SOB and R leg pain. They live in an older home with lots of varying floor heights and she tripped over a step on 3/13 and fell. Her husband was able to get her to the couch but she has been unable to get up since (and refused to come in for evaluation until today). Pt admitted under medicine service due to bilateral PE and comminuted intertrochanteric right hip fracture.  Past Medical History:  Diagnosis Date   Arthritis    COPD (chronic obstructive pulmonary disease) (HCC) 05/22/2016   Severely decreased DLCO by PFTs in April 2019 but only mild obstructive disease.    Coronary artery disease, non-occlusive 11/28/2016   R&LHC: 65% distal LAD lesion -- likely not angiographically significant->medical management. Normal LVEF. Moderately elevated LVEDP. I aortic valve gradient in the Cath Lab, moderate aortic stenosis. This would suggest that the stenosis is on the moderate side of moderate to severe.  Severe pulmonary hypertension (likely mixed).  AVA 0.96 cm; P-P gradient ~ 30 mmHg, Mean ~24.5-27.5 (Moderate AS)    GERD (gastroesophageal reflux disease)    Heart murmur    Hx of adenomatous colonic polyps 10/19/2015   Hyperlipidemia    Hypertension    Resistant hypertension-multiple medications likely partially related to RAS.   Moderate mitral regurgitation by prior echocardiography 10/2016   Echocardiogram revealed moderate mitral regurgitation; most recent Echo 02/12/2019- MAC with moderate MR   Multiple renal cysts 06/2020   Renal A Dopplers: Exophytic Cyst noted in Upper pole of Right Kidney (1.6x1.7x1.7 cm); Exophytic cysts: lower pole of Left Kidney 1.9 x 1.7 x 1.9 cm, and 1.4 x 1.2 x 1.5 cm.   PAOD (peripheral arterial occlusive  disease) (HCC) 06/2020   Abd AoGram-BLE Runoff: Severe Bilat RA stenosis - small. atretic L RA. Severe stenosis of R Com Iliac A - Ext Iliac A (PTA-Stent). Bilateral SFA flush CTO. Mod R CFA, L Iliac & CFA. Bilateral SFA reconstitution distal Pop A. R PTA flush CTO 2 V runoff. L LE 3 V runoff unitl distal LPTA CTO.   Small Infrarenal aneurysmal areas with extensive atheroma - no obstructive disease   Pulmonary hypertension associated Aortic Stenosis, Mitral Regurgitation & COPD    Peak PAP by Echo ~54 mmHg;  by Cath PAP/Mean 71/32 mmHg, 53 mmHg (with PCWP & LVEDP ~24-26 mmHg); TPG ~26 mmHg   Pulmonary nodules    Renal artery stenosis, native, bilateral (HCC) 06/2020   Abd Ao Gram: LRA ~ 99% ostial stenosis with small atretic L Kidney (f/u dopplers - Abnormal L kidney size & Resistive Index); R RA ostial 80-90% stenosis (confirmed by doppler Resistive Index, but normal R kidney size.   Renal artery stenosis, native, bilateral (HCC) 06/2020   Abdominal aortic angiogram revealed bilateral renal artery stenosis, however Dopplers confirmed although the left renal artery was stenosed, the kidney itself was atrophied. => R Renal A Stent 05/2013 (Dr. Kirke Corin)   S/P TAVR (transcatheter aortic valve replacement) 12/30/2017   Edward's Sapien 3 bioprosthetic THB via TF approach   Severe aortic stenosis 2018   Underwent TAVR in 2019   Superior mesenteric artery stenosis (HCC) 06/2020   Abdominal Ao Dopplers - 70-90% Ostial SMA stenosis with normal Celiac A.    Past  Surgical History:  Procedure Laterality Date   ABDOMINAL AORTOGRAM W/LOWER EXTREMITY N/A 07/12/2020   Procedure: ABDOMINAL AORTOGRAM W/LOWER EXTREMITY;  Surgeon: Iran Ouch, MD;  Location: [facility] INVASIVE CV LAB;; Severe B-RA stenosis - sm/atretic L RA. Severe Dz R Com-Ext Iliac A (PTA-Stent). B-SFA flush CTO. Mod R CFA, L Iliac-CFA. B-SFA recon @ dPop A. R PTA flush CTO & 2 V runoff. L LE 3 V runoff w/ distal LPTA CTO.   Sm InfraRenal aneurysms w/  extensive atheroma - no obstructive Dz   APPENDECTOMY     BREAST CYST EXCISION Right 1970   CARDIAC VALVE REPLACEMENT     CATARACT EXTRACTION W/ INTRAOCULAR LENS IMPLANT Right    CHOLECYSTECTOMY OPEN     COLONOSCOPY  2003, 2017   CTA Chest  09/2016   Thoracic Aortic Ca2+ w/o dilation.  Coronary Calcification noted. PA normal. Mild Emphysematous changes w/ mild LLL scarring.     FRACTURE SURGERY     2007 -right tib /fib fracture ----07/2009 right femur fx after a fall   FRACTURE SURGERY Right 2011   Femur   PERIPHERAL VASCULAR CATHETERIZATION N/A 08/09/2014   Procedure: Aortic Arch Angiography;  Surgeon: Nada Libman, MD;  Location: [facility] INVASIVE CV LAB;  Service: Cardiovascular: Type 1 Arch. No significant stenosis, aneurysmal degeneration or dissection.  No luminal irregularity seen in R or L SubClavian, Brachial or Radial A. Chronic distal ulnar A occlusion Bilatera.   PERIPHERAL VASCULAR CATHETERIZATION Right 08/09/2014   Procedure: Upper Extremity Angiography;  Surgeon: Nada Libman, MD;  Location: [facility] INVASIVE CV LAB;  Service: Cardiovascular;  Laterality: Right;   PERIPHERAL VASCULAR INTERVENTION  07/12/2020   Procedure: PERIPHERAL VASCULAR INTERVENTION;  Surgeon: Iran Ouch, MD;  Location: [facility] INVAS[facility] CV LAB;  Service: Cardiovascular;;  R Common-External Iliac A PTA & Stent   RENAL ANGIOGRAPHY N/A 06/06/2021   Procedure: RENAL ANGIOGRAPHY;  Surgeon: Iran Ouch, MD;  Location: [facility] IN[facility];  Service: Cardiovascular; severe 90% ostial R Renal A stenosis (also noted was severe L Rena A stenosis, but known L kidney atrophy) => Stent R Renal A   RENAL INTERVENTION  06/06/2021   Procedure: RENAL INTERVENTION;  Surgeon: Iran Ouch, MD;  Location: [facility] INV[facility] CV LAB;  Service: Cardiovascular;;  Successful stent (Herculink 6.5 mm x 15 mm) placement to the right renal artery via the left radial artery approach.   RIGHT/LEFT HEART CATH AND CORONARY ANGIOGRAPHY N/A  12/06/2016   Procedure: RIGHT/LEFT HEART CATH AND CORONARY ANGIOGRAPHY;  Surgeon: Marykay Lex, MD;  Location: [facility] INVASIVE CV LAB: Cor Angio: 65% dLAD (Med Rx). AVA 0.96 cm; P-P gradient ~ 30 mmHg, Mean ~24.5-27.5 (Mod AS); RHC #s: RAP 8 mmHg, RVP/EDP: 71/8/15 mmHg, PCWP: 21-24 mmHg, PAP/mean: 71/32/52 mmHg = Severe Mixed Pulmonary HTN; LVP/EDP 205/17/26 mmHg ; CO/CI by Fick: 4.35, 2.44 - mildly reduced   RIGHT/LEFT HEART CATH AND CORONARY ANGIOGRAPHY N/A 11/20/2017   Procedure: RIGHT/LEFT HEART CATH AND CORONARY ANGIOGRAPHY;  Surgeon: Laurey Morale, MD;  Location: [facility] INVASIVE CV LAB;  Service: Cardiovascular;  Laterality: N/A;   TEE WITHOUT CARDIOVERSION N/A 12/30/2017   Procedure: (Intra-Op) TRANSESOPHAGEAL ECHOCARDIOGRAM (TEE);  Surgeon: Kathleene Hazel, MD;  Location: [facility] OR;  Service: Open Heart Surgery; Post TAVR good position of 23 mm AP and 3 valve.  No new or WMA.  EF 55-60%.  No effusion.  Mild perivalvular regurgitation seen.  Mean gradient 7 mmHg.  Peak 16 mmHg.   TOTAL KNEE ARTHROPLASTY Left  12/01/2018   Procedure: Left Knee Arthroplasty;  Surgeon: Marcene Corning, MD;  Location: [facility] ORS;  Service: Orthopedics;  Laterality: Left;   TRANSCATHETER AORTIC VALVE REPLACEMENT, TRANSFEMORAL N/A 12/30/2017   Procedure: TRANSCATHETER AORTIC VALVE REPLACEMENT, TRANSFEMORAL;  Surgeon: Kathleene Hazel, MD;  Location: [facility] OR;  Service: Open Heart Surgery;  Laterality: N/A; 23 mm Edwards SAPIEN 3 THV   TRANSTHORACIC ECHOCARDIOGRAM  10/30/2016   Mild Concentric LVH. EF 55-60%. No RWMA, Gr 2 DD. Mod-Severe AS (mean-peak Gradient 31 mmHG - 64 mmHg), Mod MR. Mod LA dilation, Mod PA HTN - peak pressure ~54 mmHg). = Progression of AS from 2014   TRANSTHORACIC ECHOCARDIOGRAM  01/2019   EF 55 to 60%.  Elevated LVEDP.  GR 1 DD.  No R WMA.  Moderate LA dilation.  Well-seated stented bioprosthetic aortic valve.  Mean gradient 13 mercury.  Moderate MAC moderate MR.  No evidence of mildly  elevated RAP/CVP and moderately elevated PA P   TRANSTHORACIC ECHOCARDIOGRAM  10/2017   a) Pre-TAVR 10/31/17: EF 55 to 60%.  GR 1 DD.  Calcific severe AS.  Peak Grad 62 mmHg, mean 32 mmHg.  Progression of Dz.  AVA ~0.8 cm.; b) Post TAVR 10/9/'19: EF 60-65%.  GR 1 DD.  Stable appearing 23 mm Edwards's Sapien AV bioprosthesis.  No perivalvular AI.  Mean Grad 22 mmHg; c) 11/13/'19: AoV mean Grad 19 mmHg,, Peak 36 mmHg. PAP ~33 mmHg   TRANSTHORACIC ECHOCARDIOGRAM  08/02/2020   EF 60 to 65%.  GR 1 DD.  Elevated LAP/LVEDP.  Mildly elevated PAP.  Mild LA dilation.  23 mm SAPIEN 3 prosthetic TAVR valve present.  Mild perivalvular leak.  Mean gradient 16 mmHg. Mod MR.   ULTRASOUND GUIDANCE FOR VASCULAR ACCESS  08/09/2014   Procedure: Ultrasound Guidance For Vascular Access;  Surgeon: Nada Libman, MD;  Location: [facility] INVASIVE CV LAB;  Service: Cardiovascular;;    Family History  Problem Relation Age of Onset   Hypertension Mother    Heart disease Mother        She is unaware of the details   Heart disease Father        Unaware of details   Heart attack Brother    Colon cancer Neg Hx    Colon polyps Neg Hx    Esophageal cancer Neg Hx    Rectal cancer Neg Hx    Stomach cancer Neg Hx     Social History:  reports that she has been smoking cigarettes. She has a 4 pack-year smoking history. She has never used smokeless tobacco. She reports that she does not currently use alcohol. She reports that she does not use drugs.  Allergies:  Allergies  Allergen Reactions   Codeine Nausea Only   Symproic [Naldemedine] Nausea And Vomiting    Medications: I have reviewed the patient's current medications.  Results for orders placed or performed during the hospital encounter of 06/07/23 (from the past 48 hours)  Magnesium     Status: None   Collection Time: 06/07/23  2:15 PM  Result Value Ref Range   Magnesium 2.3 1.7 - 2.4 mg/dL    Comment: Performed at [facility], [address]  [place], [place] [address]  Brain natriuretic peptide (order ONLY if patient c/o SOB)     Status: Abnormal   Collection Time: 06/07/23  2:15 PM  Result Value Ref Range   B Natriuretic Peptide >4,500.0 (H) 0.0 - 100.0 pg/mL    Comment: Performed at [facility], [address], [place], [place] [address]  CBC     Status: Abnormal   Collection Time: 06/07/23  2:15 PM  Result Value Ref Range   WBC 15.0 (H) 4.0 - 10.5 K/uL   RBC 4.06 3.87 - 5.11 MIL/uL   Hemoglobin 9.9 (L) 12.0 - 15.0 g/dL   HCT 02.7 (L) 25.3 - 66.4 %   MCV 81.0 80.0 - 100.0 fL   MCH 24.4 (L) 26.0 - 34.0 pg   MCHC 30.1 30.0 - 36.0 g/dL   RDW 40.3 (H) 47.4 - 25.9 %   Platelets 205 150 - 400 K/uL   nRBC 0.1 0.0 - 0.2 %    Comment: Performed at [facility] Lab, [address] [place], [place] [address]  Comprehensive metabolic panel     Status: Abnormal   Collection Time: 06/07/23  2:15 PM  Result Value Ref Range   Sodium 139 135 - 145 mmol/L   Potassium 3.9 3.5 - 5.1 mmol/L   Chloride 107 98 - 111 mmol/L   CO2 17 (L) 22 - 32 mmol/L   Glucose, Bld 95 70 - 99 mg/dL    Comment: Glucose reference range applies only to samples taken after fasting for at least 8 hours.   BUN 41 (H) 8 - 23 mg/dL   Creatinine, Ser 5.64 (H) 0.44 - 1.00 mg/dL   Calcium 7.9 (L) 8.9 - 10.3 mg/dL   Total Protein 5.8 (L) 6.5 - 8.1 g/dL   Albumin 2.9 (L) 3.5 - 5.0 g/dL   AST 332 (H) 15 - 41 U/L   ALT 117 (H) 0 - 44 U/L   Alkaline Phosphatase 81 38 - 126 U/L   Total Bilirubin 2.9 (H) 0.0 - 1.2 mg/dL   GFR, Estimated 30 (L) >60 mL/min    Comment: (NOTE) Calculated using the CKD-EPI Creatinine Equation (2021)    Anion gap 15 5 - 15    Comment: Performed at [facility], [address], [place], [place] [address]  Lipase, blood     Status: None   Collection Time: 06/07/23  2:15 PM  Result Value Ref Range   Lipase 21 11 - 51 U/L    Comment: Performed at [facility], [address] [place], [place] [address]  Troponin I (High  Sensitivity)     Status: Abnormal   Collection Time: 06/07/23  2:15 PM  Result Value Ref Range   Troponin I (High Sensitivity) 569 (HH) <18 ng/L    Comment: CRITICAL RESULT CALLED TO, READ BACK BY AND VERIFIED WITH Levy Sjogren RN , @1610  , 06/07/23, [place]ab[place],T. (NOTE) Elevated high sensitivity troponin I (hsTnI) values and significant  changes across serial measurements may suggest ACS but many other  chronic and acute conditions are known to elevate hsTnI results.  Refer to the "Links" section for chest pain algorithms and additional  guidance. Performed at [facility], [address] [place], [place] [address]   I-stat chem 8, ED (not at [facility], [facility] or [facility])     Status: Abnormal   Collection Time: 06/07/23  2:28 PM  Result Value Ref Range   Sodium 137 135 - 145 mmol/L   Potassium 3.9 3.5 - 5.1 mmol/L   Chloride 109 98 - 111 mmol/L   BUN 39 (H) 8 - 23 mg/dL   Creatinine, Ser 0.10 (H) 0.44 - 1.00 mg/dL   Glucose, Bld 932 (H) 70 - 99 mg/dL    Comment: Glucose reference range  applies only to samples taken after fasting for at least 8 hours.   Calcium, Ion 0.95 (L) 1.15 - 1.40 mmol/L   TCO2 19 (L) 22 - 32 mmol/L   Hemoglobin 11.9 (L) 12.0 - 15.0 g/dL   HCT 40.9 (L) 81.1 - 91.4 %  Resp panel by RT-PCR (RSV, Flu A&B, Covid) Anterior Nasal Swab     Status: None   Collection Time: 06/07/23  2:31 PM   Specimen: Anterior Nasal Swab  Result Value Ref Range   SARS Coronavirus 2 by RT PCR NEGATIVE NEGATIVE   Influenza A by PCR NEGATIVE NEGATIVE   Influenza B by PCR NEGATIVE NEGATIVE    Comment: (NOTE) The Xpert Xpress SARS-CoV-2/FLU/RSV plus assay is intended as an aid in the diagnosis of influenza from Nasopharyngeal swab specimens and should not be used as a sole basis for treatment. Nasal washings and aspirates are unacceptable for Xpert Xpress SARS-CoV-2/FLU/RSV testing.  Fact Sheet for Patients: BloggerCourse.com  Fact Sheet for Healthcare  Providers: SeriousBroker.it  This test is not yet approved or cleared by the [place] FDA and has been authorized for detection and/or diagnosis of SARS-CoV-2 by FDA under an Emergency Use Authorization (EUA). This EUA will remain in effect (meaning this test can be used) for the duration of the COVID-19 declaration under Section 564(b)(1) of the Act, 21 U.S.C. section 360bbb-3(b)(1), unless the authorization is terminated or revoked.     Resp Syncytial Virus by PCR NEGATIVE NEGATIVE    Comment: (NOTE) Fact Sheet for Patients: BloggerCourse.com  Fact Sheet for Healthcare Providers: SeriousBroker.it  This test is not yet approved or cleared by the [place] FDA and has been authorized for detection and/or diagnosis of SARS-CoV-2 by FDA under an Emergency Use Authorization (EUA). This EUA will remain in effect (meaning this test can be used) for the duration of the COVID-19 declaration under Section 564(b)(1) of the Act, 21 U.S.C. section 360bbb-3(b)(1), unless the authorization is terminated or revoked.  Performed at [facility], [address], [place], [place] [address]   Blood culture (routine x 2)     Status: None (Preliminary result)   Collection Time: 06/07/23  2:52 PM   Specimen: BLOOD LEFT FOREARM  Result Value Ref Range   Specimen Description BLOOD LEFT FOREARM    Special Requests      BOTTLES DRAWN AEROBIC AND ANAEROBIC Blood Culture results may not be optimal due to an inadequate volume of blood received in culture bottles   Culture      NO GROWTH < 24 HOURS Performed at [facility], [address], [place], [place] [address]    Report Status PENDING   Blood culture (routine x 2)     Status: None (Preliminary result)   Collection Time: 06/07/23  2:57 PM   Specimen: BLOOD RIGHT FOREARM  Result Value Ref Range   Specimen Description BLOOD RIGHT FOREARM    Special  Requests      BOTTLES DRAWN AEROBIC AND ANAEROBIC Blood Culture results may not be optimal due to an inadequate volume of blood received in culture bottles   Culture      NO GROWTH < 24 HOURS Performed at [facility], [address], [place], [place] [address]    Report Status PENDING   I-Stat CG4 Lactic Acid     Status: Abnormal   Collection Time: 06/07/23  2:58 PM  Result Value Ref Range   Lactic Acid, Venous 2.9 (HH) 0.5 - 1.9 mmol/L  Comment NOTIFIED PHYSICIAN   Troponin I (High Sensitivity)     Status: Abnormal   Collection Time: 06/07/23  5:33 PM  Result Value Ref Range   Troponin I (High Sensitivity) 510 (HH) <18 ng/L    Comment: CRITICAL VALUE NOTED. VALUE IS CONSISTENT WITH PREVIOUSLY REPORTED/CALLED VALUE (NOTE) Elevated high sensitivity troponin I (hsTnI) values and significant  changes across serial measurements may suggest ACS but many other  chronic and acute conditions are known to elevate hsTnI results.  Refer to the "Links" section for chest pain algorithms and additional  guidance. Performed at [facility], [address] [place], [place] [address]   I-Stat CG4 Lactic Acid     Status: Abnormal   Collection Time: 06/07/23  5:57 PM  Result Value Ref Range   Lactic Acid, Venous 2.0 (HH) 0.5 - 1.9 mmol/L   Comment NOTIFIED PHYSICIAN   Urinalysis, w/ Reflex to Culture (Infection Suspected) -Urine, Clean Catch     Status: Abnormal   Collection Time: 06/07/23  6:31 PM  Result Value Ref Range   Specimen Source URINE, CLEAN CATCH    Color, Urine AMBER (A) YELLOW    Comment: BIOCHEMICALS MAY BE AFFECTED BY COLOR   APPearance CLOUDY (A) CLEAR   Specific Gravity, Urine 1.028 1.005 - 1.030   pH 5.0 5.0 - 8.0   Glucose, UA NEGATIVE NEGATIVE mg/dL   Hgb urine dipstick LARGE (A) NEGATIVE   Bilirubin Urine NEGATIVE NEGATIVE   Ketones, ur NEGATIVE NEGATIVE mg/dL   Protein, ur 604 (A) NEGATIVE mg/dL   Nitrite NEGATIVE NEGATIVE   Leukocytes,Ua MODERATE (A)  NEGATIVE   RBC / HPF 0-5 0 - 5 RBC/hpf   WBC, UA 21-50 0 - 5 WBC/hpf    Comment:        Reflex urine culture not performed if WBC <=10, OR if Squamous epithelial cells >5. If Squamous epithelial cells >5 suggest recollection.    Bacteria, UA MANY (A) NONE SEEN   Squamous Epithelial / HPF 21-50 0 - 5 /HPF    Comment: Performed at [facility] Lab, [address] [place], [place] [address]  Heparin level (unfractionated)     Status: Abnormal   Collection Time: 06/08/23  2:32 AM  Result Value Ref Range   Heparin Unfractionated 0.79 (H) 0.30 - 0.70 IU/mL    Comment: (NOTE) The clinical reportable range upper limit is being lowered to >1.10 to align with the FDA approved guidance for the current laboratory assay.  If heparin results are below expected values, and patient dosage has  been confirmed, suggest follow up testing of antithrombin III levels. Performed at [facility], [address] [place], [place] [address]   Basic metabolic panel     Status: Abnormal   Collection Time: 06/08/23  2:32 AM  Result Value Ref Range   Sodium 140 135 - 145 mmol/L   Potassium 3.4 (L) 3.5 - 5.1 mmol/L   Chloride 105 98 - 111 mmol/L   CO2 20 (L) 22 - 32 mmol/L   Glucose, Bld 69 (L) 70 - 99 mg/dL    Comment: Glucose reference range applies only to samples taken after fasting for at least 8 hours.   BUN 44 (H) 8 - 23 mg/dL   Creatinine, Ser 7.82 (H) 0.44 - 1.00 mg/dL   Calcium 7.4 (L) 8.9 - 10.3 mg/dL   GFR, Estimated 31 (L) >60 mL/min    Comment: (NOTE) Calculated using the CKD-EPI Creatinine Equation (2021)  Anion gap 15 5 - 15    Comment: Performed at [facility] Lab, [address] [place], [place] [address]  CBC with Differential/Platelet     Status: Abnormal   Collection Time: 06/08/23  2:32 AM  Result Value Ref Range   WBC 19.9 (H) 4.0 - 10.5 K/uL   RBC 4.08 3.87 - 5.11 MIL/uL   Hemoglobin 9.9 (L) 12.0 - 15.0 g/dL   HCT 13.0 (L) 86.5 - 78.4 %   MCV 80.1 80.0 -  100.0 fL   MCH 24.3 (L) 26.0 - 34.0 pg   MCHC 30.3 30.0 - 36.0 g/dL   RDW 69.6 (H) 29.5 - 28.4 %   Platelets 180 150 - 400 K/uL   nRBC 0.1 0.0 - 0.2 %   Neutrophils Relative % 85 %   Neutro Abs 16.9 (H) 1.7 - 7.7 K/uL   Lymphocytes Relative 6 %   Lymphs Abs 1.2 0.7 - 4.0 K/uL   Monocytes Relative 8 %   Monocytes Absolute 1.6 (H) 0.1 - 1.0 K/uL   Eosinophils Relative 0 %   Eosinophils Absolute 0.0 0.0 - 0.5 K/uL   Basophils Relative 0 %   Basophils Absolute 0.0 0.0 - 0.1 K/uL   Immature Granulocytes 1 %   Abs Immature Granulocytes 0.16 (H) 0.00 - 0.07 K/uL    Comment: Performed at [facility], [address] [place], [place] [address]  Glucose, capillary     Status: None   Collection Time: 06/08/23  6:15 AM  Result Value Ref Range   Glucose-Capillary 85 70 - 99 mg/dL    Comment: Glucose reference range applies only to samples taken after fasting for at least 8 hours.   Comment 1 Notify RN    Comment 2 Document in Chart     VAS [place] LOWER EXTREMITY VENOUS (DVT) Result Date: 06/08/2023  Lower Venous DVT Study Patient Name:  HAIDY KACKLEY  Date of Exam:   06/08/2023 Medical Rec #: [id number]       Accession #:    [id number] Date of Birth: September 04, 1950        Patient Gender: F Patient Age:   69 years Exam Location:  [facility] Procedure:      VAS [place] LOWER EXTREMITY VENOUS (DVT) Referring Phys: Jonah Blue --------------------------------------------------------------------------------  Indications: Pulmonary embolism.  Risk Factors: COPD, CAD, HTN, HLD, PAD, pulmonary HTN, B renal artery stenosis, and s/p TAVR. Limitations: Edema, coughing (Valsalva preventing complete compression), pain and poor ultrasound/tissue interface. Comparison Study: No prior study on file Performing Technologist: Sherren Kerns RVS  Examination Guidelines: A complete evaluation includes B-mode imaging, spectral Doppler, color Doppler, and power Doppler as needed of all accessible portions of each  vessel. Bilateral testing is considered an integral part of a complete examination. Limited examinations for reoccurring indications may be performed as noted. The reflux portion of the exam is performed with the patient in reverse Trendelenburg.  +--------+---------------+---------+-----------+----------------+-------------+ RIGHT   CompressibilityPhasicitySpontaneityProperties      Thrombus                                                                 Aging         +--------+---------------+---------+-----------+----------------+-------------+ CFV     Full  Yes      No         Pulsatile                                                                Waveforms                     +--------+---------------+---------+-----------+----------------+-------------+ SFJ     Full                                                             +--------+---------------+---------+-----------+----------------+-------------+ FV Prox Full           Yes      No         Pulsatile                                                                Waveforms                     +--------+---------------+---------+-----------+----------------+-------------+ FV Mid  Full           Yes      Yes        Pulsatile                                                                Waveforms                     +--------+---------------+---------+-----------+----------------+-------------+ FV      Full                                                             Distal                                                                   +--------+---------------+---------+-----------+----------------+-------------+ PFV     Full           Yes      No         Pulsatile  Waveforms                     +--------+---------------+---------+-----------+----------------+-------------+ POP     Full            Yes      Yes        Pulsatile                                                                Waveforms                     +--------+---------------+---------+-----------+----------------+-------------+ PTV     Full                                                             +--------+---------------+---------+-----------+----------------+-------------+ PERO    Full                                                             +--------+---------------+---------+-----------+----------------+-------------+   +--------+---------------+---------+-----------+----------------+-------------+ LEFT    CompressibilityPhasicitySpontaneityProperties      Thrombus                                                                 Aging         +--------+---------------+---------+-----------+----------------+-------------+ CFV     Full           Yes      No         Pulsatile                                                                Waveforms                     +--------+---------------+---------+-----------+----------------+-------------+ SFJ     Full                                                             +--------+---------------+---------+-----------+----------------+-------------+ FV Prox Full                                                             +--------+---------------+---------+-----------+----------------+-------------+  FV Mid  Full                                                             +--------+---------------+---------+-----------+----------------+-------------+ FV      Full           Yes      No         Pulsatile                     Distal                                     Waveforms                     +--------+---------------+---------+-----------+----------------+-------------+ PFV     Full                                                              +--------+---------------+---------+-----------+----------------+-------------+ POP     Full           Yes      No         Pulsatile                                                                Waveforms                     +--------+---------------+---------+-----------+----------------+-------------+ PTV     Full                                                             +--------+---------------+---------+-----------+----------------+-------------+ PERO    Full                                                             +--------+---------------+---------+-----------+----------------+-------------+    Summary: RIGHT: - There is no evidence of deep vein thrombosis in the lower extremity.  - No cystic structure found in the popliteal fossa. Pulsatile Waveforms  LEFT: - There is no evidence of deep vein thrombosis in the lower extremity.  - No cystic structure found in the popliteal fossa. Pulsatile Waveforms.  *See table(s) above for measurements and observations.    Preliminary    CT Angio Chest PE W/Cm &/Or Wo Cm Result Date: 06/07/2023 CLINICAL DATA:  Concern for pulmonary embolism. Short of breath. RIGHT leg pain. Productive cough EXAM: CT ANGIOGRAPHY CHEST CT ABDOMEN AND  PELVIS WITH CONTRAST TECHNIQUE: Multidetector CT imaging of the chest was performed using the standard protocol during bolus administration of intravenous contrast. Multiplanar CT image reconstructions and MIPs were obtained to evaluate the vascular anatomy. Multidetector CT imaging of the abdomen and pelvis was performed using the standard protocol during bolus administration of intravenous contrast. RADIATION DOSE REDUCTION: This exam was performed according to the departmental dose-optimization program which includes automated exposure control, adjustment of the mA and/or kV according to patient size and/or use of iterative reconstruction technique. CONTRAST:  75mL OMNIPAQUE IOHEXOL 350 MG/ML SOLN  COMPARISON:  None Available. FINDINGS: CTA CHEST FINDINGS Cardiovascular: There is filling defect within subsegmental branches of the RIGHT lower lobe (image 81 through series 4). These distal segmental filling defects are occlusive. Probable filling defect within the medial branch of the LEFT lobe pulmonary artery (image 89/series 4. The overall clot burden is mild. No evidence of RIGHT ventricular strain. Upper lung lobes are clear. Mediastinum/Nodes: No axillary or supraclavicular adenopathy. No mediastinal or hilar adenopathy. No pericardial fluid. Esophagus normal. Lungs/Pleura: There is consolidation in the posterior RIGHT lower lobe. There is a moderate size RIGHT effusion. The volume of consolidation appears larger thanexpected from a pulmonary infarction. Favor pneumonia. Mild consolidation atelectasis in the LEFT lower lobe. Small LEFT effusion. Musculoskeletal: No aggressive osseous lesion. Review of the MIP images confirms the above findings. CT ABDOMEN and PELVIS FINDINGS Hepatobiliary: No focal hepatic lesion. Postcholecystectomy. No biliary dilatation. Pancreas: Pancreas is normal. No ductal dilatation. No pancreatic inflammation. Spleen: Normal spleen Adrenals/urinary tract: Adrenal glands normal. The LEFT kidney is atrophic. Small calcification in the RIGHT kidney. Bilateral low-density renal cysts. Ureters and bladder normal. Stomach/Bowel: The stomach, duodenum, and small bowel normal. Multiple diverticula of the descending colon and sigmoid colon without acute inflammation. Vascular/Lymphatic: Abdominal aorta is normal caliber with atherosclerotic calcification. There is no retroperitoneal or periportal lymphadenopathy. No pelvic lymphadenopathy. Reproductive: Uterus and adnexa unremarkable. Other: No free fluid. Musculoskeletal: No aggressive osseous lesion. Review of the MIP images confirms the above findings. IMPRESSION: CHEST: 1. Bilateral lower lobe pulmonary emboli. Mild clot burden. No  evidence of RIGHT ventricular strain. 2. Consolidation in the RIGHT lower lobe is favored pneumonia. 3. Moderate size RIGHT effusion. Small LEFT effusion. PELVIS: 1. No acute findings in the abdomen pelvis. 2. Atrophic LEFT kidney. 3.  Aortic Atherosclerosis (ICD10-I70.0). Electronically Signed   By: Genevive Bi M.D.   On: 06/07/2023 17:43   CT ABDOMEN PELVIS W CONTRAST Result Date: 06/07/2023 CLINICAL DATA:  Concern for pulmonary embolism. Short of breath. RIGHT leg pain. Productive cough EXAM: CT ANGIOGRAPHY CHEST CT ABDOMEN AND PELVIS WITH CONTRAST TECHNIQUE: Multidetector CT imaging of the chest was performed using the standard protocol during bolus administration of intravenous contrast. Multiplanar CT image reconstructions and MIPs were obtained to evaluate the vascular anatomy. Multidetector CT imaging of the abdomen and pelvis was performed using the standard protocol during bolus administration of intravenous contrast. RADIATION DOSE REDUCTION: This exam was performed according to the departmental dose-optimization program which includes automated exposure control, adjustment of the mA and/or kV according to patient size and/or use of iterative reconstruction technique. CONTRAST:  75mL OMNIPAQUE IOHEXOL 350 MG/ML SOLN COMPARISON:  None Available. FINDINGS: CTA CHEST FINDINGS Cardiovascular: There is filling defect within subsegmental branches of the RIGHT lower lobe (image 81 through series 4). These distal segmental filling defects are occlusive. Probable filling defect within the medial branch of the LEFT lobe pulmonary artery (image 89/series 4. The overall clot burden is mild.  No evidence of RIGHT ventricular strain. Upper lung lobes are clear. Mediastinum/Nodes: No axillary or supraclavicular adenopathy. No mediastinal or hilar adenopathy. No pericardial fluid. Esophagus normal. Lungs/Pleura: There is consolidation in the posterior RIGHT lower lobe. There is a moderate size RIGHT effusion. The  volume of consolidation appears larger thanexpected from a pulmonary infarction. Favor pneumonia. Mild consolidation atelectasis in the LEFT lower lobe. Small LEFT effusion. Musculoskeletal: No aggressive osseous lesion. Review of the MIP images confirms the above findings. CT ABDOMEN and PELVIS FINDINGS Hepatobiliary: No focal hepatic lesion. Postcholecystectomy. No biliary dilatation. Pancreas: Pancreas is normal. No ductal dilatation. No pancreatic inflammation. Spleen: Normal spleen Adrenals/urinary tract: Adrenal glands normal. The LEFT kidney is atrophic. Small calcification in the RIGHT kidney. Bilateral low-density renal cysts. Ureters and bladder normal. Stomach/Bowel: The stomach, duodenum, and small bowel normal. Multiple diverticula of the descending colon and sigmoid colon without acute inflammation. Vascular/Lymphatic: Abdominal aorta is normal caliber with atherosclerotic calcification. There is no retroperitoneal or periportal lymphadenopathy. No pelvic lymphadenopathy. Reproductive: Uterus and adnexa unremarkable. Other: No free fluid. Musculoskeletal: No aggressive osseous lesion. Review of the MIP images confirms the above findings. IMPRESSION: CHEST: 1. Bilateral lower lobe pulmonary emboli. Mild clot burden. No evidence of RIGHT ventricular strain. 2. Consolidation in the RIGHT lower lobe is favored pneumonia. 3. Moderate size RIGHT effusion. Small LEFT effusion. PELVIS: 1. No acute findings in the abdomen pelvis. 2. Atrophic LEFT kidney. 3.  Aortic Atherosclerosis (ICD10-I70.0). Electronically Signed   By: Genevive Bi M.D.   On: 06/07/2023 17:43   DG Knee 2 Views Right Result Date: 06/07/2023 CLINICAL DATA:  Larey Seat, right leg pain EXAM: RIGHT KNEE - 1-2 VIEW COMPARISON:  07/07/2019 FINDINGS: Frontal and lateral views of the right knee are obtained. Partial visualization of intramedullary rod within the femur and tibia. No evidence of acute fracture, subluxation, or dislocation. There is  mild osteoarthritis of the medial and patellofemoral compartments. No joint effusion. Mild diffuse subcutaneous edema. IMPRESSION: 1. Mild osteoarthritis. 2. No acute fracture. 3. Mild diffuse subcutaneous edema. Electronically Signed   By: Sharlet Salina M.D.   On: 06/07/2023 15:17   DG Hip Unilat W or Wo Pelvis 2-3 Views Right Result Date: 06/07/2023 CLINICAL DATA:  Larey Seat, right leg pain EXAM: DG HIP (WITH OR WITHOUT PELVIS) 2-3V RIGHT COMPARISON:  08/20/2009 FINDINGS: Frontal view of the pelvis as well as frontal and cross-table lateral views of the right hip are obtained. There is a comminuted intertrochanteric right hip fracture, with impaction and varus angulation at the fracture site. No dislocation. Mild symmetrical bilateral hip osteoarthritis. The remainder of the bony pelvis is unremarkable. IMPRESSION: 1. Comminuted intertrochanteric right hip fracture, with impaction and varus angulation. Electronically Signed   By: Sharlet Salina M.D.   On: 06/07/2023 15:14   DG Chest 1 View Result Date: 06/07/2023 CLINICAL DATA:  Short of breath, right leg pain, fell 3 days ago, productive cough EXAM: CHEST  1 VIEW COMPARISON:  11/26/2018 FINDINGS: Single frontal view of the chest demonstrates marked enlargement of the cardiac silhouette. Aortic valve prosthesis is noted. There is increased pulmonary vascular congestion. Veiling opacities at the lung bases, right greater than left, consistent with consolidation and/or effusions. No pneumothorax. No acute bony abnormalities. IMPRESSION: 1. Marked enlargement of the cardiac silhouette. 2. Pulmonary vascular congestion, with bibasilar veiling opacities consistent with consolidation and/or effusions. Findings could reflect sequela of congestive heart failure. Electronically Signed   By: Sharlet Salina M.D.   On: 06/07/2023 15:13    Review  of Systems  Constitutional:  Positive for fatigue.  HENT: Negative.    Eyes: Negative.   Respiratory:  Positive for cough  and shortness of breath.   Gastrointestinal: Negative.   Endocrine: Negative.   Genitourinary: Negative.   Musculoskeletal:  Positive for gait problem.       Right hip pain  Skin: Negative.   Allergic/Immunologic: Negative.   Hematological: Negative.   Psychiatric/Behavioral: Negative.     Blood pressure 94/65, pulse 97, temperature 97.7 F (36.5 C), temperature source Axillary, resp. rate 13, height 5\' 5"  (1.651 m), weight 62.7 kg, SpO2 92%. Physical Exam Constitutional:      Appearance: She is well-developed.  HENT:     Head: Normocephalic and atraumatic.  Eyes:     Pupils: Pupils are equal, round, and reactive to light.  Musculoskeletal:     Cervical back: Normal range of motion and neck supple.     Right lower leg: Tenderness present.     Left lower leg: No tenderness. No edema.     Comments: Sever pain with any movement of right leg at hip joint.  Intact pulses in bilateral lower extremities.  PF/DF intact.    Skin:    General: Skin is warm and dry.  Neurological:     General: No focal deficit present.     Mental Status: She is alert.  Psychiatric:        Mood and Affect: Mood normal.        Behavior: Behavior normal.     Assessment/Plan: Comminuted intertrochanteric right hip fracture, with impaction and varus angulation on imaging   Given her multiple comorbidities and issues including bilateral PE, surgical intervention will be delayed until clearance from medicine service.  Order placed for bucks traction.  Plan to proceed with ORIF of right hip fracture with dynamic hip screw if possible.  Pt will need SNF placement after surgery.  Pain management per medicine service.She is non-weight bearing at this time  Dannielle Burn 06/08/2023, 9:33 AM

## 2023-06-08 NOTE — Hospital Course (Signed)
 73yo with h/o CAD, HTN, HLD, PAD, pulmonary HTN, B renal artery stenosis, and s/p TAVR who presented on 3/15 with SOB and R leg pain.  She was found to have a comminuted intertrochanteric R hip fracture (ortho consulted but she may not be ready for intervention for some time), acute B PE (no heart strain on CT, echo pending), acute on chronic diastolic CHF (likely the inciting event), possible RLL PNA (lower suspicion, started on empiric Ceftriaxone/Azithromycin x 5 days), and AKI.

## 2023-06-08 NOTE — Progress Notes (Addendum)
 Progress Note   Patient: Alexandra [name] ZOX:[id number] DOB: [birth year] DOA: 06/07/2023     1 DOS: the patient was seen and examined on 06/08/2023   Brief hospital course: 73yo with h/o CAD, HTN, HLD, PAD, pulmonary HTN, B renal artery stenosis, and s/p TAVR who presented on 3/15 with SOB and R leg pain.  She was found to have a comminuted intertrochanteric R hip fracture (ortho consulted but she may not be ready for intervention for some time), acute B PE (no heart strain on CT, echo pending), acute on chronic diastolic CHF (likely the inciting event), possible RLL PNA (lower suspicion, started on empiric Ceftriaxone/Azithromycin x 5 days), and AKI.  Assessment and Plan:  Hip fracture Apparently mechanical fall resulting in hip fracture Comminuted intertrochanteric right hip fracture, with impaction and varus angulation on imaging Orthopedics consulted Hip fracture repair will be delayed in the setting of acute PE She will be placed in Buck's traction for now Last dose of Plavix was 3/14 AM  Heparin drip (see below) Pain control with Tylenol, Robaxin, Oxycodone, and Dilaudid prn TOC team consult for rehab placement (prefers [facility]) Will need PT consult post-operatively Hip fracture order set utilized TXA per orthopedics Fascia iliacus block ordered per anesthesia   Pre-operative stratification Orthopedic/spinal surgery is associated with an intermediate (1-5%) cardiovascular risk for cardiac death and nonfatal MI With her CHF and CAD, her revised cardiac index gives a risk estimate of 10.1% Because of this risk, she is recommended to have pre-operative EKG testing prior to surgery; this was done in the ER Her Detsky's Modified Cardiac Risk Index score is 25, with a 20% cardiac risk She will need cardiology clearance prior to going to the OR   Acute B PE Patient without prior episodes of thromboembolic disease presenting with new BLL PE Patient is not showing evidence of hemodynamic  instability at this time R heart strain was not seen on CT Initiate anticoagulation - for now, will start treatment-dose heparin   O2 as needed Smoking cessation has been strongly encouraged DVT US ordered and negative    Acute on chronic combined CHF Patient with known h/o chronic diastolic CHF presenting with worsening SOB and hypoxia 07/2022 echo with preserved EF, mild RA dilation indicating diastolic dysfunction with moderate MR and mild-moderate MS; s/p TAVR and aortic valve is stable Echo now with EF 35-40%, global hypokinesis - ?Takotsubo; also with severe pulmonary HTN and severe biatrial dilation, moderate-severe MR and TR CXR consistent with mild pulmonary edema Markedly elevated BNP CHF order set utilized Was given Lasix 80 mg x 1 in ER and will hold for now and closely monitor (unless cardiology feels differently) Elevated HS troponin is likely related to demand ischemia; doubt ACS based on symptoms and repeat troponin is flat Will place ted hose  New onset atrial fibrillation Appreciated this AM; EKG yesterday read as sinus tachycardia but may also be c/w afib Currently rate controlled with bisoprolol Continue heparin drip for now   RLL PNA Dry cough, SOB Chest CT read as probable RLL PNA She has other reasons for SOB including CHF exacerbation and B PE However, for now will continue Rocephin and Azithromycin with plan for 5 days of treatment Also with moderate R pleural effusion, possibly related to infection but equally likely from CHF; will monitor with diuresis   AKI on stage 3a CKD Patient with mild baseline compromised renal function, likely stage 2-3a based on brief review of records She does have an atrophic L kidney  with apparent loss of blood flow (chronically) Current creatinine is >0.3 within 48 hours or increased at least 1.5 times compared to baseline and presumed to have occurred within the last 7 days There is likely a prerenal component secondary to  dehydration in the setting of poor PO intake since her fall 2 days PTA However, she is frankly volume overloaded and so diminished renal perfusion in the setting of volume overload is equally likely if not more She was given 1 dose of 80 mg IV Lasix in the ER Creatinine slightly improved today Avoid ACEI and NSAIDs  If worsening, she may need nephrology consultation Continue to hold both IVF and diuretics unless cardiology feels differently   Vasculopathy/CAD/PVD Patient has significant underlying vascular disease requiring multiple prior interventions She has carotid US at cardiology on 3/12 with mild R ICA stenosis and moderate (40-59%) L ICA stenosis with L retrograde flow and disruption of B subclavian artery flow Also with renal artery US showing occlusion and atrophy as well as a possibly avascular septated cystic mass at the lower pole of the left kidney All of these studies will need follow up soon but are apparently unrelated to presenting complaints Holding ASA and Plavix for now   HTN Continue bisoprolol Hold hydrochlorothiazide, losartan Will also add prn hydralazine   HLD Continue rosuvastatin   GERD Continue PPI   Chronic pain I have reviewed this patient in the [place] Controlled Substances Reporting System.  She is receiving medications from only one provider and appears to be taking them as prescribed. She is not at particularly high risk of opioid misuse, diversion, or overdose.  Continue oxycodone (not Percocet) 10 mg 5 times daily Add prn Dilaudid 0.5 mg q2h prn breakthrough pain (plus Narcan) Hold at bedtime cyclobenzaprine   Tobacco dependence Encourage cessation.   This was discussed with the family and should be reviewed on an ongoing basis - although family reports that she will "never" quit smoking Patch ordered      Consultants: Cardiology Orthopedics  Procedures: Echocardiogram  Antibiotics: Ceftriaxone 3/15-20 Azithromycin 3/15-20  30 Day  Unplanned Readmission Risk Score    Flowsheet Row ED to Hosp-Admission (Current) from 06/07/2023 in [facility] 3E HF PCU  30 Day Unplanned Readmission Risk Score (%) 16.47 Filed at 06/08/2023 0401       This score is the patient's risk of an unplanned readmission within 30 days of being discharged (0 -100%). The score is based on dignosis, age, lab data, medications, orders, and past utilization.   Low:  0-14.9   Medium: 15-21.9   High: 22-29.9   Extreme: 30 and above           Subjective: Feeling some better today.  Still mildly confused (family denies this as baseline).  Breathing is improved.   Objective: Vitals:   06/08/23 1004 06/08/23 1137  BP:  105/66  Pulse:  86  Resp:  16  Temp:  97.7 F (36.5 C)  SpO2: 97% 98%    Intake/Output Summary (Last 24 hours) at 06/08/2023 1250 Last data filed at 06/08/2023 0730 Gross per 24 hour  Intake 732.72 ml  Output 2375 ml  Net -1642.28 ml   Filed Weights   06/07/23 1915 06/08/23 0356 06/08/23 0501  Weight: 62.7 kg 62.7 kg 62.7 kg    Exam:  General:  Appears better today; hip pain with movement; on [place] O2 Eyes:   EOMI, normal lids, iris ENT: hard of hearing,  grossly normal lips & tongue, mmm;  artificial dentition Neck:  no LAD, masses or thyromegaly Cardiovascular:  Irregularly irregular with mild tachycardia. 2-3+ pitting LE edema.  Respiratory:   Improved air movement.  Mildly increased respiratory effort.   Abdomen:  soft, NT, ND Skin:  no rash or induration seen on limited exam Musculoskeletal:  R leg is shortened and externally rotated Psychiatric:  blunted mood and affect, speech sparse but appropriate Neurologic:  CN 2-12 grossly intact, moves all extremities in coordinated fashion other than RLE   Data Reviewed: I have reviewed the patient's lab results since admission.  Pertinent labs for today include:   K+ 3.4 CO2 20, improved Glucose 69, 85 BUN 44/Creatinine 1.7/GFR 31, 41/1.78/30 on  presentation WBC 19.9 Hgb 9.9, stable UA: large Hgb, moderate LE< 100 protein, many bacteria Blood cultures pending    Family Communication: DIL was present throughout evaluation  Disposition: Status is: Inpatient Remains inpatient appropriate because: seriously ill, needs eventual hip fracture repair     Time spent: 50 minutes  Unresulted Labs (From admission, onward)     Start     Ordered    0500  Heparin level (unfractionated)  Daily,   R      06/07/23 1818   06/08/23 1200  Heparin level (unfractionated)  Once-Timed,   TIMED        06/08/23 0352   06/08/23 0500  Basic metabolic panel  Daily,   R     Comments: As Scheduled for 5 days    06/07/23 1949   06/08/23 0500  CBC with Differential/Platelet  Daily,   R      06/07/23 1949   06/07/23 1943  Type and screen [facility]  Once,   R       Comments: [facility]    06/07/23 1949             Author: Jonah Blue, MD 06/08/2023 12:50 PM  For on call review [email or web address].

## 2023-06-08 NOTE — Progress Notes (Signed)
 ANTICOAGULATION CONSULT NOTE  Pharmacy Consult for Heparin Indication: pulmonary embolus  Allergies  Allergen Reactions   Codeine Nausea Only   Symproic [Naldemedine] Nausea And Vomiting    Patient Measurements: Height: 5\' 5"  (165.1 cm) Weight: 62.7 kg (138 lb 3.7 oz) IBW/kg (Calculated) : 57 Heparin Dosing Weight: 65.8 kg  Vital Signs: Temp: 97.7 F (36.5 C) (03/16 1137) Temp Source: Oral (03/16 1137) BP: 105/66 (03/16 1137) Pulse Rate: 86 (03/16 1137)  Labs: Recent Labs    06/07/23 1415 06/07/23 1428 06/07/23 1733 06/08/23 0232 06/08/23 1156  HGB 9.9* 11.9*  --  9.9*  --   HCT 32.9* 35.0*  --  32.7*  --   PLT 205  --   --  180  --   HEPARINUNFRC  --   --   --  0.79* 0.52  CREATININE 1.78* 1.90*  --  1.70*  --   TROPONINIHS 569*  --  510*  --   --     Estimated Creatinine Clearance: 26.1 mL/min (A) (by C-G formula based on SCr of 1.7 mg/dL (H)).   Medical History: Past Medical History:  Diagnosis Date   Arthritis    COPD (chronic obstructive pulmonary disease) (HCC) 05/22/2016   Severely decreased DLCO by PFTs in April 2019 but only mild obstructive disease.    Coronary artery disease, non-occlusive 11/28/2016   R&LHC: 65% distal LAD lesion -- likely not angiographically significant->medical management. Normal LVEF. Moderately elevated LVEDP. I aortic valve gradient in the Cath Lab, moderate aortic stenosis. This would suggest that the stenosis is on the moderate side of moderate to severe.  Severe pulmonary hypertension (likely mixed).  AVA 0.96 cm; P-P gradient ~ 30 mmHg, Mean ~24.5-27.5 (Moderate AS)    GERD (gastroesophageal reflux disease)    Heart murmur    Hx of adenomatous colonic polyps 10/19/2015   Hyperlipidemia    Hypertension    Resistant hypertension-multiple medications likely partially related to RAS.   Moderate mitral regurgitation by prior echocardiography 10/2016   Echocardiogram revealed moderate mitral regurgitation; most recent Echo  02/12/2019- MAC with moderate MR   Multiple renal cysts 06/2020   Renal A Dopplers: Exophytic Cyst noted in Upper pole of Right Kidney (1.6x1.7x1.7 cm); Exophytic cysts: lower pole of Left Kidney 1.9 x 1.7 x 1.9 cm, and 1.4 x 1.2 x 1.5 cm.   PAOD (peripheral arterial occlusive disease) (HCC) 06/2020   Abd AoGram-BLE Runoff: Severe Bilat RA stenosis - small. atretic L RA. Severe stenosis of R Com Iliac A - Ext Iliac A (PTA-Stent). Bilateral SFA flush CTO. Mod R CFA, L Iliac & CFA. Bilateral SFA reconstitution distal Pop A. R PTA flush CTO 2 V runoff. L LE 3 V runoff unitl distal LPTA CTO.   Small Infrarenal aneurysmal areas with extensive atheroma - no obstructive disease   Pulmonary hypertension associated Aortic Stenosis, Mitral Regurgitation & COPD    Peak PAP by Echo ~54 mmHg;  by Cath PAP/Mean 71/32 mmHg, 53 mmHg (with PCWP & LVEDP ~24-26 mmHg); TPG ~26 mmHg   Pulmonary nodules    Renal artery stenosis, native, bilateral (HCC) 06/2020   Abd Ao Gram: LRA ~ 99% ostial stenosis with small atretic L Kidney (f/u dopplers - Abnormal L kidney size & Resistive Index); R RA ostial 80-90% stenosis (confirmed by doppler Resistive Index, but normal R kidney size.   Renal artery stenosis, native, bilateral (HCC) 06/2020   Abdominal aortic angiogram revealed bilateral renal artery stenosis, however Dopplers confirmed although the left renal artery  was stenosed, the kidney itself was atrophied. => R Renal A Stent 05/2013 (Dr. Kirke Corin)   S/P TAVR (transcatheter aortic valve replacement) 12/30/2017   Edward's Sapien 3 bioprosthetic THB via TF approach   Severe aortic stenosis 2018   Underwent TAVR in 2019   Superior mesenteric artery stenosis (HCC) 06/2020   Abdominal Ao Dopplers - 70-90% Ostial SMA stenosis with normal Celiac A.    Medications:  Medications Prior to Admission  Medication Sig Dispense Refill Last Dose/Taking   fluticasone-salmeterol (ADVAIR) 100-50 MCG/ACT AEPB Inhale 1 puff into the lungs 2  (two) times daily.   Taking   albuterol (VENTOLIN HFA) 108 (90 Base) MCG/ACT inhaler Inhale 2 puffs into the lungs every 4 (four) hours as needed.      alendronate (FOSAMAX) 70 MG tablet Take 70 mg by mouth once a week.      aspirin 81 MG tablet Take 1 tablet (81 mg total) by mouth 2 (two) times daily at 10 AM and 5 PM. (Patient taking differently: Take 81 mg by mouth 3 (three) times a week.) 30 tablet 0    bisoprolol-hydrochlorothiazide (ZIAC) 10-6.25 MG tablet TAKE 1 TABLET BY MOUTH DAILY 90 tablet 0    clopidogrel (PLAVIX) 75 MG tablet TAKE 1 TABLET(75 MG) BY MOUTH DAILY (Patient taking differently: Take 75 mg by mouth daily.) 90 tablet 0    cyclobenzaprine (FLEXERIL) 10 MG tablet Take 10 mg by mouth at bedtime.      esomeprazole (NEXIUM) 40 MG capsule TAKE 1 CAPSULE BY MOUTH DAILY AT NOON (Patient taking differently: Take 40 mg by mouth daily at 12 noon.) 90 capsule 3    furosemide (LASIX) 20 MG tablet TAKE 1 TABLET BY MOUTH DAILY AS NEEDED. INCREASE SWELLING (Patient taking differently: Take 20 mg by mouth as needed for fluid.) 30 tablet 6    losartan (COZAAR) 100 MG tablet TAKE 1 TABLET(100 MG) BY MOUTH DAILY (Patient taking differently: Take 100 mg by mouth daily.) 90 tablet 2    oxyCODONE-acetaminophen (PERCOCET) 10-325 MG tablet Take 1 tablet by mouth 5 (five) times daily.      potassium chloride SA (KLOR-CON M) 20 MEQ tablet Take 1 tablet (20 mEq total) by mouth daily. KEEP OV. 30 tablet 1    rosuvastatin (CRESTOR) 20 MG tablet TAKE 1 TABLET(20 MG) BY MOUTH DAILY (Patient taking differently: Take 20 mg by mouth daily.) 90 tablet 3    tiZANidine (ZANAFLEX) 4 MG tablet Take 4 mg by mouth at bedtime as needed for muscle spasms.      Scheduled:   amiodarone  150 mg Intravenous Once   bisoprolol  10 mg Oral Daily   Chlorhexidine Gluconate Cloth  6 each Topical Daily   docusate sodium  100 mg Oral BID   feeding supplement  237 mL Oral BID BM   oxyCODONE  10 mg Oral Q4H   pantoprazole  40 mg  Oral Daily   rosuvastatin  20 mg Oral Daily   Infusions:   amiodarone     Followed by   amiodarone     azithromycin Stopped (06/07/23 2209)   cefTRIAXone (ROCEPHIN)  IV     heparin 1,100 Units/hr (06/08/23 1259)   PRN: acetaminophen, bisacodyl, hydrALAZINE, HYDROmorphone (DILAUDID) injection, methocarbamol **OR** methocarbamol (ROBAXIN) injection, ondansetron (ZOFRAN) IV, polyethylene glycol  Assessment: 79 yof with a history of PAD, SMA stenosis, CAD, COPD, OS, GERD, HLD, HTN, Pulmonary HTN, CHF, paroxysmal atrial tachycardia . Patient is presenting with SOB and leg pain. Heparin per pharmacy consult  placed for pulmonary embolus.  CTA w/ bilateral PE without evidence for RHS  Patient is not on anticoagulation prior to arrival.  Heparin level 0.52 is therapeutic with heparin running at 1100 units/hr. Hgb (9.9) and PLTs (180) are have decreased slightly. Per RN, no report of pauses, issues with the line, or signs of bleeding.    Goal of Therapy:  Heparin level 0.3-0.7 units/ml Monitor platelets by anticoagulation protocol: Yes   Plan:  Continue heparin infusion at 1100 units/hr Check 8 hour confirmatory heparin level Monitor daily heparin level, CBC, and signs/symptoms of bleeding F/u plan to transition to PO anticoagulation  Ernestene Kiel, PharmD PGY1 Pharmacy Resident  Please check AMION for all [facility] Pharmacy phone numbers After 10:00 PM, call Main Pharmacy [phone] 06/08/2023 1:42 PM

## 2023-06-08 NOTE — Progress Notes (Signed)
 VASCULAR LAB    Bilateral lower extremity venous duplex has been performed.  See CV proc for preliminary results.   Otto Caraway, RVT 06/08/2023, 8:52 AM

## 2023-06-08 NOTE — Progress Notes (Signed)
 Initial Nutrition Assessment  DOCUMENTATION CODES:   Not applicable  INTERVENTION:  Multivitamin w/ minerals daily Ensure Enlive po BID, each supplement provides 350 kcal and 20 grams of protein. Liberalize diet to regular due to increased needs for healing. Encourage good PO intake   NUTRITION DIAGNOSIS:   Increased nutrient needs related to acute illness, chronic illness as evidenced by estimated needs.  GOAL:   Patient will meet greater than or equal to 90% of their needs  MONITOR:   PO intake, Supplement acceptance, Weight trends, I & O's  REASON FOR ASSESSMENT:   Consult Hip fracture protocol  ASSESSMENT:   73 y.o. female presented to the ED with SOB and R leg pain, pt fell 3 days prior. PMH includes COPD, CAD, HTN. HLD, PAD, and GERD. Pt admitted with a hip fracture, acute BLE PE, PNA, CHF exacerbation, and AKI on CKD.   RD working remotely at time of assessment. Per MD notes, pt will require surgical repair of R hip once cleared by other medical services due to multiple comorbidities.   RD attempted to reach pt via phone in room, pt unavailable. RD to add supplements and liberalize diet to encourage better PO intake.   Per EMR, pt with an 11.2% weight loss within the past month. Although, noted in RN assessment, pt with ongoing pitting edema.   Meal Intake 3/16: 25% x 1 meal  Medications reviewed and include: Colace, Protonix, IV antibiotics  Labs reviewed: Sodium 140, Potassium 3.4, BUN 44, Creatinine 1.70   NUTRITION - FOCUSED PHYSICAL EXAM: Deferred to follow-up.  Diet Order:   Diet Order             Diet regular Room service appropriate? Yes with Assist; Fluid consistency: Thin  Diet effective now                   EDUCATION NEEDS:   No education needs have been identified at this time  Skin:  Skin Assessment: Reviewed RN Assessment  Last BM:  PTA  Height:   Ht Readings from Last 1 Encounters:  06/07/23 5\' 5"  (1.651 m)    Weight:    Wt Readings from Last 1 Encounters:  06/08/23 62.7 kg    Ideal Body Weight:  56.8 kg  BMI:  Body mass index is 23 kg/m.  Estimated Nutritional Needs:  Kcal:  1600-1800 Protein:  80-100 grams Fluid:  >/= 1.6 L   Kirby Crigler RD, LDN Clinical Dietitian

## 2023-06-08 NOTE — Progress Notes (Signed)
 Orthopedic Tech Progress Note Patient Details:  Alexandra [name] 1951/03/24 [id number]  10lbs of Bucks traction applied to RLE. Holli, Alexandra [name] is aware of ensuring the weights are not resting on the floor and to secure them during transport.  Musculoskeletal Traction Type of Traction: Bucks Skin Traction Traction Location: RLE Traction Weight: 10 lbs   Post Interventions Patient Tolerated: Well Instructions Provided: Care of device  Alexandra [name] Alexandra [name] 06/08/2023, 11:57 AM

## 2023-06-08 NOTE — Progress Notes (Signed)
 Called about low BP concurrent with his amio; May be related to the drug-- may need volume

## 2023-06-08 NOTE — Progress Notes (Signed)
 Echocardiogram 2D Echocardiogram has been performed.  Warren Lacy Tyquez Hollibaugh RDCS 06/08/2023, 9:01 AM

## 2023-06-08 NOTE — Progress Notes (Signed)
 Orthopedic Tech Progress Note Patient Details:  [name] 1950/07/06 [id number]  Patient ID: Alexandra [name], female   DOB: 1950/12/16, 73 y.o.   MRN: [id number] The patient doesn't meet the criteria for the ohf. Patient must be under 70 to get an ohf. Trinna Post 06/08/2023, 5:56 AM

## 2023-06-08 NOTE — Consult Note (Signed)
 Cardiology Consultation   Patient ID: Alexandra [name] MRN: [id number]; DOB: [birth year]  Admit date: 06/07/2023 Date of Consult: 06/08/2023  PCP:  [facility]   [facility] Providers Cardiologist:  Bryan Lemma, MD  PV Cardiologist:  Lorine Bears, MD  {    Patient Profile:   Alexandra [name] is a 73 y.o. female with a history of non-obstructive coronary artery disease, renal artery stenosis status post stent to right renal artery, lower extremity peripheral arterial disease status post stent placement to right common iliac artery, aortic valve stenosis status post TAVR, probable diastolic heart failure, COPD, hypercholesteremia, hypertension, and likely pulmonary hypertension who is being seen 06/08/2023 for the evaluation of elevated HS-troponin and  at the request of Dr. Ophelia Charter. Since this note was started interval diagnosis of pulmonary embolism as noted below with concern of concomitant pneumonia.  Now on antibiotics..  Also with a fractured hip for which surgery is anticipated  She reports that she was at home fell on Thursday came in yesterday.  Also describes cardiac stenting which is discordant from the medical record      Past Medical History:  Diagnosis Date   Arthritis    COPD (chronic obstructive pulmonary disease) (HCC) 05/22/2016   Severely decreased DLCO by PFTs in April 2019 but only mild obstructive disease.    Coronary artery disease, non-occlusive 11/28/2016   R&LHC: 65% distal LAD lesion -- likely not angiographically significant->medical management. Normal LVEF. Moderately elevated LVEDP. I aortic valve gradient in the Cath Lab, moderate aortic stenosis. This would suggest that the stenosis is on the moderate side of moderate to severe.  Severe pulmonary hypertension (likely mixed).  AVA 0.96 cm; P-P gradient ~ 30 mmHg, Mean ~24.5-27.5 (Moderate AS)    GERD (gastroesophageal reflux disease)    Heart murmur    Hx of  adenomatous colonic polyps 10/19/2015   Hyperlipidemia    Hypertension    Resistant hypertension-multiple medications likely partially related to RAS.   Moderate mitral regurgitation by prior echocardiography 10/2016   Echocardiogram revealed moderate mitral regurgitation; most recent Echo 02/12/2019- MAC with moderate MR   Multiple renal cysts 06/2020   Renal A Dopplers: Exophytic Cyst noted in Upper pole of Right Kidney (1.6x1.7x1.7 cm); Exophytic cysts: lower pole of Left Kidney 1.9 x 1.7 x 1.9 cm, and 1.4 x 1.2 x 1.5 cm.   PAOD (peripheral arterial occlusive disease) (HCC) 06/2020   Abd AoGram-BLE Runoff: Severe Bilat RA stenosis - small. atretic L RA. Severe stenosis of R Com Iliac A - Ext Iliac A (PTA-Stent). Bilateral SFA flush CTO. Mod R CFA, L Iliac & CFA. Bilateral SFA reconstitution distal Pop A. R PTA flush CTO 2 V runoff. L LE 3 V runoff unitl distal LPTA CTO.   Small Infrarenal aneurysmal areas with extensive atheroma - no obstructive disease   Pulmonary hypertension associated Aortic Stenosis, Mitral Regurgitation & COPD    Peak PAP by Echo ~54 mmHg;  by Cath PAP/Mean 71/32 mmHg, 53 mmHg (with PCWP & LVEDP ~24-26 mmHg); TPG ~26 mmHg   Pulmonary nodules    Renal artery stenosis, native, bilateral (HCC) 06/2020   Abd Ao Gram: LRA ~ 99% ostial stenosis with small atretic L Kidney (f/u dopplers - Abnormal L kidney size & Resistive Index); R RA ostial 80-90% stenosis (confirmed by doppler Resistive Index, but normal R kidney size.   Renal artery stenosis, native, bilateral (HCC) 06/2020   Abdominal aortic angiogram revealed bilateral renal  artery stenosis, however Dopplers confirmed although the left renal artery was stenosed, the kidney itself was atrophied. => R Renal A Stent 05/2013 (Dr. Kirke Corin)   S/P TAVR (transcatheter aortic valve replacement) 12/30/2017   Edward's Sapien 3 bioprosthetic THB via TF approach   Severe aortic stenosis 2018   Underwent TAVR in 2019   Superior  mesenteric artery stenosis (HCC) 06/2020   Abdominal Ao Dopplers - 70-90% Ostial SMA stenosis with normal Celiac A.    Past Surgical History:  Procedure Laterality Date   ABDOMINAL AORTOGRAM W/LOWER EXTREMITY N/A 07/12/2020   Procedure: ABDOMINAL AORTOGRAM W/LOWER EXTREMITY;  Surgeon: Iran Ouch, MD;  Location: [facility] INVASIVE CV LAB;; Severe B-RA stenosis - sm/atretic L RA. Severe Dz R Com-Ext Iliac A (PTA-Stent). B-SFA flush CTO. Mod R CFA, L Iliac-CFA. B-SFA recon @ dPop A. R PTA flush CTO & 2 V runoff. L LE 3 V runoff w/ distal LPTA CTO.   Sm InfraRenal aneurysms w/ extensive atheroma - no obstructive Dz   APPENDECTOMY     BREAST CYST EXCISION Right 1970   CARDIAC VALVE REPLACEMENT     CATARACT EXTRACTION W/ INTRAOCULAR LENS IMPLANT Right    CHOLECYSTECTOMY OPEN     COLONOSCOPY  2003, 2017   CTA Chest  09/2016   Thoracic Aortic Ca2+ w/o dilation.  Coronary Calcification noted. PA normal. Mild Emphysematous changes w/ mild LLL scarring.     FRACTURE SURGERY     2007 -right tib /fib fracture ----07/2009 right femur fx after a fall   FRACTURE SURGERY Right 2011   Femur   PERIPHERAL VASCULAR CATHETERIZATION N/A 08/09/2014   Procedure: Aortic Arch Angiography;  Surgeon: Nada Libman, MD;  Location: [facility] INVASIVE CV LAB;  Service: Cardiovascular: Type 1 Arch. No significant stenosis, aneurysmal degeneration or dissection.  No luminal irregularity seen in R or L SubClavian, Brachial or Radial A. Chronic distal ulnar A occlusion Bilatera.   PERIPHERAL VASCULAR CATHETERIZATION Right 08/09/2014   Procedure: Upper Extremity Angiography;  Surgeon: Nada Libman, MD;  Location: [facility] INVASIVE CV LAB;  Service: Cardiovascular;  Laterality: Right;   PERIPHERAL VASCULAR INTERVENTION  07/12/2020   Procedure: PERIPHERAL VASCULAR INTERVENTION;  Surgeon: Iran Ouch, MD;  Location: [facility] INVASIVE CV LAB;  Service: Cardiovascular;;  R Common-External Iliac A PTA & Stent   RENAL ANGIOGRAPHY N/A  06/06/2021   Procedure: RENAL ANGIOGRAPHY;  Surgeon: Iran Ouch, MD;  Location: [facility] INVASIVE CV LAB;  Service: Cardiovascular; severe 90% ostial R Renal A stenosis (also noted was severe L Rena A stenosis, but known L kidney atrophy) => Stent R Renal A   RENAL INTERVENTION  06/06/2021   Procedure: RENAL INTERVENTION;  Surgeon: Iran Ouch, MD;  Location: [facility] INVASIVE CV LAB;  Service: Cardiovascular;;  Successful stent (Herculink 6.5 mm x 15 mm) placement to the right renal artery via the left radial artery approach.   RIGHT/LEFT HEART CATH AND CORONARY ANGIOGRAPHY N/A 12/06/2016   Procedure: RIGHT/LEFT HEART CATH AND CORONARY ANGIOGRAPHY;  Surgeon: Marykay Lex, MD;  Location: [facility] INVASIVE CV LAB: Cor Angio: 65% dLAD (Med Rx). AVA 0.96 cm; P-P gradient ~ 30 mmHg, Mean ~24.5-27.5 (Mod AS); RHC #s: RAP 8 mmHg, RVP/EDP: 71/8/15 mmHg, PCWP: 21-24 mmHg, PAP/mean: 71/32/52 mmHg = Severe Mixed Pulmonary HTN; LVP/EDP 205/17/26 mmHg ; CO/CI by Fick: 4.35, 2.44 - mildly reduced   RIGHT/LEFT HEART CATH AND CORONARY ANGIOGRAPHY N/A 11/20/2017   Procedure: RIGHT/LEFT HEART CATH AND CORONARY ANGIOGRAPHY;  Surgeon: Laurey Morale, MD;  Location: [facility] INVASIVE CV LAB;  Service: Cardiovascular;  Laterality: N/A;   TEE WITHOUT CARDIOVERSION N/A 12/30/2017   Procedure: (Intra-Op) TRANSESOPHAGEAL ECHOCARDIOGRAM (TEE);  Surgeon: Kathleene Hazel, MD;  Location: [facility] OR;  Service: Open Heart Surgery; Post TAVR good position of 23 mm AP and 3 valve.  No new or WMA.  EF 55-60%.  No effusion.  Mild perivalvular regurgitation seen.  Mean gradient 7 mmHg.  Peak 16 mmHg.   TOTAL KNEE ARTHROPLASTY Left 12/01/2018   Procedure: Left Knee Arthroplasty;  Surgeon: Marcene Corning, MD;  Location: [facility] ORS;  Service: Orthopedics;  Laterality: Left;   TRANSCATHETER AORTIC VALVE REPLACEMENT, TRANSFEMORAL N/A 12/30/2017   Procedure: TRANSCATHETER AORTIC VALVE REPLACEMENT, TRANSFEMORAL;  Surgeon: Kathleene Hazel,  MD;  Location: [facility] OR;  Service: Open Heart Surgery;  Laterality: N/A; 23 mm Edwards SAPIEN 3 THV   TRANSTHORACIC ECHOCARDIOGRAM  10/30/2016   Mild Concentric LVH. EF 55-60%. No RWMA, Gr 2 DD. Mod-Severe AS (mean-peak Gradient 31 mmHG - 64 mmHg), Mod MR. Mod LA dilation, Mod PA HTN - peak pressure ~54 mmHg). = Progression of AS from 2014   TRANSTHORACIC ECHOCARDIOGRAM  01/2019   EF 55 to 60%.  Elevated LVEDP.  GR 1 DD.  No R WMA.  Moderate LA dilation.  Well-seated stented bioprosthetic aortic valve.  Mean gradient 13 mercury.  Moderate MAC moderate MR.  No evidence of mildly elevated RAP/CVP and moderately elevated PA P   TRANSTHORACIC ECHOCARDIOGRAM  10/2017   a) Pre-TAVR 10/31/17: EF 55 to 60%.  GR 1 DD.  Calcific severe AS.  Peak Grad 62 mmHg, mean 32 mmHg.  Progression of Dz.  AVA ~0.8 cm.; b) Post TAVR 10/9/'19: EF 60-65%.  GR 1 DD.  Stable appearing 23 mm Edwards's Sapien AV bioprosthesis.  No perivalvular AI.  Mean Grad 22 mmHg; c) 11/13/'19: AoV mean Grad 19 mmHg,, Peak 36 mmHg. PAP ~33 mmHg   TRANSTHORACIC ECHOCARDIOGRAM  08/02/2020   EF 60 to 65%.  GR 1 DD.  Elevated LAP/LVEDP.  Mildly elevated PAP.  Mild LA dilation.  23 mm SAPIEN 3 prosthetic TAVR valve present.  Mild perivalvular leak.  Mean gradient 16 mmHg. Mod MR.   ULTRASOUND GUIDANCE FOR VASCULAR ACCESS  08/09/2014   Procedure: Ultrasound Guidance For Vascular Access;  Surgeon: Nada Libman, MD;  Location: [facility] INVASIVE CV LAB;  Service: Cardiovascular;;       Inpatient Medications: Scheduled Meds:  bisoprolol  10 mg Oral Daily   docusate sodium  100 mg Oral BID   feeding supplement  237 mL Oral BID BM   oxyCODONE  10 mg Oral Q4H   pantoprazole  40 mg Oral Daily   rosuvastatin  20 mg Oral Daily   Continuous Infusions:  azithromycin Stopped (06/07/23 2209)   cefTRIAXone (ROCEPHIN)  IV     heparin 1,100 Units/hr (06/08/23 0359)   PRN Meds: acetaminophen, bisacodyl, hydrALAZINE, HYDROmorphone (DILAUDID) injection,  methocarbamol **OR** methocarbamol (ROBAXIN) injection, ondansetron (ZOFRAN) IV, polyethylene glycol  Allergies:    Allergies  Allergen Reactions   Codeine Nausea Only   Symproic [Naldemedine] Nausea And Vomiting    Social History:   Social History   Socioeconomic History   Marital status: Married    Spouse name: Jory Ee Cassel   Number of children: 3   Years of education: 12th grade   Highest education level: Not on file  Occupational History   Occupation: retired English as a second language teacher    Comment: previously self-employed, now relies on [employer]  Tobacco  Use   Smoking status: Light Smoker    Current packs/day: 0.10    Average packs/day: 0.1 packs/day for 40.0 years (4.0 ttl pk-yrs)    Types: Cigarettes   Smokeless tobacco: Never   Tobacco comments:    1-2 cigarettes a day  Vaping Use   Vaping status: Never Used  Substance and Sexual Activity   Alcohol use: Not Currently    Alcohol/week: 0.0 standard drinks of alcohol   Drug use: Never    Comment: +cocaine in urine 12/19/2014; pt denies this hx on 12/30/2017   Sexual activity: Not Currently    Partners: Male  Other Topics Concern   Not on file  Social History Narrative   Lives with her husband and her middle son.   Her two older sons are products of a previous relationship.   Her youngest son is from her current marriage.   Oldest and youngest sons live nearby.   She has total of 3 children and 6 grandchildren with 2 great-grandchildren.   She does not exercise because she has pain in her right leg.   Social Drivers of Corporate investment banker Strain: Low Risk  (04/07/2023)   [facility]   Overall Financial Resource Strain (CARDIA)    Difficulty of Paying Living Expenses: Not hard at all  Food Insecurity: No Food Insecurity (06/07/2023)   Hunger Vital Sign    Worried About Running Out of Food in the Last Year: Never true    Ran Out of Food in the Last Year: Never true  Transportation Needs:  No Transportation Needs (06/07/2023)   PRAPARE - Administrator, Civil Service (Medical): No    Lack of Transportation (Non-Medical): No  Physical Activity: Unknown (07/10/2022)   Received from [facility]   Exercise Vital Sign    Days of Exercise per Week: 0 days    Minutes of Exercise per Session: Not on file  Stress: No Stress Concern Present (07/10/2022)   Received from [facility], [facility] of Occupational Health - Occupational Stress Questionnaire    Feeling of Stress : Only a little  Social Connections: Moderately Integrated (06/07/2023)   Social Connection and Isolation Panel [NHANES]    Frequency of Communication with Friends and Family: More than three times a week    Frequency of Social Gatherings with Friends and Family: Three times a week    Attends Religious Services: Never    Active Member of Clubs or Organizations: Yes    Attends Banker Meetings: Never    Marital Status: Married  Catering manager Violence: Not At Risk (06/07/2023)   Humiliation, Afraid, Rape, and Kick questionnaire    Fear of Current or Ex-Partner: No    Emotionally Abused: No    Physically Abused: No    Sexually Abused: No    Family History:     Family History  Problem Relation Age of Onset   Hypertension Mother    Heart disease Mother        She is unaware of the details   Heart disease Father        Unaware of details   Heart attack Brother    Colon cancer Neg Hx    Colon polyps Neg Hx    Esophageal cancer Neg Hx    Rectal cancer Neg Hx    Stomach cancer Neg Hx      ROS:  Please see the history of present illness.  All other ROS reviewed and negative.     Physical Exam/Data:   Vitals:   06/07/23 2130 06/07/23 2145 06/07/23 2332 06/08/23 0356  BP:   (!) 112/90   Pulse: (!) 104 (!) 102 (!) 103   Resp: (!) 31 16 20    Temp:   98 F (36.7 C)   TempSrc:   Axillary   SpO2: 100% 100% 90%   Weight:    62.7 kg   Height:        Intake/Output Summary (Last 24 hours) at 06/08/2023 0420 Last data filed at 06/08/2023 0400 Gross per 24 hour  Intake 732.72 ml  Output 1575 ml  Net -842.28 ml      06/08/2023    3:56 AM 06/07/2023    7:15 PM 06/07/2023    1:58 PM  Last 3 Weights  Weight (lbs) 138 lb 3.7 oz 138 lb 3.7 oz 145 lb  Weight (kg) 62.7 kg 62.7 kg 65.772 kg     Body mass index is 23 kg/m.  General:  Well nourished, well developed, in no acute distress  HEENT: normal Neck:  JVP 8 Vascular: No carotid bruits; Distal pulses 2+ bilaterally Cardiac:  normal S1, S2; RRR; no murmur  Lungs: Rhonchi abd: soft, nontender, no hepatomegaly  Ext: no edema Musculoskeletal:  No deformities, BUE and BLE strength normal and equal Skin: warm and dry  Neuro:  CNs 2-12 intact, no focal abnormalities noted Psych:  Normal affect   EKG:  The EKG was personally reviewed and demonstrates: Sinus at about 95 with frequent PAC Left bundle branch block-atypical with an S wave in lead I and V6 and fractionation aVL and V5  Telemetry:  Telemetry was personally reviewed and demonstrates:  sinus with frequent nonsustained but prolonged runs of atrial tachycardia  Relevant CV Studies: Repeat echo p  Laboratory Data:  High Sensitivity Troponin:   Recent Labs  Lab 06/07/23 1415 06/07/23 1733  TROPONINIHS 569* 510*     Chemistry Recent Labs  Lab 06/07/23 1415 06/07/23 1428 06/08/23 0232  NA 139 137 140  K 3.9 3.9 3.4*  CL 107 109 105  CO2 17*  --  20*  GLUCOSE 95 102* 69*  BUN 41* 39* 44*  CREATININE 1.78* 1.90* 1.70*  CALCIUM 7.9*  --  7.4*  MG 2.3  --   --   GFRNONAA 30*  --  31*  ANIONGAP 15  --  15    Recent Labs  Lab 06/07/23 1415  PROT 5.8*  ALBUMIN 2.9*  AST 179*  ALT 117*  ALKPHOS 81  BILITOT 2.9*   Lipids No results for input(s): "CHOL", "TRIG", "HDL", "LABVLDL", "LDLCALC", "CHOLHDL" in the last 168 hours.  Hematology Recent Labs  Lab 06/07/23 1415 06/07/23 1428  06/08/23 0232  WBC 15.0*  --  19.9*  RBC 4.06  --  4.08  HGB 9.9* 11.9* 9.9*  HCT 32.9* 35.0* 32.7*  MCV 81.0  --  80.1  MCH 24.4*  --  24.3*  MCHC 30.1  --  30.3  RDW 18.6*  --  18.7*  PLT 205  --  180   Thyroid No results for input(s): "TSH", "FREET4" in the last 168 hours.  BNP Recent Labs  Lab 06/07/23 1415  BNP >4,500.0*    DDimer No results for input(s): "DDIMER" in the last 168 hours.   Radiology/Studies:  CT Angio Chest PE W/Cm &/Or Wo Cm Result Date: 06/07/2023 CLINICAL DATA:  Concern for pulmonary embolism. Short of breath. RIGHT leg pain.  Productive cough EXAM: CT ANGIOGRAPHY CHEST CT ABDOMEN AND PELVIS WITH CONTRAST TECHNIQUE: Multidetector CT imaging of the chest was performed using the standard protocol during bolus administration of intravenous contrast. Multiplanar CT image reconstructions and MIPs were obtained to evaluate the vascular anatomy. Multidetector CT imaging of the abdomen and pelvis was performed using the standard protocol during bolus administration of intravenous contrast. RADIATION DOSE REDUCTION: This exam was performed according to the departmental dose-optimization program which includes automated exposure control, adjustment of the mA and/or kV according to patient size and/or use of iterative reconstruction technique. CONTRAST:  75mL OMNIPAQUE IOHEXOL 350 MG/ML SOLN COMPARISON:  None Available. FINDINGS: CTA CHEST FINDINGS Cardiovascular: There is filling defect within subsegmental branches of the RIGHT lower lobe (image 81 through series 4). These distal segmental filling defects are occlusive. Probable filling defect within the medial branch of the LEFT lobe pulmonary artery (image 89/series 4. The overall clot burden is mild. No evidence of RIGHT ventricular strain. Upper lung lobes are clear. Mediastinum/Nodes: No axillary or supraclavicular adenopathy. No mediastinal or hilar adenopathy. No pericardial fluid. Esophagus normal. Lungs/Pleura: There is  consolidation in the posterior RIGHT lower lobe. There is a moderate size RIGHT effusion. The volume of consolidation appears larger thanexpected from a pulmonary infarction. Favor pneumonia. Mild consolidation atelectasis in the LEFT lower lobe. Small LEFT effusion. Musculoskeletal: No aggressive osseous lesion. Review of the MIP images confirms the above findings. CT ABDOMEN and PELVIS FINDINGS Hepatobiliary: No focal hepatic lesion. Postcholecystectomy. No biliary dilatation. Pancreas: Pancreas is normal. No ductal dilatation. No pancreatic inflammation. Spleen: Normal spleen Adrenals/urinary tract: Adrenal glands normal. The LEFT kidney is atrophic. Small calcification in the RIGHT kidney. Bilateral low-density renal cysts. Ureters and bladder normal. Stomach/Bowel: The stomach, duodenum, and small bowel normal. Multiple diverticula of the descending colon and sigmoid colon without acute inflammation. Vascular/Lymphatic: Abdominal aorta is normal caliber with atherosclerotic calcification. There is no retroperitoneal or periportal lymphadenopathy. No pelvic lymphadenopathy. Reproductive: Uterus and adnexa unremarkable. Other: No free fluid. Musculoskeletal: No aggressive osseous lesion. Review of the MIP images confirms the above findings. IMPRESSION: CHEST: 1. Bilateral lower lobe pulmonary emboli. Mild clot burden. No evidence of RIGHT ventricular strain. 2. Consolidation in the RIGHT lower lobe is favored pneumonia. 3. Moderate size RIGHT effusion. Small LEFT effusion. PELVIS: 1. No acute findings in the abdomen pelvis. 2. Atrophic LEFT kidney. 3.  Aortic Atherosclerosis (ICD10-I70.0). Electronically Signed   By: Genevive Bi M.D.   On: 06/07/2023 17:43   CT ABDOMEN PELVIS W CONTRAST Result Date: 06/07/2023 CLINICAL DATA:  Concern for pulmonary embolism. Short of breath. RIGHT leg pain. Productive cough EXAM: CT ANGIOGRAPHY CHEST CT ABDOMEN AND PELVIS WITH CONTRAST TECHNIQUE: Multidetector CT imaging  of the chest was performed using the standard protocol during bolus administration of intravenous contrast. Multiplanar CT image reconstructions and MIPs were obtained to evaluate the vascular anatomy. Multidetector CT imaging of the abdomen and pelvis was performed using the standard protocol during bolus administration of intravenous contrast. RADIATION DOSE REDUCTION: This exam was performed according to the departmental dose-optimization program which includes automated exposure control, adjustment of the mA and/or kV according to patient size and/or use of iterative reconstruction technique. CONTRAST:  75mL OMNIPAQUE IOHEXOL 350 MG/ML SOLN COMPARISON:  None Available. FINDINGS: CTA CHEST FINDINGS Cardiovascular: There is filling defect within subsegmental branches of the RIGHT lower lobe (image 81 through series 4). These distal segmental filling defects are occlusive. Probable filling defect within the medial branch of the LEFT lobe pulmonary artery (  image 89/series 4. The overall clot burden is mild. No evidence of RIGHT ventricular strain. Upper lung lobes are clear. Mediastinum/Nodes: No axillary or supraclavicular adenopathy. No mediastinal or hilar adenopathy. No pericardial fluid. Esophagus normal. Lungs/Pleura: There is consolidation in the posterior RIGHT lower lobe. There is a moderate size RIGHT effusion. The volume of consolidation appears larger thanexpected from a pulmonary infarction. Favor pneumonia. Mild consolidation atelectasis in the LEFT lower lobe. Small LEFT effusion. Musculoskeletal: No aggressive osseous lesion. Review of the MIP images confirms the above findings. CT ABDOMEN and PELVIS FINDINGS Hepatobiliary: No focal hepatic lesion. Postcholecystectomy. No biliary dilatation. Pancreas: Pancreas is normal. No ductal dilatation. No pancreatic inflammation. Spleen: Normal spleen Adrenals/urinary tract: Adrenal glands normal. The LEFT kidney is atrophic. Small calcification in the RIGHT  kidney. Bilateral low-density renal cysts. Ureters and bladder normal. Stomach/Bowel: The stomach, duodenum, and small bowel normal. Multiple diverticula of the descending colon and sigmoid colon without acute inflammation. Vascular/Lymphatic: Abdominal aorta is normal caliber with atherosclerotic calcification. There is no retroperitoneal or periportal lymphadenopathy. No pelvic lymphadenopathy. Reproductive: Uterus and adnexa unremarkable. Other: No free fluid. Musculoskeletal: No aggressive osseous lesion. Review of the MIP images confirms the above findings. IMPRESSION: CHEST: 1. Bilateral lower lobe pulmonary emboli. Mild clot burden. No evidence of RIGHT ventricular strain. 2. Consolidation in the RIGHT lower lobe is favored pneumonia. 3. Moderate size RIGHT effusion. Small LEFT effusion. PELVIS: 1. No acute findings in the abdomen pelvis. 2. Atrophic LEFT kidney. 3.  Aortic Atherosclerosis (ICD10-I70.0). Electronically Signed   By: Genevive Bi M.D.   On: 06/07/2023 17:43   DG Knee 2 Views Right Result Date: 06/07/2023 CLINICAL DATA:  Larey Seat, right leg pain EXAM: RIGHT KNEE - 1-2 VIEW COMPARISON:  07/07/2019 FINDINGS: Frontal and lateral views of the right knee are obtained. Partial visualization of intramedullary rod within the femur and tibia. No evidence of acute fracture, subluxation, or dislocation. There is mild osteoarthritis of the medial and patellofemoral compartments. No joint effusion. Mild diffuse subcutaneous edema. IMPRESSION: 1. Mild osteoarthritis. 2. No acute fracture. 3. Mild diffuse subcutaneous edema. Electronically Signed   By: Sharlet Salina M.D.   On: 06/07/2023 15:17   DG Hip Unilat W or Wo Pelvis 2-3 Views Right Result Date: 06/07/2023 CLINICAL DATA:  Larey Seat, right leg pain EXAM: DG HIP (WITH OR WITHOUT PELVIS) 2-3V RIGHT COMPARISON:  08/20/2009 FINDINGS: Frontal view of the pelvis as well as frontal and cross-table lateral views of the right hip are obtained. There is a  comminuted intertrochanteric right hip fracture, with impaction and varus angulation at the fracture site. No dislocation. Mild symmetrical bilateral hip osteoarthritis. The remainder of the bony pelvis is unremarkable. IMPRESSION: 1. Comminuted intertrochanteric right hip fracture, with impaction and varus angulation. Electronically Signed   By: Sharlet Salina M.D.   On: 06/07/2023 15:14   DG Chest 1 View Result Date: 06/07/2023 CLINICAL DATA:  Short of breath, right leg pain, fell 3 days ago, productive cough EXAM: CHEST  1 VIEW COMPARISON:  11/26/2018 FINDINGS: Single frontal view of the chest demonstrates marked enlargement of the cardiac silhouette. Aortic valve prosthesis is noted. There is increased pulmonary vascular congestion. Veiling opacities at the lung bases, right greater than left, consistent with consolidation and/or effusions. No pneumothorax. No acute bony abnormalities. IMPRESSION: 1. Marked enlargement of the cardiac silhouette. 2. Pulmonary vascular congestion, with bibasilar veiling opacities consistent with consolidation and/or effusions. Findings could reflect sequela of congestive heart failure. Electronically Signed   By: Maxwell Caul.D.  On: 06/07/2023 15:13   VAS US RENAL ARTERY DUPLEX Result Date: 06/06/2023 ABDOMINAL VISCERAL Patient Name:  CHUNDRA SAUERWEIN  Date of Exam:   06/04/2023 Medical Rec #: [id number]       Accession #:    [id number] Date of Birth: 1951-01-19        Patient Gender: F Patient Age:   32 years Exam Location:  [facility] Procedure:      VAS US RENAL ARTERY DUPLEX Referring Phys: [id number] MUHAMMAD A ARIDA -------------------------------------------------------------------------------- Indications: History of renal artery stenosis. Atrophic left kidney. History of              right renal artery intervention. High Risk Factors: Hypertension, hyperlipidemia, current smoker, coronary artery                    disease. Other Factors: S/p TAVR, COPD. Vascular  Interventions: Right renal artery stent placed on 06/06/2021. Limitations: Air/bowel gas and respiration variation. Comparison Study: In 04/2022, a renal artery duplex showed velocities consistent                   with 70-99% stenosis in the SMA. No evidence of stenosis in                   the right renal artery, s/p stent placement. Atrophic left                   kidney, with main renal artery not visualized, most likely                   occluded. Performing Technologist: Tyna Jaksch RVT  Examination Guidelines: A complete evaluation includes B-mode imaging, spectral Doppler, color Doppler, and power Doppler as needed of all accessible portions of each vessel. Bilateral testing is considered an integral part of a complete examination. Limited examinations for reoccurring indications may be performed as noted.  Duplex Findings: +--------------------+--------+--------+------+----------------------+ Mesenteric          PSV cm/sEDV cm/sPlaque       Comments        +--------------------+--------+--------+------+----------------------+ Aorta Prox             42                 2.4 cm AP x 2.4 cm TRV +--------------------+--------+--------+------+----------------------+ Aorta Mid              77                 2.5 cm AP x 2.5 cm TRV +--------------------+--------+--------+------+----------------------+ Aorta Distal           66                 2.8 cm AP x 2.9 cm TRV +--------------------+--------+--------+------+----------------------+ Celiac Artery Origin   72      12                                +--------------------+--------+--------+------+----------------------+ SMA Origin            300      46                                +--------------------+--------+--------+------+----------------------+   +------------------+--------+--------+-------+ Right Renal ArteryPSV cm/sEDV cm/sComment +------------------+--------+--------+-------+ Origin               85  25            +------------------+--------+--------+-------+ Proximal             98      29           +------------------+--------+--------+-------+ Mid                  82      18           +------------------+--------+--------+-------+ Distal               37      11           +------------------+--------+--------+-------+  Technologist observations: Possibly avascular exophytic cystic mass at the upper pole of the right kidney, measuring 2.4 x 2.5 x 2.4 cm. Possibly avascular exophytic septated cystic mass at the lower pole of the left kidney, measuring3.1 x 2.0 x 3.1 cm. +------------+--------+--------+----+-----------+--------+--------+---+ Right KidneyPSV cm/sEDV cm/sRI  Left KidneyPSV cm/sEDV cm/sRI  +------------+--------+--------+----+-----------+--------+--------+---+ Upper Pole  14      6       0.58Upper Pole                     +------------+--------+--------+----+-----------+--------+--------+---+ Mid         12      6       0.                            +------------+--------+--------+----+-----------+--------+--------+---+ Lower Pole  9       4       0.55Lower Pole                     +------------+--------+--------+----+-----------+--------+--------+---+ Hilar       36      11      0.69Hilar                          +------------+--------+--------+----+-----------+--------+--------+---+ +------------------+------+------------------+------+ Right Kidney            Left Kidney              +------------------+------+------------------+------+ RAR                     RAR                      +------------------+------+------------------+------+ RAR (manual)      2.33  RAR (manual)             +------------------+------+------------------+------+ Cortex            .88 cmCortex            .35 cm +------------------+------+------------------+------+ Cortex thickness        Corex thickness           +------------------+------+------------------+------+ Kidney length (cm)10.82 Kidney length (cm)6.86   +------------------+------+------------------+------+  Summary: Largest Aortic Diameter: Abnormal tapering of the abdominal aorta. The distal abdominal aorta is ectatic, measuring 2.9 cm  Renal:  Right: Normal size right kidney. Normal right Resistive Index.        Normal cortical thickness of right kidney. No evidence of        right renal artery stenosis. RRV flow present. Cyst(s) noted. Left:  Abnormal size for the left kidney. Normal cortical thickness        of the left kidney. Cyst(s) noted. Possibly avascular        exophytic septated  cystic mass at the lower pole of the left        kidney, measuring 3.1 x 2.0 x 3.1 cm. Atrophic kidney with        decreased parenchymal flow. Renal artery appears occluded. Mesenteric: Normal Celiac artery findings. 70 to 99% stenosis in the superior mesenteric artery.  Patent IVC.  *See table(s) above for measurements and observations.  Diagnosing physician: Dina Rich MD  Electronically signed by Dina Rich MD on 06/06/2023 at 1:53:29 PM.    Final    VAS US CAROTID Result Date: 06/06/2023 Carotid Arterial Duplex Study Patient Name:  TIERRAH ANASTOS  Date of Exam:   06/04/2023 Medical Rec #: [id number]       Accession #:    [id number] Date of Birth: Jan 02, 1951        Patient Gender: F Patient Age:   54 years Exam Location:  [facility] Procedure:      VAS US CAROTID Referring Phys: Jerolyn Center ARIDA --------------------------------------------------------------------------------  Indications:       Carotid artery disease and patient denies any cerebrovascular                    symptoms. Risk Factors:      Hypertension, hyperlipidemia, current smoker, coronary artery                    disease, PAD. Other Factors:     S/p TAVR, COPD. Comparison Study:  In 04/2022, a carotid duplex showed velocities of 58/14 cm/s                    in the RICA and 225/47 cm/s in the  LICA. Performing Technologist: Tyna Jaksch RVT  Examination Guidelines: A complete evaluation includes B-mode imaging, spectral Doppler, color Doppler, and power Doppler as needed of all accessible portions of each vessel. Bilateral testing is considered an integral part of a complete examination. Limited examinations for reoccurring indications may be performed as noted.  Right Carotid Findings: +----------+-------+--------+--------+-----------------+-----------------------+           PSV    EDV cm/sStenosisPlaque           Comments                          cm/s                   Description                              +----------+-------+--------+--------+-----------------+-----------------------+ CCA Prox  42     9               heterogenous                             +----------+-------+--------+--------+-----------------+-----------------------+ CCA Distal41     10                                                       +----------+-------+--------+--------+-----------------+-----------------------+ ICA Prox  36     13              hyperechoic                              +----------+-------+--------+--------+-----------------+-----------------------+  ICA Mid   53     14      1-39%   hyperechoic                              +----------+-------+--------+--------+-----------------+-----------------------+ ICA Distal83     21                               serpentine and                                                            turbulent               +----------+-------+--------+--------+-----------------+-----------------------+ ECA       48     7                                                        +----------+-------+--------+--------+-----------------+-----------------------+ +----------+--------+-------+---------+-------------------+           PSV cm/sEDV cmsDescribe Arm Pressure (mmHG)  +----------+--------+-------+---------+-------------------+ Subclavian140            Turbulent154                 +----------+--------+-------+---------+-------------------+ +---------+--------+--+--------+--+---------+ VertebralPSV cm/s77EDV cm/s16Antegrade +---------+--------+--+--------+--+---------+  Left Carotid Findings: +----------+--------+--------+--------+------------------+---------------------+           PSV cm/sEDV cm/sStenosisPlaque DescriptionComments              +----------+--------+--------+--------+------------------+---------------------+ CCA Prox  37      5                                 tortuous              +----------+--------+--------+--------+------------------+---------------------+ CCA Distal37      8                                                       +----------+--------+--------+--------+------------------+---------------------+ ICA Prox  227     36      40-59%  calcific          shadowing, stenosis                                                       based on peak                                                             systolic velocities   +----------+--------+--------+--------+------------------+---------------------+ ICA Mid   182     31  calcific          Shadowing             +----------+--------+--------+--------+------------------+---------------------+ ICA Distal41      10                                serpentine            +----------+--------+--------+--------+------------------+---------------------+ ECA       338     32      >50%    calcific                                +----------+--------+--------+--------+------------------+---------------------+ +----------+--------+--------+-----------------------------+-------------------+           PSV cm/sEDV cm/sDescribe                     Arm Pressure (mmHG)  +----------+--------+--------+-----------------------------+-------------------+ Subclavian191             turbulent, unable to         120                                           duplicate elevated velocities                                              of 309 cm/s from prior exam                      +----------+--------+--------+-----------------------------+-------------------+ +---------+--------+--+--------+-+----------+ VertebralPSV cm/s81EDV cm/s0Retrograde +---------+--------+--+--------+-+----------+   Summary: Right Carotid: Velocities in the right ICA are consistent with a 1-39% stenosis. Left Carotid: Velocities in the left ICA are consistent with a 40-59% stenosis.               The ECA appears >50% stenosed. LICA stenosis based on peak               systolic velocities. Vertebrals:  Right vertebral artery demonstrates antegrade flow. Left vertebral              artery demonstrates retrograde flow. Subclavians: Bilateral subclavian artery flow was disturbed. *See table(s) above for measurements and observations. Suggest follow up study in 12 months.  Retrograde flow in the left vertebral artery. Brisk triphasic flow in the right brachial artery; dampened monophasic flow on the left. There is a 34 mmHg pressure difference between the arms, the left being the lowest. Consider left subclavian steal. Electronically signed by Dina Rich MD on 06/06/2023 at 1:39:47 PM.    Final      Assessment and Plan:   Pulmonary embolism Congestive heart failure-acute/chronic with previously normal LV function Elevated HS-troponin: Fracture right hip Pneumonia leukocytosis Renal insufficiency Estimated Creatinine Clearance: 26.1 mL/min (A) (by C-G formula based on SCr of 1.7 mg/dL (H)).  Grade 4-minimal interval improvement Anemia  may be more impressive than initially appreciated 2/2 volume depletion Atrial tachycardia  The patient has an elevated troponin in the setting of  known nonischemic cardiomyopathy with pulmonary embolism and pneumonia.  Suspect it is secondary.  Markedly elevated BNP.  Consistent with heart failure. Also with a hip fracture that apparently may need surgical repair.  From a cardiac point of view we will undertake diuresis; creatinine is significantly elevated and this is anomalous for her as her baseline is probably in the low to mid ones (0.96 7/23 and 1.21 3/23)  She will also need input from the PE team as she may need filtering so as to allow surgery to proceed if it ought not to be delayed  Will continue on her bisoprolol for her atrial arrhythmias.  Will add intravenous amiodarone for the short-term   For questions or updates, please contact [facility] Please consult [email or web address] for contact info under    Signed, Judie Grieve, MD  06/08/2023 4:20 AM

## 2023-06-08 NOTE — Progress Notes (Signed)
 Patient nausea and vomiting.  Pressure dropped and became symptomatic.  Rapid called, MD called.  Pressed to Code and patient moved to 2H.

## 2023-06-08 NOTE — Progress Notes (Signed)
 Chaplain responds to code blue and finds pt's family -- husband, son, daughter-in-law, granddaughter, and grandson in the hallway. They were present when she coded. Chaplain provides pastoral support and escorts family to conference room, where physician provides updates until pt is transferred to cardiac ICU. Medical staff accompanied family members to new unit. Chaplain will notify oncoming chaplain of pt's precarious condition.

## 2023-06-08 NOTE — Progress Notes (Signed)
 Upon iv team arrival to code, pt had 2 PIV

## 2023-06-08 NOTE — Code Documentation (Signed)
  Patient Name: Alexandra [name]   MRN: [id number]   Date of Birth/ Sex: [birth year] , female      Admission Date: 06/07/2023  Attending Provider: Jonah Blue, MD  Primary Diagnosis: Hip fracture [facility])   Indication: Pt was in her usual state of health until this PM, when she was noted to be hypotensive and bradycardic. Amiodarone was held but patient remained hypotensive and code blue was subsequently called. At the time of arrival on scene, ACLS protocol was underway and patient was noted to be in PEA arrest.    Technical Description:  - CPR performance duration:  25 minutes  - Was defibrillation or cardioversion used? Yes   - Was external pacer placed? No  - Was patient intubated pre/post CPR? Yes   Medications Administered: Y = Yes; Blank = No Amiodarone    Atropine    Calcium  Y  Epinephrine  Y  Lidocaine    Magnesium    Norepinephrine    Phenylephrine    Sodium bicarbonate  Y  Vasopressin     Post CPR evaluation:  - Final Status - Was patient successfully resuscitated ? Yes - What is current rhythm? Sinus tach - What is current hemodynamic status? stable  Miscellaneous Information:  - Labs sent, including: CBC, BMP, trop, lactic  - Primary team notified?  Yes and came to bedside  - Family Notified? Yes  - Additional notes/ transfer status: Patient stabilized and transferred to 2H14- hand off to ICU team.      Chauncey Mann, DO  06/08/2023, 8:31 PM

## 2023-06-08 NOTE — Procedures (Signed)
 Intubation Procedure Note  Alexandra [name]  [id number]  02-Oct-1950  Date:06/08/23  Time:8:15 PM   Provider Performing:Carlee Vonderhaar, Hetty Ely    Procedure: Intubation (31500)  Indication(s) Respiratory Failure  Consent Unable to obtain consent due to inability to find a medical decision maker for patient.  All reasonable efforts were made.  Another independent medical provider, Marcellina Millin RN , confirmed the benefits of this procedure outweigh the risks.   Anesthesia None needed    Time Out Verified patient identification, verified procedure, site/side was marked, verified correct patient position, special equipment/implants available, medications/allergies/relevant history reviewed, required imaging and test results available.   Sterile Technique Usual hand hygeine, masks, and gloves were used   Procedure Description Patient positioned in bed supine.  Sedation given as noted above.  Patient was intubated with endotracheal tube using  Direct Visualization.  .  View was Grade 1 full glottis .  Number of attempts was 1.  Colorimetric CO2 detector was consistent with tracheal placement.   Complications/Tolerance None; patient tolerated the procedure well. Chest X-ray is ordered to verify placement.   EBL NONE   Specimen(s) None

## 2023-06-08 NOTE — Significant Event (Signed)
 Rapid Response Event Note   Reason for Call :  Hypotensive & dry heaving  Initial Focused Assessment:  Pt lying in bed. Lethargic. Opens eyes, tells me her name. Diaphoretic. BP 84/58, continued to decline.   Pt breathing became agonal. PEA. Code blue initiated.   Interventions:  -Narcan 0.2mg  IV -500cc IVF bolus started  Plan of Care:  -Code blue called- PEA  Event Summary:  MD Notified: Dr. Loney Loh Call Time: 1916 Arrival Time: 1920 End Time: 2040  Jennye Moccasin, RN

## 2023-06-08 NOTE — Progress Notes (Signed)
 eLink Physician-Brief Progress Note Patient Name: Alexandra [name] DOB: [birth year] MRN: [id number]   Date of Service  06/08/2023  HPI/Events of Note  73 year old female was admitted with a history of nonobstructive coronary artery disease and aortic valve stenosis status post TAVR who presented with acute pulmonary embolism in the setting of a hip fracture developed a PEA arrest on 3/16 and was transferred to the ICU subsequently.  Total time of arrest is 25 minutes.  On examination, she is tachypneic, now normotensive, and unable to pick up a saturation despite 70% FiO2.  On a propofol infusion, amiodarone held, norepinephrine initiated.  Fentanyl infusion.  Results show anion gap metabolic acidosis, elevated creatinine, downtrending troponin, leukocytosis and anemia.  Prearrest echocardiogram with evidence of LVH, moderately reduced EF 35-45% and elevated RV pressures to 65 mmHg.  eICU Interventions  Maintain mechanical ventilation, daily spontaneous awakening trials and breathing trials  Maintain systemic anticoagulation  Attempt to maintain euvolemia for net negative balance  DVT prophylaxis/treatment with heparin GI prophylaxis with pantoprazole, discontinue concurrent famotidine   0123 - minimal uop, Norepi at 18, add vasopressin  0517 -minimal urine output, lactate climbing to 4.2.  Rhythm is sinus with ST changes on monitor.  EKG with stable left bundle branch.  Add stress dose steroids      Laiyla Slagel 06/08/2023, 10:00 PM

## 2023-06-08 NOTE — Consult Note (Addendum)
 NAME:  Alexandra [name], MRN:  [id number], DOB:  1950/08/05, LOS: 1 ADMISSION DATE:  06/07/2023, CONSULTATION DATE:  06/08/2023 REFERRING MD:  Hospitalist, CHIEF COMPLAINT:  Cardiac Arrest   History of Present Illness:  73 y/o female with PMH for non-obstructive coronary artery disease, renal artery stenosis status post stent to right renal artery, lower extremity peripheral arterial disease status post stent placement to right common iliac artery, aortic valve stenosis status post TAVR, probable diastolic heart failure, COPD, hypercholesteremia, hypertension and likely Pulmonary HTN who presented with cough and had a coughing fit that caused her to fall and she suffered right hip fracture.  She was 88% RA when EMS arrived.  She underwent a CTA which revealed segmental/subsegmental Pulmonary Emboli with mild clot burden.  She was started on IV Heparin and admitted, ortho surgery pending.  Her troponins were high and Cardiology was following.  She had Amiodarone drip started but due to bradycardia that was stopped. The patient suddenly became nauseous and possibly vomited (conflicting reports of vomiting/not vomiting); then she had PEA arrest and CPR BLS/ACLS was conducted for 25 min and she received multiple rounds of Epinephrine.  Pertinent  Medical History  non-obstructive coronary artery disease, renal artery stenosis status post stent to right renal artery, lower extremity peripheral arterial disease status post stent placement to right common iliac artery,  aortic valve stenosis status post TAVR,  probable diastolic heart failure,  COPD,  hypercholesteremia,  hypertension   Significant Hospital Events: Including procedures, antibiotic start and stop dates in addition to other pertinent events   3/15: Admitted with PE and hip fracture 3/16: PEA arrest transferred to 2H  Interim History / Subjective:  N/a  Objective   Blood pressure (!) 39/12, pulse 60, temperature 98.8 F (37.1 C), resp.  rate 19, height 5\' 5"  (1.651 m), weight 62.7 kg, SpO2 100%.    Vent Mode: PRVC FiO2 (%):  [70 %-100 %] 70 % Set Rate:  [20 bmp] 20 bmp Vt Set:  [440 mL] 440 mL PEEP:  [5 cmH20] 5 cmH20   Intake/Output Summary (Last 24 hours) at 06/08/2023 2046 Last data filed at 06/08/2023 1756 Gross per 24 hour  Intake 1204.86 ml  Output 3125 ml  Net -1920.14 ml   Filed Weights   06/07/23 1915 06/08/23 0356 06/08/23 0501  Weight: 62.7 kg 62.7 kg 62.7 kg    Examination: General: on vent NAD HENT: pupils round and large and reactive Lungs: CTA no wheezes no rales Cardiovascular: reg s1s2 no gallops or rubs Abdomen: soft nt nd bs pos no guarding Extremities: pos cyanosis, no clubbing or edema Neuro: sedated and intubated   Resolved Hospital Problem list   N/a  Assessment & Plan:  PEA Cardiac Arrest  -25 min CPR before ROSC suggesting prolonged PEA Arrest  -EKG LBBB, no acute ST elevation, LBBB similar to old EKGs  -intubated post arrest  Pulmonary Emboli  -Restart Heparin  -Venous dopplers negative for DVT b/l  S/p Hip fracture secondary to mechanical fall Comminuted intertrochanteric right hip fracture, with impaction and varus angulation on imaging Orthopedics following Hip fracture repair will be delayed in the setting of acute PE  She will be placed in Buck's traction for now Pain control with Tylenol, Robaxin, Oxycodone, and Dilaudid prn  Metabolic Acidosis  -post arrest -received Bicarb pushes x 2 in 2Heart Following ABG/VBG  Pneumonia (CAP)  -Continue with antibiotics  -no witnessed aspiration  Right pleural effusion  -diuresis when possible  -may require thoracentesis  CHF -diuresis when possible -Cardiology following -trend troponins  Atrial Fibrillation  -Currently on Heparin drip  -rate is controlled and she is hypotensive, no role for B-Blockers until her BP improves Cardiology called and case discussed with them, no Cardiac intervention at this  time.  Best Practice (right click and "Reselect all SmartList Selections" daily)   Diet/type: NPO DVT prophylaxis systemic heparin Pressure ulcer(s): N/A GI prophylaxis: H2B Lines: Central line Foley:  N/A Code Status:  full code   Labs   CBC: Recent Labs  Lab 06/07/23 1415 06/07/23 1428 06/08/23 0232 06/08/23 2036  WBC 15.0*  --  19.9*  --   NEUTROABS  --   --  16.9*  --   HGB 9.9* 11.9* 9.9* 10.2*  HCT 32.9* 35.0* 32.7* 30.0*  MCV 81.0  --  80.1  --   PLT 205  --  180  --     Basic Metabolic Panel: Recent Labs  Lab 06/07/23 1415 06/07/23 1428 06/08/23 0232 06/08/23 2036  NA 139 137 140 140  K 3.9 3.9 3.4* 4.5  CL 107 109 105  --   CO2 17*  --  20*  --   GLUCOSE 95 102* 69*  --   BUN 41* 39* 44*  --   CREATININE 1.78* 1.90* 1.70*  --   CALCIUM 7.9*  --  7.4*  --   MG 2.3  --   --   --    GFR: Estimated Creatinine Clearance: 26.1 mL/min (A) (by C-G formula based on SCr of 1.7 mg/dL (H)). Recent Labs  Lab 06/07/23 1415 06/07/23 1458 06/07/23 1757 06/08/23 0232  WBC 15.0*  --   --  19.9*  LATICACIDVEN  --  2.9* 2.0*  --     Liver Function Tests: Recent Labs  Lab 06/07/23 1415  AST 179*  ALT 117*  ALKPHOS 81  BILITOT 2.9*  PROT 5.8*  ALBUMIN 2.9*   Recent Labs  Lab 06/07/23 1415  LIPASE 21   No results for input(s): "AMMONIA" in the last 168 hours.  ABG    Component Value Date/Time   PHART 7.221 (L) 06/08/2023 2036   PCO2ART 39.1 06/08/2023 2036   PO2ART 204 (H) 06/08/2023 2036   HCO3 16.0 (L) 06/08/2023 2036   TCO2 17 (L) 06/08/2023 2036   ACIDBASEDEF 11.0 (H) 06/08/2023 2036   O2SAT 100 06/08/2023 2036     Coagulation Profile: No results for input(s): "INR", "PROTIME" in the last 168 hours.  Cardiac Enzymes: No results for input(s): "CKTOTAL", "CKMB", "CKMBINDEX", "TROPONINI" in the last 168 hours.  HbA1C: Hgb A1c MFr Bld  Date/Time Value Ref Range Status  12/29/2017 02:21 PM 5.5 4.8 - 5.6 % Final    Comment:     (NOTE) Pre diabetes:          5.7%-6.4% Diabetes:              >6.4% Glycemic control for   <7.0% adults with diabetes     CBG: Recent Labs  Lab 06/08/23 0615 06/08/23 1946  GLUCAP 85 119*    Review of Systems:   Unable to conduct, patient post cardiac arrest  Past Medical History:  She,  has a past medical history of Arthritis, COPD (chronic obstructive pulmonary disease) (HCC) (05/22/2016), Coronary artery disease, non-occlusive (11/28/2016), GERD (gastroesophageal reflux disease), Heart murmur, adenomatous colonic polyps (10/19/2015), Hyperlipidemia, Hypertension, Moderate mitral regurgitation by prior echocardiography (10/2016), Multiple renal cysts (06/2020), PAOD (peripheral arterial occlusive disease) (HCC) (06/2020), Pulmonary hypertension associated Aortic Stenosis,  Mitral Regurgitation & COPD, Pulmonary nodules, Renal artery stenosis, native, bilateral (HCC) (06/2020), Renal artery stenosis, native, bilateral (HCC) (06/2020), S/P TAVR (transcatheter aortic valve replacement) (12/30/2017), Severe aortic stenosis (2018), and Superior mesenteric artery stenosis (HCC) (06/2020).   Surgical History:   Past Surgical History:  Procedure Laterality Date   ABDOMINAL AORTOGRAM W/LOWER EXTREMITY N/A 07/12/2020   Procedure: ABDOMINAL AORTOGRAM W/LOWER EXTREMITY;  Surgeon: Iran Ouch, MD;  Location: [facility] INVASIVE CV LAB;; Severe B-RA stenosis - sm/atretic L RA. Severe Dz R Com-Ext Iliac A (PTA-Stent). B-SFA flush CTO. Mod R CFA, L Iliac-CFA. B-SFA recon @ dPop A. R PTA flush CTO & 2 V runoff. L LE 3 V runoff w/ distal LPTA CTO.   Sm InfraRenal aneurysms w/ extensive atheroma - no obstructive Dz   APPENDECTOMY     BREAST CYST EXCISION Right 1970   CARDIAC VALVE REPLACEMENT     CATARACT EXTRACTION W/ INTRAOCULAR LENS IMPLANT Right    CHOLECYSTECTOMY OPEN     COLONOSCOPY  2003, 2017   CTA Chest  09/2016   Thoracic Aortic Ca2+ w/o dilation.  Coronary Calcification noted. PA normal.  Mild Emphysematous changes w/ mild LLL scarring.     FRACTURE SURGERY     2007 -right tib /fib fracture ----07/2009 right femur fx after a fall   FRACTURE SURGERY Right 2011   Femur   PERIPHERAL VASCULAR CATHETERIZATION N/A 08/09/2014   Procedure: Aortic Arch Angiography;  Surgeon: Nada Libman, MD;  Location: [facility] INVASIVE CV LAB;  Service: Cardiovascular: Type 1 Arch. No significant stenosis, aneurysmal degeneration or dissection.  No luminal irregularity seen in R or L SubClavian, Brachial or Radial A. Chronic distal ulnar A occlusion Bilatera.   PERIPHERAL VASCULAR CATHETERIZATION Right 08/09/2014   Procedure: Upper Extremity Angiography;  Surgeon: Nada Libman, MD;  Location: [facility] INVASIVE CV LAB;  Service: Cardiovascular;  Laterality: Right;   PERIPHERAL VASCULAR INTERVENTION  07/12/2020   Procedure: PERIPHERAL VASCULAR INTERVENTION;  Surgeon: Iran Ouch, MD;  Location: [facility] INVASIVE CV LAB;  Service: Cardiovascular;;  R Common-External Iliac A PTA & Stent   RENAL ANGIOGRAPHY N/A 06/06/2021   Procedure: RENAL ANGIOGRAPHY;  Surgeon: Iran Ouch, MD;  Location: [facility] INVASIVE CV LAB;  Service: Cardiovascular; severe 90% ostial R Renal A stenosis (also noted was severe L Rena A stenosis, but known L kidney atrophy) => Stent R Renal A   RENAL INTERVENTION  06/06/2021   Procedure: RENAL INTERVENTION;  Surgeon: Iran Ouch, MD;  Location: [facility] INVASIVE CV LAB;  Service: Cardiovascular;;  Successful stent (Herculink 6.5 mm x 15 mm) placement to the right renal artery via the left radial artery approach.   RIGHT/LEFT HEART CATH AND CORONARY ANGIOGRAPHY N/A 12/06/2016   Procedure: RIGHT/LEFT HEART CATH AND CORONARY ANGIOGRAPHY;  Surgeon: Marykay Lex, MD;  Location: [facility] INVASIVE CV LAB: Cor Angio: 65% dLAD (Med Rx). AVA 0.96 cm; P-P gradient ~ 30 mmHg, Mean ~24.5-27.5 (Mod AS); RHC #s: RAP 8 mmHg, RVP/EDP: 71/8/15 mmHg, PCWP: 21-24 mmHg, PAP/mean: 71/32/52 mmHg = Severe Mixed Pulmonary  HTN; LVP/EDP 205/17/26 mmHg ; CO/CI by Fick: 4.35, 2.44 - mildly reduced   RIGHT/LEFT HEART CATH AND CORONARY ANGIOGRAPHY N/A 11/20/2017   Procedure: RIGHT/LEFT HEART CATH AND CORONARY ANGIOGRAPHY;  Surgeon: Laurey Morale, MD;  Location: [facility] INVASIVE CV LAB;  Service: Cardiovascular;  Laterality: N/A;   TEE WITHOUT CARDIOVERSION N/A 12/30/2017   Procedure: (Intra-Op) TRANSESOPHAGEAL ECHOCARDIOGRAM (TEE);  Surgeon: Kathleene Hazel, MD;  Location: [facility] OR;  Service:  Open Heart Surgery; Post TAVR good position of 23 mm AP and 3 valve.  No new or WMA.  EF 55-60%.  No effusion.  Mild perivalvular regurgitation seen.  Mean gradient 7 mmHg.  Peak 16 mmHg.   TOTAL KNEE ARTHROPLASTY Left 12/01/2018   Procedure: Left Knee Arthroplasty;  Surgeon: Marcene Corning, MD;  Location: [facility] ORS;  Service: Orthopedics;  Laterality: Left;   TRANSCATHETER AORTIC VALVE REPLACEMENT, TRANSFEMORAL N/A 12/30/2017   Procedure: TRANSCATHETER AORTIC VALVE REPLACEMENT, TRANSFEMORAL;  Surgeon: Kathleene Hazel, MD;  Location: [facility] OR;  Service: Open Heart Surgery;  Laterality: N/A; 23 mm Edwards SAPIEN 3 THV   TRANSTHORACIC ECHOCARDIOGRAM  10/30/2016   Mild Concentric LVH. EF 55-60%. No RWMA, Gr 2 DD. Mod-Severe AS (mean-peak Gradient 31 mmHG - 64 mmHg), Mod MR. Mod LA dilation, Mod PA HTN - peak pressure ~54 mmHg). = Progression of AS from 2014   TRANSTHORACIC ECHOCARDIOGRAM  01/2019   EF 55 to 60%.  Elevated LVEDP.  GR 1 DD.  No R WMA.  Moderate LA dilation.  Well-seated stented bioprosthetic aortic valve.  Mean gradient 13 mercury.  Moderate MAC moderate MR.  No evidence of mildly elevated RAP/CVP and moderately elevated PA P   TRANSTHORACIC ECHOCARDIOGRAM  10/2017   a) Pre-TAVR 10/31/17: EF 55 to 60%.  GR 1 DD.  Calcific severe AS.  Peak Grad 62 mmHg, mean 32 mmHg.  Progression of Dz.  AVA ~0.8 cm.; b) Post TAVR 10/9/'19: EF 60-65%.  GR 1 DD.  Stable appearing 23 mm Edwards's Sapien AV bioprosthesis.  No perivalvular  AI.  Mean Grad 22 mmHg; c) 11/13/'19: AoV mean Grad 19 mmHg,, Peak 36 mmHg. PAP ~33 mmHg   TRANSTHORACIC ECHOCARDIOGRAM  08/02/2020   EF 60 to 65%.  GR 1 DD.  Elevated LAP/LVEDP.  Mildly elevated PAP.  Mild LA dilation.  23 mm SAPIEN 3 prosthetic TAVR valve present.  Mild perivalvular leak.  Mean gradient 16 mmHg. Mod MR.   ULTRASOUND GUIDANCE FOR VASCULAR ACCESS  08/09/2014   Procedure: Ultrasound Guidance For Vascular Access;  Surgeon: Nada Libman, MD;  Location: [facility] INVASIVE CV LAB;  Service: Cardiovascular;;     Social History:   reports that she has been smoking cigarettes. She has a 4 pack-year smoking history. She has never used smokeless tobacco. She reports that she does not currently use alcohol. She reports that she does not use drugs.   Family History:  Her family history includes Heart attack in her brother; Heart disease in her father and mother; Hypertension in her mother. There is no history of Colon cancer, Colon polyps, Esophageal cancer, Rectal cancer, or Stomach cancer.   Allergies Allergies  Allergen Reactions   Codeine Nausea Only   Symproic [Naldemedine] Nausea And Vomiting     Home Medications  Prior to Admission medications   Medication Sig Start Date End Date Taking? Authorizing Provider  fluticasone-salmeterol (ADVAIR) 100-50 MCG/ACT AEPB Inhale 1 puff into the lungs 2 (two) times daily. 05/26/23  Yes [provider]  albuterol (VENTOLIN HFA) 108 (90 Base) MCG/ACT inhaler Inhale 2 puffs into the lungs every 4 (four) hours as needed. 06/02/23   [provider]  alendronate (FOSAMAX) 70 MG tablet Take 70 mg by mouth once a week.    [provider]  aspirin 81 MG tablet Take 1 tablet (81 mg total) by mouth 2 (two) times daily at 10 AM and 5 PM. Patient taking differently: Take 81 mg by mouth 3 (three) times a  week. 12/02/18   Elodia Florence, PA-C  bisoprolol-hydrochlorothiazide [facility]) 10-6.25 MG tablet TAKE 1 TABLET BY MOUTH DAILY 05/23/23    Iran Ouch, MD  clopidogrel (PLAVIX) 75 MG tablet TAKE 1 TABLET(75 MG) BY MOUTH DAILY Patient taking differently: Take 75 mg by mouth daily. 04/23/23   Iran Ouch, MD  cyclobenzaprine (FLEXERIL) 10 MG tablet Take 10 mg by mouth at bedtime. 04/27/21   [provider]  esomeprazole (NEXIUM) 40 MG capsule TAKE 1 CAPSULE BY MOUTH DAILY AT NOON Patient taking differently: Take 40 mg by mouth daily at 12 noon. 07/22/22   Marykay Lex, MD  furosemide (LASIX) 20 MG tablet TAKE 1 TABLET BY MOUTH DAILY AS NEEDED. INCREASE SWELLING Patient taking differently: Take 20 mg by mouth as needed for fluid. 01/28/22   Marykay Lex, MD  losartan (COZAAR) 100 MG tablet TAKE 1 TABLET(100 MG) BY MOUTH DAILY Patient taking differently: Take 100 mg by mouth daily. 10/15/22   Iran Ouch, MD  oxyCODONE-acetaminophen (PERCOCET) 10-325 MG tablet Take 1 tablet by mouth 5 (five) times daily. 10/16/19   [provider]  potassium chloride SA (KLOR-CON M) 20 MEQ tablet Take 1 tablet (20 mEq total) by mouth daily. KEEP OV. 04/30/23   Marykay Lex, MD  rosuvastatin (CRESTOR) 20 MG tablet TAKE 1 TABLET(20 MG) BY MOUTH DAILY Patient taking differently: Take 20 mg by mouth daily. 09/16/22   Iran Ouch, MD  tiZANidine (ZANAFLEX) 4 MG tablet Take 4 mg by mouth at bedtime as needed for muscle spasms. 08/13/21   [provider]     Critical care time: 20   The patient is critically ill with multiple organ system failure and requires high complexity decision making for assessment and support, frequent evaluation and titration of therapies, advanced monitoring, review of radiographic studies and interpretation of complex data.   Critical Care Time devoted to patient care services, exclusive of separately billable procedures, described in this note is 45 minutes.   Rozann Lesches, MD  Pulmonary & Critical care See Amion for pager  If no response to pager , please call [phone] until 7pm After 7:00 pm call Elink  [phone] 06/08/2023, 8:46 PM

## 2023-06-08 NOTE — Procedures (Signed)
 Central Venous Catheter Insertion Procedure Note  BRIDGETTE WOLDEN  [id number]  03-Jan-1951  Date:06/08/23  Time:9:17 PM   Provider Performing:Anshu Wehner Bea Laura  Cherlynn Polo   Procedure: Insertion of Non-tunneled Central Venous Catheter(36556) with US guidance (56213)   Indication(s) Medication administration  Consent Unable to obtain consent due to emergent nature of procedure.  Anesthesia Topical only with 1% lidocaine   Timeout Verified patient identification, verified procedure, site/side was marked, verified correct patient position, special equipment/implants available, medications/allergies/relevant history reviewed, required imaging and test results available.  Sterile Technique Maximal sterile technique including full sterile barrier drape, hand hygiene, sterile gown, sterile gloves, mask, hair covering, sterile ultrasound probe cover (if used).  Procedure Description Area of catheter insertion was cleaned with chlorhexidine and draped in sterile fashion.  With real-time ultrasound guidance a central venous catheter was placed into the right internal jugular vein. Nonpulsatile blood flow and easy flushing noted in all ports.  The catheter was sutured in place and sterile dressing applied.  Complications/Tolerance None; patient tolerated the procedure well. Chest X-ray is ordered to verify placement for internal jugular or subclavian cannulation.   Chest x-ray is not ordered for femoral cannulation.  EBL Minimal  Specimen(s) None  Cristopher Peru, PA-C [facility] 06/08/23 9:17 PM  Please see [email or web address] for pager details.  From 7A-7P if no response, please call [phone] After hours, please call ELink [phone]

## 2023-06-08 NOTE — Progress Notes (Signed)
 PHARMACY - ANTICOAGULATION CONSULT NOTE  Pharmacy Consult for heparin Indication: pulmonary embolus  Labs: Recent Labs    06/07/23 1415 06/07/23 1428 06/07/23 1733 06/08/23 0232  HGB 9.9* 11.9*  --  9.9*  HCT 32.9* 35.0*  --  32.7*  PLT 205  --   --  180  HEPARINUNFRC  --   --   --  0.79*  CREATININE 1.78* 1.90*  --  1.70*  TROPONINIHS 569*  --  510*  --     Assessment: 73yo female supratherapeutic on heparin with initial dosing for PE; no infusion issues or signs of bleeding per RN.  Goal of Therapy:  Heparin level 0.3-0.7 units/ml   Plan:  Decrease heparin infusion by 1-2 units/kg/hr to 1100 units/hr. Check level in 8 hours.   Vernard Gambles, PharmD, BCPS 06/08/2023 3:53 AM

## 2023-06-09 ENCOUNTER — Inpatient Hospital Stay (HOSPITAL_COMMUNITY)

## 2023-06-09 DIAGNOSIS — R570 Cardiogenic shock: Secondary | ICD-10-CM

## 2023-06-09 DIAGNOSIS — Z515 Encounter for palliative care: Secondary | ICD-10-CM | POA: Diagnosis not present

## 2023-06-09 DIAGNOSIS — I5021 Acute systolic (congestive) heart failure: Secondary | ICD-10-CM | POA: Diagnosis not present

## 2023-06-09 DIAGNOSIS — J9601 Acute respiratory failure with hypoxia: Secondary | ICD-10-CM

## 2023-06-09 DIAGNOSIS — I480 Paroxysmal atrial fibrillation: Secondary | ICD-10-CM

## 2023-06-09 DIAGNOSIS — Z66 Do not resuscitate: Secondary | ICD-10-CM | POA: Diagnosis not present

## 2023-06-09 DIAGNOSIS — I469 Cardiac arrest, cause unspecified: Secondary | ICD-10-CM | POA: Diagnosis not present

## 2023-06-09 DIAGNOSIS — N179 Acute kidney failure, unspecified: Secondary | ICD-10-CM | POA: Diagnosis not present

## 2023-06-09 DIAGNOSIS — I509 Heart failure, unspecified: Secondary | ICD-10-CM

## 2023-06-09 DIAGNOSIS — I2693 Single subsegmental pulmonary embolism without acute cor pulmonale: Secondary | ICD-10-CM | POA: Diagnosis not present

## 2023-06-09 DIAGNOSIS — I2699 Other pulmonary embolism without acute cor pulmonale: Secondary | ICD-10-CM | POA: Diagnosis not present

## 2023-06-09 DIAGNOSIS — Z1152 Encounter for screening for COVID-19: Secondary | ICD-10-CM | POA: Diagnosis not present

## 2023-06-09 LAB — CBC WITH DIFFERENTIAL/PLATELET
Abs Immature Granulocytes: 0.15 10*3/uL — ABNORMAL HIGH (ref 0.00–0.07)
Basophils Absolute: 0 10*3/uL (ref 0.0–0.1)
Basophils Relative: 0 %
Eosinophils Absolute: 0 10*3/uL (ref 0.0–0.5)
Eosinophils Relative: 0 %
HCT: 36.5 % (ref 36.0–46.0)
Hemoglobin: 10.6 g/dL — ABNORMAL LOW (ref 12.0–15.0)
Immature Granulocytes: 1 %
Lymphocytes Relative: 3 %
Lymphs Abs: 0.5 10*3/uL — ABNORMAL LOW (ref 0.7–4.0)
MCH: 24.3 pg — ABNORMAL LOW (ref 26.0–34.0)
MCHC: 29 g/dL — ABNORMAL LOW (ref 30.0–36.0)
MCV: 83.5 fL (ref 80.0–100.0)
Monocytes Absolute: 0.8 10*3/uL (ref 0.1–1.0)
Monocytes Relative: 5 %
Neutro Abs: 16.4 10*3/uL — ABNORMAL HIGH (ref 1.7–7.7)
Neutrophils Relative %: 91 %
Platelets: 162 10*3/uL (ref 150–400)
RBC: 4.37 MIL/uL (ref 3.87–5.11)
RDW: 18.7 % — ABNORMAL HIGH (ref 11.5–15.5)
WBC: 17.9 10*3/uL — ABNORMAL HIGH (ref 4.0–10.5)
nRBC: 1.8 % — ABNORMAL HIGH (ref 0.0–0.2)

## 2023-06-09 LAB — COOXEMETRY PANEL
Carboxyhemoglobin: 1.5 % (ref 0.5–1.5)
Methemoglobin: <0.7 % (ref 0.0–1.5)
O2 Saturation: 41.9 %
Total hemoglobin: 10.3 g/dL — ABNORMAL LOW (ref 12.0–16.0)

## 2023-06-09 LAB — POCT I-STAT 7, (LYTES, BLD GAS, ICA,H+H)
Acid-base deficit: 1 mmol/L (ref 0.0–2.0)
Acid-base deficit: 5 mmol/L — ABNORMAL HIGH (ref 0.0–2.0)
Acid-base deficit: 11 mmol/L — ABNORMAL HIGH (ref 0.0–2.0)
Bicarbonate: 27 mmol/L (ref 20.0–28.0)
Bicarbonate: 22.7 mmol/L (ref 20.0–28.0)
Bicarbonate: 15.7 mmol/L — ABNORMAL LOW (ref 20.0–28.0)
Calcium, Ion: 1.06 mmol/L — ABNORMAL LOW (ref 1.15–1.40)
Calcium, Ion: 0.98 mmol/L — ABNORMAL LOW (ref 1.15–1.40)
Calcium, Ion: 0.94 mmol/L — ABNORMAL LOW (ref 1.15–1.40)
HCT: 36 % (ref 36.0–46.0)
HCT: 36 % (ref 36.0–46.0)
HCT: 34 % — ABNORMAL LOW (ref 36.0–46.0)
Hemoglobin: 12.2 g/dL (ref 12.0–15.0)
Hemoglobin: 12.2 g/dL (ref 12.0–15.0)
Hemoglobin: 11.6 g/dL — ABNORMAL LOW (ref 12.0–15.0)
O2 Saturation: 98 %
O2 Saturation: 96 %
O2 Saturation: 97 %
Patient temperature: 37.3
Patient temperature: 36.8
Potassium: 3.6 mmol/L (ref 3.5–5.1)
Potassium: 5.1 mmol/L (ref 3.5–5.1)
Potassium: 7.1 mmol/L (ref 3.5–5.1)
Sodium: 142 mmol/L (ref 135–145)
Sodium: 139 mmol/L (ref 135–145)
Sodium: 134 mmol/L — ABNORMAL LOW (ref 135–145)
TCO2: 29 mmol/L (ref 22–32)
TCO2: 24 mmol/L (ref 22–32)
TCO2: 17 mmol/L — ABNORMAL LOW (ref 22–32)
pCO2 arterial: 62.7 mmHg — ABNORMAL HIGH (ref 32–48)
pCO2 arterial: 51.7 mmHg — ABNORMAL HIGH (ref 32–48)
pCO2 arterial: 36.7 mmHg (ref 32–48)
pH, Arterial: 7.244 — ABNORMAL LOW (ref 7.35–7.45)
pH, Arterial: 7.251 — ABNORMAL LOW (ref 7.35–7.45)
pH, Arterial: 7.239 — ABNORMAL LOW (ref 7.35–7.45)
pO2, Arterial: 129 mmHg — ABNORMAL HIGH (ref 83–108)
pO2, Arterial: 97 mmHg (ref 83–108)
pO2, Arterial: 100 mmHg (ref 83–108)

## 2023-06-09 LAB — TROPONIN I (HIGH SENSITIVITY): Troponin I (High Sensitivity): 2254 ng/L (ref <18)

## 2023-06-09 LAB — BASIC METABOLIC PANEL
Anion gap: 13 (ref 5–15)
BUN: 54 mg/dL — ABNORMAL HIGH (ref 8–23)
CO2: 22 mmol/L (ref 22–32)
Calcium: 7.1 mg/dL — ABNORMAL LOW (ref 8.9–10.3)
Chloride: 104 mmol/L (ref 98–111)
Creatinine, Ser: 2.5 mg/dL — ABNORMAL HIGH (ref 0.44–1.00)
GFR, Estimated: 20 mL/min — ABNORMAL LOW (ref >60)
Glucose, Bld: 114 mg/dL — ABNORMAL HIGH (ref 70–99)
Potassium: 4.5 mmol/L (ref 3.5–5.1)
Sodium: 139 mmol/L (ref 135–145)

## 2023-06-09 LAB — URINALYSIS, ROUTINE W REFLEX MICROSCOPIC
Bilirubin Urine: NEGATIVE
Glucose, UA: NEGATIVE mg/dL
Ketones, ur: NEGATIVE mg/dL
Nitrite: NEGATIVE
Protein, ur: 30 mg/dL — AB
Specific Gravity, Urine: 1.011 (ref 1.005–1.030)
pH: 5 (ref 5.0–8.0)

## 2023-06-09 LAB — GLUCOSE, CAPILLARY
Glucose-Capillary: 104 mg/dL — ABNORMAL HIGH (ref 70–99)
Glucose-Capillary: 105 mg/dL — ABNORMAL HIGH (ref 70–99)
Glucose-Capillary: 81 mg/dL (ref 70–99)
Glucose-Capillary: 89 mg/dL (ref 70–99)

## 2023-06-09 LAB — MAGNESIUM: Magnesium: 2.7 mg/dL — ABNORMAL HIGH (ref 1.7–2.4)

## 2023-06-09 LAB — HEMOGLOBIN A1C
Hgb A1c MFr Bld: 6.3 % — ABNORMAL HIGH (ref 4.8–5.6)
Mean Plasma Glucose: 134.11 mg/dL

## 2023-06-09 LAB — TRIGLYCERIDES: Triglycerides: 95 mg/dL (ref <150)

## 2023-06-09 LAB — PHOSPHORUS: Phosphorus: 6.9 mg/dL — ABNORMAL HIGH (ref 2.5–4.6)

## 2023-06-09 LAB — CG4 I-STAT (LACTIC ACID): Lactic Acid, Venous: 8.5 mmol/L (ref 0.5–1.9)

## 2023-06-09 LAB — LACTIC ACID, PLASMA: Lactic Acid, Venous: 4.2 mmol/L (ref 0.5–1.9)

## 2023-06-09 LAB — HEPARIN LEVEL (UNFRACTIONATED): Heparin Unfractionated: 0.42 [IU]/mL (ref 0.30–0.70)

## 2023-06-09 LAB — MRSA NEXT GEN BY PCR, NASAL: MRSA by PCR Next Gen: NOT DETECTED

## 2023-06-09 MED ORDER — SODIUM CHLORIDE 0.9 % IV SOLN: INTRAVENOUS | Status: DC | PRN

## 2023-06-09 MED ORDER — ROSUVASTATIN CALCIUM 20 MG PO TABS
20.0000 mg | ORAL_TABLET | Freq: Every day | ORAL | Status: DC
  Administered 2023-06-09: 20 mg
  Filled 2023-06-09: qty 1

## 2023-06-09 MED ORDER — GLYCOPYRROLATE 0.2 MG/ML IJ SOLN
0.2000 mg | INTRAMUSCULAR | Status: DC | PRN
  Administered 2023-06-09: 0.2 mg via INTRAVENOUS
  Filled 2023-06-09: qty 1

## 2023-06-09 MED ORDER — SODIUM CHLORIDE 0.9 % IV SOLN: INTRAVENOUS | Status: DC

## 2023-06-09 MED ORDER — CALCIUM GLUCONATE-NACL 2-0.675 GM/100ML-% IV SOLN
2.0000 g | Freq: Once | INTRAVENOUS | Status: AC
  Administered 2023-06-09: 2000 mg via INTRAVENOUS
  Filled 2023-06-09: qty 100

## 2023-06-09 MED ORDER — MIDAZOLAM HCL 2 MG/2ML IJ SOLN
2.0000 mg | INTRAMUSCULAR | Status: DC | PRN
  Filled 2023-06-09: qty 4

## 2023-06-09 MED ORDER — METHOCARBAMOL 500 MG PO TABS
500.0000 mg | ORAL_TABLET | Freq: Four times a day (QID) | ORAL | Status: DC | PRN
Start: 2023-06-09 — End: 2023-06-09

## 2023-06-09 MED ORDER — METHOCARBAMOL 1000 MG/10ML IJ SOLN: 500.0000 mg | Freq: Four times a day (QID) | INTRAMUSCULAR | Status: DC | PRN

## 2023-06-09 MED ORDER — EPINEPHRINE HCL 5 MG/250ML IV SOLN IN NS: 0.5000 ug/min | INTRAVENOUS | Status: DC

## 2023-06-09 MED ORDER — SODIUM CHLORIDE 0.9 % IV SOLN: 2.0000 g | INTRAVENOUS | Status: DC

## 2023-06-09 MED ORDER — EPINEPHRINE 1 MG/10ML IJ SOSY
PREFILLED_SYRINGE | INTRAMUSCULAR | Status: AC
Start: 2023-06-09 — End: 2023-06-09
  Filled 2023-06-09: qty 10

## 2023-06-09 MED ORDER — VASOPRESSIN 20 UNITS/100 ML INFUSION FOR SHOCK
0.0400 [IU]/min | INTRAVENOUS | Status: DC
  Administered 2023-06-09 (×2): 0.04 [IU]/min via INTRAVENOUS
  Filled 2023-06-09 (×2): qty 100

## 2023-06-09 MED ORDER — ACETAMINOPHEN 325 MG PO TABS: 650.0000 mg | ORAL_TABLET | Freq: Four times a day (QID) | ORAL | Status: DC | PRN

## 2023-06-09 MED ORDER — CALCIUM GLUCONATE-NACL 1-0.675 GM/50ML-% IV SOLN
1.0000 g | Freq: Once | INTRAVENOUS | Status: AC
  Administered 2023-06-09: 1000 mg via INTRAVENOUS
  Filled 2023-06-09: qty 50

## 2023-06-09 MED ORDER — HYDROCORTISONE SOD SUC (PF) 100 MG IJ SOLR
100.0000 mg | Freq: Three times a day (TID) | INTRAMUSCULAR | Status: DC
  Administered 2023-06-09: 100 mg via INTRAVENOUS
  Filled 2023-06-09: qty 2

## 2023-06-09 MED ORDER — GLYCOPYRROLATE 1 MG PO TABS: 1.0000 mg | ORAL_TABLET | ORAL | Status: DC | PRN

## 2023-06-09 MED ORDER — FAMOTIDINE 20 MG PO TABS
20.0000 mg | ORAL_TABLET | Freq: Every day | ORAL | Status: DC
  Administered 2023-06-09: 20 mg
  Filled 2023-06-09: qty 1

## 2023-06-09 MED ORDER — ACETAMINOPHEN 650 MG RE SUPP: 650.0000 mg | Freq: Four times a day (QID) | RECTAL | Status: DC | PRN

## 2023-06-09 MED ORDER — EPINEPHRINE HCL 5 MG/250ML IV SOLN IN NS
INTRAVENOUS | Status: AC
Start: 2023-06-09 — End: 2023-06-09
  Administered 2023-06-09: 5 ug/min via INTRAVENOUS
  Filled 2023-06-09: qty 250

## 2023-06-09 MED ORDER — POLYETHYLENE GLYCOL 3350 17 G PO PACK: 17.0000 g | PACK | Freq: Every day | ORAL | Status: DC | PRN

## 2023-06-09 MED ORDER — GLYCOPYRROLATE 0.2 MG/ML IJ SOLN: 0.2000 mg | INTRAMUSCULAR | Status: DC | PRN

## 2023-06-09 MED ORDER — POLYVINYL ALCOHOL 1.4 % OP SOLN: 1.0000 [drp] | Freq: Four times a day (QID) | OPHTHALMIC | Status: DC | PRN

## 2023-06-09 MED ORDER — SODIUM BICARBONATE 8.4 % IV SOLN
100.0000 meq | Freq: Once | INTRAVENOUS | Status: AC
  Administered 2023-06-09: 100 meq via INTRAVENOUS
  Filled 2023-06-09: qty 100

## 2023-06-09 MED ORDER — FENTANYL BOLUS VIA INFUSION: 50.0000 ug | INTRAVENOUS | Status: DC | PRN

## 2023-06-09 MED ORDER — ADULT MULTIVITAMIN W/MINERALS CH
1.0000 | ORAL_TABLET | Freq: Every day | ORAL | Status: DC
  Administered 2023-06-09: 1
  Filled 2023-06-09: qty 1

## 2023-06-12 LAB — CULTURE, BLOOD (ROUTINE X 2)
Culture: NO GROWTH
Culture: NO GROWTH

## 2023-06-17 MED FILL — Medication: Qty: 1 | Status: AC

## 2023-06-23 ENCOUNTER — Ambulatory Visit: Payer: Medicare HMO | Admitting: Cardiology

## 2023-06-24 ENCOUNTER — Ambulatory Visit: Payer: Medicare HMO | Admitting: Cardiovascular Disease

## 2023-06-24 NOTE — Consult Note (Signed)
 NAME:  Alexandra [name], MRN:  [id number], DOB:  Jan 10, 1951, LOS: 2 ADMISSION DATE:  06/07/2023, CONSULTATION DATE:  06/08/2023 REFERRING MD:  Hospitalist, CHIEF COMPLAINT:  Cardiac Arrest   History of Present Illness:  73 y/o female with PMH for non-obstructive coronary artery disease, renal artery stenosis status post stent to right renal artery, lower extremity peripheral arterial disease status post stent placement to right common iliac artery, aortic valve stenosis status post TAVR, probable diastolic heart failure, COPD, hypercholesteremia, hypertension and likely Pulmonary HTN who presented with cough and had a coughing fit that caused her to fall and she suffered right hip fracture.  She was 88% RA when EMS arrived.  She underwent a CTA which revealed segmental/subsegmental Pulmonary Emboli with mild clot burden.  She was started on IV Heparin and admitted, ortho surgery pending.  Her troponins were high and Cardiology was following.  She had Amiodarone drip started but due to bradycardia that was stopped. The patient suddenly became nauseous and possibly vomited (conflicting reports of vomiting/not vomiting); then she had PEA arrest and CPR BLS/ACLS was conducted for 25 min and she received multiple rounds of Epinephrine.  Pertinent  Medical History  non-obstructive coronary artery disease, renal artery stenosis status post stent to right renal artery, lower extremity peripheral arterial disease status post stent placement to right common iliac artery,  aortic valve stenosis status post TAVR,  probable diastolic heart failure,  COPD,  hypercholesteremia,  hypertension   Significant Hospital Events: Including procedures, antibiotic start and stop dates in addition to other pertinent events   3/15: Admitted with PE and hip fracture 3/16: PEA arrest transferred to 2H  Interim History / Subjective:  Patient is requiring multiple vasopressor support, currently on Levophed and  vasopressin Blood pressure keep on dropping Bedside echocardiogram showed severely reduced LV systolic function with normal RV function  Objective   Blood pressure (!) 140/88, pulse 70, temperature 98.4 F (36.9 C), resp. rate (!) 28, height 5\' 5"  (1.651 m), weight 69.4 kg, SpO2 100%. CVP:  [8 mmHg-15 mmHg] 8 mmHg  Vent Mode: PRVC FiO2 (%):  [60 %-100 %] 60 % Set Rate:  [20 bmp-28 bmp] 28 bmp Vt Set:  [340 mL-440 mL] 340 mL PEEP:  [5 cmH20] 5 cmH20 Plateau Pressure:  [11 cmH20] 11 cmH20   Intake/Output Summary (Last 24 hours) at  0932 Last data filed at  0900 Gross per 24 hour  Intake 2215.78 ml  Output 1065 ml  Net 1150.78 ml   Filed Weights   06/08/23 0356 06/08/23 0501  0500  Weight: 62.7 kg 62.7 kg 69.4 kg    Examination: General: Crtitically ill-appearing elderly female, orally intubated HEENT: [place]/AT, eyes anicteric.  ETT and OGT in place Neuro: Sedated, not following commands.  Eyes are closed.  Pupils 3 mm bilateral reactive to light Chest: Reduced air entry at the bases bilaterally Heart: Regular rate and rhythm, no murmurs or gallops Abdomen: Soft, nondistended, bowel sounds present Skin: No rash Extremities: Traction is placed on right lower extremity  Labs and images reviewed  Resolved Hospital Problem list   N/a  Assessment & Plan:  Status post PEA Cardiac Arrest Acute non-ST elevation MI Acute HFrEF with cardiogenic shock Acute bilateral pulmonary emboli Status post fall with right hip fracture Acute kidney injury due to cardiorenal syndrome Acute respiratory failure with hypoxia in the setting of pulmonary edema and pneumonia Mixed metabolic and respiratory acidosis Hyperphosphatemia  Paroxysmal A-fib  Continue supportive care Serum troponins started going up  Bedside echocardiogram showed severely reduced LV systolic function and normal RV function Advanced heart failure team is following Currently on Levophed,  vasopressin Follow-up [place] Started on epinephrine Continue IV heparin infusion Orthopedic is consulting, currently in traction Serum creatinine started going up, she is not making much urine likely due to cardiorenal syndrome Continue lung protective ventilation Trend ABGs Monitor electrolytes Remain in sinus rhythm now  Best Practice (right click and "Reselect all SmartList Selections" daily)   Diet/type: NPO DVT prophylaxis systemic heparin Pressure ulcer(s): N/A GI prophylaxis: H2B Lines: Central line Foley:  N/A Code Status:  DNR   Labs   CBC: Recent Labs  Lab 06/07/23 1415 06/07/23 1428 06/08/23 0232 06/08/23 2034 06/08/23 2036  0226  0400  0524  WBC 15.0*  --  19.9* 16.3*  --   --  17.9*  --   NEUTROABS  --   --  16.9*  --   --   --  16.4*  --   HGB 9.9*   < > 9.9* 9.3* 10.2* 12.2 10.6* 12.2  HCT 32.9*   < > 32.7* 33.4* 30.0* 36.0 36.5 36.0  MCV 81.0  --  80.1 86.3  --   --  83.5  --   PLT 205  --  180 147*  --   --  162  --    < > = values in this interval not displayed.    Basic Metabolic Panel: Recent Labs  Lab 06/07/23 1415 06/07/23 1428 06/08/23 0232 06/08/23 2034 06/08/23 2036  0226  0400  0524  NA 139 137 140 141 140 142 139 139  K 3.9 3.9 3.4* 4.5 4.5 3.6 4.5 5.1  CL 107 109 105 104  --   --  104  --   CO2 17*  --  20* 18*  --   --  22  --   GLUCOSE 95 102* 69* 109*  --   --  114*  --   BUN 41* 39* 44* 49*  --   --  54*  --   CREATININE 1.78* 1.90* 1.70* 2.06*  --   --  2.50*  --   CALCIUM 7.9*  --  7.4* 8.0*  --   --  7.1*  --   MG 2.3  --   --   --   --   --  2.7*  --   PHOS  --   --   --   --   --   --  6.9*  --    GFR: Estimated Creatinine Clearance: 19.3 mL/min (A) (by C-G formula based on SCr of 2.5 mg/dL (H)). Recent Labs  Lab 06/07/23 1415 06/07/23 1458 06/07/23 1757 06/08/23 0232 06/08/23 2034  0400  0406  WBC 15.0*  --   --  19.9* 16.3* 17.9*  --    LATICACIDVEN  --  2.9* 2.0*  --  8.8*  --  4.2*    Liver Function Tests: Recent Labs  Lab 06/07/23 1415  AST 179*  ALT 117*  ALKPHOS 81  BILITOT 2.9*  PROT 5.8*  ALBUMIN 2.9*   Recent Labs  Lab 06/07/23 1415  LIPASE 21   No results for input(s): "AMMONIA" in the last 168 hours.  ABG    Component Value Date/Time   PHART 7.251 (L)  0524   PCO2ART 51.7 (H)  0524   PO2ART 97  0524   HCO3 22.7  0524   TCO2 24  0524   ACIDBASEDEF 5.0 (H)   0524   O2SAT 96  0524     Coagulation Profile: No results for input(s): "INR", "PROTIME" in the last 168 hours.  Cardiac Enzymes: No results for input(s): "CKTOTAL", "CKMB", "CKMBINDEX", "TROPONINI" in the last 168 hours.  HbA1C: Hgb A1c MFr Bld  Date/Time Value Ref Range Status  12/29/2017 02:21 PM 5.5 4.8 - 5.6 % Final    Comment:    (NOTE) Pre diabetes:          5.7%-6.4% Diabetes:              >6.4% Glycemic control for   <7.0% adults with diabetes     CBG: Recent Labs  Lab 06/08/23 0615 06/08/23 1946  0049  0521  0741  GLUCAP 85 119* 104* 105* 89   The patient is critically ill due to status postcardiac arrest/cardiogenic shock.  Critical care was necessary to treat or prevent imminent or life-threatening deterioration.  Critical care was time spent personally by me on the following activities: development of treatment plan with patient and/or surrogate as well as nursing, discussions with consultants, evaluation of patient's response to treatment, examination of patient, obtaining history from patient or surrogate, ordering and performing treatments and interventions, ordering and review of laboratory studies, ordering and review of radiographic studies, pulse oximetry, re-evaluation of patient's condition and participation in multidisciplinary rounds.   During this encounter critical care time was devoted to patient care  services described in this note for 44 minutes.     Cheri Fowler, MD [facility] See Amion for pager If no response to pager, please call [phone] until 7pm After 7pm, Please call E-link [phone]

## 2023-06-24 NOTE — Progress Notes (Signed)
Pt extubated to comfort care measures.

## 2023-06-24 NOTE — IPAL (Signed)
 Interdisciplinary Goals of Care Family Meeting   Date carried out::   Location of the meeting: Bedside  Member's involved: Physician, Bedside Registered Nurse, Pharmacist, and Family Member or next of kin  Durable Power of Attorney or acting medical decision maker: Alexandra [name]    Discussion: We discussed goals of care for Alexandra [name] .    The Clinical status was relayed to patient's husband at bedside in detail.   Updated and notified of patients medical condition.     Patient remains unresponsive and will not open eyes to command.   Patient with increased WOB and using accessory muscles to breathe She has suffered heart damage and possible brain damage after cardiac arrest She has evidence of kidney failure   Patient with Progressive multiorgan failure with a very high probablity of a very minimal chance of meaningful recovery despite all aggressive and optimal medical therapy.  Code status: Full DNR  Disposition: Continue current acute care    Family are satisfied with Plan of action and management. All questions answered   Cheri Fowler MD [facility] See Amion for pager If no response to pager, please call [phone] until 7pm After 7pm, Please call E-link [phone]

## 2023-06-24 NOTE — Progress Notes (Signed)
 Chaplain visited Pt and family in the room. Chaplain offered compassionate presence to Pt's husband, at bedside. Many family members surrounding Pt. Pt's husband shares he is exhausted, and that he has witnessed the attention to his wife's crises last night (code blue). Chaplain invited to reflective listening and trauma support to Pt's husband, to process what he went through last night. Pt's husband also shared about his intention to provide the best care to his wife, but that he needs to bring Pt's sons to discuss a decision for Pt. soon. Chaplain offered help in any way possible. Pt's husband shared he would appreciate if Chaplain could lead the family in a prayer for Pt. Chaplain prayed with family for Pt and let the family know this office will be available in case they need any assistance.  Oneida Alar Chaplain Resident     1400  Spiritual Encounters  Type of Visit Initial  Care provided to: Pt not available;Family  Conversation partners present during encounter Nurse  Referral source Clinical staff  Reason for visit Urgent spiritual support  OnCall Visit No

## 2023-06-24 NOTE — Progress Notes (Signed)
 ANTICOAGULATION CONSULT NOTE  Pharmacy Consult for Heparin Indication: pulmonary embolus  Allergies  Allergen Reactions   Codeine Nausea Only   Symproic [Naldemedine] Nausea And Vomiting    Patient Measurements: Height: 5\' 5"  (165.1 cm) Weight: 69.4 kg (153 lb) IBW/kg (Calculated) : 57 Heparin Dosing Weight: 65.8 kg  Vital Signs: Temp: 98.6 F (37 C) (03/17 0754) Temp Source: Esophageal (03/17 0000) BP: 140/104 (03/17 0754) Pulse Rate: 71 (03/17 0754)  Labs: Recent Labs    06/08/23 0232 06/08/23 1156 06/08/23 2034 06/08/23 2036 06/08/23 2053  0226  0400  0524  0725  HGB 9.9*  --  9.3*   < >  --  12.2 10.6* 12.2  --   HCT 32.7*  --  33.4*   < >  --  36.0 36.5 36.0  --   PLT 180  --  147*  --   --   --  162  --   --   HEPARINUNFRC 0.79* 0.52 0.28*  --   --   --   --   --  0.42  CREATININE 1.70*  --  2.06*  --   --   --  2.50*  --   --   TROPONINIHS  --   --  613*  --  526*  --  2,254*  --   --    < > = values in this interval not displayed.    Estimated Creatinine Clearance: 19.3 mL/min (A) (by C-G formula based on SCr of 2.5 mg/dL (H)).   Medical History: Past Medical History:  Diagnosis Date   Arthritis    COPD (chronic obstructive pulmonary disease) (HCC) 05/22/2016   Severely decreased DLCO by PFTs in April 2019 but only mild obstructive disease.    Coronary artery disease, non-occlusive 11/28/2016   R&LHC: 65% distal LAD lesion -- likely not angiographically significant->medical management. Normal LVEF. Moderately elevated LVEDP. I aortic valve gradient in the Cath Lab, moderate aortic stenosis. This would suggest that the stenosis is on the moderate side of moderate to severe.  Severe pulmonary hypertension (likely mixed).  AVA 0.96 cm; P-P gradient ~ 30 mmHg, Mean ~24.5-27.5 (Moderate AS)    GERD (gastroesophageal reflux disease)    Heart murmur    Hx of adenomatous colonic polyps 10/19/2015   Hyperlipidemia     Hypertension    Resistant hypertension-multiple medications likely partially related to RAS.   Moderate mitral regurgitation by prior echocardiography 10/2016   Echocardiogram revealed moderate mitral regurgitation; most recent Echo 02/12/2019- MAC with moderate MR   Multiple renal cysts 06/2020   Renal A Dopplers: Exophytic Cyst noted in Upper pole of Right Kidney (1.6x1.7x1.7 cm); Exophytic cysts: lower pole of Left Kidney 1.9 x 1.7 x 1.9 cm, and 1.4 x 1.2 x 1.5 cm.   PAOD (peripheral arterial occlusive disease) (HCC) 06/2020   Abd AoGram-BLE Runoff: Severe Bilat RA stenosis - small. atretic L RA. Severe stenosis of R Com Iliac A - Ext Iliac A (PTA-Stent). Bilateral SFA flush CTO. Mod R CFA, L Iliac & CFA. Bilateral SFA reconstitution distal Pop A. R PTA flush CTO 2 V runoff. L LE 3 V runoff unitl distal LPTA CTO.   Small Infrarenal aneurysmal areas with extensive atheroma - no obstructive disease   Pulmonary hypertension associated Aortic Stenosis, Mitral Regurgitation & COPD    Peak PAP by Echo ~54 mmHg;  by Cath PAP/Mean 71/32 mmHg, 53 mmHg (with PCWP & LVEDP ~24-26 mmHg); TPG ~26 mmHg   Pulmonary nodules  Renal artery stenosis, native, bilateral (HCC) 06/2020   Abd Ao Gram: LRA ~ 99% ostial stenosis with small atretic L Kidney (f/u dopplers - Abnormal L kidney size & Resistive Index); R RA ostial 80-90% stenosis (confirmed by doppler Resistive Index, but normal R kidney size.   Renal artery stenosis, native, bilateral (HCC) 06/2020   Abdominal aortic angiogram revealed bilateral renal artery stenosis, however Dopplers confirmed although the left renal artery was stenosed, the kidney itself was atrophied. => R Renal A Stent 05/2013 (Dr. Kirke Corin)   S/P TAVR (transcatheter aortic valve replacement) 12/30/2017   Edward's Sapien 3 bioprosthetic THB via TF approach   Severe aortic stenosis 2018   Underwent TAVR in 2019   Superior mesenteric artery stenosis (HCC) 06/2020   Abdominal Ao Dopplers -  70-90% Ostial SMA stenosis with normal Celiac A.    Medications:  Medications Prior to Admission  Medication Sig Dispense Refill Last Dose/Taking   fluticasone-salmeterol (ADVAIR) 100-50 MCG/ACT AEPB Inhale 1 puff into the lungs 2 (two) times daily.   Taking   albuterol (VENTOLIN HFA) 108 (90 Base) MCG/ACT inhaler Inhale 2 puffs into the lungs every 4 (four) hours as needed.      alendronate (FOSAMAX) 70 MG tablet Take 70 mg by mouth once a week.      aspirin 81 MG tablet Take 1 tablet (81 mg total) by mouth 2 (two) times daily at 10 AM and 5 PM. (Patient taking differently: Take 81 mg by mouth 3 (three) times a week.) 30 tablet 0    bisoprolol-hydrochlorothiazide (ZIAC) 10-6.25 MG tablet TAKE 1 TABLET BY MOUTH DAILY 90 tablet 0    clopidogrel (PLAVIX) 75 MG tablet TAKE 1 TABLET(75 MG) BY MOUTH DAILY (Patient taking differently: Take 75 mg by mouth daily.) 90 tablet 0    cyclobenzaprine (FLEXERIL) 10 MG tablet Take 10 mg by mouth at bedtime.      esomeprazole (NEXIUM) 40 MG capsule TAKE 1 CAPSULE BY MOUTH DAILY AT NOON (Patient taking differently: Take 40 mg by mouth daily at 12 noon.) 90 capsule 3    furosemide (LASIX) 20 MG tablet TAKE 1 TABLET BY MOUTH DAILY AS NEEDED. INCREASE SWELLING (Patient taking differently: Take 20 mg by mouth as needed for fluid.) 30 tablet 6    losartan (COZAAR) 100 MG tablet TAKE 1 TABLET(100 MG) BY MOUTH DAILY (Patient taking differently: Take 100 mg by mouth daily.) 90 tablet 2    oxyCODONE-acetaminophen (PERCOCET) 10-325 MG tablet Take 1 tablet by mouth 5 (five) times daily.      potassium chloride SA (KLOR-CON M) 20 MEQ tablet Take 1 tablet (20 mEq total) by mouth daily. KEEP OV. 30 tablet 1    rosuvastatin (CRESTOR) 20 MG tablet TAKE 1 TABLET(20 MG) BY MOUTH DAILY (Patient taking differently: Take 20 mg by mouth daily.) 90 tablet 3    tiZANidine (ZANAFLEX) 4 MG tablet Take 4 mg by mouth at bedtime as needed for muscle spasms.      Scheduled:   Chlorhexidine  Gluconate Cloth  6 each Topical Daily   docusate  100 mg Per Tube BID   feeding supplement  237 mL Oral BID BM   fentaNYL (SUBLIMAZE) injection  25 mcg Intravenous Once   hydrocortisone sod succinate (SOLU-CORTEF) inj  100 mg Intravenous Q8H   insulin aspart  0-15 Units Subcutaneous Q4H   multivitamin with minerals  1 tablet Oral Daily   mouth rinse  15 mL Mouth Rinse Q2H   oxyCODONE  10 mg Oral Q4H  pantoprazole  40 mg Oral Daily   polyethylene glycol  17 g Per Tube Daily   rosuvastatin  20 mg Oral Daily   sodium chloride flush  10-40 mL Intracatheter Q12H   Infusions:   sodium chloride 10 mL/hr at  0600   sodium chloride     amiodarone Stopped (06/08/23 2318)   azithromycin     cefTRIAXone (ROCEPHIN)  IV Stopped (06/08/23 1828)   fentaNYL infusion INTRAVENOUS 75 mcg/hr ( 0600)   heparin 1,100 Units/hr ( 0600)   norepinephrine (LEVOPHED) Adult infusion 20 mcg/min ( 0600)   propofol (DIPRIVAN) infusion 35 mcg/kg/min ( 0825)   sodium chloride     vasopressin 0.04 Units/min ( 0600)   PRN: Place/Maintain arterial line **AND** sodium chloride, acetaminophen, bisacodyl, fentaNYL, hydrALAZINE, HYDROmorphone (DILAUDID) injection, methocarbamol **OR** methocarbamol (ROBAXIN) injection, naloxone, ondansetron (ZOFRAN) IV, mouth rinse, polyethylene glycol, sodium chloride, sodium chloride flush  Assessment: 73 yof with a history of PAD, SMA stenosis, CAD, COPD, OS, GERD, HLD, HTN, Pulmonary HTN, CHF, paroxysmal atrial tachycardia . Patient is presenting with SOB and leg pain. Heparin per pharmacy consult placed for pulmonary embolus.  CTA w/ bilateral PE without evidence for RHS  Patient is not on anticoagulation prior to arrival.  Heparin paused during ACLS for PEA arrest this evening. Discussed with Dr. Sherryll Burger (CCM), ok to resume after CVC placement.  Heparin drip 1100 uts/hr with heparin level 0.42 at goal, cbc stable no bleeding noted    Goal of Therapy:  Heparin level 0.3-0.7 units/ml Monitor platelets by anticoagulation protocol: Yes   Plan:  Continue heparin 1100 units/h Daily CBC and heparin level  Monitor s/s bleeding     Leota Sauers Pharm.D. CPP, BCPS Clinical Pharmacist [phone]  8:52 AM

## 2023-06-24 NOTE — Death Summary Note (Signed)
 DEATH SUMMARY   Patient Details  Name: Alexandra [name] MRN: [id number] DOB: 06/29/1950  Admission/Discharge Information   Admit Date:  27-Jun-2023  Date of Death: Date of Death: June 29, 2023  Time of Death: Time of Death: 07/04/41  Length of Stay: 2  Referring Physician: [facility]   Reason(s) for Hospitalization  Status post PEA Cardiac Arrest Acute non-ST elevation MI Acute HFrEF with cardiogenic shock Acute bilateral pulmonary emboli Status post fall with right hip fracture Acute kidney injury due to cardiorenal syndrome Acute respiratory failure with hypoxia in the setting of pulmonary edema and pneumonia Mixed metabolic and respiratory acidosis Hyperphosphatemia  Paroxysmal A-fib  Diagnoses  Preliminary cause of death: Withdrawal of care in the setting of multisystem organ failure per patient's family request Secondary Diagnoses (including complications and co-morbidities):  Principal Problem:   Hip fracture (HCC) Active Problems:   Resistant hypertension   Hyperlipidemia with target low density lipoprotein (LDL) cholesterol less than 70 mg/dL   Chronic pain syndrome   COPD (chronic obstructive pulmonary disease) (HCC)   Smoker   Coronary artery disease, non-occlusive   GERD (gastroesophageal reflux disease)   S/P TAVR (transcatheter aortic valve replacement)   PAD (peripheral artery disease) (HCC)   Pulmonary embolism, bilateral (HCC)   Acute on chronic diastolic CHF (congestive heart failure) (HCC)   Right lower lobe pneumonia   AKI (acute kidney injury) (HCC)   Cardiac arrest, cause unspecified (HCC)   Acute on chronic congestive heart failure [facility])   Brief Hospital Course (including significant findings, care, treatment, and services provided and events leading to death)  Alexandra [name] is a 73 y.o. year old female who with PMH for non-obstructive coronary artery disease, renal artery stenosis status post stent to right renal artery,  lower extremity peripheral arterial disease status post stent placement to right common iliac artery, aortic valve stenosis status post TAVR, probable diastolic heart failure, COPD, hypercholesteremia, hypertension and likely Pulmonary HTN who presented with cough and had a coughing fit that caused her to fall and she suffered right hip fracture.  She was 88% RA when EMS arrived.  She underwent a CTA which revealed segmental/subsegmental Pulmonary Emboli with mild clot burden.  She was started on IV Heparin and admitted, ortho surgery pending.  Her troponins were high and Cardiology was following.  She had Amiodarone drip started but due to bradycardia that was stopped. The patient suddenly became nauseous and possibly vomited (conflicting reports of vomiting/not vomiting); then she had PEA arrest and CPR BLS/ACLS was conducted for 25 min and she received multiple rounds of Epinephrine. Patient was admitted to ICU, she was requiring multiple vasopressor support with Levophed, vasopressin still her blood pressure continued to drop, bedside echocardiogram was done which showed severely reduced LV systolic function, advanced heart failure team was consulted, Coox was done it was in 40s, she was started on epinephrine.  She remained an uric, goals of care discussions were carried with family, due to multisystem organ failure patient's family decided to withdraw care and proceed with palliative extubation and comfort care.  Patient was palliatively extubated and she passed on 06-29-2023 at 2:43 PM.  Patient's family was at bedside    Pertinent Labs and Studies  Significant Diagnostic Studies DG CHEST PORT 1 VIEW Result Date: Jun 29, 2023 CLINICAL DATA:  Follow up pleural effusion. EXAM: PORTABLE CHEST 1 VIEW COMPARISON:  Radiographs 06/08/2023 and 2023/06/27.  CT Jun 27, 2023. FINDINGS: 0514 hours. The heart size and mediastinal contours are stable status post  TAVR procedure. The heart remains enlarged. The  endotracheal tube remains low with its tip at the carina. This should be pulled back 4-5 cm for more optimal positioning. Right IJ central venous catheter, enteric tube and mediastinal drains are unchanged in position. No significant change in small right-greater-than-left pleural effusions with associated bibasilar atelectasis. No definite edema or pneumothorax. The bones appear unchanged. Telemetry leads overlie the chest. IMPRESSION: 1. The endotracheal tube remains low with its tip at the carina. This should be pulled back 4-5 cm for more optimal positioning. 2. No significant change in small right-greater-than-left pleural effusions with associated bibasilar atelectasis. 3. These results will be called to the ordering clinician or representative by the Radiologist Assistant, and communication documented in the PACS or [employer]. Electronically Signed   By: Carey Bullocks M.D.   On:  09:59   DG Abd 1 View Result Date: 06/08/2023 CLINICAL DATA:  A G-tube placement EXAM: ABDOMEN - 1 VIEW COMPARISON:  CT abdomen pelvis 06/07/2023 FINDINGS: Enteric tube tip and side-port in the stomach. Remainder unchanged from 06/07/2023 CT given differences in technique. IMPRESSION: Enteric tube tip and side-port in the stomach. Electronically Signed   By: Minerva Fester M.D.   On: 06/08/2023 21:53   DG CHEST PORT 1 VIEW Result Date: 06/08/2023 CLINICAL DATA:  Central line placement and OG tube placement EXAM: PORTABLE CHEST 1 VIEW COMPARISON:  06/07/2023 FINDINGS: Endotracheal tube tip in the intrathoracic trachea 2.3 cm from the carina. Esophageal probe in the midesophagus. Enteric tube tip and side-port in the stomach. Right IJ CVC tip in the right atrium. TAVR. Cardiomegaly. Aortic atherosclerotic calcification. Bilateral interstitial and hazy airspace opacities suggestive of edema. Layering bilateral pleural effusions. No pneumothorax. IMPRESSION: Support apparatus as above. Findings suggestive of  congestive heart failure with bilateral pleural effusions, similar to prior. Electronically Signed   By: Minerva Fester M.D.   On: 06/08/2023 21:52   VAS [place] LOWER EXTREMITY VENOUS (DVT) Result Date: 06/08/2023  Lower Venous DVT Study Patient Name:  CHANIQUE DUCA  Date of Exam:   06/08/2023 Medical Rec #: [id number]       Accession #:    [id number] Date of Birth: 08/29/1950        Patient Gender: F Patient Age:   67 years Exam Location:  [facility] Procedure:      VAS [place] LOWER EXTREMITY VENOUS (DVT) Referring Phys: Jonah Blue --------------------------------------------------------------------------------  Indications: Pulmonary embolism.  Risk Factors: COPD, CAD, HTN, HLD, PAD, pulmonary HTN, B renal artery stenosis, and s/p TAVR. Limitations: Edema, coughing (Valsalva preventing complete compression), pain and poor ultrasound/tissue interface. Comparison Study: No prior study on file Performing Technologist: Sherren Kerns RVS  Examination Guidelines: A complete evaluation includes B-mode imaging, spectral Doppler, color Doppler, and power Doppler as needed of all accessible portions of each vessel. Bilateral testing is considered an integral part of a complete examination. Limited examinations for reoccurring indications may be performed as noted. The reflux portion of the exam is performed with the patient in reverse Trendelenburg.  +--------+---------------+---------+-----------+----------------+-------------+ RIGHT   CompressibilityPhasicitySpontaneityProperties      Thrombus                                                                 Aging         +--------+---------------+---------+-----------+----------------+-------------+  CFV     Full           Yes      No         Pulsatile                                                                Waveforms                     +--------+---------------+---------+-----------+----------------+-------------+ SFJ     Full                                                              +--------+---------------+---------+-----------+----------------+-------------+ FV Prox Full           Yes      No         Pulsatile                                                                Waveforms                     +--------+---------------+---------+-----------+----------------+-------------+ FV Mid  Full           Yes      Yes        Pulsatile                                                                Waveforms                     +--------+---------------+---------+-----------+----------------+-------------+ FV      Full                                                             Distal                                                                   +--------+---------------+---------+-----------+----------------+-------------+ PFV     Full           Yes      No         Pulsatile  Waveforms                     +--------+---------------+---------+-----------+----------------+-------------+ POP     Full           Yes      Yes        Pulsatile                                                                Waveforms                     +--------+---------------+---------+-----------+----------------+-------------+ PTV     Full                                                             +--------+---------------+---------+-----------+----------------+-------------+ PERO    Full                                                             +--------+---------------+---------+-----------+----------------+-------------+   +--------+---------------+---------+-----------+----------------+-------------+ LEFT    CompressibilityPhasicitySpontaneityProperties      Thrombus                                                                 Aging          +--------+---------------+---------+-----------+----------------+-------------+ CFV     Full           Yes      No         Pulsatile                                                                Waveforms                     +--------+---------------+---------+-----------+----------------+-------------+ SFJ     Full                                                             +--------+---------------+---------+-----------+----------------+-------------+ FV Prox Full                                                             +--------+---------------+---------+-----------+----------------+-------------+  FV Mid  Full                                                             +--------+---------------+---------+-----------+----------------+-------------+ FV      Full           Yes      No         Pulsatile                     Distal                                     Waveforms                     +--------+---------------+---------+-----------+----------------+-------------+ PFV     Full                                                             +--------+---------------+---------+-----------+----------------+-------------+ POP     Full           Yes      No         Pulsatile                                                                Waveforms                     +--------+---------------+---------+-----------+----------------+-------------+ PTV     Full                                                             +--------+---------------+---------+-----------+----------------+-------------+ PERO    Full                                                             +--------+---------------+---------+-----------+----------------+-------------+     Summary: RIGHT: - There is no evidence of deep vein thrombosis in the lower extremity.  - No cystic structure found in the popliteal fossa. Pulsatile Waveforms  LEFT: - There is no  evidence of deep vein thrombosis in the lower extremity.  - No cystic structure found in the popliteal fossa. Pulsatile Waveforms.  *See table(s) above for measurements and observations. Electronically signed by Heath Lark on 06/08/2023 at 2:07:03 PM.    Final    ECHOCARDIOGRAM COMPLETE Result Date: 06/08/2023    ECHOCARDIOGRAM REPORT   Patient Name:   AVIANNAH CASTORO Date of Exam:  06/08/2023 Medical Rec #:  [id number]      Height:       65.0 in Accession #:    [id number]     Weight:       138.2 lb Date of Birth:  February 11, 1951       BSA:          1.691 m Patient Age:    73 years       BP:           94/67 mmHg Patient Gender: F              HR:           101 bpm. Exam Location:  Inpatient Procedure: 2D Echo, Color Doppler and Cardiac Doppler (Both Spectral and Color            Flow Doppler were utilized during procedure). Indications:    I26.02 Pulmonary embolus  History:        Patient has prior history of Echocardiogram examinations, most                 recent 08/06/2022. CHF, COPD and Pulmonary HTN; Risk                 Factors:Hypertension and Dyslipidemia.                 Aortic Valve: 23 mm Edwards Sapien 3 Ultra TAVR valve is present                 in the aortic position. Procedure Date: 08/01/19.  Sonographer:    Irving Burton Senior RDCS Referring Phys: [id number] JENNIFER YATES IMPRESSIONS  1. LVEF difficult to quantify due to beat to beat variability but likely is moderately reduced. Left ventricular ejection fraction, by estimation, is 35 to 40%. The left ventricle has moderately decreased function. The left ventricle demonstrates global  hypokinesis. There is moderate concentric left ventricular hypertrophy. Left ventricular diastolic function could not be evaluated.  2. Right ventricular systolic function is normal. The right ventricular size is mildly enlarged. There is severely elevated pulmonary artery systolic pressure. The estimated right ventricular systolic pressure is 65.5 mmHg.  3. Left atrial size was  severely dilated.  4. Right atrial size was severely dilated.  5. The mitral valve is abnormal. Moderate to severe mitral valve regurgitation. Moderate to severe mitral annular calcification.  6. The tricuspid valve is abnormal. Tricuspid valve regurgitation is moderate to severe.  7. The aortic valve has been repaired/replaced. Aortic valve regurgitation is trivial. There is a 23 mm Edwards Sapien 3 Ultra TAVR valve present in the aortic position. Procedure Date: 08/01/19. Echo findings are consistent with normal structure and function of the aortic valve prosthesis. Aortic valve area, by VTI measures 1.46 cm. Aortic valve mean gradient measures 15.0 mmHg. Aortic valve Vmax measures 2.80 m/s.  8. The inferior vena cava is normal in size with <50% respiratory variability, suggesting right atrial pressure of 8 mmHg. FINDINGS  Left Ventricle: LVEF difficult to quantify due to beat to beat variability but likely is moderately reduced. Left ventricular ejection fraction, by estimation, is 35 to 40%. The left ventricle has moderately decreased function. The left ventricle demonstrates global hypokinesis. The left ventricular internal cavity size was normal in size. There is moderate concentric left ventricular hypertrophy. Left ventricular diastolic function could not be evaluated due to atrial fibrillation. Left ventricular diastolic function could not be evaluated. Right Ventricle: The right ventricular size is mildly enlarged. No increase in right  ventricular wall thickness. Right ventricular systolic function is normal. There is severely elevated pulmonary artery systolic pressure. The tricuspid regurgitant velocity is 3.79 m/s, and with an assumed right atrial pressure of 8 mmHg, the estimated right ventricular systolic pressure is 65.5 mmHg. Left Atrium: Left atrial size was severely dilated. Right Atrium: Right atrial size was severely dilated. Pericardium: There is no evidence of pericardial effusion. Mitral  Valve: The mitral valve is abnormal. Moderate to severe mitral annular calcification. Moderate to severe mitral valve regurgitation. Tricuspid Valve: The tricuspid valve is abnormal. Tricuspid valve regurgitation is moderate to severe. The flow in the hepatic veins is reversed during ventricular systole. Aortic Valve: The aortic valve has been repaired/replaced. Aortic valve regurgitation is trivial. Aortic valve mean gradient measures 15.0 mmHg. Aortic valve peak gradient measures 31.4 mmHg. Aortic valve area, by VTI measures 1.46 cm. There is a 23 mm Edwards Sapien 3 Ultra TAVR valve present in the aortic position. Procedure Date: 08/01/19. Echo findings are consistent with normal structure and function of the aortic valve prosthesis. Pulmonic Valve: The pulmonic valve was normal in structure. Pulmonic valve regurgitation is mild. Aorta: The aortic root and ascending aorta are structurally normal, with no evidence of dilitation. Venous: The inferior vena cava is normal in size with less than 50% respiratory variability, suggesting right atrial pressure of 8 mmHg. IAS/Shunts: No atrial level shunt detected by color flow Doppler.  LEFT VENTRICLE PLAX 2D LVIDd:         5.00 cm LVIDs:         3.40 cm LV PW:         1.30 cm LV IVS:        1.30 cm LVOT diam:     2.20 cm LV SV:         64 LV SV Index:   38 LVOT Area:     3.80 cm  LV Volumes (MOD) LV vol d, MOD A2C: 134.0 ml LV vol d, MOD A4C: 116.0 ml LV vol s, MOD A2C: 73.9 ml LV vol s, MOD A4C: 79.3 ml LV SV MOD A2C:     60.1 ml LV SV MOD A4C:     116.0 ml LV SV MOD BP:      47.0 ml RIGHT VENTRICLE RV S prime:     9.68 cm/s TAPSE (M-mode): 1.8 cm LEFT ATRIUM             Index        RIGHT ATRIUM           Index LA diam:        5.50 cm 3.25 cm/m   RA Area:     23.40 cm LA Vol (A2C):   86.9 ml 51.40 ml/m  RA Volume:   78.80 ml  46.61 ml/m LA Vol (A4C):   78.4 ml 46.37 ml/m LA Biplane Vol: 88.4 ml 52.29 ml/m  AORTIC VALVE AV Area (Vmax):    1.52 cm AV Area (Vmean):    1.60 cm AV Area (VTI):     1.46 cm AV Vmax:           280.00 cm/s AV Vmean:          176.000 cm/s AV VTI:            0.438 m AV Peak Grad:      31.4 mmHg AV Mean Grad:      15.0 mmHg LVOT Vmax:         112.17 cm/s LVOT Vmean:  73.900 cm/s LVOT VTI:          0.168 m LVOT/AV VTI ratio: 0.38  AORTA Ao Root diam: 3.30 cm Ao Asc diam:  3.30 cm MR Peak grad:    100.8 mmHg   TRICUSPID VALVE MR Mean grad:    61.0 mmHg    TR Peak grad:   57.5 mmHg MR Vmax:         502.00 cm/s  TR Vmax:        379.00 cm/s MR Vmean:        371.0 cm/s MR PISA:         2.26 cm     SHUNTS MR PISA Eff ROA: 18 mm       Systemic VTI:  0.17 m MR PISA Radius:  0.60 cm      Systemic Diam: 2.20 cm Kardie Tobb DO Electronically signed by Thomasene Ripple DO Signature Date/Time: 06/08/2023/12:13:01 PM    Final    CT Angio Chest PE W/Cm &/Or Wo Cm Result Date: 06/07/2023 CLINICAL DATA:  Concern for pulmonary embolism. Short of breath. RIGHT leg pain. Productive cough EXAM: CT ANGIOGRAPHY CHEST CT ABDOMEN AND PELVIS WITH CONTRAST TECHNIQUE: Multidetector CT imaging of the chest was performed using the standard protocol during bolus administration of intravenous contrast. Multiplanar CT image reconstructions and MIPs were obtained to evaluate the vascular anatomy. Multidetector CT imaging of the abdomen and pelvis was performed using the standard protocol during bolus administration of intravenous contrast. RADIATION DOSE REDUCTION: This exam was performed according to the departmental dose-optimization program which includes automated exposure control, adjustment of the mA and/or kV according to patient size and/or use of iterative reconstruction technique. CONTRAST:  75mL OMNIPAQUE IOHEXOL 350 MG/ML SOLN COMPARISON:  None Available. FINDINGS: CTA CHEST FINDINGS Cardiovascular: There is filling defect within subsegmental branches of the RIGHT lower lobe (image 81 through series 4). These distal segmental filling defects are occlusive. Probable  filling defect within the medial branch of the LEFT lobe pulmonary artery (image 89/series 4. The overall clot burden is mild. No evidence of RIGHT ventricular strain. Upper lung lobes are clear. Mediastinum/Nodes: No axillary or supraclavicular adenopathy. No mediastinal or hilar adenopathy. No pericardial fluid. Esophagus normal. Lungs/Pleura: There is consolidation in the posterior RIGHT lower lobe. There is a moderate size RIGHT effusion. The volume of consolidation appears larger thanexpected from a pulmonary infarction. Favor pneumonia. Mild consolidation atelectasis in the LEFT lower lobe. Small LEFT effusion. Musculoskeletal: No aggressive osseous lesion. Review of the MIP images confirms the above findings. CT ABDOMEN and PELVIS FINDINGS Hepatobiliary: No focal hepatic lesion. Postcholecystectomy. No biliary dilatation. Pancreas: Pancreas is normal. No ductal dilatation. No pancreatic inflammation. Spleen: Normal spleen Adrenals/urinary tract: Adrenal glands normal. The LEFT kidney is atrophic. Small calcification in the RIGHT kidney. Bilateral low-density renal cysts. Ureters and bladder normal. Stomach/Bowel: The stomach, duodenum, and small bowel normal. Multiple diverticula of the descending colon and sigmoid colon without acute inflammation. Vascular/Lymphatic: Abdominal aorta is normal caliber with atherosclerotic calcification. There is no retroperitoneal or periportal lymphadenopathy. No pelvic lymphadenopathy. Reproductive: Uterus and adnexa unremarkable. Other: No free fluid. Musculoskeletal: No aggressive osseous lesion. Review of the MIP images confirms the above findings. IMPRESSION: CHEST: 1. Bilateral lower lobe pulmonary emboli. Mild clot burden. No evidence of RIGHT ventricular strain. 2. Consolidation in the RIGHT lower lobe is favored pneumonia. 3. Moderate size RIGHT effusion. Small LEFT effusion. PELVIS: 1. No acute findings in the abdomen pelvis. 2. Atrophic LEFT kidney. 3.  Aortic  Atherosclerosis (  ICD10-I70.0). Electronically Signed   By: Genevive Bi M.D.   On: 06/07/2023 17:43   CT ABDOMEN PELVIS W CONTRAST Result Date: 06/07/2023 CLINICAL DATA:  Concern for pulmonary embolism. Short of breath. RIGHT leg pain. Productive cough EXAM: CT ANGIOGRAPHY CHEST CT ABDOMEN AND PELVIS WITH CONTRAST TECHNIQUE: Multidetector CT imaging of the chest was performed using the standard protocol during bolus administration of intravenous contrast. Multiplanar CT image reconstructions and MIPs were obtained to evaluate the vascular anatomy. Multidetector CT imaging of the abdomen and pelvis was performed using the standard protocol during bolus administration of intravenous contrast. RADIATION DOSE REDUCTION: This exam was performed according to the departmental dose-optimization program which includes automated exposure control, adjustment of the mA and/or kV according to patient size and/or use of iterative reconstruction technique. CONTRAST:  75mL OMNIPAQUE IOHEXOL 350 MG/ML SOLN COMPARISON:  None Available. FINDINGS: CTA CHEST FINDINGS Cardiovascular: There is filling defect within subsegmental branches of the RIGHT lower lobe (image 81 through series 4). These distal segmental filling defects are occlusive. Probable filling defect within the medial branch of the LEFT lobe pulmonary artery (image 89/series 4. The overall clot burden is mild. No evidence of RIGHT ventricular strain. Upper lung lobes are clear. Mediastinum/Nodes: No axillary or supraclavicular adenopathy. No mediastinal or hilar adenopathy. No pericardial fluid. Esophagus normal. Lungs/Pleura: There is consolidation in the posterior RIGHT lower lobe. There is a moderate size RIGHT effusion. The volume of consolidation appears larger thanexpected from a pulmonary infarction. Favor pneumonia. Mild consolidation atelectasis in the LEFT lower lobe. Small LEFT effusion. Musculoskeletal: No aggressive osseous lesion. Review of the MIP  images confirms the above findings. CT ABDOMEN and PELVIS FINDINGS Hepatobiliary: No focal hepatic lesion. Postcholecystectomy. No biliary dilatation. Pancreas: Pancreas is normal. No ductal dilatation. No pancreatic inflammation. Spleen: Normal spleen Adrenals/urinary tract: Adrenal glands normal. The LEFT kidney is atrophic. Small calcification in the RIGHT kidney. Bilateral low-density renal cysts. Ureters and bladder normal. Stomach/Bowel: The stomach, duodenum, and small bowel normal. Multiple diverticula of the descending colon and sigmoid colon without acute inflammation. Vascular/Lymphatic: Abdominal aorta is normal caliber with atherosclerotic calcification. There is no retroperitoneal or periportal lymphadenopathy. No pelvic lymphadenopathy. Reproductive: Uterus and adnexa unremarkable. Other: No free fluid. Musculoskeletal: No aggressive osseous lesion. Review of the MIP images confirms the above findings. IMPRESSION: CHEST: 1. Bilateral lower lobe pulmonary emboli. Mild clot burden. No evidence of RIGHT ventricular strain. 2. Consolidation in the RIGHT lower lobe is favored pneumonia. 3. Moderate size RIGHT effusion. Small LEFT effusion. PELVIS: 1. No acute findings in the abdomen pelvis. 2. Atrophic LEFT kidney. 3.  Aortic Atherosclerosis (ICD10-I70.0). Electronically Signed   By: Genevive Bi M.D.   On: 06/07/2023 17:43   DG Knee 2 Views Right Result Date: 06/07/2023 CLINICAL DATA:  Larey Seat, right leg pain EXAM: RIGHT KNEE - 1-2 VIEW COMPARISON:  07/07/2019 FINDINGS: Frontal and lateral views of the right knee are obtained. Partial visualization of intramedullary rod within the femur and tibia. No evidence of acute fracture, subluxation, or dislocation. There is mild osteoarthritis of the medial and patellofemoral compartments. No joint effusion. Mild diffuse subcutaneous edema. IMPRESSION: 1. Mild osteoarthritis. 2. No acute fracture. 3. Mild diffuse subcutaneous edema. Electronically Signed   By:  Sharlet Salina M.D.   On: 06/07/2023 15:17   DG Hip Unilat W or Wo Pelvis 2-3 Views Right Result Date: 06/07/2023 CLINICAL DATA:  Larey Seat, right leg pain EXAM: DG HIP (WITH OR WITHOUT PELVIS) 2-3V RIGHT COMPARISON:  08/20/2009 FINDINGS: Frontal view of  the pelvis as well as frontal and cross-table lateral views of the right hip are obtained. There is a comminuted intertrochanteric right hip fracture, with impaction and varus angulation at the fracture site. No dislocation. Mild symmetrical bilateral hip osteoarthritis. The remainder of the bony pelvis is unremarkable. IMPRESSION: 1. Comminuted intertrochanteric right hip fracture, with impaction and varus angulation. Electronically Signed   By: Sharlet Salina M.D.   On: 06/07/2023 15:14   DG Chest 1 View Result Date: 06/07/2023 CLINICAL DATA:  Short of breath, right leg pain, fell 3 days ago, productive cough EXAM: CHEST  1 VIEW COMPARISON:  11/26/2018 FINDINGS: Single frontal view of the chest demonstrates marked enlargement of the cardiac silhouette. Aortic valve prosthesis is noted. There is increased pulmonary vascular congestion. Veiling opacities at the lung bases, right greater than left, consistent with consolidation and/or effusions. No pneumothorax. No acute bony abnormalities. IMPRESSION: 1. Marked enlargement of the cardiac silhouette. 2. Pulmonary vascular congestion, with bibasilar veiling opacities consistent with consolidation and/or effusions. Findings could reflect sequela of congestive heart failure. Electronically Signed   By: Sharlet Salina M.D.   On: 06/07/2023 15:13   VAS US RENAL ARTERY DUPLEX Result Date: 06/06/2023 ABDOMINAL VISCERAL Patient Name:  SASKIA SIMERSON  Date of Exam:   06/04/2023 Medical Rec #: [id number]       Accession #:    [id number] Date of Birth: June 04, 1950        Patient Gender: F Patient Age:   17 years Exam Location:  [facility] Procedure:      VAS US RENAL ARTERY DUPLEX Referring Phys: [id number] MUHAMMAD A ARIDA  -------------------------------------------------------------------------------- Indications: History of renal artery stenosis. Atrophic left kidney. History of              right renal artery intervention. High Risk Factors: Hypertension, hyperlipidemia, current smoker, coronary artery                    disease. Other Factors: S/p TAVR, COPD. Vascular Interventions: Right renal artery stent placed on 06/06/2021. Limitations: Air/bowel gas and respiration variation. Comparison Study: In 04/2022, a renal artery duplex showed velocities consistent                   with 70-99% stenosis in the SMA. No evidence of stenosis in                   the right renal artery, s/p stent placement. Atrophic left                   kidney, with main renal artery not visualized, most likely                   occluded. Performing Technologist: Tyna Jaksch RVT  Examination Guidelines: A complete evaluation includes B-mode imaging, spectral Doppler, color Doppler, and power Doppler as needed of all accessible portions of each vessel. Bilateral testing is considered an integral part of a complete examination. Limited examinations for reoccurring indications may be performed as noted.  Duplex Findings: +--------------------+--------+--------+------+----------------------+ Mesenteric          PSV cm/sEDV cm/sPlaque       Comments        +--------------------+--------+--------+------+----------------------+ Aorta Prox             42                 2.4 cm AP x 2.4 cm TRV +--------------------+--------+--------+------+----------------------+ Aorta Mid  77                 2.5 cm AP x 2.5 cm TRV +--------------------+--------+--------+------+----------------------+ Aorta Distal           66                 2.8 cm AP x 2.9 cm TRV +--------------------+--------+--------+------+----------------------+ Celiac Artery Origin   72      12                                 +--------------------+--------+--------+------+----------------------+ SMA Origin            300      46                                +--------------------+--------+--------+------+----------------------+   +------------------+--------+--------+-------+ Right Renal ArteryPSV cm/sEDV cm/sComment +------------------+--------+--------+-------+ Origin               85      25           +------------------+--------+--------+-------+ Proximal             98      29           +------------------+--------+--------+-------+ Mid                  82      18           +------------------+--------+--------+-------+ Distal               37      11           +------------------+--------+--------+-------+  Technologist observations: Possibly avascular exophytic cystic mass at the upper pole of the right kidney, measuring 2.4 x 2.5 x 2.4 cm. Possibly avascular exophytic septated cystic mass at the lower pole of the left kidney, measuring3.1 x 2.0 x 3.1 cm. +------------+--------+--------+----+-----------+--------+--------+---+ Right KidneyPSV cm/sEDV cm/sRI  Left KidneyPSV cm/sEDV cm/sRI  +------------+--------+--------+----+-----------+--------+--------+---+ Upper Pole  14      6       0.58Upper Pole                     +------------+--------+--------+----+-----------+--------+--------+---+ Mid         12      6       0.                            +------------+--------+--------+----+-----------+--------+--------+---+ Lower Pole  9       4       0.55Lower Pole                     +------------+--------+--------+----+-----------+--------+--------+---+ Hilar       36      11      0.69Hilar                          +------------+--------+--------+----+-----------+--------+--------+---+ +------------------+------+------------------+------+ Right Kidney            Left Kidney              +------------------+------+------------------+------+ RAR                      RAR                      +------------------+------+------------------+------+  RAR (manual)      2.33  RAR (manual)             +------------------+------+------------------+------+ Cortex            .88 cmCortex            .35 cm +------------------+------+------------------+------+ Cortex thickness        Corex thickness          +------------------+------+------------------+------+ Kidney length (cm)10.82 Kidney length (cm)6.86   +------------------+------+------------------+------+  Summary: Largest Aortic Diameter: Abnormal tapering of the abdominal aorta. The distal abdominal aorta is ectatic, measuring 2.9 cm  Renal:  Right: Normal size right kidney. Normal right Resistive Index.        Normal cortical thickness of right kidney. No evidence of        right renal artery stenosis. RRV flow present. Cyst(s) noted. Left:  Abnormal size for the left kidney. Normal cortical thickness        of the left kidney. Cyst(s) noted. Possibly avascular        exophytic septated cystic mass at the lower pole of the left        kidney, measuring 3.1 x 2.0 x 3.1 cm. Atrophic kidney with        decreased parenchymal flow. Renal artery appears occluded. Mesenteric: Normal Celiac artery findings. 70 to 99% stenosis in the superior mesenteric artery.  Patent IVC.  *See table(s) above for measurements and observations.  Diagnosing physician: Dina Rich MD  Electronically signed by Dina Rich MD on 06/06/2023 at 1:53:29 PM.    Final    VAS US CAROTID Result Date: 06/06/2023 Carotid Arterial Duplex Study Patient Name:  REVER PICHETTE  Date of Exam:   06/04/2023 Medical Rec #: [id number]       Accession #:    [id number] Date of Birth: 1950-12-22        Patient Gender: F Patient Age:   59 years Exam Location:  [facility] Procedure:      VAS US CAROTID Referring Phys: Jerolyn Center ARIDA --------------------------------------------------------------------------------  Indications:       Carotid  artery disease and patient denies any cerebrovascular                    symptoms. Risk Factors:      Hypertension, hyperlipidemia, current smoker, coronary artery                    disease, PAD. Other Factors:     S/p TAVR, COPD. Comparison Study:  In 04/2022, a carotid duplex showed velocities of 58/14 cm/s                    in the RICA and 225/47 cm/s in the LICA. Performing Technologist: Tyna Jaksch RVT  Examination Guidelines: A complete evaluation includes B-mode imaging, spectral Doppler, color Doppler, and power Doppler as needed of all accessible portions of each vessel. Bilateral testing is considered an integral part of a complete examination. Limited examinations for reoccurring indications may be performed as noted.  Right Carotid Findings: +----------+-------+--------+--------+-----------------+-----------------------+           PSV    EDV cm/sStenosisPlaque           Comments                          cm/s                   Description                              +----------+-------+--------+--------+-----------------+-----------------------+  CCA Prox  42     9               heterogenous                             +----------+-------+--------+--------+-----------------+-----------------------+ CCA Distal41     10                                                       +----------+-------+--------+--------+-----------------+-----------------------+ ICA Prox  36     13              hyperechoic                              +----------+-------+--------+--------+-----------------+-----------------------+ ICA Mid   53     14      1-39%   hyperechoic                              +----------+-------+--------+--------+-----------------+-----------------------+ ICA Distal83     21                               serpentine and                                                            turbulent                +----------+-------+--------+--------+-----------------+-----------------------+ ECA       48     7                                                        +----------+-------+--------+--------+-----------------+-----------------------+ +----------+--------+-------+---------+-------------------+           PSV cm/sEDV cmsDescribe Arm Pressure (mmHG) +----------+--------+-------+---------+-------------------+ Subclavian140            Turbulent154                 +----------+--------+-------+---------+-------------------+ +---------+--------+--+--------+--+---------+ VertebralPSV cm/s77EDV cm/s16Antegrade +---------+--------+--+--------+--+---------+  Left Carotid Findings: +----------+--------+--------+--------+------------------+---------------------+           PSV cm/sEDV cm/sStenosisPlaque DescriptionComments              +----------+--------+--------+--------+------------------+---------------------+ CCA Prox  37      5                                 tortuous              +----------+--------+--------+--------+------------------+---------------------+ CCA Distal37      8                                                       +----------+--------+--------+--------+------------------+---------------------+  ICA Prox  227     36      40-59%  calcific          shadowing, stenosis                                                       based on peak                                                             systolic velocities   +----------+--------+--------+--------+------------------+---------------------+ ICA Mid   182     31              calcific          Shadowing             +----------+--------+--------+--------+------------------+---------------------+ ICA Distal41      10                                serpentine            +----------+--------+--------+--------+------------------+---------------------+ ECA       338     32       >50%    calcific                                +----------+--------+--------+--------+------------------+---------------------+ +----------+--------+--------+-----------------------------+-------------------+           PSV cm/sEDV cm/sDescribe                     Arm Pressure (mmHG) +----------+--------+--------+-----------------------------+-------------------+ Subclavian191             turbulent, unable to         120                                           duplicate elevated velocities                                              of 309 cm/s from prior exam                      +----------+--------+--------+-----------------------------+-------------------+ +---------+--------+--+--------+-+----------+ VertebralPSV cm/s81EDV cm/s0Retrograde +---------+--------+--+--------+-+----------+   Summary: Right Carotid: Velocities in the right ICA are consistent with a 1-39% stenosis. Left Carotid: Velocities in the left ICA are consistent with a 40-59% stenosis.               The ECA appears >50% stenosed. LICA stenosis based on peak               systolic velocities. Vertebrals:  Right vertebral artery demonstrates antegrade flow. Left vertebral              artery demonstrates retrograde flow. Subclavians: Bilateral subclavian artery flow was disturbed. *See table(s) above for measurements and  observations. Suggest follow up study in 12 months.  Retrograde flow in the left vertebral artery. Brisk triphasic flow in the right brachial artery; dampened monophasic flow on the left. There is a 34 mmHg pressure difference between the arms, the left being the lowest. Consider left subclavian steal. Electronically signed by Dina Rich MD on 06/06/2023 at 1:39:47 PM.    Final     Microbiology Recent Results (from the past 240 hours)  Resp panel by RT-PCR (RSV, Flu A&B, Covid) Anterior Nasal Swab     Status: None   Collection Time: 06/07/23  2:31 PM   Specimen: Anterior Nasal Swab   Result Value Ref Range Status   SARS Coronavirus 2 by RT PCR NEGATIVE NEGATIVE Final   Influenza A by PCR NEGATIVE NEGATIVE Final   Influenza B by PCR NEGATIVE NEGATIVE Final    Comment: (NOTE) The Xpert Xpress SARS-CoV-2/FLU/RSV plus assay is intended as an aid in the diagnosis of influenza from Nasopharyngeal swab specimens and should not be used as a sole basis for treatment. Nasal washings and aspirates are unacceptable for Xpert Xpress SARS-CoV-2/FLU/RSV testing.  Fact Sheet for Patients: BloggerCourse.com  Fact Sheet for Healthcare Providers: SeriousBroker.it  This test is not yet approved or cleared by the [place] FDA and has been authorized for detection and/or diagnosis of SARS-CoV-2 by FDA under an Emergency Use Authorization (EUA). This EUA will remain in effect (meaning this test can be used) for the duration of the COVID-19 declaration under Section 564(b)(1) of the Act, 21 U.S.C. section 360bbb-3(b)(1), unless the authorization is terminated or revoked.     Resp Syncytial Virus by PCR NEGATIVE NEGATIVE Final    Comment: (NOTE) Fact Sheet for Patients: BloggerCourse.com  Fact Sheet for Healthcare Providers: SeriousBroker.it  This test is not yet approved or cleared by the [place] FDA and has been authorized for detection and/or diagnosis of SARS-CoV-2 by FDA under an Emergency Use Authorization (EUA). This EUA will remain in effect (meaning this test can be used) for the duration of the COVID-19 declaration under Section 564(b)(1) of the Act, 21 U.S.C. section 360bbb-3(b)(1), unless the authorization is terminated or revoked.  Performed at [facility], [address] [place], [place] [address]   Blood culture (routine x 2)     Status: None (Preliminary result)   Collection Time: 06/07/23  2:52 PM   Specimen: BLOOD LEFT FOREARM  Result  Value Ref Range Status   Specimen Description BLOOD LEFT FOREARM  Final   Special Requests   Final    BOTTLES DRAWN AEROBIC AND ANAEROBIC Blood Culture results may not be optimal due to an inadequate volume of blood received in culture bottles   Culture   Final    NO GROWTH 2 DAYS Performed at [facility], [address] [place], [place] [address]    Report Status PENDING  Incomplete  Blood culture (routine x 2)     Status: None (Preliminary result)   Collection Time: 06/07/23  2:57 PM   Specimen: BLOOD RIGHT FOREARM  Result Value Ref Range Status   Specimen Description BLOOD RIGHT FOREARM  Final   Special Requests   Final    BOTTLES DRAWN AEROBIC AND ANAEROBIC Blood Culture results may not be optimal due to an inadequate volume of blood received in culture bottles   Culture   Final    NO GROWTH 2 DAYS Performed at [facility], [address] [place], [place] [address]  Report Status PENDING  Incomplete  MRSA Next Gen by PCR, Nasal     Status: None   Collection Time:  10:48 AM   Specimen: Nasal Mucosa; Nasal Swab  Result Value Ref Range Status   MRSA by PCR Next Gen NOT DETECTED NOT DETECTED Final    Comment: (NOTE) The GeneXpert MRSA Assay (FDA approved for NASAL specimens only), is one component of a comprehensive MRSA colonization surveillance program. It is not intended to diagnose MRSA infection nor to guide or monitor treatment for MRSA infections. Test performance is not FDA approved in patients less than 24 years old. Performed at [facility], [address], [place], [place] [address]     Lab Basic Metabolic Panel: Recent Labs  Lab 06/07/23 1415 06/07/23 1428 06/08/23 0232 06/08/23 2034 06/08/23 2036  0226  0400  0524  1029  NA 139 137 140 141 140 142 139 139 134*  K 3.9 3.9 3.4* 4.5 4.5 3.6 4.5 5.1 7.1*  CL 107 109 105 104  --   --  104  --   --   CO2 17*  --  20* 18*  --   --  22  --   --    GLUCOSE 95 102* 69* 109*  --   --  114*  --   --   BUN 41* 39* 44* 49*  --   --  54*  --   --   CREATININE 1.78* 1.90* 1.70* 2.06*  --   --  2.50*  --   --   CALCIUM 7.9*  --  7.4* 8.0*  --   --  7.1*  --   --   MG 2.3  --   --   --   --   --  2.7*  --   --   PHOS  --   --   --   --   --   --  6.9*  --   --    Liver Function Tests: Recent Labs  Lab 06/07/23 1415  AST 179*  ALT 117*  ALKPHOS 81  BILITOT 2.9*  PROT 5.8*  ALBUMIN 2.9*   Recent Labs  Lab 06/07/23 1415  LIPASE 21   No results for input(s): "AMMONIA" in the last 168 hours. CBC: Recent Labs  Lab 06/07/23 1415 06/07/23 1428 06/08/23 0232 06/08/23 2034 06/08/23 2036  0226  0400  0524  1029  WBC 15.0*  --  19.9* 16.3*  --   --  17.9*  --   --   NEUTROABS  --   --  16.9*  --   --   --  16.4*  --   --   HGB 9.9*   < > 9.9* 9.3* 10.2* 12.2 10.6* 12.2 11.6*  HCT 32.9*   < > 32.7* 33.4* 30.0* 36.0 36.5 36.0 34.0*  MCV 81.0  --  80.1 86.3  --   --  83.5  --   --   PLT 205  --  180 147*  --   --  162  --   --    < > = values in this interval not displayed.   Cardiac Enzymes: No results for input(s): "CKTOTAL", "CKMB", "CKMBINDEX", "TROPONINI" in the last 168 hours. Sepsis Labs: Recent Labs  Lab 06/07/23 1415 06/07/23 1458 06/07/23 1757 06/08/23 0232 06/08/23 2034  0400  0406  1034  WBC 15.0*  --   --  19.9* 16.3* 17.9*  --   --  LATICACIDVEN  --    < > 2.0*  --  8.8*  --  4.2* 8.5*   < > = values in this interval not displayed.    Procedures/Operations     [employer] , 4:30 PM

## 2023-06-24 NOTE — Procedures (Signed)
 Arterial Catheter Insertion Procedure Note  Alexandra [name]  [id number]  1950-12-12  Date:  Time:2:30 AM    Provider Performing: Benjamine Sprague, BS, RRT-ACCS, RCP   Procedure: Insertion of Arterial Line (40981) without US guidance  Indication(s) Blood pressure monitoring and/or need for frequent ABGs  Consent Unable to obtain consent due to emergent nature of procedure.  Anesthesia None   Time Out Verified patient identification, verified procedure, site/side was marked, verified correct patient position, special equipment/implants available, medications/allergies/relevant history reviewed, required imaging and test results available.   Sterile Technique Maximal sterile technique including full sterile barrier drape, hand hygiene, sterile gown, sterile gloves, mask, hair covering, sterile ultrasound probe cover (if used).   Procedure Description Area of catheter insertion was cleaned with chlorhexidine and draped in sterile fashion. Without real-time ultrasound guidance an arterial catheter was placed into the left radial artery.  Appropriate arterial tracings confirmed on monitor.     Complications/Tolerance None; patient tolerated the procedure well.  Alexandra [name], BS, RRT-ACCS, RCP

## 2023-06-24 NOTE — Progress Notes (Signed)
 Heart Failure Navigator Progress Note  Assessed for Heart & Vascular TOC clinic readiness.  Patient does not meet criteria due to Advanced Heart Failure Team patient of Dr. Shirlee Latch.   Navigator will sign off at this time.    Rhae Hammock, BSN, Scientist, clinical (histocompatibility and immunogenetics) Only

## 2023-06-24 NOTE — Progress Notes (Signed)
 Rounding Note    Patient Name: Alexandra [name] Date of Encounter:   [facility] Cardiologist: Bryan Lemma, MD   Subjective   Alexandra [name] is a 73 y.o. female with a history of non-obstructive coronary artery disease (cath 2019 - LAD 40%distal, RCA 30% prox), refractory HTN s/p stent to right renal artery, PAD status post stent placement to right common iliac artery, aortic valve stenosis status post TAVR, diastolic heart failure, pulmonary HTN, COPD. Echo 5/24 EF 55% Consult for elevated trop.   Patient admitted with fall and SOB.  Found to have R hip fracture. CT chest with small bilateral PE and RLL PNA/effusion. (569 -> 510 ->-> 526). Treated with heparin and abx. Placed on IV amio due to h/o atrial arrhythmias. Developed bradycardia and amio stopped.   Echo 3/16 (pre-code) EF 30-35% global HK RV moderately down Mod MR/TR RVSP 65  Overnight patient had PEA arrest with 25 mins of CPR. Now intubated/sedated. Hstrop 2,254 this am   Remains intubated/sedated on NE 20 VP 0.04   Lactic acid 8.8 -> 4.2  ABG 7.25/52/9796%   Continues to deteriorate with dropping pressure. Now starting Epi. Pulpse pressure < 10   Repeat echo at Bedside LVEF 10% RV moderately down     Inpatient Medications    Scheduled Meds:  Chlorhexidine Gluconate Cloth  6 each Topical Daily   docusate  100 mg Per Tube BID   feeding supplement  237 mL Oral BID BM   fentaNYL (SUBLIMAZE) injection  25 mcg Intravenous Once   hydrocortisone sod succinate (SOLU-CORTEF) inj  100 mg Intravenous Q8H   insulin aspart  0-15 Units Subcutaneous Q4H   multivitamin with minerals  1 tablet Oral Daily   mouth rinse  15 mL Mouth Rinse Q2H   oxyCODONE  10 mg Oral Q4H   pantoprazole  40 mg Oral Daily   polyethylene glycol  17 g Per Tube Daily   rosuvastatin  20 mg Oral Daily   sodium chloride flush  10-40 mL Intracatheter Q12H   Continuous Infusions:  sodium chloride 10 mL/hr at  0600    sodium chloride     amiodarone Stopped (06/08/23 2318)   azithromycin     cefTRIAXone (ROCEPHIN)  IV Stopped (06/08/23 1828)   fentaNYL infusion INTRAVENOUS 75 mcg/hr ( 0600)   heparin 1,100 Units/hr ( 0600)   norepinephrine (LEVOPHED) Adult infusion 20 mcg/min ( 0600)   propofol (DIPRIVAN) infusion 35 mcg/kg/min ( 0825)   sodium chloride     vasopressin 0.04 Units/min ( 0600)   PRN Meds: Place/Maintain arterial line **AND** sodium chloride, acetaminophen, bisacodyl, fentaNYL, hydrALAZINE, HYDROmorphone (DILAUDID) injection, methocarbamol **OR** methocarbamol (ROBAXIN) injection, naloxone, ondansetron (ZOFRAN) IV, mouth rinse, polyethylene glycol, sodium chloride, sodium chloride flush   Vital Signs    Vitals:    0545  0550  0600  0754  BP:    (!) 140/104  Pulse:  62 66 71  Resp: (!) 28 (!) 28 (!) 28 (!) 28  Temp: 98.4 F (36.9 C) 98.4 F (36.9 C) 98.2 F (36.8 C) 98.6 F (37 C)  TempSrc:      SpO2:  100% 100% 100%  Weight:      Height:        Intake/Output Summary (Last 24 hours) at  0843 Last data filed at  0600 Gross per 24 hour  Intake 1769.86 ml  Output 1065 ml  Net 704.86 ml          5:00 AM 06/08/2023  5:01 AM 06/08/2023    3:56 AM  Last 3 Weights  Weight (lbs) 153 lb 138 lb 3.7 oz 138 lb 3.7 oz  Weight (kg) 69.4 kg 62.7 kg 62.7 kg      Telemetry    Sinus 60-70s Personally reviewed  ECG    Sinus LBBB Personally reviewed  Physical Exam   General:  Ill appearing. Intubated/sedated HEENT: normal pinpoint pupils + ETT Neck: supple. + internal jugular TLC Cor: PMI nondisplaced. Regular rate & rhythm. No rubs, gallops or murmurs. Lungs: coarse Abdomen: soft, nontender, nondistended. No hepatosplenomegaly. No bruits or masses. Good bowel sounds. Extremities: no cyanosis, clubbing, rash, edema Neuro: unresponsive   Labs    High Sensitivity Troponin:    Recent Labs  Lab 06/07/23 1415 06/07/23 1733 06/08/23 2034 06/08/23 2053  0400  TROPONINIHS 569* 510* 613* 526* 2,254*     Chemistry Recent Labs  Lab 06/07/23 1415 06/07/23 1428 06/08/23 0232 06/08/23 2034 06/08/23 2036  0226  0400  0524  NA 139   < > 140 141   < > 142 139 139  K 3.9   < > 3.4* 4.5   < > 3.6 4.5 5.1  CL 107   < > 105 104  --   --  104  --   CO2 17*  --  20* 18*  --   --  22  --   GLUCOSE 95   < > 69* 109*  --   --  114*  --   BUN 41*   < > 44* 49*  --   --  54*  --   CREATININE 1.78*   < > 1.70* 2.06*  --   --  2.50*  --   CALCIUM 7.9*  --  7.4* 8.0*  --   --  7.1*  --   MG 2.3  --   --   --   --   --  2.7*  --   PROT 5.8*  --   --   --   --   --   --   --   ALBUMIN 2.9*  --   --   --   --   --   --   --   AST 179*  --   --   --   --   --   --   --   ALT 117*  --   --   --   --   --   --   --   ALKPHOS 81  --   --   --   --   --   --   --   BILITOT 2.9*  --   --   --   --   --   --   --   GFRNONAA 30*  --  31* 25*  --   --  20*  --   ANIONGAP 15  --  15 19*  --   --  13  --    < > = values in this interval not displayed.    Lipids  Recent Labs  Lab  0400  TRIG 95    Hematology Recent Labs  Lab 06/08/23 0232 06/08/23 2034 06/08/23 2036  0226  0400  0524  WBC 19.9* 16.3*  --   --  17.9*  --   RBC 4.08 3.87  --   --  4.37  --   HGB 9.9* 9.3*   < > 12.2 10.6*  12.2  HCT 32.7* 33.4*   < > 36.0 36.5 36.0  MCV 80.1 86.3  --   --  83.5  --   MCH 24.3* 24.0*  --   --  24.3*  --   MCHC 30.3 27.8*  --   --  29.0*  --   RDW 18.7* 18.9*  --   --  18.7*  --   PLT 180 147*  --   --  162  --    < > = values in this interval not displayed.   Thyroid No results for input(s): "TSH", "FREET4" in the last 168 hours.  BNP Recent Labs  Lab 06/07/23 1415  BNP >4,500.0*    DDimer No results for input(s): "DDIMER" in the last 168 hours.   Radiology    DG Abd 1 View Result Date:  06/08/2023 CLINICAL DATA:  A G-tube placement EXAM: ABDOMEN - 1 VIEW COMPARISON:  CT abdomen pelvis 06/07/2023 FINDINGS: Enteric tube tip and side-port in the stomach. Remainder unchanged from 06/07/2023 CT given differences in technique. IMPRESSION: Enteric tube tip and side-port in the stomach. Electronically Signed   By: Minerva Fester M.D.   On: 06/08/2023 21:53   DG CHEST PORT 1 VIEW Result Date: 06/08/2023 CLINICAL DATA:  Central line placement and OG tube placement EXAM: PORTABLE CHEST 1 VIEW COMPARISON:  06/07/2023 FINDINGS: Endotracheal tube tip in the intrathoracic trachea 2.3 cm from the carina. Esophageal probe in the midesophagus. Enteric tube tip and side-port in the stomach. Right IJ CVC tip in the right atrium. TAVR. Cardiomegaly. Aortic atherosclerotic calcification. Bilateral interstitial and hazy airspace opacities suggestive of edema. Layering bilateral pleural effusions. No pneumothorax. IMPRESSION: Support apparatus as above. Findings suggestive of congestive heart failure with bilateral pleural effusions, similar to prior. Electronically Signed   By: Minerva Fester M.D.   On: 06/08/2023 21:52   VAS [place] LOWER EXTREMITY VENOUS (DVT) Result Date: 06/08/2023  Lower Venous DVT Study Patient Name:  Alexandra [name]  Date of Exam:   06/08/2023 Medical Rec #: [id number]       Accession #:    [id number] Date of Birth: 18-Mar-1951        Patient Gender: F Patient Age:   87 years Exam Location:  [facility] Procedure:      VAS [place] LOWER EXTREMITY VENOUS (DVT) Referring Phys: Jonah Blue --------------------------------------------------------------------------------  Indications: Pulmonary embolism.  Risk Factors: COPD, CAD, HTN, HLD, PAD, pulmonary HTN, B renal artery stenosis, and s/p TAVR. Limitations: Edema, coughing (Valsalva preventing complete compression), pain and poor ultrasound/tissue interface. Comparison Study: No prior study on file Performing Technologist: Sherren Kerns RVS   Examination Guidelines: A complete evaluation includes B-mode imaging, spectral Doppler, color Doppler, and power Doppler as needed of all accessible portions of each vessel. Bilateral testing is considered an integral part of a complete examination. Limited examinations for reoccurring indications may be performed as noted. The reflux portion of the exam is performed with the patient in reverse Trendelenburg.  +--------+---------------+---------+-----------+----------------+-------------+ RIGHT   CompressibilityPhasicitySpontaneityProperties      Thrombus                                                                 Aging         +--------+---------------+---------+-----------+----------------+-------------+ CFV  Full           Yes      No         Pulsatile                                                                Waveforms                     +--------+---------------+---------+-----------+----------------+-------------+ SFJ     Full                                                             +--------+---------------+---------+-----------+----------------+-------------+ FV Prox Full           Yes      No         Pulsatile                                                                Waveforms                     +--------+---------------+---------+-----------+----------------+-------------+ FV Mid  Full           Yes      Yes        Pulsatile                                                                Waveforms                     +--------+---------------+---------+-----------+----------------+-------------+ FV      Full                                                             Distal                                                                   +--------+---------------+---------+-----------+----------------+-------------+ PFV     Full           Yes      No         Pulsatile  Waveforms                     +--------+---------------+---------+-----------+----------------+-------------+ POP     Full           Yes      Yes        Pulsatile                                                                Waveforms                     +--------+---------------+---------+-----------+----------------+-------------+ PTV     Full                                                             +--------+---------------+---------+-----------+----------------+-------------+ PERO    Full                                                             +--------+---------------+---------+-----------+----------------+-------------+   +--------+---------------+---------+-----------+----------------+-------------+ LEFT    CompressibilityPhasicitySpontaneityProperties      Thrombus                                                                 Aging         +--------+---------------+---------+-----------+----------------+-------------+ CFV     Full           Yes      No         Pulsatile                                                                Waveforms                     +--------+---------------+---------+-----------+----------------+-------------+ SFJ     Full                                                             +--------+---------------+---------+-----------+----------------+-------------+ FV Prox Full                                                             +--------+---------------+---------+-----------+----------------+-------------+  FV Mid  Full                                                             +--------+---------------+---------+-----------+----------------+-------------+ FV      Full           Yes      No         Pulsatile                     Distal                                     Waveforms                      +--------+---------------+---------+-----------+----------------+-------------+ PFV     Full                                                             +--------+---------------+---------+-----------+----------------+-------------+ POP     Full           Yes      No         Pulsatile                                                                Waveforms                     +--------+---------------+---------+-----------+----------------+-------------+ PTV     Full                                                             +--------+---------------+---------+-----------+----------------+-------------+ PERO    Full                                                             +--------+---------------+---------+-----------+----------------+-------------+     Summary: RIGHT: - There is no evidence of deep vein thrombosis in the lower extremity.  - No cystic structure found in the popliteal fossa. Pulsatile Waveforms  LEFT: - There is no evidence of deep vein thrombosis in the lower extremity.  - No cystic structure found in the popliteal fossa. Pulsatile Waveforms.  *See table(s) above for measurements and observations. Electronically signed by Heath Lark on 06/08/2023 at 2:07:03 PM.    Final    ECHOCARDIOGRAM COMPLETE Result Date: 06/08/2023    ECHOCARDIOGRAM REPORT   Patient Name:   Alexandra [name] Date of Exam:  06/08/2023 Medical Rec #:  [id number]      Height:       65.0 in Accession #:    [id number]     Weight:       138.2 lb Date of Birth:  [birth year]       BSA:          1.691 m Patient Age:    73 years       BP:           94/67 mmHg Patient Gender: F              HR:           101 bpm. Exam Location:  Inpatient Procedure: 2D Echo, Color Doppler and Cardiac Doppler (Both Spectral and Color            Flow Doppler were utilized during procedure). Indications:    I26.02 Pulmonary embolus  History:        Patient has prior history of Echocardiogram examinations, most                  recent 08/06/2022. CHF, COPD and Pulmonary HTN; Risk                 Factors:Hypertension and Dyslipidemia.                 Aortic Valve: 23 mm Edwards Sapien 3 Ultra TAVR valve is present                 in the aortic position. Procedure Date: 08/01/19.  Sonographer:    Irving Burton Senior RDCS Referring Phys: [id number] JENNIFER YATES IMPRESSIONS  1. LVEF difficult to quantify due to beat to beat variability but likely is moderately reduced. Left ventricular ejection fraction, by estimation, is 35 to 40%. The left ventricle has moderately decreased function. The left ventricle demonstrates global  hypokinesis. There is moderate concentric left ventricular hypertrophy. Left ventricular diastolic function could not be evaluated.  2. Right ventricular systolic function is normal. The right ventricular size is mildly enlarged. There is severely elevated pulmonary artery systolic pressure. The estimated right ventricular systolic pressure is 65.5 mmHg.  3. Left atrial size was severely dilated.  4. Right atrial size was severely dilated.  5. The mitral valve is abnormal. Moderate to severe mitral valve regurgitation. Moderate to severe mitral annular calcification.  6. The tricuspid valve is abnormal. Tricuspid valve regurgitation is moderate to severe.  7. The aortic valve has been repaired/replaced. Aortic valve regurgitation is trivial. There is a 23 mm Edwards Sapien 3 Ultra TAVR valve present in the aortic position. Procedure Date: 08/01/19. Echo findings are consistent with normal structure and function of the aortic valve prosthesis. Aortic valve area, by VTI measures 1.46 cm. Aortic valve mean gradient measures 15.0 mmHg. Aortic valve Vmax measures 2.80 m/s.  8. The inferior vena cava is normal in size with <50% respiratory variability, suggesting right atrial pressure of 8 mmHg. FINDINGS  Left Ventricle: LVEF difficult to quantify due to beat to beat variability but likely is moderately reduced. Left ventricular ejection  fraction, by estimation, is 35 to 40%. The left ventricle has moderately decreased function. The left ventricle demonstrates global hypokinesis. The left ventricular internal cavity size was normal in size. There is moderate concentric left ventricular hypertrophy. Left ventricular diastolic function could not be evaluated due to atrial fibrillation. Left ventricular diastolic function could not be evaluated. Right Ventricle: The right ventricular size is mildly enlarged. No increase in  right ventricular wall thickness. Right ventricular systolic function is normal. There is severely elevated pulmonary artery systolic pressure. The tricuspid regurgitant velocity is 3.79 m/s, and with an assumed right atrial pressure of 8 mmHg, the estimated right ventricular systolic pressure is 65.5 mmHg. Left Atrium: Left atrial size was severely dilated. Right Atrium: Right atrial size was severely dilated. Pericardium: There is no evidence of pericardial effusion. Mitral Valve: The mitral valve is abnormal. Moderate to severe mitral annular calcification. Moderate to severe mitral valve regurgitation. Tricuspid Valve: The tricuspid valve is abnormal. Tricuspid valve regurgitation is moderate to severe. The flow in the hepatic veins is reversed during ventricular systole. Aortic Valve: The aortic valve has been repaired/replaced. Aortic valve regurgitation is trivial. Aortic valve mean gradient measures 15.0 mmHg. Aortic valve peak gradient measures 31.4 mmHg. Aortic valve area, by VTI measures 1.46 cm. There is a 23 mm Edwards Sapien 3 Ultra TAVR valve present in the aortic position. Procedure Date: 08/01/19. Echo findings are consistent with normal structure and function of the aortic valve prosthesis. Pulmonic Valve: The pulmonic valve was normal in structure. Pulmonic valve regurgitation is mild. Aorta: The aortic root and ascending aorta are structurally normal, with no evidence of dilitation. Venous: The inferior vena cava  is normal in size with less than 50% respiratory variability, suggesting right atrial pressure of 8 mmHg. IAS/Shunts: No atrial level shunt detected by color flow Doppler.  LEFT VENTRICLE PLAX 2D LVIDd:         5.00 cm LVIDs:         3.40 cm LV PW:         1.30 cm LV IVS:        1.30 cm LVOT diam:     2.20 cm LV SV:         64 LV SV Index:   38 LVOT Area:     3.80 cm  LV Volumes (MOD) LV vol d, MOD A2C: 134.0 ml LV vol d, MOD A4C: 116.0 ml LV vol s, MOD A2C: 73.9 ml LV vol s, MOD A4C: 79.3 ml LV SV MOD A2C:     60.1 ml LV SV MOD A4C:     116.0 ml LV SV MOD BP:      47.0 ml RIGHT VENTRICLE RV S prime:     9.68 cm/s TAPSE (M-mode): 1.8 cm LEFT ATRIUM             Index        RIGHT ATRIUM           Index LA diam:        5.50 cm 3.25 cm/m   RA Area:     23.40 cm LA Vol (A2C):   86.9 ml 51.40 ml/m  RA Volume:   78.80 ml  46.61 ml/m LA Vol (A4C):   78.4 ml 46.37 ml/m LA Biplane Vol: 88.4 ml 52.29 ml/m  AORTIC VALVE AV Area (Vmax):    1.52 cm AV Area (Vmean):   1.60 cm AV Area (VTI):     1.46 cm AV Vmax:           280.00 cm/s AV Vmean:          176.000 cm/s AV VTI:            0.438 m AV Peak Grad:      31.4 mmHg AV Mean Grad:      15.0 mmHg LVOT Vmax:         112.17 cm/s LVOT Vmean:  73.900 cm/s LVOT VTI:          0.168 m LVOT/AV VTI ratio: 0.38  AORTA Ao Root diam: 3.30 cm Ao Asc diam:  3.30 cm MR Peak grad:    100.8 mmHg   TRICUSPID VALVE MR Mean grad:    61.0 mmHg    TR Peak grad:   57.5 mmHg MR Vmax:         502.00 cm/s  TR Vmax:        379.00 cm/s MR Vmean:        371.0 cm/s MR PISA:         2.26 cm     SHUNTS MR PISA Eff ROA: 18 mm       Systemic VTI:  0.17 m MR PISA Radius:  0.60 cm      Systemic Diam: 2.20 cm Kardie Tobb DO Electronically signed by Thomasene Ripple DO Signature Date/Time: 06/08/2023/12:13:01 PM    Final    CT Angio Chest PE W/Cm &/Or Wo Cm Result Date: 06/07/2023 CLINICAL DATA:  Concern for pulmonary embolism. Short of breath. RIGHT leg pain. Productive cough EXAM: CT ANGIOGRAPHY  CHEST CT ABDOMEN AND PELVIS WITH CONTRAST TECHNIQUE: Multidetector CT imaging of the chest was performed using the standard protocol during bolus administration of intravenous contrast. Multiplanar CT image reconstructions and MIPs were obtained to evaluate the vascular anatomy. Multidetector CT imaging of the abdomen and pelvis was performed using the standard protocol during bolus administration of intravenous contrast. RADIATION DOSE REDUCTION: This exam was performed according to the departmental dose-optimization program which includes automated exposure control, adjustment of the mA and/or kV according to patient size and/or use of iterative reconstruction technique. CONTRAST:  75mL OMNIPAQUE IOHEXOL 350 MG/ML SOLN COMPARISON:  None Available. FINDINGS: CTA CHEST FINDINGS Cardiovascular: There is filling defect within subsegmental branches of the RIGHT lower lobe (image 81 through series 4). These distal segmental filling defects are occlusive. Probable filling defect within the medial branch of the LEFT lobe pulmonary artery (image 89/series 4. The overall clot burden is mild. No evidence of RIGHT ventricular strain. Upper lung lobes are clear. Mediastinum/Nodes: No axillary or supraclavicular adenopathy. No mediastinal or hilar adenopathy. No pericardial fluid. Esophagus normal. Lungs/Pleura: There is consolidation in the posterior RIGHT lower lobe. There is a moderate size RIGHT effusion. The volume of consolidation appears larger thanexpected from a pulmonary infarction. Favor pneumonia. Mild consolidation atelectasis in the LEFT lower lobe. Small LEFT effusion. Musculoskeletal: No aggressive osseous lesion. Review of the MIP images confirms the above findings. CT ABDOMEN and PELVIS FINDINGS Hepatobiliary: No focal hepatic lesion. Postcholecystectomy. No biliary dilatation. Pancreas: Pancreas is normal. No ductal dilatation. No pancreatic inflammation. Spleen: Normal spleen Adrenals/urinary tract: Adrenal  glands normal. The LEFT kidney is atrophic. Small calcification in the RIGHT kidney. Bilateral low-density renal cysts. Ureters and bladder normal. Stomach/Bowel: The stomach, duodenum, and small bowel normal. Multiple diverticula of the descending colon and sigmoid colon without acute inflammation. Vascular/Lymphatic: Abdominal aorta is normal caliber with atherosclerotic calcification. There is no retroperitoneal or periportal lymphadenopathy. No pelvic lymphadenopathy. Reproductive: Uterus and adnexa unremarkable. Other: No free fluid. Musculoskeletal: No aggressive osseous lesion. Review of the MIP images confirms the above findings. IMPRESSION: CHEST: 1. Bilateral lower lobe pulmonary emboli. Mild clot burden. No evidence of RIGHT ventricular strain. 2. Consolidation in the RIGHT lower lobe is favored pneumonia. 3. Moderate size RIGHT effusion. Small LEFT effusion. PELVIS: 1. No acute findings in the abdomen pelvis. 2. Atrophic LEFT kidney. 3.  Aortic Atherosclerosis (  ICD10-I70.0). Electronically Signed   By: Genevive Bi M.D.   On: 06/07/2023 17:43   CT ABDOMEN PELVIS W CONTRAST Result Date: 06/07/2023 CLINICAL DATA:  Concern for pulmonary embolism. Short of breath. RIGHT leg pain. Productive cough EXAM: CT ANGIOGRAPHY CHEST CT ABDOMEN AND PELVIS WITH CONTRAST TECHNIQUE: Multidetector CT imaging of the chest was performed using the standard protocol during bolus administration of intravenous contrast. Multiplanar CT image reconstructions and MIPs were obtained to evaluate the vascular anatomy. Multidetector CT imaging of the abdomen and pelvis was performed using the standard protocol during bolus administration of intravenous contrast. RADIATION DOSE REDUCTION: This exam was performed according to the departmental dose-optimization program which includes automated exposure control, adjustment of the mA and/or kV according to patient size and/or use of iterative reconstruction technique. CONTRAST:  75mL  OMNIPAQUE IOHEXOL 350 MG/ML SOLN COMPARISON:  None Available. FINDINGS: CTA CHEST FINDINGS Cardiovascular: There is filling defect within subsegmental branches of the RIGHT lower lobe (image 81 through series 4). These distal segmental filling defects are occlusive. Probable filling defect within the medial branch of the LEFT lobe pulmonary artery (image 89/series 4. The overall clot burden is mild. No evidence of RIGHT ventricular strain. Upper lung lobes are clear. Mediastinum/Nodes: No axillary or supraclavicular adenopathy. No mediastinal or hilar adenopathy. No pericardial fluid. Esophagus normal. Lungs/Pleura: There is consolidation in the posterior RIGHT lower lobe. There is a moderate size RIGHT effusion. The volume of consolidation appears larger thanexpected from a pulmonary infarction. Favor pneumonia. Mild consolidation atelectasis in the LEFT lower lobe. Small LEFT effusion. Musculoskeletal: No aggressive osseous lesion. Review of the MIP images confirms the above findings. CT ABDOMEN and PELVIS FINDINGS Hepatobiliary: No focal hepatic lesion. Postcholecystectomy. No biliary dilatation. Pancreas: Pancreas is normal. No ductal dilatation. No pancreatic inflammation. Spleen: Normal spleen Adrenals/urinary tract: Adrenal glands normal. The LEFT kidney is atrophic. Small calcification in the RIGHT kidney. Bilateral low-density renal cysts. Ureters and bladder normal. Stomach/Bowel: The stomach, duodenum, and small bowel normal. Multiple diverticula of the descending colon and sigmoid colon without acute inflammation. Vascular/Lymphatic: Abdominal aorta is normal caliber with atherosclerotic calcification. There is no retroperitoneal or periportal lymphadenopathy. No pelvic lymphadenopathy. Reproductive: Uterus and adnexa unremarkable. Other: No free fluid. Musculoskeletal: No aggressive osseous lesion. Review of the MIP images confirms the above findings. IMPRESSION: CHEST: 1. Bilateral lower lobe pulmonary  emboli. Mild clot burden. No evidence of RIGHT ventricular strain. 2. Consolidation in the RIGHT lower lobe is favored pneumonia. 3. Moderate size RIGHT effusion. Small LEFT effusion. PELVIS: 1. No acute findings in the abdomen pelvis. 2. Atrophic LEFT kidney. 3.  Aortic Atherosclerosis (ICD10-I70.0). Electronically Signed   By: Genevive Bi M.D.   On: 06/07/2023 17:43   DG Knee 2 Views Right Result Date: 06/07/2023 CLINICAL DATA:  Larey Seat, right leg pain EXAM: RIGHT KNEE - 1-2 VIEW COMPARISON:  07/07/2019 FINDINGS: Frontal and lateral views of the right knee are obtained. Partial visualization of intramedullary rod within the femur and tibia. No evidence of acute fracture, subluxation, or dislocation. There is mild osteoarthritis of the medial and patellofemoral compartments. No joint effusion. Mild diffuse subcutaneous edema. IMPRESSION: 1. Mild osteoarthritis. 2. No acute fracture. 3. Mild diffuse subcutaneous edema. Electronically Signed   By: Sharlet Salina M.D.   On: 06/07/2023 15:17   DG Hip Unilat W or Wo Pelvis 2-3 Views Right Result Date: 06/07/2023 CLINICAL DATA:  Larey Seat, right leg pain EXAM: DG HIP (WITH OR WITHOUT PELVIS) 2-3V RIGHT COMPARISON:  08/20/2009 FINDINGS: Frontal view of  the pelvis as well as frontal and cross-table lateral views of the right hip are obtained. There is a comminuted intertrochanteric right hip fracture, with impaction and varus angulation at the fracture site. No dislocation. Mild symmetrical bilateral hip osteoarthritis. The remainder of the bony pelvis is unremarkable. IMPRESSION: 1. Comminuted intertrochanteric right hip fracture, with impaction and varus angulation. Electronically Signed   By: Sharlet Salina M.D.   On: 06/07/2023 15:14   DG Chest 1 View Result Date: 06/07/2023 CLINICAL DATA:  Short of breath, right leg pain, fell 3 days ago, productive cough EXAM: CHEST  1 VIEW COMPARISON:  11/26/2018 FINDINGS: Single frontal view of the chest demonstrates marked  enlargement of the cardiac silhouette. Aortic valve prosthesis is noted. There is increased pulmonary vascular congestion. Veiling opacities at the lung bases, right greater than left, consistent with consolidation and/or effusions. No pneumothorax. No acute bony abnormalities. IMPRESSION: 1. Marked enlargement of the cardiac silhouette. 2. Pulmonary vascular congestion, with bibasilar veiling opacities consistent with consolidation and/or effusions. Findings could reflect sequela of congestive heart failure. Electronically Signed   By: Sharlet Salina M.D.   On: 06/07/2023 15:13    Patient Profile     Alexandra [name] is a 73 y.o. female with a history of non-obstructive coronary artery disease (cath 2019 - LAD 40%distal, RCA 30% prox), renal artery stenosis status post stent to right renal artery, lower extremity peripheral arterial disease status post stent placement to right common iliac artery, aortic valve stenosis status post TAVR, diastolic heart failure, pulmonary HTN, COPD. Patient admitted with fall and SOB.  Found to have R hip fracture. CT chest with small bilateral PE and RLL PNA/effusion. (569 -> 510 ->-> 526). Treated with heparin and abx. Placed on IV amio due to h/o atrial arrhythmias. Developed bradycardia and amio stopped.  PEA arrest on 3/16  Assessment & Plan   1. PEA arrest on 06/08/23 - multifactorial - s/p 26 mins CPR 3/16  2. Acute systolic HF -> cardiogenic shock  - EF newly down on echo (pre code) EF 30-35% - Remains intubated/sedated on NE 20 VP 0.04   - suspect metabolic stress. No clear ACS - EF and BP now dropping. POCUS echo LVEF 10% Agree with epi.  - Doubt she can survive this. Discuss GOC with family - For now continue aggressive support but no CPR/shocks in case of recurrent cardiac arrest - More family to arrive alter today  3. CAD with Elevated trop - suspect demand ischemia/PE (though PE very small and no evidence of RV strain) - Cath 2019 minimal CAD -  Doubt ACS - Will treat medically for now.  - ASA/statin.    4. Acute bilateral PE - small burden - continue heparin  5. Acute on chronic hypoxic respiratory failure with RLL PNA - continue diuresis/abx - CCM following  6. H/o aortic stenosis s/p TAVR - stable on echo   7. AKI  - Scr 1.8 -> 2.5 - Likely ATN due to shock  8. Mechanical fall with R hip fracture - currently in Buck's traction  9. DNR - no CPR or shock in case of recurrent arrest  She appears to be actively dying. Discussed situation with familly and CCM at bedside. Plan as above  CRITICAL CARE Performed by: Arvilla Meres  Total critical care time: 60 minutes  Critical care time was exclusive of separately billable procedures and treating other patients.  Critical care was necessary to treat or prevent imminent or life-threatening deterioration.  Critical care  was time spent personally by me (independent of midlevel providers or residents) on the following activities: development of treatment plan with patient and/or surrogate as well as nursing, discussions with consultants, evaluation of patient's response to treatment, examination of patient, obtaining history from patient or surrogate, ordering and performing treatments and interventions, ordering and review of laboratory studies, ordering and review of radiographic studies, pulse oximetry and re-evaluation of patient's condition.  For questions or updates, please contact [facility] Please consult [email or web address] for contact info under      Arvilla Meres, MD  , 8:43 AM

## 2023-06-24 DEATH — deceased
